# Patient Record
Sex: Male | Born: 1965 | Race: White | Hispanic: No | Marital: Married | Smoking: Former smoker
Health system: Southern US, Community
[De-identification: ages and names within clinical notes are randomized; demographics above are authoritative.]

## PROBLEM LIST (undated history)

## (undated) DIAGNOSIS — I639 Cerebral infarction, unspecified: Secondary | ICD-10-CM

## (undated) DIAGNOSIS — G459 Transient cerebral ischemic attack, unspecified: Secondary | ICD-10-CM

## (undated) DIAGNOSIS — K219 Gastro-esophageal reflux disease without esophagitis: Secondary | ICD-10-CM

## (undated) DIAGNOSIS — E669 Obesity, unspecified: Secondary | ICD-10-CM

## (undated) DIAGNOSIS — T4145XA Adverse effect of unspecified anesthetic, initial encounter: Secondary | ICD-10-CM

## (undated) DIAGNOSIS — F819 Developmental disorder of scholastic skills, unspecified: Secondary | ICD-10-CM

## (undated) DIAGNOSIS — K760 Fatty (change of) liver, not elsewhere classified: Secondary | ICD-10-CM

## (undated) DIAGNOSIS — Z9289 Personal history of other medical treatment: Secondary | ICD-10-CM

## (undated) DIAGNOSIS — F329 Major depressive disorder, single episode, unspecified: Secondary | ICD-10-CM

## (undated) DIAGNOSIS — E119 Type 2 diabetes mellitus without complications: Secondary | ICD-10-CM

## (undated) DIAGNOSIS — I219 Acute myocardial infarction, unspecified: Secondary | ICD-10-CM

## (undated) DIAGNOSIS — D510 Vitamin B12 deficiency anemia due to intrinsic factor deficiency: Secondary | ICD-10-CM

## (undated) DIAGNOSIS — Z9989 Dependence on other enabling machines and devices: Secondary | ICD-10-CM

## (undated) DIAGNOSIS — I739 Peripheral vascular disease, unspecified: Secondary | ICD-10-CM

## (undated) DIAGNOSIS — I10 Essential (primary) hypertension: Secondary | ICD-10-CM

## (undated) DIAGNOSIS — Z8739 Personal history of other diseases of the musculoskeletal system and connective tissue: Secondary | ICD-10-CM

## (undated) DIAGNOSIS — F419 Anxiety disorder, unspecified: Secondary | ICD-10-CM

## (undated) DIAGNOSIS — Z87438 Personal history of other diseases of male genital organs: Secondary | ICD-10-CM

## (undated) DIAGNOSIS — N281 Cyst of kidney, acquired: Secondary | ICD-10-CM

## (undated) DIAGNOSIS — M545 Low back pain, unspecified: Secondary | ICD-10-CM

## (undated) DIAGNOSIS — G8929 Other chronic pain: Secondary | ICD-10-CM

## (undated) DIAGNOSIS — T8859XA Other complications of anesthesia, initial encounter: Secondary | ICD-10-CM

## (undated) DIAGNOSIS — Z87442 Personal history of urinary calculi: Secondary | ICD-10-CM

## (undated) DIAGNOSIS — G43909 Migraine, unspecified, not intractable, without status migrainosus: Secondary | ICD-10-CM

## (undated) DIAGNOSIS — E66812 Obesity, class 2: Secondary | ICD-10-CM

## (undated) DIAGNOSIS — H547 Unspecified visual loss: Secondary | ICD-10-CM

## (undated) DIAGNOSIS — I82409 Acute embolism and thrombosis of unspecified deep veins of unspecified lower extremity: Secondary | ICD-10-CM

## (undated) DIAGNOSIS — E7800 Pure hypercholesterolemia, unspecified: Secondary | ICD-10-CM

## (undated) DIAGNOSIS — G4733 Obstructive sleep apnea (adult) (pediatric): Secondary | ICD-10-CM

## (undated) DIAGNOSIS — F32A Depression, unspecified: Secondary | ICD-10-CM

## (undated) DIAGNOSIS — M1990 Unspecified osteoarthritis, unspecified site: Secondary | ICD-10-CM

## (undated) DIAGNOSIS — I2699 Other pulmonary embolism without acute cor pulmonale: Secondary | ICD-10-CM

## (undated) DIAGNOSIS — I2510 Atherosclerotic heart disease of native coronary artery without angina pectoris: Secondary | ICD-10-CM

## (undated) HISTORY — DX: Essential (primary) hypertension: I10

## (undated) HISTORY — DX: Vitamin B12 deficiency anemia due to intrinsic factor deficiency: D51.0

## (undated) HISTORY — PX: INGUINAL HERNIA REPAIR: SUR1180

## (undated) HISTORY — PX: EYE SURGERY: SHX253

## (undated) HISTORY — DX: Personal history of other diseases of male genital organs: Z87.438

## (undated) HISTORY — DX: Fatty (change of) liver, not elsewhere classified: K76.0

## (undated) HISTORY — DX: Type 2 diabetes mellitus without complications: E11.9

## (undated) HISTORY — PX: UMBILICAL HERNIA REPAIR: SHX196

## (undated) HISTORY — DX: Obesity, unspecified: E66.9

## (undated) HISTORY — DX: Other pulmonary embolism without acute cor pulmonale: I26.99

## (undated) HISTORY — DX: Obesity, class 2: E66.812

## (undated) HISTORY — DX: Unspecified visual loss: H54.7

## (undated) HISTORY — PX: REATTACHMENT HAND: SHX5175

## (undated) HISTORY — DX: Anxiety disorder, unspecified: F41.9

## (undated) HISTORY — PX: CARDIAC CATHETERIZATION: SHX172

## (undated) HISTORY — DX: Cyst of kidney, acquired: N28.1

## (undated) HISTORY — DX: Depression, unspecified: F32.A

## (undated) HISTORY — DX: Developmental disorder of scholastic skills, unspecified: F81.9

## (undated) HISTORY — DX: Pure hypercholesterolemia, unspecified: E78.00

## (undated) HISTORY — DX: Atherosclerotic heart disease of native coronary artery without angina pectoris: I25.10

---

## 1998-05-04 ENCOUNTER — Emergency Department (HOSPITAL_COMMUNITY): Admission: EM | Admit: 1998-05-04 | Discharge: 1998-05-04 | Payer: Self-pay | Admitting: Emergency Medicine

## 1998-08-02 ENCOUNTER — Emergency Department (HOSPITAL_COMMUNITY): Admission: EM | Admit: 1998-08-02 | Discharge: 1998-08-02 | Payer: Self-pay | Admitting: Emergency Medicine

## 1998-08-02 ENCOUNTER — Encounter: Payer: Self-pay | Admitting: Emergency Medicine

## 1998-11-30 ENCOUNTER — Emergency Department (HOSPITAL_COMMUNITY): Admission: EM | Admit: 1998-11-30 | Discharge: 1998-11-30 | Payer: Self-pay | Admitting: Emergency Medicine

## 1998-11-30 ENCOUNTER — Encounter: Payer: Self-pay | Admitting: Emergency Medicine

## 1999-05-16 DIAGNOSIS — Z9289 Personal history of other medical treatment: Secondary | ICD-10-CM

## 1999-05-16 HISTORY — DX: Personal history of other medical treatment: Z92.89

## 2000-01-27 ENCOUNTER — Ambulatory Visit (HOSPITAL_BASED_OUTPATIENT_CLINIC_OR_DEPARTMENT_OTHER): Admission: RE | Admit: 2000-01-27 | Discharge: 2000-01-28 | Payer: Self-pay | Admitting: General Surgery

## 2000-01-27 ENCOUNTER — Encounter (INDEPENDENT_AMBULATORY_CARE_PROVIDER_SITE_OTHER): Payer: Self-pay

## 2001-07-09 ENCOUNTER — Encounter: Payer: Self-pay | Admitting: Orthopedic Surgery

## 2001-07-09 ENCOUNTER — Inpatient Hospital Stay (HOSPITAL_COMMUNITY): Admission: EM | Admit: 2001-07-09 | Discharge: 2001-07-14 | Payer: Self-pay | Admitting: Emergency Medicine

## 2001-07-09 ENCOUNTER — Encounter: Payer: Self-pay | Admitting: Emergency Medicine

## 2001-07-11 ENCOUNTER — Encounter: Payer: Self-pay | Admitting: Orthopedic Surgery

## 2002-05-30 ENCOUNTER — Encounter: Payer: Self-pay | Admitting: Emergency Medicine

## 2002-05-31 ENCOUNTER — Inpatient Hospital Stay (HOSPITAL_COMMUNITY): Admission: EM | Admit: 2002-05-31 | Discharge: 2002-06-03 | Payer: Self-pay | Admitting: Emergency Medicine

## 2004-03-03 ENCOUNTER — Emergency Department (HOSPITAL_COMMUNITY): Admission: EM | Admit: 2004-03-03 | Discharge: 2004-03-03 | Payer: Self-pay | Admitting: Emergency Medicine

## 2004-05-23 ENCOUNTER — Ambulatory Visit: Payer: Self-pay | Admitting: Internal Medicine

## 2004-07-11 ENCOUNTER — Ambulatory Visit: Payer: Self-pay | Admitting: Endocrinology

## 2004-07-12 ENCOUNTER — Ambulatory Visit: Payer: Self-pay | Admitting: Endocrinology

## 2004-07-20 ENCOUNTER — Ambulatory Visit: Payer: Self-pay | Admitting: Endocrinology

## 2005-02-27 ENCOUNTER — Ambulatory Visit: Payer: Self-pay | Admitting: Endocrinology

## 2005-03-06 ENCOUNTER — Ambulatory Visit (HOSPITAL_COMMUNITY): Admission: RE | Admit: 2005-03-06 | Discharge: 2005-03-06 | Payer: Self-pay | Admitting: Endocrinology

## 2005-03-06 ENCOUNTER — Ambulatory Visit: Payer: Self-pay | Admitting: Endocrinology

## 2005-04-07 ENCOUNTER — Ambulatory Visit: Payer: Self-pay | Admitting: Endocrinology

## 2005-05-05 ENCOUNTER — Ambulatory Visit: Payer: Self-pay | Admitting: Endocrinology

## 2005-06-05 ENCOUNTER — Ambulatory Visit: Payer: Self-pay | Admitting: Endocrinology

## 2005-07-06 ENCOUNTER — Ambulatory Visit: Payer: Self-pay | Admitting: Endocrinology

## 2005-08-03 ENCOUNTER — Ambulatory Visit: Payer: Self-pay | Admitting: Endocrinology

## 2005-09-04 ENCOUNTER — Ambulatory Visit: Payer: Self-pay | Admitting: Endocrinology

## 2005-10-04 ENCOUNTER — Ambulatory Visit: Payer: Self-pay | Admitting: Endocrinology

## 2005-10-12 ENCOUNTER — Ambulatory Visit: Payer: Self-pay | Admitting: Cardiology

## 2005-11-06 ENCOUNTER — Ambulatory Visit: Payer: Self-pay | Admitting: Endocrinology

## 2005-12-06 ENCOUNTER — Ambulatory Visit: Payer: Self-pay | Admitting: Endocrinology

## 2006-01-18 ENCOUNTER — Ambulatory Visit: Payer: Self-pay | Admitting: Endocrinology

## 2006-02-19 ENCOUNTER — Ambulatory Visit: Payer: Self-pay | Admitting: Endocrinology

## 2006-03-22 ENCOUNTER — Ambulatory Visit: Payer: Self-pay | Admitting: Endocrinology

## 2006-04-20 ENCOUNTER — Ambulatory Visit: Payer: Self-pay | Admitting: Endocrinology

## 2006-05-03 ENCOUNTER — Emergency Department (HOSPITAL_COMMUNITY): Admission: EM | Admit: 2006-05-03 | Discharge: 2006-05-03 | Payer: Self-pay | Admitting: Emergency Medicine

## 2006-05-04 ENCOUNTER — Ambulatory Visit: Payer: Self-pay | Admitting: Internal Medicine

## 2006-05-21 ENCOUNTER — Ambulatory Visit: Payer: Self-pay | Admitting: Endocrinology

## 2006-05-27 ENCOUNTER — Emergency Department (HOSPITAL_COMMUNITY): Admission: EM | Admit: 2006-05-27 | Discharge: 2006-05-27 | Payer: Self-pay | Admitting: Emergency Medicine

## 2006-06-21 ENCOUNTER — Ambulatory Visit: Payer: Self-pay | Admitting: Endocrinology

## 2006-07-24 ENCOUNTER — Ambulatory Visit: Payer: Self-pay | Admitting: Internal Medicine

## 2006-08-24 ENCOUNTER — Ambulatory Visit: Payer: Self-pay | Admitting: Endocrinology

## 2006-09-21 ENCOUNTER — Ambulatory Visit: Payer: Self-pay | Admitting: Endocrinology

## 2006-10-22 ENCOUNTER — Ambulatory Visit: Payer: Self-pay | Admitting: Endocrinology

## 2006-11-02 ENCOUNTER — Ambulatory Visit: Payer: Self-pay | Admitting: Internal Medicine

## 2006-11-21 ENCOUNTER — Ambulatory Visit: Payer: Self-pay | Admitting: Endocrinology

## 2006-12-05 DIAGNOSIS — I10 Essential (primary) hypertension: Secondary | ICD-10-CM

## 2006-12-05 DIAGNOSIS — I2510 Atherosclerotic heart disease of native coronary artery without angina pectoris: Secondary | ICD-10-CM

## 2006-12-05 DIAGNOSIS — E119 Type 2 diabetes mellitus without complications: Secondary | ICD-10-CM

## 2006-12-05 HISTORY — DX: Essential (primary) hypertension: I10

## 2006-12-05 HISTORY — DX: Type 2 diabetes mellitus without complications: E11.9

## 2006-12-05 HISTORY — DX: Atherosclerotic heart disease of native coronary artery without angina pectoris: I25.10

## 2006-12-21 ENCOUNTER — Ambulatory Visit: Payer: Self-pay | Admitting: Endocrinology

## 2007-01-28 ENCOUNTER — Ambulatory Visit: Payer: Self-pay | Admitting: Endocrinology

## 2007-02-27 ENCOUNTER — Ambulatory Visit: Payer: Self-pay | Admitting: Internal Medicine

## 2007-04-03 ENCOUNTER — Ambulatory Visit: Payer: Self-pay | Admitting: Endocrinology

## 2007-04-03 DIAGNOSIS — D510 Vitamin B12 deficiency anemia due to intrinsic factor deficiency: Secondary | ICD-10-CM

## 2007-04-03 HISTORY — DX: Vitamin B12 deficiency anemia due to intrinsic factor deficiency: D51.0

## 2007-04-12 ENCOUNTER — Emergency Department (HOSPITAL_COMMUNITY): Admission: EM | Admit: 2007-04-12 | Discharge: 2007-04-12 | Payer: Self-pay | Admitting: Emergency Medicine

## 2007-05-02 ENCOUNTER — Ambulatory Visit: Payer: Self-pay | Admitting: Endocrinology

## 2007-06-04 ENCOUNTER — Ambulatory Visit: Payer: Self-pay | Admitting: Endocrinology

## 2007-06-05 ENCOUNTER — Telehealth (INDEPENDENT_AMBULATORY_CARE_PROVIDER_SITE_OTHER): Payer: Self-pay | Admitting: *Deleted

## 2007-07-08 ENCOUNTER — Ambulatory Visit: Payer: Self-pay | Admitting: Endocrinology

## 2007-07-11 ENCOUNTER — Ambulatory Visit: Payer: Self-pay | Admitting: Endocrinology

## 2007-07-14 LAB — CONVERTED CEMR LAB
ALT: 20 units/L (ref 0–53)
AST: 15 units/L (ref 0–37)
Albumin: 3.9 g/dL (ref 3.5–5.2)
Alkaline Phosphatase: 46 units/L (ref 39–117)
BUN: 20 mg/dL (ref 6–23)
Basophils Absolute: 0.1 10*3/uL (ref 0.0–0.1)
Basophils Relative: 1.3 % — ABNORMAL HIGH (ref 0.0–1.0)
Bilirubin Urine: NEGATIVE
Bilirubin, Direct: 0.1 mg/dL (ref 0.0–0.3)
CO2: 30 meq/L (ref 19–32)
Calcium: 9.6 mg/dL (ref 8.4–10.5)
Chloride: 103 meq/L (ref 96–112)
Cholesterol: 105 mg/dL (ref 0–200)
Creatinine, Ser: 0.9 mg/dL (ref 0.4–1.5)
Eosinophils Absolute: 0.1 10*3/uL (ref 0.0–0.6)
Eosinophils Relative: 2.5 % (ref 0.0–5.0)
GFR calc Af Amer: 120 mL/min
GFR calc non Af Amer: 99 mL/min
Glucose, Bld: 114 mg/dL — ABNORMAL HIGH (ref 70–99)
HCT: 40 % (ref 39.0–52.0)
HDL: 42.9 mg/dL (ref >39.0)
Hemoglobin, Urine: NEGATIVE
Hemoglobin: 13.5 g/dL (ref 13.0–17.0)
Hgb A1c MFr Bld: 6.3 % — ABNORMAL HIGH (ref 4.6–6.0)
Ketones, ur: NEGATIVE mg/dL
LDL Cholesterol: 37 mg/dL (ref 0–99)
Leukocytes, UA: NEGATIVE
Lymphocytes Relative: 25.1 % (ref 12.0–46.0)
MCHC: 33.8 g/dL (ref 30.0–36.0)
MCV: 92.8 fL (ref 78.0–100.0)
Monocytes Absolute: 0.5 10*3/uL (ref 0.2–0.7)
Monocytes Relative: 8.6 % (ref 3.0–11.0)
Neutro Abs: 3.3 10*3/uL (ref 1.4–7.7)
Neutrophils Relative %: 62.5 % (ref 43.0–77.0)
Nitrite: NEGATIVE
Platelets: 317 10*3/uL (ref 150–400)
Potassium: 4.3 meq/L (ref 3.5–5.1)
RBC: 4.31 M/uL (ref 4.22–5.81)
RDW: 11.9 % (ref 11.5–14.6)
Sodium: 138 meq/L (ref 135–145)
Specific Gravity, Urine: >=1.03 (ref 1.000–1.03)
TSH: 1.28 microintl units/mL (ref 0.35–5.50)
Total Bilirubin: 1 mg/dL (ref 0.3–1.2)
Total CHOL/HDL Ratio: 2.4
Total Protein, Urine: NEGATIVE mg/dL
Total Protein: 7.1 g/dL (ref 6.0–8.3)
Triglycerides: 124 mg/dL (ref 0–149)
Urine Glucose: NEGATIVE mg/dL
Urobilinogen, UA: 0.2 (ref 0.0–1.0)
VLDL: 25 mg/dL (ref 0–40)
WBC: 5.4 10*3/uL (ref 4.5–10.5)
pH: 5.5 (ref 5.0–8.0)

## 2007-07-17 ENCOUNTER — Ambulatory Visit: Payer: Self-pay | Admitting: Endocrinology

## 2007-07-17 DIAGNOSIS — E7800 Pure hypercholesterolemia, unspecified: Secondary | ICD-10-CM | POA: Insufficient documentation

## 2007-07-17 HISTORY — DX: Pure hypercholesterolemia, unspecified: E78.00

## 2007-08-06 ENCOUNTER — Ambulatory Visit: Payer: Self-pay | Admitting: Endocrinology

## 2007-08-21 ENCOUNTER — Encounter (INDEPENDENT_AMBULATORY_CARE_PROVIDER_SITE_OTHER): Payer: Self-pay | Admitting: *Deleted

## 2007-09-06 ENCOUNTER — Ambulatory Visit: Payer: Self-pay | Admitting: Endocrinology

## 2008-06-12 ENCOUNTER — Ambulatory Visit: Payer: Self-pay | Admitting: Endocrinology

## 2008-06-16 ENCOUNTER — Encounter: Payer: Self-pay | Admitting: Endocrinology

## 2008-07-26 ENCOUNTER — Emergency Department (HOSPITAL_COMMUNITY): Admission: EM | Admit: 2008-07-26 | Discharge: 2008-07-26 | Payer: Self-pay | Admitting: Emergency Medicine

## 2008-09-13 ENCOUNTER — Telehealth: Payer: Self-pay | Admitting: Endocrinology

## 2008-11-26 ENCOUNTER — Telehealth: Payer: Self-pay | Admitting: Endocrinology

## 2008-12-17 ENCOUNTER — Ambulatory Visit: Payer: Self-pay | Admitting: Endocrinology

## 2008-12-17 LAB — CONVERTED CEMR LAB
Creatinine,U: 244.3 mg/dL
Hgb A1c MFr Bld: 6.2 % (ref 4.6–6.5)
Microalb Creat Ratio: 8.2 mg/g (ref 0.0–30.0)
Microalb, Ur: 2 mg/dL — ABNORMAL HIGH (ref 0.0–1.9)

## 2009-01-04 ENCOUNTER — Telehealth (INDEPENDENT_AMBULATORY_CARE_PROVIDER_SITE_OTHER): Payer: Self-pay | Admitting: *Deleted

## 2009-01-21 ENCOUNTER — Ambulatory Visit: Payer: Self-pay | Admitting: Endocrinology

## 2009-02-19 ENCOUNTER — Ambulatory Visit: Payer: Self-pay | Admitting: Endocrinology

## 2009-02-19 ENCOUNTER — Encounter: Payer: Self-pay | Admitting: Endocrinology

## 2009-02-19 DIAGNOSIS — H547 Unspecified visual loss: Secondary | ICD-10-CM | POA: Insufficient documentation

## 2009-02-19 HISTORY — DX: Unspecified visual loss: H54.7

## 2009-03-19 ENCOUNTER — Ambulatory Visit: Payer: Self-pay | Admitting: Endocrinology

## 2009-04-19 ENCOUNTER — Ambulatory Visit: Payer: Self-pay | Admitting: Endocrinology

## 2009-05-21 ENCOUNTER — Ambulatory Visit: Payer: Self-pay | Admitting: Endocrinology

## 2009-06-22 ENCOUNTER — Ambulatory Visit: Payer: Self-pay | Admitting: Endocrinology

## 2009-07-08 ENCOUNTER — Emergency Department (HOSPITAL_BASED_OUTPATIENT_CLINIC_OR_DEPARTMENT_OTHER): Admission: EM | Admit: 2009-07-08 | Discharge: 2009-07-08 | Payer: Self-pay | Admitting: Emergency Medicine

## 2009-07-20 ENCOUNTER — Ambulatory Visit: Payer: Self-pay | Admitting: Endocrinology

## 2009-07-20 ENCOUNTER — Telehealth: Payer: Self-pay | Admitting: Endocrinology

## 2009-08-18 ENCOUNTER — Ambulatory Visit: Payer: Self-pay | Admitting: Endocrinology

## 2009-09-15 ENCOUNTER — Ambulatory Visit: Payer: Self-pay | Admitting: Endocrinology

## 2009-10-15 ENCOUNTER — Ambulatory Visit: Payer: Self-pay | Admitting: Endocrinology

## 2010-04-18 ENCOUNTER — Telehealth (INDEPENDENT_AMBULATORY_CARE_PROVIDER_SITE_OTHER): Payer: Self-pay | Admitting: *Deleted

## 2010-06-08 ENCOUNTER — Encounter: Payer: Self-pay | Admitting: Endocrinology

## 2010-06-08 ENCOUNTER — Emergency Department (HOSPITAL_COMMUNITY)
Admission: EM | Admit: 2010-06-08 | Discharge: 2010-06-08 | Payer: Self-pay | Source: Home / Self Care | Admitting: Emergency Medicine

## 2010-06-08 LAB — GLUCOSE, CAPILLARY: Glucose-Capillary: 101 mg/dL — ABNORMAL HIGH (ref 70–99)

## 2010-06-10 ENCOUNTER — Inpatient Hospital Stay (HOSPITAL_COMMUNITY)
Admission: EM | Admit: 2010-06-10 | Discharge: 2010-06-11 | Payer: Self-pay | Source: Home / Self Care | Attending: Cardiovascular Disease | Admitting: Cardiovascular Disease

## 2010-06-10 ENCOUNTER — Encounter: Payer: Self-pay | Admitting: Endocrinology

## 2010-06-10 ENCOUNTER — Ambulatory Visit
Admission: RE | Admit: 2010-06-10 | Discharge: 2010-06-10 | Payer: Self-pay | Source: Home / Self Care | Attending: Endocrinology | Admitting: Endocrinology

## 2010-06-10 LAB — COMPREHENSIVE METABOLIC PANEL
ALT: 27 U/L (ref 0–53)
AST: 20 U/L (ref 0–37)
Albumin: 3.9 g/dL (ref 3.5–5.2)
Alkaline Phosphatase: 54 U/L (ref 39–117)
BUN: 25 mg/dL — ABNORMAL HIGH (ref 6–23)
CO2: 25 mEq/L (ref 19–32)
Calcium: 9.6 mg/dL (ref 8.4–10.5)
Chloride: 101 mEq/L (ref 96–112)
Creatinine, Ser: 0.99 mg/dL (ref 0.4–1.5)
GFR calc Af Amer: >60 mL/min (ref >60)
GFR calc non Af Amer: >60 mL/min (ref >60)
Glucose, Bld: 106 mg/dL — ABNORMAL HIGH (ref 70–99)
Potassium: 4.5 mEq/L (ref 3.5–5.1)
Sodium: 136 mEq/L (ref 135–145)
Total Bilirubin: 0.7 mg/dL (ref 0.3–1.2)
Total Protein: 7 g/dL (ref 6.0–8.3)

## 2010-06-10 LAB — CBC
HCT: 44.4 % (ref 39.0–52.0)
Hemoglobin: 14.9 g/dL (ref 13.0–17.0)
MCH: 30.9 pg (ref 26.0–34.0)
MCHC: 33.6 g/dL (ref 30.0–36.0)
MCV: 92.1 fL (ref 78.0–100.0)
Platelets: 298 10*3/uL (ref 150–400)
RBC: 4.82 MIL/uL (ref 4.22–5.81)
RDW: 13.2 % (ref 11.5–15.5)
WBC: 6.8 10*3/uL (ref 4.0–10.5)

## 2010-06-10 LAB — CARDIAC PANEL(CRET KIN+CKTOT+MB+TROPI)
CK, MB: 1 ng/mL (ref 0.3–4.0)
Relative Index: INVALID (ref 0.0–2.5)
Total CK: 71 U/L (ref 7–232)
Troponin I: <0.01 ng/mL (ref 0.00–0.06)

## 2010-06-10 LAB — MAGNESIUM: Magnesium: 1.9 mg/dL (ref 1.5–2.5)

## 2010-06-10 LAB — GLUCOSE, CAPILLARY
Glucose-Capillary: 112 mg/dL — ABNORMAL HIGH (ref 70–99)
Glucose-Capillary: 112 mg/dL — ABNORMAL HIGH (ref 70–99)
Glucose-Capillary: 191 mg/dL — ABNORMAL HIGH (ref 70–99)

## 2010-06-10 LAB — TSH: TSH: 2.059 u[IU]/mL (ref 0.350–4.500)

## 2010-06-10 LAB — PROTIME-INR
INR: 0.89 (ref 0.00–1.49)
Prothrombin Time: 12.2 seconds (ref 11.6–15.2)

## 2010-06-10 LAB — TROPONIN I: Troponin I: <0.01 ng/mL (ref 0.00–0.06)

## 2010-06-10 LAB — CK TOTAL AND CKMB (NOT AT ARMC)
CK, MB: 1 ng/mL (ref 0.3–4.0)
Relative Index: INVALID (ref 0.0–2.5)
Total CK: 69 U/L (ref 7–232)

## 2010-06-11 LAB — LIPID PANEL
Cholesterol: 205 mg/dL — ABNORMAL HIGH (ref 0–200)
HDL: 43 mg/dL (ref >39)
LDL Cholesterol: 120 mg/dL — ABNORMAL HIGH (ref 0–99)
Total CHOL/HDL Ratio: 4.8 RATIO
Triglycerides: 211 mg/dL — ABNORMAL HIGH (ref <150)
VLDL: 42 mg/dL — ABNORMAL HIGH (ref 0–40)

## 2010-06-11 LAB — GLUCOSE, CAPILLARY
Glucose-Capillary: 164 mg/dL — ABNORMAL HIGH (ref 70–99)
Glucose-Capillary: 178 mg/dL — ABNORMAL HIGH (ref 70–99)

## 2010-06-11 LAB — CARDIAC PANEL(CRET KIN+CKTOT+MB+TROPI)
CK, MB: 1 ng/mL (ref 0.3–4.0)
Relative Index: INVALID (ref 0.0–2.5)
Total CK: 16 U/L (ref 7–232)
Troponin I: 0.01 ng/mL (ref 0.00–0.06)

## 2010-06-12 LAB — CONVERTED CEMR LAB
ALT: 22 units/L (ref 0–53)
ALT: 26 units/L (ref 0–53)
AST: 17 units/L (ref 0–37)
AST: 20 units/L (ref 0–37)
Albumin: 3.8 g/dL (ref 3.5–5.2)
Albumin: 4 g/dL (ref 3.5–5.2)
Alkaline Phosphatase: 49 units/L (ref 39–117)
Alkaline Phosphatase: 53 units/L (ref 39–117)
BUN: 17 mg/dL (ref 6–23)
BUN: 17 mg/dL (ref 6–23)
Basophils Absolute: 0 10*3/uL (ref 0.0–0.1)
Basophils Absolute: 0.2 10*3/uL — ABNORMAL HIGH (ref 0.0–0.1)
Basophils Relative: 0.5 % (ref 0.0–3.0)
Basophils Relative: 2.2 % (ref 0.0–3.0)
Bilirubin Urine: NEGATIVE
Bilirubin, Direct: 0.1 mg/dL (ref 0.0–0.3)
Bilirubin, Direct: 0.2 mg/dL (ref 0.0–0.3)
CO2: 28 meq/L (ref 19–32)
CO2: 30 meq/L (ref 19–32)
Calcium: 9 mg/dL (ref 8.4–10.5)
Calcium: 9.6 mg/dL (ref 8.4–10.5)
Chloride: 102 meq/L (ref 96–112)
Chloride: 103 meq/L (ref 96–112)
Cholesterol: 124 mg/dL (ref 0–200)
Cholesterol: 129 mg/dL (ref 0–200)
Creatinine, Ser: 0.9 mg/dL (ref 0.4–1.5)
Creatinine, Ser: 0.9 mg/dL (ref 0.4–1.5)
Creatinine,U: 182.1 mg/dL
Creatinine,U: 187.6 mg/dL
Eosinophils Absolute: 0.2 10*3/uL (ref 0.0–0.7)
Eosinophils Absolute: 0.6 10*3/uL (ref 0.0–0.7)
Eosinophils Relative: 2.7 % (ref 0.0–5.0)
Eosinophils Relative: 7.9 % — ABNORMAL HIGH (ref 0.0–5.0)
Folate: 13.6 ng/mL
GFR calc Af Amer: 120 mL/min
GFR calc non Af Amer: 97.82 mL/min (ref >60)
GFR calc non Af Amer: 99 mL/min
Glucose, Bld: 109 mg/dL — ABNORMAL HIGH (ref 70–99)
Glucose, Bld: 127 mg/dL — ABNORMAL HIGH (ref 70–99)
HCT: 37.8 % — ABNORMAL LOW (ref 39.0–52.0)
HCT: 41.6 % (ref 39.0–52.0)
HDL: 42.3 mg/dL (ref >39.00)
HDL: 43.1 mg/dL (ref >39.0)
Hemoglobin, Urine: NEGATIVE
Hemoglobin: 13.1 g/dL (ref 13.0–17.0)
Hemoglobin: 14.5 g/dL (ref 13.0–17.0)
Hgb A1c MFr Bld: 6.1 % (ref 4.6–6.5)
Hgb A1c MFr Bld: 6.1 % — ABNORMAL HIGH (ref 4.6–6.0)
Iron: 82 ug/dL (ref 42–165)
Ketones, ur: NEGATIVE mg/dL
LDL Cholesterol: 44 mg/dL (ref 0–99)
LDL Cholesterol: 51 mg/dL (ref 0–99)
Leukocytes, UA: NEGATIVE
Lymphocytes Relative: 20.6 % (ref 12.0–46.0)
Lymphocytes Relative: 21.2 % (ref 12.0–46.0)
Lymphs Abs: 1.6 10*3/uL (ref 0.7–4.0)
MCHC: 34.7 g/dL (ref 30.0–36.0)
MCHC: 34.9 g/dL (ref 30.0–36.0)
MCV: 91.6 fL (ref 78.0–100.0)
MCV: 93.1 fL (ref 78.0–100.0)
Microalb Creat Ratio: 13.9 mg/g (ref 0.0–30.0)
Microalb Creat Ratio: 6.6 mg/g (ref 0.0–30.0)
Microalb, Ur: 1.2 mg/dL (ref 0.0–1.9)
Microalb, Ur: 2.6 mg/dL — ABNORMAL HIGH (ref 0.0–1.9)
Monocytes Absolute: 0.5 10*3/uL (ref 0.1–1.0)
Monocytes Absolute: 0.5 10*3/uL (ref 0.1–1.0)
Monocytes Relative: 6.8 % (ref 3.0–12.0)
Monocytes Relative: 7.4 % (ref 3.0–12.0)
Neutro Abs: 4.8 10*3/uL (ref 1.4–7.7)
Neutro Abs: 4.8 10*3/uL (ref 1.4–7.7)
Neutrophils Relative %: 63.6 % (ref 43.0–77.0)
Neutrophils Relative %: 67.1 % (ref 43.0–77.0)
Nitrite: NEGATIVE
PSA: 0.38 ng/mL (ref 0.10–4.00)
Platelets: 327 10*3/uL (ref 150–400)
Platelets: 328 10*3/uL (ref 150.0–400.0)
Potassium: 4.3 meq/L (ref 3.5–5.1)
Potassium: 4.3 meq/L (ref 3.5–5.1)
RBC: 4.07 M/uL — ABNORMAL LOW (ref 4.22–5.81)
RBC: 4.54 M/uL (ref 4.22–5.81)
RDW: 12.3 % (ref 11.5–14.6)
RDW: 13.7 % (ref 11.5–14.6)
Saturation Ratios: 22.1 % (ref 20.0–50.0)
Sodium: 137 meq/L (ref 135–145)
Sodium: 138 meq/L (ref 135–145)
Specific Gravity, Urine: >=1.03 (ref 1.000–1.030)
TSH: 0.97 microintl units/mL (ref 0.35–5.50)
TSH: 1.31 microintl units/mL (ref 0.35–5.50)
Total Bilirubin: 0.5 mg/dL (ref 0.3–1.2)
Total Bilirubin: 0.9 mg/dL (ref 0.3–1.2)
Total CHOL/HDL Ratio: 3
Total CHOL/HDL Ratio: 3
Total Protein, Urine: NEGATIVE mg/dL
Total Protein: 6.8 g/dL (ref 6.0–8.3)
Total Protein: 7.2 g/dL (ref 6.0–8.3)
Transferrin: 264.7 mg/dL (ref >=212.0)
Triglycerides: 177 mg/dL — ABNORMAL HIGH (ref 0–149)
Triglycerides: 189 mg/dL — ABNORMAL HIGH (ref 0.0–149.0)
Urine Glucose: NEGATIVE mg/dL
Urobilinogen, UA: 0.2 (ref 0.0–1.0)
VLDL: 35 mg/dL (ref 0–40)
VLDL: 37.8 mg/dL (ref 0.0–40.0)
Vitamin B-12: 639 pg/mL (ref 211–911)
WBC: 7.2 10*3/uL (ref 4.5–10.5)
WBC: 7.6 10*3/uL (ref 4.5–10.5)
pH: 5.5 (ref 5.0–8.0)

## 2010-06-14 NOTE — Assessment & Plan Note (Signed)
Summary: 1 MTH B12--PER PT--SAE---STC  Nurse Visit   Allergies: 1)  ! 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

## 2010-06-14 NOTE — Letter (Signed)
Summary: Primary Care Appointment Letter  [facility]  [address] [place], [place] [address]   Phone: [phone]  Fax: [phone]    08/21/2007 MRN: [id number]  Xavier [name] [address] [place], [place]  [address]  Dear [name],   Your Primary Care Physician  has indicated that:    _______it is time to schedule an appointment.    _______you missed your appointment on______ and need to call and          reschedule.    _______you need to have lab work done.    _______you need to schedule an appointment discuss lab or test results.    ___X____you need to call to reschedule your appointment that is                       scheduled on with Dr. Everardo All on October 15, 2007. Please call the office to reschedule.     Please call our office as soon as possible. Our phone number is [phone]. Please press option 1. Our office is open 8a-12noon and 1p-5p, Monday through Friday.     Thank you,    [facility] Scheduler

## 2010-06-14 NOTE — Assessment & Plan Note (Signed)
Summary: FU/ B-12 INJ /NWS  #   Vital Signs:  Patient profile:   45 year old male Height:      72 inches (182.88 cm) Weight:      279.38 pounds (126.99 kg) O2 Sat:      97 % on Room air Temp:     98.1 degrees F (36.72 degrees C) oral Pulse rate:   75 / minute BP sitting:   124 / 80  (left arm) Cuff size:   large  Vitals Entered By: Josph Macho RMA (August 18, 2009 8:12 AM)  O2 Flow:  Room air CC: Follow-up visit/ B12 today/ pt has questions about continuing Benicar/ pt states he is no longer taking Hyzaar/ CF Is Patient Diabetic? Yes   CC:  Follow-up visit/ B12 today/ pt has questions about continuing Benicar/ pt states he is no longer taking Hyzaar/ CF.  History of Present Illness: pt is here for medicare welllness visit.  he denies memory loss.  he says he is able to perform activities of daily living without assistance.  depression is well-controlled.   Current Medications (verified): 1)  Metformin Hcl 500 Mg Tb24 (Metformin Hcl) .... 2 Tabs Two Times A Day 2)  Actos 45 Mg  Tabs (Pioglitazone Hcl) .... Take 1 By Mouth Qd 3)  Cymbalta 60 Mg  Cpep (Duloxetine Hcl) .... Take 1 By Mouth Qd 4)  Diazepam 5 Mg  Tabs (Diazepam) .... Take 1 Three Times A Day Once Daily 5)  Vitamin B-12 Cr 1000 Mcg  Tbcr (Cyanocobalamin) .... Take 1 Injection Q Month 6)  Januvia 100 Mg  Tabs (Sitagliptin Phosphate) .... Qam 7)  Zocor 80 Mg  Tabs (Simvastatin) .... Qd 8)  Hyzaar 50-12.5 Mg Tabs (Losartan Potassium-Hctz) .... Qd  Allergies (verified): 1)  ! * Niaspan  Past History:  Past Medical History: Last updated: 12/05/2006 Coronary artery disease Diabetes mellitus, type II Hypertension learning disability premcious anemia repair left hand  Family History: Reviewed history from 07/17/2007 and no changes required. father died from lymphoma mother died from copd  Social History: Reviewed history from 07/17/2007 and no changes required. maried disabled due to left wrist injury  at work  Review of Systems  The patient denies fever, weight loss, weight gain, decreased hearing, chest pain, syncope, dyspnea on exertion, prolonged cough, headaches, abdominal pain, melena, hematochezia, severe indigestion/heartburn, hematuria, and suspicious skin lesions.         he has blurry vision (will have cataract surgery soon)  Physical Exam  General:  obese.   Head:  head: no deformity eyes: no periorbital swelling, no proptosis external nose and ears are normal mouth: no lesion seen Ears:  grossly normal hearing.   Neck:  Supple without thyroid enlargement or tenderness.  Lungs:  Clear to auscultation bilaterally. Normal respiratory effort.  Heart:  Regular rate and rhythm without murmurs or gallops noted. Normal S1,S2.   Abdomen:  abdomen is soft, nontender.  no hepatosplenomegaly.   not distended.  no hernia there are inguinal hernia scars Rectal:  normal external and internal exam.  heme neg  Prostate:  Normal size prostate without masses or tenderness.  Msk:  muscle bulk and strength are grossly normal.  no obvious joint swelling.  gait is normal and steady  Pulses:  dorsalis pedis intact bilat.  no carotid bruit Extremities:  no deformity.  no ulcer on the feet.  feet are of normal color and temp.  no edema there is a healed scar at the left  wrist Neurologic:  cn 2-12 grossly intact.   readily moves all 4's.   sensation is intact to touch on the feet  Skin:  normal texture and temp.  no rash.  not diaphoretic  Cervical Nodes:  No significant adenopathy.  Psych:  Alert and cooperative; normal mood and affect; normal attention span and concentration.     Impression & Recommendations:  Problem # 1:  ROUTINE GENERAL MEDICAL EXAM@HEALTH  CARE FACL (ICD-V70.0)  Medications Added to Medication List This Visit: 1)  Losartan Potassium 50 Mg Tabs (Losartan potassium) .Marland Kitchen.. 1 once daily  Other Orders: Vit B12 1000 mcg (J3420) Admin of Therapeutic Inj   intramuscular or subcutaneous ([address]) EKG w/ Interpretation (93000) TLB-Lipid Panel (80061-LIPID) TLB-BMP (Basic Metabolic Panel-BMET) (80048-METABOL) TLB-CBC Platelet - w/Differential (85025-CBCD) TLB-Hepatic/Liver Function Pnl (80076-HEPATIC) TLB-TSH (Thyroid Stimulating Hormone) (84443-TSH) TLB-A1C / Hgb A1C (Glycohemoglobin) (83036-A1C) TLB-Microalbumin/Creat Ratio, Urine (82043-MALB) TLB-PSA (Prostate Specific Antigen) (84153-PSA) TLB-Udip w/ Micro (81001-URINE) Initial Medicare Preventive Exam (U0454)  Patient Instructions: 1)  change hyzaar to losartan 50 mg once daily 2)  here are some samples of benicar 20 mg, to take once daily instead, to save money. 3)  please consider these measures for your health:  minimize alcohol.  do not use tobacco products. keep firearms safely stored.  always use seat belts.  have working smoke alarms in your home.  see the dentist regularly.  never drive under the influence of alcohol or drugs (including prescription drugs).  those with fair skin should take precautions against the sun. 4)  please let me know what your wishes would be, if artificial life support measures should become necessary.  it is critically important to prevent falling down (keep floor areas well-lit, dry, and free of loose objects). 5)  Please schedule a follow-up appointment in 6 months. 6)  tests are being ordered for you today.  a few days after the test(s), please call [phone] to hear your test results. 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)    Route: IM    Site: L deltoid    Exp Date: 11/12    Lot #: [id number]    Mfr: American Regent    Patient tolerated injection without complications    Given by: Josph Macho RMA (August 18, 2009 8:19 AM)  Orders Added: 1)  Vit B12 1000 mcg [J3420] 2)  Admin of Therapeutic Inj   intramuscular or subcutaneous [96372] 3)  EKG w/ Interpretation [93000] 4)  TLB-Lipid Panel [80061-LIPID] 5)  TLB-BMP (Basic Metabolic Panel-BMET) [80048-METABOL] 6)  TLB-CBC Platelet - w/Differential [85025-CBCD] 7)  TLB-Hepatic/Liver Function Pnl [80076-HEPATIC] 8)  TLB-TSH (Thyroid Stimulating Hormone) [84443-TSH] 9)  TLB-A1C / Hgb A1C (Glycohemoglobin) [83036-A1C] 10)  TLB-Microalbumin/Creat Ratio, Urine [82043-MALB] 11)  TLB-PSA (Prostate Specific Antigen) [84153-PSA] 12)  TLB-Udip w/ Micro [81001-URINE] 13)  Initial Medicare Preventive Exam [G0402]

## 2010-06-14 NOTE — Progress Notes (Signed)
Summary: med   Phone Note Outgoing Call   Call placed by: Rock Nephew CMA,  June 05, 2007 1:36 PM Call placed to: [employer] [phone] Summary of Call: Patient dropped off letter from [employer] stating that Diovan HCT HCT 80/12.5 is not covered on formulary. Called [facility] [place] spoke with Melissa who advised that Micardis HCT and Beicar HCT are preferred. Prior auth form also sent by insurance in case MD decide to proceed with prior auth.  Please advise if alt or prior auth shold be used Initial call taken by: Rock Nephew CMA,  June 05, 2007 2:08 PM  Follow-up for Phone Call        it appear already addressed previously per dr Everardo All that pt is not longer on the diovan hct 0 should be on micardis hct Follow-up by: Corwin Levins MD,  June 05, 2007 7:36 PM  Additional Follow-up for Phone Call Additional follow up Details #1::        called pt left msg on machine  to call caleed pt to inform Additional Follow-up by: Shelbie Proctor,  June 06, 2007 11:26 AM    Additional Follow-up for Phone Call Additional follow up Details #2::    pt need a micardis  hct  sent to the pharmacy  [facility]  [phone] .have the diovan medication  been changed per wife if so want the micardis sent to the pharmacy  Follow-up by: Shelbie Proctor,  June 06, 2007 9:47 AM  Additional Follow-up for Phone Call Additional follow up Details #3:: Details for Additional Follow-up Action Taken: I printed the micardis HCT rx.  no prior auth needed   Please be advised I will answer no further questions on this issue - any further questions will be held for dr Everardo All to address on his return Additional Follow-up by: Corwin Levins MD,  June 06, 2007 10:28 AM  New/Updated Medications: MICARDIS HCT 40-12.5 MG  TABS (TELMISARTAN-HCTZ) 1 by mouth qd   Prescriptions: MICARDIS HCT 40-12.5 MG  TABS (TELMISARTAN-HCTZ) 1 by mouth qd  #90 x 1   Entered and Authorized by:   Corwin Levins MD   Signed  by:   Corwin Levins MD on 06/06/2007   Method used:   Print then Give to Patient   RxID:   [id number]

## 2010-06-14 NOTE — Consult Note (Signed)
Summary: [facility]   Imported By: Sherian Rein 03/16/2009 21:30:86  _____________________________________________________________________  External Attachment:    Type:   Image     Comment:   External Document

## 2010-06-14 NOTE — Progress Notes (Signed)
Summary: Rx refill  Phone Note Call from Patient   Caller: Spouse Reason for Call: Refill Medication Summary of Call: Pt's wife called to have pt's Rx refilled: Januvia 100mg  and and Zocor 80 mg. Spoke with pt and informed him that rx were sent to pharmacy Initial call taken by: Beatriz Stallion,  January 04, 2009 2:53 PM

## 2010-06-14 NOTE — Assessment & Plan Note (Signed)
Summary: CPX/B-12 SHOT/AWARE OF FEE/NML   Vital Signs:  Patient Profile:   45 Years Old Male Weight:      274.6 pounds Temp:     97.3 degrees F oral Pulse rate:   68 / minute BP sitting:   123 / 86  (right arm) Cuff size:   large  Vitals Entered By: Orlan Leavens (July 17, 2007 11:07 AM)                 Visit Type:  Follow-up Visit  Chief Complaint:  CPX/ NEED TO DISCUSS VYTORIN MED.  History of Present Illness: feels well in general.  Does not smoke.  Seldom consumes alcohol.  takes 2 dm meds as rx'ed takes micardis hct as rx'ed insurance does not pay for vytorin       Current Allergies: ! * NIASPAN  Past Medical History:    Reviewed history from 12/05/2006 and no changes required:       Coronary artery disease       Diabetes mellitus, type II       Hypertension       learning disability       premcious anemia       repair left hand   Family History:    father died from lymphoma  Social History:    maried    disabled    Review of Systems       The patient complains of weight gain.  The patient denies fever and vision loss.     Physical Exam  General:     obese.   Neck:     no masses, thyromegaly, or abnormal cervical nodes Lungs:     clear to auscultation  Heart:     regular rate and rhythm, S1, S2 without murmurs, rubs, gallops, or clicks Pulses:     dorsalis pedis intact bilat.  no carotid bruit  Extremities:     no deformity.  no ulcer on the feet.  feet are of normal color and temp.  no edema  Neurologic:     sensation is intact to touch on the feet Skin:     normal texture and temp.  no rash.  not diaphoretic  Cervical Nodes:     no significant adenopathy Inguinal Nodes:     no significant adenopathy Psych:     alert and cooperative; normal mood and affect; normal attention span and concentration    Impression & Recommendations:  Problem # 1:  HYPERTENSION (ICD-401.9) well-controlled The following medications were  removed from the medication list:    Diovan Hct 80-12.5 Mg Tabs (Valsartan-hydrochlorothiazide)  His updated medication list for this problem includes:    Micardis Hct 40-12.5 Mg Tabs (Telmisartan-hctz) .Marland Kitchen... 1 by mouth qd   Problem # 2:  DIABETES MELLITUS, TYPE II (ICD-250.00) Assessment: Deteriorated  His updated medication list for this problem includes:    Metformin Hcl 500 Mg Tb24 (Metformin hcl)    Actos 45 Mg Tabs (Pioglitazone hcl) .Marland Kitchen... Take 1 by mouth qd    Januvia 100 Mg Tabs (Sitagliptin phosphate) ..... Qam  Orders: Est. Patient Level IV (60109) EKG w/ Interpretation (93000)   Problem # 3:  HYPERCHOLESTEROLEMIA (ICD-272.0) rx limited by financial factors The following medications were removed from the medication list:    Vytorin 10-80 Mg Tabs (Ezetimibe-simvastatin) .Marland Kitchen... Take 1 by mouth at bedtime need physical before addtional refills  His updated medication list for this problem includes:    Zocor 80 Mg Tabs (Simvastatin) .Marland KitchenMarland KitchenMarland KitchenMarland Kitchen  Qd   Medications Added to Medication List This Visit: 1)  Actos 45 Mg Tabs (Pioglitazone hcl) .... Take 1 by mouth qd 2)  Cymbalta 60 Mg Cpep (Duloxetine hcl) .... Take 1 by mouth qd 3)  Diazepam 5 Mg Tabs (Diazepam) .... Take 1 three times a day once daily 4)  Vitamin B-12 Cr 1000 Mcg Tbcr (Cyanocobalamin) .... Take 1 injection q month 5)  Januvia 100 Mg Tabs (Sitagliptin phosphate) .... Qam 6)  Zocor 80 Mg Tabs (Simvastatin) .... Qd   Patient Instructions: 1)  add januvia 100/d 2)  change vytorin to zocor 80/d 3)  ret 3 mos    Prescriptions: ZOCOR 80 MG  TABS (SIMVASTATIN) qd  #30 x 11   Entered and Authorized by:   Minus Breeding MD   Signed by:   Minus Breeding MD on 07/17/2007   Method used:   Elect[facility] to ...       [facility] [address] [phone]*       [address]       [place], [place]  [address]       Ph: [phone] or [phone]       Fax: [phone]   RxID:    [id number] JANUVIA 100 MG  TABS (SITAGLIPTIN PHOSPHATE) qam  #30 x 11   Entered and Authorized by:   Minus Breeding MD   Signed by:   Minus Breeding MD on 07/17/2007   Method used:   Electronically sent to ...       [facility] [address] [phone]*       [address]       [place], [place]  [address]       Ph: [phone] or [phone]       Fax: [phone]   RxID:   [id number]  ]

## 2010-06-14 NOTE — Medication Information (Signed)
Summary: Prior Authorization for Januvia/[employer]  Prior Authorization for Januvia/[employer]   Imported By: Esmeralda Links D'jimraou 06/18/2008 09:47:10  _____________________________________________________________________  External Attachment:    Type:   Image     Comment:   External Document

## 2010-06-14 NOTE — H&P (Signed)
NAMEDAVIED, NOCITO                 ACCOUNT NO.:  [id number]  MEDICAL RECORD NO.:  [id number]          PATIENT TYPE:  INP  LOCATION:  4740                         FACILITY:  [facility]  PHYSICIAN:  Verne Carrow, MDDATE OF BIRTH:  1966/01/24  DATE OF ADMISSION:  06/10/2010 DATE OF DISCHARGE:                             HISTORY & PHYSICAL   PRIMARY CARE PHYSICIAN:  Sean A. Everardo All, MD  HISTORY OF PRESENT ILLNESS:  Mr. Bromell is a pleasant 44 year old overweight white male with a history of diabetes mellitus, hyperlipidemia and hypertension who has mild coronary artery disease by calf in 2004.  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  He presented to see Dr. Everardo All today and had complaints of worsening shortness of breath with dyspnea on exertion and chest pain.  He had a severe episode of chest pain yesterday that wassubsternal and noted to be a tightness associated with shortness of breath, diaphoresis, and nausea.  The episode lasted for approximately 90 minutes.  He does note several episodes of chest pain per week over the last few months.  PAST MEDICAL HISTORY: 1. Diabetes mellitus x6 years. 2. Hypertension. 3. Hyperlipidemia. 4. Mild coronary artery disease by cath in 2004. 5. Pernicious anemia.  PAST SURGICAL HISTORY: 1. Hernia surgery. 2. Left hand trauma, status post amputation with reattachment.  ALLERGIES:  NIASPAN.  HOME MEDICATIONS: 1. Metformin 1000 mg b.i.d. 2. Actos 45 mg once daily. 3. Cymbalta 60 mg once daily. 4. Januvia 100 mg once daily. 5. Zocor 80 mg once daily. 6. Losartan 50 mg once daily. 7. Benicar 20 mg once daily. 8. Vitamin B6 100 mg once daily. 9. Aspirin 81 mg p.o. once daily.  SOCIAL HISTORY:  He is married, has two sons and is on disability because of this hand injury.  He denies use of tobacco, alcohol or illicit drugs.  FAMILY  HISTORY:  The patient's father had coronary artery disease and a prior bypass surgery.  His mother died from COPD.  REVIEW OF SYSTEMS:  As stated in the history of present illness and is otherwise negative.  PHYSICAL EXAMINATION:  VITAL SIGNS:  Temperature afebrile, pulse 76 and regular, respirations 12 and unlabored, blood pressure 134/76. GENERAL:  He is a pleasant overweight Caucasian male in no acute distress.  He is alert and oriented x3. NECK:  No JVD.  No carotid bruits.  No thyromegaly.  No lymphadenopathy. SKIN:  Warm and dry. MUSCULOSKELETAL:  Moves all extremities equally. PSYCHIATRIC:  Mood and affect are appropriate. HEENT:  Normal. LUNGS:  Clear to auscultation bilaterally without wheezes, rhonchi, or crackles noted. CARDIOVASCULAR:  Regular rate and rhythm without murmurs, gallops, or rubs noted. ABDOMEN:  Soft, nontender.  Bowel sounds are present. EXTREMITIES:  No evidence of edema.  Pulses are 2+ in all extremities.  DIAGNOSTIC STUDIES: 1. No laboratory values are available at this time. 2. A 12-lead EKG shows normal sinus rhythm with a rate of 81 beats per     minute.  In comparison to the old EKG, there are  now a new T-wave     inversion in the lateral and anterolateral leads.  ASSESSMENT AND PLAN:  This is a 45 year old obese white male with a history of diabetes mellitus, hypertension, hyperlipidemia and coronary artery disease by catheterization in 2004, who was admitted with chest pain is concerning for unstable angina.  In regards to his chest pain, we are going to admit him to the telemetry bed.  I have discussed a diagnostic left heart catheterization with the patient.  He is in agreement to proceed with a left heart catheterization.  Risk and benefits have been reviewed.  I would recommend going from the right groin as the patient has trauma to his left hand in the past and I would not risk functionality of his right hand with a catheterization  through the right radial artery.  We will check a metabolic profile, CBC, liver function tests, coagulation studies, and a TSH now.  We will cycle cardiac enzymes.  The plan would be for left heart catheterization later today.     Verne Carrow, MD     CM/MEDQ  D:  06/10/2010  T:  06/11/2010  Job:  [id number]  cc:   Gregary Signs A. Everardo All, MD  Electronically Signed by Verne Carrow MD on 06/14/2010 07:51:28 AM

## 2010-06-14 NOTE — Progress Notes (Signed)
Summary: BENICAR  Phone Note Call from Patient   Summary of Call: Pt was given samples of Benicar hct 20/12.5 at last office visit. He is req rx for same, [facility]. Also, pt does not know when is is to f/u again, please advise.  Initial call taken by: Lamar Sprinkles, CMA,  July 20, 2009 8:42 AM  Follow-up for Phone Call        f/u ov is due.  medicare now pays for "wellness"--why don't you make appointment for this soon?  i hope to give you "benicar" samples then. Follow-up by: Minus Breeding MD,  July 20, 2009 9:40 AM  Additional Follow-up for Phone Call Additional follow up Details #1::        pt informed, transferred to sch appt. Additional Follow-up by: Margaret Pyle, CMA,  July 20, 2009 10:32 AM

## 2010-06-14 NOTE — Miscellaneous (Signed)
  Medications Added MICARDIS HCT 40-12.5 MG  TABS (TELMISARTAN-HCTZ) qd       Clinical Lists Changes  Medications: Added new medication of MICARDIS HCT 40-12.5 MG  TABS (TELMISARTAN-HCTZ) qd changed diovan-hct to micardis-hct for insurance

## 2010-06-14 NOTE — Assessment & Plan Note (Signed)
Summary: B-12   SAE  /NWS  Nurse Visit    Prior Medications: MICARDIS HCT 40-12.5 MG  TABS (TELMISARTAN-HCTZ) qd DIOVAN HCT 80-12.5 MG TABS (VALSARTAN-HYDROCHLOROTHIAZIDE)  METFORMIN HCL 500 MG TB24 (METFORMIN HCL)  Current Allergies: ! 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

## 2010-06-14 NOTE — Assessment & Plan Note (Signed)
Summary: B12/SAE/CD  Nurse Visit   Allergies: 1)  ! 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

## 2010-06-14 NOTE — Assessment & Plan Note (Signed)
Summary: 1 MTH INJ-STC  Nurse Visit    Prior Medications: MICARDIS HCT 40-12.5 MG  TABS (TELMISARTAN-HCTZ) 1 by mouth qd DIOVAN HCT 80-12.5 MG TABS (VALSARTAN-HYDROCHLOROTHIAZIDE)  METFORMIN HCL 500 MG TB24 (METFORMIN HCL)  VYTORIN 10-80 MG  TABS (EZETIMIBE-SIMVASTATIN) TAKE 1 by mouth at bedtime NEED PHYSICAL BEFORE ADDTIONAL REFILLS Current Allergies: ! 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

## 2010-06-14 NOTE — Progress Notes (Signed)
----   Converted from flag ---- ---- 09/07/2008 12:43 PM, Ami Bullins CMA wrote: pt's number is disconnected.  ---- 09/07/2008 8:31 AM, Minus Breeding MD wrote: please advise pt that f/u ov is due ------------------------------

## 2010-06-14 NOTE — Progress Notes (Signed)
  Phone Note Other Incoming   Request: Send information Summary of Call: Request for records received from [facility]. Request forwarded to [employer].     

## 2010-06-14 NOTE — Assessment & Plan Note (Signed)
Summary: B-12 /SAE/NWS   Nurse Visit    Current Allergies: ! 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

## 2010-06-14 NOTE — Assessment & Plan Note (Signed)
Summary: b12/sae/cd  Nurse Visit   Allergies: 1)  ! 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 Patient Level I [09811]

## 2010-06-14 NOTE — Assessment & Plan Note (Signed)
Summary: B12-SAE-STC  Nurse Visit    Prior Medications: MICARDIS HCT 40-12.5 MG  TABS (TELMISARTAN-HCTZ) 1 by mouth qd METFORMIN HCL 500 MG TB24 (METFORMIN HCL)  ACTOS 45 MG  TABS (PIOGLITAZONE HCL) TAKE 1 by mouth QD CYMBALTA 60 MG  CPEP (DULOXETINE HCL) TAKE 1 by mouth QD DIAZEPAM 5 MG  TABS (DIAZEPAM) TAKE 1 three times a day once daily VITAMIN B-12 CR 1000 MCG  TBCR (CYANOCOBALAMIN) TAKE 1 INJECTION Q MONTH JANUVIA 100 MG  TABS (SITAGLIPTIN PHOSPHATE) qam ZOCOR 80 MG  TABS (SIMVASTATIN) qd Current Allergies: ! 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

## 2010-06-14 NOTE — Assessment & Plan Note (Signed)
Summary: b12/sae/cd  Nurse Visit   Allergies: 1)  ! 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

## 2010-06-14 NOTE — Assessment & Plan Note (Signed)
Summary: FU  $50 MEDS    STC   Vital Signs:  Patient Profile:   45 Years Old Male Weight:      267.2 pounds O2 Sat:      97 % O2 treatment:    Room Air Temp:     97.5 degrees F oral Pulse rate:   82 / minute BP sitting:   136 / 88  (right arm) Cuff size:   large  Pt. in pain?   no  Vitals Entered By: Orlan Leavens (June 12, 2008 7:58 AM)                  Chief Complaint:  FOLLOW-UP ON MEDS.  History of Present Illness: prefers generic of micardis-hct. takes 3 dm meds as rx'ed. doesn't take b-12 injections as rx'ed    Prior Medications Reviewed Using: List Brought by Patient  Prior Medication List:  MICARDIS HCT 40-12.5 MG  TABS (TELMISARTAN-HCTZ) 1 by mouth qd METFORMIN HCL 500 MG TB24 (METFORMIN HCL)  ACTOS 45 MG  TABS (PIOGLITAZONE HCL) TAKE 1 by mouth QD CYMBALTA 60 MG  CPEP (DULOXETINE HCL) TAKE 1 by mouth QD DIAZEPAM 5 MG  TABS (DIAZEPAM) TAKE 1 three times a day once daily VITAMIN B-12 CR 1000 MCG  TBCR (CYANOCOBALAMIN) TAKE 1 INJECTION Q MONTH JANUVIA 100 MG  TABS (SITAGLIPTIN PHOSPHATE) qam ZOCOR 80 MG  TABS (SIMVASTATIN) qd   Updated Prior Medication List: MICARDIS HCT 40-12.5 MG  TABS (TELMISARTAN-HCTZ) 1 by mouth qd METFORMIN HCL 500 MG TB24 (METFORMIN HCL)  ACTOS 45 MG  TABS (PIOGLITAZONE HCL) TAKE 1 by mouth QD CYMBALTA 60 MG  CPEP (DULOXETINE HCL) TAKE 1 by mouth QD DIAZEPAM 5 MG  TABS (DIAZEPAM) TAKE 1 three times a day once daily VITAMIN B-12 CR 1000 MCG  TBCR (CYANOCOBALAMIN) TAKE 1 INJECTION Q MONTH JANUVIA 100 MG  TABS (SITAGLIPTIN PHOSPHATE) qam ZOCOR 80 MG  TABS (SIMVASTATIN) qd  Current Allergies (reviewed today): ! * NIASPAN  Past Medical History:    Reviewed history from 12/05/2006 and no changes required:       Coronary artery disease       Diabetes mellitus, type II       Hypertension       learning disability       premcious anemia       repair left hand     Review of Systems  The patient denies weight loss and weight  gain.     Physical Exam  General:     obese.   Pulses:     dorsalis pedis intact bilat.   Extremities:     no deformity.  no ulcer on the feet.  feet are of normal color and temp.  no edema  Neurologic:     sensation is intact to touch on the feet  Additional Exam:     CHOLESTEROL               129 mg/dL                   9-147        TRIGLYCERIDES        [H]  177 mg/dL                   8-295    HDL                       62.1 mg/dL                  >  39.0   VLDL CHOLESTEROL          35 mg/dl                    1-61   LDL CHOLESTEROL           51 mg/dl                    0-96     WHITE CELL COUNT          7.2 K/uL                    4.5-10.5   RED CELL COUNT            4.54 Mil/uL                 4.22-5.81   HEMOGLOBIN                14.5 g/dL                   04.5-40.9   HEMATOCRIT                41.6 %                      39.0-52.0     PLATELET COUNT            327 K/uL                    150-400  SODIUM                    138 mEq/L                   135-145   POTASSIUM                 4.3 mEq/L                   3.5-5.1   CHLORIDE                  102 mEq/L                   96-112   CARBON DIOXIDE            30 mEq/L                    19-32   GLUCOSE              [H]  127 mg/dL                   81-19   BUN                       17 mg/dL                    1-47   CREATININE                0.9 mg/dL                   8.2-9.5   CALCIUM                   9.6 mg/dL                   6.2-13.0    TOTAL BILIRUBIN  0.9 mg/dL                   4.4-0.1   DIRECT BILIRUBIN          0.2 mg/dL                   0.2-7.2   ALKALINE PHOSPHATASE      53 U/L                      39-117   SGOT (AST)                17 U/L                      0-37   SGPT (ALT)                22 U/L                      0-53   TOTAL PROTEIN             7.2 g/dL                    5.3-6.6   ALBUMIN                   4.0 g/dL                    4.4-0.3  A1C%                 [H]  6.1 %                Impression & Recommendations:  Problem # 1:  DIABETES MELLITUS, TYPE II (ICD-250.00) well-controlled   Problem # 2:  HYPERCHOLESTEROLEMIA (ICD-272.0) well-controlled   Problem # 3:  HYPERTENSION (ICD-401.9) pt c/o the cost of meds  Problem # 4:  ANEMIA, 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 Qd  Other Orders: TLB-Hepatic/Liver Function Pnl (80076-HEPATIC)   Patient Instructions: 1)  change micardis-hct to hyzaar 50/12.5 1/d, as hyzaar will be generic soon 2)  i gave samples of benicar-hct 20/12.5 to take for now 3)  same rx for dm and lipids 4)  take b-12 as rx'ed 5)  ret 30 days   Prescriptions: JANUVIA 100 MG  TABS (SITAGLIPTIN PHOSPHATE) qam  #30 x 11   Entered and Authorized by:   Minus Breeding MD   Signed by:   Minus Breeding MD on 06/12/2008   Method used:   [facility]        [facility] [address] [phone]* (retail)       [address]       [place], [place]  [address]       Ph: [phone] or [phone]       Fax: [phone]   RxID:   [id number] ACTOS 45 MG  TABS (PIOGLITAZONE HCL) TAKE 1 by mouth QD  #30 x 11   Entered and Authorized by:   Minus Breeding MD   Signed by:   Minus Breeding MD on 06/12/2008   Method used:   [facility]ronically to        [facility] [address] [phone]* (retail)       [address]  [place], [place]  [address]       Ph: [phone] or [phone]       Fax: [phone]   RxID:   [id number]

## 2010-06-14 NOTE — Assessment & Plan Note (Signed)
Summary: FU  PER WIFE MED OUT  $50--STC   Vital Signs:  Patient profile:   45 year old male Height:      72 inches Weight:      261 pounds BMI:     35.53 O2 Sat:      98 % on Room air Temp:     97.2 degrees F oral Pulse rate:   70 / minute BP sitting:   118 / 78  (left arm) Cuff size:   large  Vitals Entered By: Ami Bullins CMA (December 17, 2008 8:27 AM)  O2 Flow:  Room air  CC: pt here for follow up office visit. Pt needs refill on actos/ ab   CC:  pt here for follow up office visit. Pt needs refill on actos/ ab.  History of Present Illness: no cbg record, but states cbg's are well-controlled. he takes hyzaar as rx'ed.  Current Medications (verified): 1)  Metformin Hcl 500 Mg Tb24 (Metformin Hcl) .... 2 Tabs Two Times A Day 2)  Actos 45 Mg  Tabs (Pioglitazone Hcl) .... Take 1 By Mouth Qd 3)  Cymbalta 60 Mg  Cpep (Duloxetine Hcl) .... Take 1 By Mouth Qd 4)  Diazepam 5 Mg  Tabs (Diazepam) .... Take 1 Three Times A Day Once Daily 5)  Vitamin B-12 Cr 1000 Mcg  Tbcr (Cyanocobalamin) .... Take 1 Injection Q Month 6)  Januvia 100 Mg  Tabs (Sitagliptin Phosphate) .... Qam 7)  Zocor 80 Mg  Tabs (Simvastatin) .... Qd 8)  Hyzaar 50-12.5 Mg Tabs (Losartan Potassium-Hctz) .... Qd  Allergies (verified): 1)  ! * Niaspan  Past History:  Past Medical History: Last updated: 12/05/2006 Coronary artery disease Diabetes mellitus, type II Hypertension learning disability premcious anemia repair left hand  Review of Systems  The patient denies weight loss and weight gain.    Physical Exam  General:  obese.  no distress  Neck:  Supple without thyroid enlargement or tenderness. No cervical lymphadenopathy, neck masses or tracheal deviation.  Lungs:  Clear to auscultation bilaterally. Normal respiratory effort.  Heart:  Regular rate and rhythm without murmurs or gallops noted. Normal S1,S2.   Additional Exam:  Hemoglobin A1C            6.2 %                        4.6-6.5 Microalbumin Ratio        8.2 mg/g    Impression & Recommendations:  Problem # 1:  DIABETES MELLITUS, TYPE II (ICD-250.00) well-controlled  Problem # 2:  HYPERTENSION (ICD-401.9) well-controlled  Other Orders: Tdap => 50yrs IM ([address]) Admin 1st Vaccine (60[address]) Admin of Therapeutic Inj  intramuscular or subcutaneous (09811) Vit B12 1000 mcg (J3420) TLB-A1C / Hgb A1C (Glycohemoglobin) (83036-A1C) TLB-Microalbumin/Creat Ratio, Urine (82043-MALB) Est. Patient Level III (91478)  Patient Instructions: 1)  we discussed the importance of diet and exercise therapy and the risks of diabetes.  you should see an eye doctor every year. 2)  it is very important to keep good control of blood pressure and cholesterol, especially in those with diabetes.  please discuss these with your doctor.  you should take an aspirin every day, unless you have been advised by a doctor not to. 3)  check your blood sugar 1 times a day.  vary the time of day when you check, between before the 3 meals, and at bedtime.  also check if you have symptoms of your blood  sugar being too high or too low.  please keep a record of the readings and bring it to your next appointment here.  please call us sooner if you are having low blood sugar episodes. 4)  same medications. 5)  tests are being ordered for you today.  a few days after the test(s), please call [phone] to hear your test results.  this is very important to do because the results may change the instructions you see here 6)  (update: i left message on phone-tree:  rx as we discussed) Prescriptions: ACTOS 45 MG  TABS (PIOGLITAZONE HCL) TAKE 1 by mouth QD  #30 x 11   Entered and Authorized by:   Minus Breeding MD   Signed by:   Minus Breeding MD on 12/17/2008   Method used:   [facility]ronically to        [facility] [address] [phone]* (retail)       [address]       [place], [place]  [address]       Ph: [phone] or [phone]        Fax: [phone]   RxID:   [id number]    Immunizations Administered:  Tetanus Vaccine:    Vaccine Type: Tdap    Site: right deltoid    Mfr: GlaxoSmithKline    Dose: 0.5 ml    Route: IM    Given by: Ami Bullins CMA    Exp. Date: 03/18/2011    Lot #: [id number]    VIS given: 04/02/07 version given December 17, 2008.    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 Patient Level III [63016]

## 2010-06-14 NOTE — Assessment & Plan Note (Signed)
Summary: B12/SAE/CD  Nurse Visit   Allergies: 1)  ! 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

## 2010-06-14 NOTE — Progress Notes (Signed)
Summary: refills  Phone Note Call from Patient Call back at [phone]   Caller: Spouse-Lisa Summary of Call: requesting refill of actos, januvia and simvastain, pt has appt on aug 6th Initial call taken by: Migdalia Dk,  November 26, 2008 10:40 AM  Follow-up for Phone Call        sent Follow-up by: Minus Breeding MD,  November 26, 2008 12:22 PM  Additional Follow-up for Phone Call Additional follow up Details #1::        Notified wife rx sent to pharm Additional Follow-up by: Orlan Leavens,  November 26, 2008 1:45 PM    Prescriptions: ZOCOR 80 MG  TABS (SIMVASTATIN) qd  #30 Tablet x 0   Entered and Authorized by:   Minus Breeding MD   Signed by:   Minus Breeding MD on 11/26/2008   Method used:   Electronically to        [facility] [address] [phone]* (retail)       [address]       [place], [place]  [address]       Ph: [phone] or [phone]       Fax: [phone]   RxID:   [id number] JANUVIA 100 MG  TABS (SITAGLIPTIN PHOSPHATE) qam  #30 x 0   Entered and Authorized by:   Minus Breeding MD   Signed by:   Minus Breeding MD on 11/26/2008   Method used:   Electronically to        [facility] [address] [phone]* (retail)       [address]       [place], [place]  [address]       Ph: [phone] or [phone]       Fax: [phone]   RxID:   [id number] ACTOS 45 MG  TABS (PIOGLITAZONE HCL) TAKE 1 by mouth QD  #30 x 0   Entered and Authorized by:   Minus Breeding MD   Signed by:   Minus Breeding MD on 11/26/2008   Method used:   Electronically to        [facility] [address] [phone]* (retail)       [address]       [place], [place]  [address]       Ph: [phone] or [phone]       Fax: [phone]   RxID:   [id number]

## 2010-06-14 NOTE — Assessment & Plan Note (Signed)
Summary: B-12   SAE   /NWS  Medications Added DIOVAN HCT 80-12.5 MG TABS (VALSARTAN-HYDROCHLOROTHIAZIDE)  METFORMIN HCL 500 MG TB24 (METFORMIN HCL)        Nurse Visit    Prior Medications: MICARDIS HCT 40-12.5 MG  TABS (TELMISARTAN-HCTZ) qd DIOVAN HCT 80-12.5 MG TABS (VALSARTAN-HYDROCHLOROTHIAZIDE)  METFORMIN HCL 500 MG TB24 (METFORMIN HCL)  Current Allergies: ! 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

## 2010-06-14 NOTE — Assessment & Plan Note (Signed)
Summary: B12/SAE/CD-PER PT RS-STC  Nurse Visit   Vital Signs:  Patient profile:   45 year old male Height:      72 inches  Vitals Entered By: Ami Bullins CMA (January 21, 2009 8:49 AM)  Allergies: 1)  ! 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

## 2010-06-14 NOTE — Assessment & Plan Note (Signed)
Summary: left eye pain/sae/cd   Vital Signs:  Patient profile:   45 year old male Height:      72 inches (182.88 cm) Weight:      268.25 pounds (121.93 kg) O2 Sat:      97 % on Room air Temp:     96.9 degrees F (36.06 degrees C) oral Pulse rate:   68 / minute BP sitting:   118 / 84  (left arm)  Vitals Entered By: Josph Macho CMA (February 19, 2009 9:19 AM)  O2 Flow:  Room air CC: Left eye blurry/ pt wants his B12 injection/ CF Is Patient Diabetic? Yes   CC:  Left eye blurry/ pt wants his B12 injection/ CF.  History of Present Illness: pt states 1 week of decreased vision from the left eye.  the right eye has normal vision.  says he does not remember the last time he saw an eye dr.  he does not have floaters or black spots.  Current Medications (verified): 1)  Metformin Hcl 500 Mg Tb24 (Metformin Hcl) .... 2 Tabs Two Times A Day 2)  Actos 45 Mg  Tabs (Pioglitazone Hcl) .... Take 1 By Mouth Qd 3)  Cymbalta 60 Mg  Cpep (Duloxetine Hcl) .... Take 1 By Mouth Qd 4)  Diazepam 5 Mg  Tabs (Diazepam) .... Take 1 Three Times A Day Once Daily 5)  Vitamin B-12 Cr 1000 Mcg  Tbcr (Cyanocobalamin) .... Take 1 Injection Q Month 6)  Januvia 100 Mg  Tabs (Sitagliptin Phosphate) .... Qam 7)  Zocor 80 Mg  Tabs (Simvastatin) .... Qd 8)  Hyzaar 50-12.5 Mg Tabs (Losartan Potassium-Hctz) .... Qd  Allergies (verified): 1)  ! * Niaspan  Past History:  Past Medical History: Last updated: 12/05/2006 Coronary artery disease Diabetes mellitus, type II Hypertension learning disability premcious anemia repair left hand  Review of Systems  The patient denies fever and headaches.         the right eye is not painful.  no diplopia  Physical Exam  General:  obese.  no distress  Eyes:  right eye:  Conjunctiva and sclera clear without injection, icterus or proptosis.  right fundoscopy is normal   Impression & Recommendations:  Problem # 1:  VISUAL ACUITY, DECREASED, RIGHT EYE  (ICD-369.9) uncertain etiology  Other Orders: Vit B12 1000 mcg (J3420) Admin of Therapeutic Inj  intramuscular or subcutaneous ([address]) Ophthalmology Referral (Ophthalmology) Est. 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 Patient Level III [09811]

## 2010-06-14 NOTE — Assessment & Plan Note (Signed)
Summary: B12 / SAE / NWS  Nurse Visit   Allergies: 1)  ! 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

## 2010-06-14 NOTE — Assessment & Plan Note (Signed)
Summary: 1 MTH B12 -SAE-STC  Nurse Visit    Prior Medications: MICARDIS HCT 40-12.5 MG  TABS (TELMISARTAN-HCTZ) 1 by mouth qd METFORMIN HCL 500 MG TB24 (METFORMIN HCL)  ACTOS 45 MG  TABS (PIOGLITAZONE HCL) TAKE 1 by mouth QD CYMBALTA 60 MG  CPEP (DULOXETINE HCL) TAKE 1 by mouth QD DIAZEPAM 5 MG  TABS (DIAZEPAM) TAKE 1 three times a day once daily VITAMIN B-12 CR 1000 MCG  TBCR (CYANOCOBALAMIN) TAKE 1 INJECTION Q MONTH JANUVIA 100 MG  TABS (SITAGLIPTIN PHOSPHATE) qam ZOCOR 80 MG  TABS (SIMVASTATIN) qd Current Allergies: ! 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

## 2010-06-16 NOTE — Procedures (Signed)
  [name], Xavier [name]                 ACCOUNT NO.:  [id number]  MEDICAL RECORD NO.:  [id number]           PATIENT TYPE:  LOCATION:                                 FACILITY:  PHYSICIAN:  Peter C. Eden Emms, MD, FACCDATE OF BIRTH:  June 21, 1965  DATE OF PROCEDURE: DATE OF DISCHARGE:                           CARDIAC CATHETERIZATION   This is a 45 year old patient with chest pain.  Cine-catheterization was done through right femoral artery.  A JL-4 catheter was used to engage the left main, JR-4 catheter was then used to engage the right.  Previous caths indicated difficulty engaging the left main; however, we were able to manipulate a standard catheter to engage it.  Left main coronary artery was normal.  Left anterior descending artery was normal in its proximal and midportion.  The distal vessel had a long area of tubular 30% stenosis. LAD was a large artery and wrapped the apex.  First diagonal branch was small with 20% multiple discrete lesions.  Second and third diagonal branch were larger and normal.  Circumflex coronary artery was nondominant and normal.  There was one large obtuse marginal branch which was normal.  The right coronary artery was dominant and normal.  RAO Ventriculography:  RAO ventriculography showed hyperdynamic LV function.  The EF was 65%.  There was no gradient across the aortic valve and no MR.  Aortic pressure was 140/88, LV pressure was 140/7 with a post A-wave EDP of 12.  IMPRESSION:  The patient's chest pain appears to be noncardiac in etiology, he has no significant coronary disease.  He tolerated the procedure well.  Due to the late hour, he will be admitted overnight and discharged in the morning so long as his groin heals well.     Xavier [name]. Eden Emms, MD, [facility]     PCN/MEDQ  D:  06/10/2010  T:  06/11/2010  Job:  [id number]  Electronically Signed by [name] MD [facility] on 06/16/2010 10:38:20 PM

## 2010-06-16 NOTE — Assessment & Plan Note (Signed)
Summary: FU ON BP / LABS? /NWS   Vital Signs:  Patient profile:   45 year old male Height:      72 inches (182.88 cm) Weight:      279 pounds (126.82 kg) BMI:     37.98 O2 Sat:      96 % on Room air Temp:     98.0 degrees F (36.67 degrees C) oral Pulse rate:   88 / minute BP sitting:   112 / 78  (left arm) Cuff size:   large  Vitals Entered By: Brenton Grills CMA Duncan Dull) (June 10, 2010 8:37 AM)  O2 Flow:  Room air CC: Elevated BP over past couple of weeks (wed. 220/117)/B12 shot today/aj Is Patient Diabetic? Yes Comments pt is no longer taking Losartan Postassium   CC:  Elevated BP over past couple of weeks (wed. 220/117)/B12 shot today/aj.  History of Present Illness: pt was seen in er for few weeks of severe headache, worst at the bifronal area, and assoc htn.  non-pulsatile quality.  no help with tylenol.  he has never had headache before.   no cbg record, but states cbg's are well-controlled.  he is overdue for f/u appt.   at the end of the ov, pt reports chest pain, sob, and heartburn.  chest pain has been intermittent, x a few months, with assoc sob.  last episode was yeaterday, x 90 minutes.  Current Medications (verified): 1)  Metformin Hcl 500 Mg Tb24 (Metformin Hcl) .... 2 Tabs Two Times A Day 2)  Actos 45 Mg  Tabs (Pioglitazone Hcl) .... Take 1 By Mouth Qd 3)  Cymbalta 60 Mg  Cpep (Duloxetine Hcl) .... Take 1 By Mouth Qd 4)  Diazepam 5 Mg  Tabs (Diazepam) .... Take 1 Three Times A Day Once Daily 5)  Vitamin B-12 Cr 1000 Mcg  Tbcr (Cyanocobalamin) .... Take 1 Injection Q Month 6)  Januvia 100 Mg  Tabs (Sitagliptin Phosphate) .... Qam 7)  Zocor 80 Mg  Tabs (Simvastatin) .... Qd 8)  Losartan Potassium 50 Mg Tabs (Losartan Potassium) .Marland Kitchen.. 1 Once Daily 9)  Benicar 20 Mg Tabs (Olmesartan Medoxomil) .Marland Kitchen.. 1 Tablet By Mouth Once Daily 10)  Vitamin B-6 100 Mg Tabs (Pyridoxine Hcl) .Marland Kitchen.. 1 Tablet By Mouth Once Daily 11)  Aspirin 81 Mg Tbec (Aspirin) .Marland Kitchen.. 1 Tablet By Mouth  Once Daily  Allergies (verified): 1)  ! * Niaspan  Past History:  Past Medical History: Last updated: 12/05/2006 Coronary artery disease Diabetes mellitus, type II Hypertension learning disability premcious anemia repair left hand  Review of Systems  The patient denies syncope and vision loss.    Physical Exam  General:  obese.  no distress  Lungs:  Clear to auscultation bilaterally. Normal respiratory effort.  Heart:  Regular rate and rhythm without murmurs or gallops noted. Normal S1,S2.   Pulses:  dorsalis pedis intact bilat.   Extremities:  no deformity.  no ulcer on the feet.  feet are of normal color and temp.  no edema. Neurologic:  sensation is intact to touch on the feet  Additional Exam:  ecg is noted (new changes since last year)   Impression & Recommendations:  Problem # 1:  CHEST PAIN UNSPECIFIED (ICD-786.50) hish risk for angina  Problem # 2:  HEADACHE (ICD-784.0) uncertain etiology  Problem # 3:  DIABETES MELLITUS, TYPE II (ICD-250.00) uncertain control  Medications Added to Medication List This Visit: 1)  Diazepam 5 Mg Tabs (Diazepam) .... Take 1 three times a day  once daily--dr kaur 2)  Benicar 20 Mg Tabs (Olmesartan medoxomil) .Marland Kitchen.. 1 tablet by mouth once daily 3)  Vitamin B-6 100 Mg Tabs (Pyridoxine hcl) .Marland Kitchen.. 1 tablet by mouth once daily 4)  Aspirin 81 Mg Tbec (Aspirin) .Marland Kitchen.. 1 tablet by mouth once daily  Other Orders: EKG w/ Interpretation (93000) Est. Patient Level IV (16109)  Patient Instructions: 1)  admit to [facility]--dr mcalhany. Prescriptions: LOSARTAN POTASSIUM 50 MG TABS (LOSARTAN POTASSIUM) 1 once daily  #30 x 11   Entered and Authorized by:   Minus Breeding MD   Signed by:   Minus Breeding MD on 06/10/2010   Method used:   [facility]        [facility] [address] [phone]* (retail)       [address]       [place], [place]  [address]       Ph: [phone] or [phone]       Fax:  [phone]   RxID:   [id number]    Orders Added: 1)  EKG w/ Interpretation [93000] 2)  Est. Patient Level IV [03474]  Appended Document: FU ON BP / LABS? /NWS     Allergies: 1)  ! * Niaspan   Other Orders: Admin of Therapeutic Inj  intramuscular or subcutaneous (25956) Vit B12 1000 mcg (J3420)   Medication Administration  Injection # 1:    Medication: Vit B12 1000 mcg    Diagnosis: ANEMIA, PERNICIOUS (ICD-281.0)    Route: IM    Site: R deltoid    Exp Date: 03/2012    Lot #: [id number]    Mfr: American Regent    Patient tolerated injection without complications    Given by: Brenton Grills CMA (AAMA) (June 10, 2010 10:01 AM)  Orders Added: 1)  Admin of Therapeutic Inj  intramuscular or subcutaneous [96372] 2)  Vit B12 1000 mcg [J3420]

## 2010-07-06 NOTE — Letter (Signed)
Summary: [facility]  [facility]   Imported By: Lester Franklinton 06/30/2010 09:35:22  _____________________________________________________________________  External Attachment:    Type:   Image     Comment:   External Document

## 2010-07-11 ENCOUNTER — Other Ambulatory Visit: Payer: Self-pay | Admitting: Endocrinology

## 2010-07-11 ENCOUNTER — Ambulatory Visit (INDEPENDENT_AMBULATORY_CARE_PROVIDER_SITE_OTHER): Payer: Medicare Other | Admitting: Endocrinology

## 2010-07-11 ENCOUNTER — Other Ambulatory Visit: Payer: Medicare Other

## 2010-07-11 ENCOUNTER — Encounter: Payer: Self-pay | Admitting: Endocrinology

## 2010-07-11 DIAGNOSIS — E119 Type 2 diabetes mellitus without complications: Secondary | ICD-10-CM

## 2010-07-11 DIAGNOSIS — Z79899 Other long term (current) drug therapy: Secondary | ICD-10-CM

## 2010-07-11 DIAGNOSIS — D510 Vitamin B12 deficiency anemia due to intrinsic factor deficiency: Secondary | ICD-10-CM

## 2010-07-11 DIAGNOSIS — I10 Essential (primary) hypertension: Secondary | ICD-10-CM

## 2010-07-11 DIAGNOSIS — E785 Hyperlipidemia, unspecified: Secondary | ICD-10-CM

## 2010-07-11 DIAGNOSIS — E7800 Pure hypercholesterolemia, unspecified: Secondary | ICD-10-CM

## 2010-07-11 LAB — BASIC METABOLIC PANEL
BUN: 18 mg/dL (ref 6–23)
CO2: 30 mEq/L (ref 19–32)
Calcium: 9.8 mg/dL (ref 8.4–10.5)
Chloride: 99 mEq/L (ref 96–112)
Creatinine, Ser: 0.9 mg/dL (ref 0.4–1.5)
GFR: 101.3 mL/min (ref >60.00)
Glucose, Bld: 111 mg/dL — ABNORMAL HIGH (ref 70–99)
Potassium: 4.9 mEq/L (ref 3.5–5.1)
Sodium: 136 mEq/L (ref 135–145)

## 2010-07-11 LAB — LIPID PANEL
Cholesterol: 120 mg/dL (ref 0–200)
HDL: 42.4 mg/dL (ref >39.00)
Total CHOL/HDL Ratio: 3
Triglycerides: 203 mg/dL — ABNORMAL HIGH (ref 0.0–149.0)
VLDL: 40.6 mg/dL — ABNORMAL HIGH (ref 0.0–40.0)

## 2010-07-11 LAB — HEPATIC FUNCTION PANEL
ALT: 23 U/L (ref 0–53)
AST: 17 U/L (ref 0–37)
Albumin: 4 g/dL (ref 3.5–5.2)
Alkaline Phosphatase: 50 U/L (ref 39–117)
Bilirubin, Direct: 0.1 mg/dL (ref 0.0–0.3)
Total Bilirubin: 0.8 mg/dL (ref 0.3–1.2)
Total Protein: 7 g/dL (ref 6.0–8.3)

## 2010-07-11 LAB — CBC WITH DIFFERENTIAL/PLATELET
Basophils Absolute: 0 10*3/uL (ref 0.0–0.1)
Basophils Relative: 0.6 % (ref 0.0–3.0)
Eosinophils Absolute: 0.4 10*3/uL (ref 0.0–0.7)
Eosinophils Relative: 5.7 % — ABNORMAL HIGH (ref 0.0–5.0)
HCT: 39.7 % (ref 39.0–52.0)
Hemoglobin: 13.9 g/dL (ref 13.0–17.0)
Lymphocytes Relative: 25.9 % (ref 12.0–46.0)
Lymphs Abs: 1.9 10*3/uL (ref 0.7–4.0)
MCHC: 34.9 g/dL (ref 30.0–36.0)
MCV: 92 fl (ref 78.0–100.0)
Monocytes Absolute: 0.7 10*3/uL (ref 0.1–1.0)
Monocytes Relative: 10.3 % (ref 3.0–12.0)
Neutro Abs: 4.1 10*3/uL (ref 1.4–7.7)
Neutrophils Relative %: 57.5 % (ref 43.0–77.0)
Platelets: 310 10*3/uL (ref 150.0–400.0)
RBC: 4.32 Mil/uL (ref 4.22–5.81)
RDW: 13.2 % (ref 11.5–14.6)
WBC: 7.2 10*3/uL (ref 4.5–10.5)

## 2010-07-11 LAB — URINALYSIS, ROUTINE W REFLEX MICROSCOPIC
Bilirubin Urine: NEGATIVE
Hgb urine dipstick: NEGATIVE
Ketones, ur: NEGATIVE
Leukocytes, UA: NEGATIVE
Nitrite: NEGATIVE
Specific Gravity, Urine: 1.025 (ref 1.000–1.030)
Total Protein, Urine: NEGATIVE
Urine Glucose: NEGATIVE
Urobilinogen, UA: 0.2 (ref 0.0–1.0)
pH: 6 (ref 5.0–8.0)

## 2010-07-11 LAB — LDL CHOLESTEROL, DIRECT: Direct LDL: 51.4 mg/dL

## 2010-07-11 LAB — TSH: TSH: 1.31 u[IU]/mL (ref 0.35–5.50)

## 2010-07-11 LAB — MICROALBUMIN / CREATININE URINE RATIO
Creatinine,U: 225.8 mg/dL
Microalb Creat Ratio: 1.6 mg/g (ref 0.0–30.0)
Microalb, Ur: 3.6 mg/dL — ABNORMAL HIGH (ref 0.0–1.9)

## 2010-07-11 LAB — HEMOGLOBIN A1C: Hgb A1c MFr Bld: 6.5 % (ref 4.6–6.5)

## 2010-07-22 NOTE — Discharge Summary (Signed)
Xavier [name], Xavier [name]                 ACCOUNT NO.:  [id number]  MEDICAL RECORD NO.:  [id number]          PATIENT TYPE:  INP  LOCATION:  4740                         FACILITY:  [facility]  PHYSICIAN:  Pricilla Riffle, MD, FACCDATE OF BIRTH:  1966/04/20  DATE OF ADMISSION:  06/10/2010 DATE OF DISCHARGE:  06/11/2010                              DISCHARGE SUMMARY   PROCEDURES: 1. Cardiac catheterization. 2. Coronary arteriogram. 3. Left ventriculogram.  PRIMARY FINAL DISCHARGE DIAGNOSES: 1. Chest pain, no significant coronary artery disease and at     catheterization. 2. Diabetes. 3. Hyperlipidemia. 4. Hypertension. 5. Status post bilateral inguinal hernia repair, left distal radius     and ulnar fracture requiring a stented surgery x2. 6. Obesity with body mass index of 35. 7. Pernicious anemia. 8. Allergy or intolerance to NIASPAN. 9. Family history of coronary artery disease in his father.  TIME AT DISCHARGE:  33 minutes.  HOSPITAL COURSE:  Xavier [name] is a 45 year old male with a history of nonobstructive coronary artery disease.  He had chest pain and came to the hospital where he was admitted for further evaluation.  His cardiac enzymes were negative for MI.  A lipid profile showed total cholesterol 205, triglycerides 211, HDL 43, and LDL 120.  He was taken to the Cath Lab on June 10, 2010, to define his anatomy.  Dr. Eden Emms cathed him and found normal coronary arteries with normal left ventricular function and an EF of 65%.  He was held overnight to make sure his renal function and blood sugars were controlled.  On June 11, 2010, he was seen by Dr. Tenny Craw.  There was concern for a GI origin for his symptoms, so he was started on an H2 blocker.  He was treated for his hyperlipidemia and hypertension.  He was evaluated by Dr. Tenny Craw and considered stable for discharge in improved condition, to follow up as an outpatient with primary care.  DISCHARGE INSTRUCTIONS: 1. His  activity level is to be increased gradually. 2. He is not to drive for 2 days and no lifting for a week. 3. He is to call our office for problems with the cath site. 4. He can shower. 5. He is to have no sexual activity for a week. 6. He is to follow up with Dr. Everardo All and call for an appointment. 7. He is to follow up with Dr. Daleen Squibb as needed.  DISCHARGE MEDICATIONS: 1. Tylenol Extra Strength 500 mg 2 tablets q.8 h. p.r.n. 2. Benicar HCT 20/12.5 daily. 3. Cymbalta 60 mg daily. 4. Valium 5 mg t.i.d. 5. Metformin 500 mg is on hold, restart on June 13, 2010. 6. Januvia 100 mg daily. 7. Crestor 10 mg daily. 8. Aspirin 81 mg daily. 9. Actos 45 mg daily. 10.Vitamin B12 1000 mcg daily. 11.Vitamin B6 daily.     [name], PA-C   ______________________________ Pricilla Riffle, MD, [facility]    RB/MEDQ  D:  06/11/2010  T:  06/12/2010  Job:  [id number]  cc:   Gregary Signs A. Everardo All, MD  Electronically Signed by [name] PA-C on 06/16/2010 01:26:44 PM Electronically Signed by Dietrich Pates  MD [facility] on 07/21/2010 02:10:46 PM

## 2010-07-26 NOTE — Assessment & Plan Note (Signed)
Summary: FU W/B12 INJ PER WIFE---D/T---STC   Vital Signs:  Patient profile:   45 year old male Height:      72 inches (182.88 cm) Weight:      275 pounds (125.00 kg) BMI:     37.43 O2 Sat:      97 % on Room air Temp:     98.1 degrees F (36.72 degrees C) oral Pulse rate:   83 / minute Pulse rhythm:   regular BP sitting:   102 / 68  (left arm) Cuff size:   large  Vitals Entered By: Brenton Grills CMA Duncan Dull) (July 11, 2010 10:48 AM)  O2 Flow:  Room air CC: Post Hosp F/U/discuss medications/aj Is Patient Diabetic? Yes   CC:  Post Hosp F/U/discuss medications/aj.  History of Present Illness: the status of at least 3 ongoing medical problems is addressed today: dm: pt states he feels well in general.  he was recently found to have noncardioganic chest pain.  he does not have a glucose meter. dyslipidemia:  no chest pain htn:  he takes and tolerates meds well.  no sob.  Current Medications (verified): 1)  Metformin Hcl 500 Mg Tb24 (Metformin Hcl) .... 2 Tabs Two Times A Day 2)  Actos 45 Mg  Tabs (Pioglitazone Hcl) .... Take 1 By Mouth Qd 3)  Cymbalta 60 Mg  Cpep (Duloxetine Hcl) .... Take 1 By Mouth Qd 4)  Diazepam 5 Mg  Tabs (Diazepam) .... Take 1 Three Times A Day Once Daily--Dr Evelene Croon 5)  Vitamin B-12 Cr 1000 Mcg  Tbcr (Cyanocobalamin) .... Take 1 Injection Q Month 6)  Januvia 100 Mg  Tabs (Sitagliptin Phosphate) .... Qam 7)  Zocor 80 Mg  Tabs (Simvastatin) .... Qd 8)  Losartan Potassium 50 Mg Tabs (Losartan Potassium) .Marland Kitchen.. 1 Once Daily 9)  Vitamin B-6 100 Mg Tabs (Pyridoxine Hcl) .Marland Kitchen.. 1 Tablet By Mouth Once Daily 10)  Aspirin 81 Mg Tbec (Aspirin) .Marland Kitchen.. 1 Tablet By Mouth Once Daily  Allergies (verified): 1)  ! * Niaspan  Past History:  Past Medical History: Last updated: 12/05/2006 Coronary artery disease Diabetes mellitus, type II Hypertension learning disability premcious anemia repair left hand  Review of Systems  The patient denies weight loss and weight  gain.         denies n/v  Physical Exam  General:  obese.  no distress  Pulses:  dorsalis pedis intact bilat.   Extremities:  no deformity.  no ulcer on the feet.  feet are of normal color and temp.  no edema. Neurologic:  sensation is intact to touch on the feet  Additional Exam:  Hemoglobin A1C            6.5 %  Cholesterol LDL      51.4 mg/dL    Impression & Recommendations:  Problem # 1:  DIABETES MELLITUS, TYPE II (ICD-250.00) well-controlled  Problem # 2:  HYPERCHOLESTEROLEMIA (ICD-272.0) well-controlled  Problem # 3:  HYPERTENSION (ICD-401.9) well-controlled  Medications Added to Medication List This Visit: 1)  Onetouch Ultra Blue Strp (Glucose blood) .... Once daily, and lancets 250.00  Other Orders: Vit B12 1000 mcg (J3420) Admin of Therapeutic Inj  intramuscular or subcutaneous (63875) TLB-Lipid Panel (80061-LIPID) TLB-BMP (Basic Metabolic Panel-BMET) (80048-METABOL) TLB-CBC Platelet - w/Differential (85025-CBCD) TLB-Hepatic/Liver Function Pnl (80076-HEPATIC) TLB-TSH (Thyroid Stimulating Hormone) (84443-TSH) TLB-A1C / Hgb A1C (Glycohemoglobin) (83036-A1C) TLB-Microalbumin/Creat Ratio, Urine (82043-MALB) TLB-Udip w/ Micro (81001-URINE) Est. Patient Level IV (64332)  Patient Instructions: 1)  Please schedule a "medicare wellness" appointment in  3 months. 2)  blood tests are being ordered for you today.  please call [phone] to hear your test results. 3)  here is a new blood-sugar meter.  check your blood sugar 1 time a day.  vary the time of day when you check, between before the 3 meals, and at bedtime.  also check if you have symptoms of your blood sugar being too high or too low.  please keep a record of the readings and bring it to your next appointment here.  please call us sooner if you are having low blood sugar episodes. 4)  (update: i left message on phone-tree:  rx as we discussed) Prescriptions: ONETOUCH ULTRA BLUE  STRP (GLUCOSE BLOOD) once daily,  and lancets 250.00  #50 x 8   Entered and Authorized by:   Minus Breeding MD   Signed by:   Minus Breeding MD on 07/11/2010   Method used:   [facility]        [facility] [phone][facility] (retail)       [address]       [place], [place]  [address]       Ph: [phone] or [phone]       Fax: [phone]   RxID:   [id number] LOSARTAN POTASSIUM 50 MG TABS (LOSARTAN POTASSIUM) 1 once daily  #30 x 11   Entered and Authorized by:   Minus Breeding MD   Signed by:   Minus Breeding MD on 07/11/2010   Method used:   [facility] to        [facility] [phone]* (retail)       [address]       [place], [place]  [address]       Ph: [phone] or [phone]       Fax: [phone]   RxID:   [id number] ZOCOR 80 MG  TABS (SIMVASTATIN) qd  #30 Tablet x 11   Entered and Authorized by:   Minus Breeding MD   Signed by:   Minus Breeding MD on 07/11/2010   Method used:   [facility] to        [facility] [phone]* (retail)       [address]       [place], [place]  [address]       Ph: [phone] or [phone]       Fax: [phone]   RxID:   [id number]    Medication Administration  Injection # 1:    Medication: Vit B12 1000 mcg    Diagnosis: ANEMIA, PERNICIOUS (ICD-281.0)    Route: IM    Site: R deltoid    Exp Date: 03/2012    Lot #: [id number]    Mfr: American Regent    Patient tolerated injection without complications    Given by: Orlan Leavens RMA (July 11, 2010 11:34 AM)  Orders Added: 1)  Vit B12 1000 mcg [J3420] 2)  Admin of Therapeutic Inj  intramuscular or subcutaneous [96372] 3)  TLB-Lipid Panel [80061-LIPID] 4)  TLB-BMP (Basic Metabolic Panel-BMET) [80048-METABOL] 5)  TLB-CBC Platelet - w/Differential [85025-CBCD] 6)  TLB-Hepatic/Liver Function Pnl [80076-HEPATIC] 7)  TLB-TSH (Thyroid Stimulating Hormone) [84443-TSH] 8)  TLB-A1C / Hgb A1C (Glycohemoglobin) [83036-A1C] 9)  TLB-Microalbumin/Creat Ratio, Urine [82043-MALB] 10)  TLB-Udip w/ Micro  [81001-URINE] 11)  Est. Patient Level IV [09983]

## 2010-08-05 LAB — GLUCOSE, CAPILLARY: Glucose-Capillary: 161 mg/dL — ABNORMAL HIGH (ref 70–99)

## 2010-08-09 ENCOUNTER — Ambulatory Visit (INDEPENDENT_AMBULATORY_CARE_PROVIDER_SITE_OTHER): Payer: Medicare Other | Admitting: Endocrinology

## 2010-08-09 DIAGNOSIS — D510 Vitamin B12 deficiency anemia due to intrinsic factor deficiency: Secondary | ICD-10-CM

## 2010-08-09 MED ORDER — CYANOCOBALAMIN 1000 MCG/ML IJ SOLN
1000.0000 ug | Freq: Once | INTRAMUSCULAR | Status: AC
  Administered 2010-08-09: 1000 ug via INTRAMUSCULAR

## 2010-09-07 ENCOUNTER — Ambulatory Visit (INDEPENDENT_AMBULATORY_CARE_PROVIDER_SITE_OTHER): Payer: Medicare Other | Admitting: *Deleted

## 2010-09-07 DIAGNOSIS — D510 Vitamin B12 deficiency anemia due to intrinsic factor deficiency: Secondary | ICD-10-CM

## 2010-09-07 MED ORDER — CYANOCOBALAMIN 1000 MCG/ML IJ SOLN
1000.0000 ug | Freq: Once | INTRAMUSCULAR | Status: AC
  Administered 2010-09-07: 1000 ug via INTRAMUSCULAR

## 2010-09-08 ENCOUNTER — Ambulatory Visit: Payer: Medicare Other

## 2010-09-21 ENCOUNTER — Ambulatory Visit: Payer: Medicare Other | Admitting: Endocrinology

## 2010-09-30 NOTE — Discharge Summary (Signed)
NAMEJAYLEN, KNOPE                             ACCOUNT NO.:  [id number]   MEDICAL RECORD NO.:  [id number]                   PATIENT TYPE:  OUT   LOCATION:  CATH                                 FACILITY:  [facility]   PHYSICIAN:  Thomas C. Wall, M.D. [facility]            DATE OF BIRTH:  [birth year]   DATE OF ADMISSION:  06/02/2002  DATE OF DISCHARGE:  06/03/2002                                 DISCHARGE SUMMARY   BRIEF HISTORY:  This is a 45 year old male with a positive family history  for early coronary artery disease, history of hypertension, diabetes,  obesity, and unknown lipid status who presented for evaluation of four-day  history of chest pain consistent with unstable angina.  The patient had  negative enzymes.  He had nonspecific ST changes.  He was seen by Dr. Daleen Squibb  and admitted for further evaluation.   PAST MEDICAL HISTORY:  Please see history as noted above.  The patient does  have a history of hypertension and diabetes.  He is overweight.   ALLERGIES:  The patient is allergic to BEE STINGS.  There are no known drug  allergies.   MEDICATIONS PRIOR TO ADMISSION:  1. Vioxx.  2. Hydrochlorothiazide.  3. Elavil.  4. Glucophage.  5. Accupril.   HOSPITAL COURSE:  As noted, this patient was admitted to [facility]  for further evaluation of chest pain.  He ruled out for an MI.  He was  scheduled for a cardiac catheterization.   The cardiac catheterization was performed June 02, 2002 by Dr. Riley Kill.  This showed a normal left ventricle.  He had a 30%-40% mid LAD lesion, as  well as a 20% proximal circumflex lesion.  Please see Dr. Rosalyn Charters complete  dictated report for all details.   During the patient's stay, he had a D-dimer that was negative.  He also had  a chest CT on May 31, 2002 that was negative for a pulmonary embolus.  Arrangements were made to discharge the patient on June 03, 2002.  His  Glucophage was discontinued secondary to his cardiac  catheterization.  He  was placed on Avandia for now.  He has requested followup with the [facility]  Internal Medicine practice and he was scheduled to see Dr. Romero Belling.   LABORATORY DATA:  CBC on the date of discharge revealed hemoglobin 15.1,  hematocrit 43, WBC 11,500, platelets 370,000.  Chemistries revealed a BUN of  21, creatinine 1.2, sodium 131, potassium 3.6, glucose 180.  A D-dimer is  0.30.  Cholesterol is 167, triglycerides 308, HDL 41, LDL 64.  Cardiac  enzymes were negative.  A TSH was 2.715.   A chest x-ray showed no acute abnormality.   DISCHARGE MEDICATIONS:  The patient was discharged on:  1. Avandia 4 mg daily.  2. Enteric-coated aspirin 81 mg daily.  3. Hydrochlorothiazide 25 mg 1/2 tablet daily.  4. Accupril 20 mg  daily.  5. Elavil 25 mg 1/2 to 1 tablet at bedtime.  6. Vioxx p.r.n.  7. K-Dur 20 mEq daily.  8. He was told not to take Glucophage for now.  9. He was told to take Tylenol as needed for pain.   ACTIVITY:  The patient was told to avoid any strenuous activity or driving  for two days.   DIET:  He was to be on a low-salt/low-fat diabetic diet.   DISCHARGE INSTRUCTIONS:  He was told to call the office if he had any  increased pain, swelling, or bleeding from his groin.  He was to have a  basic metabolic panel on his next office visit.  He was to see Dr. Vern Claude  physician assistant February 3 at noon.  He will see Dr. Everardo All Friday,  January 30, at 9 a.m.   PROBLEM LIST AT TIME OF DISCHARGE:  1. Chest pain, myocardial infarction ruled out.  2. Cardiac catheterization.  Please see results, as noted above.  Minimal     coronary artery disease with normal left ventricle.  3. Chest CT performed May 31, 2002 negative for pulmonary embolus.  4. Diabetes mellitus.  5. Obesity.  6. Positive family history of coronary artery disease.  7. Dyslipidemia, as noted.  8. History of left wrist surgery February 2003.  9. Mildly low potassium.     Delton See, P.A. [facility]                  Thomas C. Daleen Squibb, M.D. [facility]    DR/MEDQ  D:  06/03/2002  T:  06/03/2002  Job:  [id number]   cc:   [facility]  [address]  [place]  [place] [address]  Fax: [phone]   Sean A. Everardo All, M.D. [facility]

## 2010-09-30 NOTE — Op Note (Signed)
[place]. [facility]  Patient:    Xavier [name], Xavier [name] Visit Number: [id number] MRN: [id number]          Service Type: MED Location: 1800 1829 01 Attending Physician:  Hanley Seamen Dictated by:   Elisha Ponder, M.D. Proc. Date: 07/09/01 Admit Date:  07/09/2001   CC:         Sherri Rad, M.D.   Operative Report  DATE OF BIRTH:  [birth year]  PREOPERATIVE DIAGNOSIS:  Left comminuted complex interarticular open radius and ulnar fracture at a distal level with retained metal fragments in the soft tissues.  POSTOPERATIVE DIAGNOSIS:  Left comminuted complex interarticular open radius and ulnar fracture at a distal level with retained metal fragments in the soft tissues.  PROCEDURES: 1. Irrigation and debridement of the skin and subcutaneous tissue of bone and    tinnitus tissue, left open distal radius and ulnar fracture. 2. Exploration of ulnar nerve at the open area. 3. Removal of foreign body encased in the area about the sheath of the ulnar    nerve.  This was metallic foreign body localized on x-ray both    preoperatively and intraoperatively. 4. Open reduction and internal fixation with Kirschner wire fixation, left    radius fracture. 5. External fixation, left radius fracture. 6. Open reduction and internal fixation of left ulnar styloid fracture with    K-wire and tension band technique.  SURGEONS:  Elisha Ponder, M.D., Sherri Rad, M.D., and Ottie Glazier. Wynona Neat, P.A.-C. served as assistant for portions of the operation.  ANESTHESIA:  General.  TOURNIQUET TIME:  Just at an hour.  DRAINS:  None.  ESTIMATED BLOOD LOSS:  Minimal.  INDICATIONS FOR THE PROCEDURE:  This patient is a 45 year old male who underwent an on-the-job injury today.  He presented with the above-mentioned diagnosis.  He was seen and examined by Dr. Lestine Box.  This is detailed in his chart.  He understands the risks and benefits of surgery including the  risk of infection, bleeding, anesthesia, damage to normal structures after surgery. Explained were the accomplishments and intended goals of relieving symptoms and restoring function with surgery.  With this in mind, he desires to proceed.  He understands the risk of malunion, nonunion, dystrophic reaction, neurovascular injury, etc.  This is unfortunately a very complex comminuted interarticular fracture and has a propensity toward fragmentation, etc.  We have discussed with the patient all issues.  He understands that we will perform I&D and fixation as necessary.  The patient will be on antibiotics preoperatively and postoperatively secondary to the open nature of the injury.  OPERATIVE FINDINGS:  The patient had a complex comminuted interarticular radius fracture.  He underwent ORIF and external fixator application.  He also underwent foreign body removal, I&D of the open wound and a styloid ORIF. Stress radiography was performed during the operation and distal radioulnar joint was competent following the repairs.  PROCEDURE IN DETAIL:  The patient was seen by Dr. Lestine Box in the ER.  The risks and benefits were discussed.  He was sent to the operative suite and underwent prophylactic antibiotic administration.  Tetanus was noted to be up to date.  Once in the operative suite, he underwent a smooth induction of general anesthesia.  Following this, he was positioned supine and appropriately padded, and prepped and draped in the usual sterile fashion about the left upper extremity.  I should note that the patients neurovascular examination was somewhat difficult to obtain preoperatively but he  did have what appeared to be intact sensation roughly in the fingertips to light touch.  As mentioned, neurovascular examination was somewhat difficult due to pain and the fracture.  Once adequate field was available and sterile draping had ensued, the patient had I&D of the open wound.   Extension was made proximally and distally as well as a transverse extension.  The patient had the fracture site exposed about the ulnar styloid and soft tissues and the patient then underwent a copious amount of irrigant to the area.  Approximately 3 L of saline was placed in the wound and copiously lavaged.  The patient tolerated this well without problems.  There were no complications with this.  Once this was done, the patient had attention turned toward the radius.  The patient underwent application of a Synthes external fixator and an incision was made at the distal middle third of the arm and dissection between the ECRB and ECRL was carried out to the periosteum and the pronator teres identified and two half pins were inserted under radiographic images in usual fashion without difficulty.  Following this, the patient had the wound irrigated and closed with Prolene suture.  Once this was performed, the patient then underwent placement of two half pins distally.  This was done by making an incision about the radial aspect of the hand (dorsal radial).  The dorsal sensory branches were identified and protected.  Following this, the second metacarpal was identified and the patient underwent two half pins to be inserted at the second metacarpal.  One was performed at the base, and the other one just distal to it with the Synthes alignment guide.  Following this, the external fixator was applied, traction was placed and once this was done, fixation about the distal radius fracture was accomplished with Kirschner wire.  This entered at the radial styloid and engaged proximal ulnarly.  This provided adequate fixation.  However, I should note that the patient had a very highly comminuted, complex interarticular fracture and the bone was very comminuted. This was unfortunate for the patient but certainly the noted findings intraoperatively.  Once this was noted, the patient then had the  external fixator adjusted to place the lunate in the lunate fossa in slight  dorsiflexion.  The patient tolerated this well.  I was pleased with AP and lateral x-rays.  Overall, he had a good radial length, radial inclination was adequate, though there was still some comminution noted and a volar tilt was adequate.  Following this, the patient was turned back toward the open wound, transverse extension was made and the patient then underwent exploration of the ulnar nerve.  The ulnar nerve was explored and noted to be intact.  There was a large metallic body in the sheath of the ulnar nerve and this was very carefully dissected under 4.0 loop magnification.  It was removed and checked under radiograph to make sure it was completely gone.  It was completely gone and this was noted to be the case.  This was found to be satisfactory. Following this, the patient underwent ulnar styloid ORIF with tension band wire technique.  A K-wire was placed in the ulnar styloid tip engaged at the opposite cortex just slightly.  Following this, tension band wire was placed through drill holes distal approximately two fracture lengths distal to the fracture site and this tension band was then placed in figure-of-eight fashion to snug down the ulnar styloid fracture.  This was done to my satisfaction without difficulty.  Following this, periosteal tissue was repaired.  Further irrigation was applied, tourniquet was deflated, hemostasis was noted to be excellent and following this, the patient underwent wound closure with Prolene.  The patient tolerated this well.  The K-wires were clipped beneath the skin.  The patient had excellent refill and was then placed in a splint. Upon waking up, he had a fairly violent wake up and some blood loss coming from the proximal pins.  Thus, I took this down and examined it and there was no pulsatile bleeding or excessive hemorrhage and thus, I redressed this.  He will be  followed closely.  On examination in the recovery room, he was stable. We are going to have him seen by occupational therapy for edema control, massage, and range of motion actively and passively to the fingers.  We are going to place him on antibiotics due to the open nature of his injuries, and have appropriate pain medicine according to his needs.  We are going to watch his condition closely.  Postoperative CT scan will be performed to evaluate the articular surface.  This will be performed tomorrow morning and we will then review this.  I have discussed with the patient these issues.  All questions have been encouraged and answered. Dictated by:   Elisha Ponder, M.D. Attending Physician:  Hanley Seamen DD:  07/09/01 TD:  07/09/01 Job: [id number]

## 2010-09-30 NOTE — Op Note (Signed)
[name]. [facility]  Patient:    Xavier [name], Xavier [name] Visit Number: [id number] MRN: [id number]          Service Type: MED Location: 5000 5003 01 Attending Physician:  Sherri Rad Dictated by:   Dominica Severin III, M.D. Proc. Date: 07/11/01 Admit Date:  07/09/2001   CC:         Sherri Rad, M.D.   Operative Report  DATE OF BIRTH:  03/26/1966  PREOPERATIVE DIAGNOSES:  Status post left open distal radius and ulnar fracture treated with I&D, ex-fix, foreign body removal, etc. who now presents for definitive plate fixation volarly due to comminution and displacement.  POSTOPERATIVE DIAGNOSES:  Status post left open distal radius and ulnar fracture treated with I&D, ex-fix, foreign body removal, etc. who now presents for definitive plate fixation volarly due to comminution and displacement.  PROCEDURE:  1. Open reduction and internal fixation with fixed angled distal volar radius     plate, left radius.  2. Left carpal tunnel release.  3. Stress radiography, left wrist.  4. External fixator adjustor, left wrist.  SURGEON:  Aron Baba, M.D.  ASSISTANT:  Ralene Bathe, P.A.  COMPLICATIONS:  None.  ANESTHESIA:  General LMA anesthetic.  DRAINS:  One TLS drain was placed in the volar wound.  TOURNIQUET TIME:  Less than 90 minutes.  INDICATIONS FOR PROCEDURE:  This patient is a very pleasant 45 year old white male who was injured on the job and presents with the above mentioned injuries. The patient has undergone initial I&D, ex-fix, and pinning of the radius fracture. He also underwent ulnar fracture ORIF as well as ulnar nerve exploration and removal of foreign metallic object in the volar soft tissues adherent to the ulnar nerve sheath. The patient had CT scan showing the comminution and now presents for definitive ORIF of the radius fracture. The ulnar fracture has now been stabilized. I discussed with the patient the  risks and benefits of surgery including risk of infection, bleeding, anesthesia, damage to normal structures, failure of the surgery to accomplish its intended goals of relieving symptoms and restoring function. With this in mind, he desires to proceed. All questions have been encouraged and answered preoperatively.  OPERATIVE FINDINGS:  The patient had ORIF performed without difficulty. He tolerated the procedure well. He had excellent reduction and I was very pleased with his stability and findings. He did have a carpal tunnel release performed concomitantly as well as external fixator adjustment. I will remove the external fixator early and begin range of motion as I do feel he is an excellent candidate for this.  DESCRIPTION OF PROCEDURE:  The patient was seen by myself an anesthesia, permit was signed and he was taken to the operative suite. Prophylactic antibiotics were given. He underwent general LMA anesthetic with leg supine, appropriately padded and prepped and draped in the usual sterile fashion about the left upper extremity with Betadine scrub and paint. Once this was done, the patient had the external fixator loosened. I then placed traction on the wrist and tightened it back down. Following this, a volar approach to the radius was made. This was done by incising the skin and then the FCR sheath both palmarly and dorsally. FCR was retracted radially, carpal canal contents retracted ulnarly. The pronator quadratus was incised and retracted in a radial to ulnar fashion identifying the fracture. Once this was done, previous Kirschner wires were removed about the radius where they had been initially placed. Following this,  manipulative reduction was accomplished to my satisfaction and application of a distal volar radius plate from [employer] was applied. The patient had five screws placed proximally and four in the distal portion of the plate which engaged the fracture  fragments. I was able to introduce and take the fracture fragments nicely and provide excellent purchase of the screws. This was all done in a standard AO technique and in accordance with application procedures for the DVR plate. This is a fixed angled plate and allows the distal screws to screw into the plate thus preventing any dorsal collapse of the fractured fragments. The patients volar fracture fragment were corrected to anatomic reduction and I was very pleased with the joint surface as stress radiography was performed at multiple points throughout the case to ensure that the reduction was adequate and things looked well. This was done to my satisfaction. I left the most proximal two screw holes free as the patient had an external fixator and I did not want to place a permanent screw near the external fixator screws. The plate was placed without difficulty and to my satisfaction. I should note two Kirschner wires were removed from their previous placement during this. Following this, I performed stress radiography with the external fixator loosened completely and this showed stability and recreation of the anatomy nicely. This I do feel the patient will be a candidate for early external fixator removal. Following this, I performed a carpal tunnel release by incising the skin distal to the distal wrist crease along the radial border of the ring finger stopping at Hilgenreiners line. Following this, dissection was carried down to the palmar fascia which was split.  The carpal canal was then opened by incising the transverse carpal ligament. This was incised under direct 4.0 loop magnification. Distally and proximally, he was fully released without problems. The canal was quite tight, the median nerve was intact. I took care to release the ligament just off of the most ulnar aspect. Following this, the patient had the external fixator adjusted an additional time to make sure it was not  over distracted, that the lunate sat nicely in an extended position to  allow for maximal flexion pull-through of the tendons postoperatively. This was checked under x-ray. Following this, permanent copy x-rays were made, tourniquet deflated, hemostasis obtained and the wounds were then closed. The volar wound was closed with interrupted Vicryl suture over the pronator quadratus which was reattached nicely. Following this, 2-0 Vicryl was placed and the 3-0 Prolene was then placed in the skin edge. The dorsal radial incision made about the wrist which was approximately 1 cm in length was closed with three Prolenes. This was made during the course of removing the Kirschner wires. The carpal tunnel incision was closed with simple Prolene sutures of the 4-0 variety. All compartments were soft. I did release fascia during the approach. The patient had excellent refill and no significant bleeding after hemostasis was obtained. I was very pleased with the operation and the alignment and permanent copies were taken for documentation. Following this, the patient awoke from anesthesia and was transferred to the recovery room. We did place a sterile dressing and a volar plaster splint on the hand after Xeroform was applied and the drain was hooked up to suction. In the recovery room, he was stable, awake, alert and oriented and will be monitored closely.  We will continue pain management, IV antibiotics, OT for edema control, finger range of motion, etc. I have discussed all  issues with the family and will work diligently to try to restore this mans function. All questions have been encouraged and answered. Dictated by:   Dominica Severin III, M.D. Attending Physician:  Sherri Rad DD:  07/11/01 TD:  07/12/01 Job: [id number]

## 2010-09-30 NOTE — Consult Note (Signed)
[place]. [facility]  Patient:    Xavier [name], Xavier [name] Visit Number: [id number] MRN: [id number]          Service Type: MED Location: 1800 1829 01 Attending Physician:  Hanley Seamen Dictated by:   Sherri Rad, M.D. Admit Date:  07/09/2001                            Consultation Report  NO DICTATION Dictated by:   Sherri Rad, M.D. Attending Physician:  Hanley Seamen DD:  07/09/01 TD:  07/09/01 Job: [id number]

## 2010-09-30 NOTE — H&P (Signed)
NAME:  [name], [name]                        ACCOUNT NO.:  [id number]   MEDICAL RECORD NO.:  [id number]                   PATIENT TYPE:  INP   LOCATION:  0101                                 FACILITY:  [facility]   PHYSICIAN:  Jesse Sans. Wall, M.D. [facility]            DATE OF BIRTH:  1966/03/22   DATE OF ADMISSION:  05/30/2002  DATE OF DISCHARGE:                                HISTORY & PHYSICAL   CHIEF COMPLAINT:  Chest tightness and burning with hurting up in the neck  over the last 4-5 days.   HISTORY OF PRESENT ILLNESS:  [name] is a very pleasant 45 year old  married white male, son of a patient of mine, who comes in with the above  complaints. He had about five hours of pain today before he came to the  emergency room this evening. Cardiac risk factors include family history,  hypertension, diabetes mellitus type II, and obesity. He does not know his  lipid profile. He saw Dr. Donnetta Simpers recently who found his blood pressure to  be high. He was started on HCTZ. Apparently he had some nausea and vomiting  but it is not clear whether this was related to the medicine or his ongoing  problem. EKG in the emergency room showed ST segment flattening in the  lateral leads. Initial CPK and troponin were negative. Chest x-ray shows no  acute cardiopulmonary disease. Other lab data is unremarkable. He does have  an elevated blood sugar.   ALLERGIES:  No known drug allergies. He may be intolerant to HCTZ but this  is not clear.   CURRENT MEDICATIONS:  1. Accupril 20 mg per day.  2. Glucophage 850 mg twice a day.  3. HCTZ 25 mg per day.   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 He has a metal plate in place.  He is currently trying to get [employer] and is undergoing a law  suit for this. His other medical problems include obesity, hypertension, and  diabetes mellitus type II.   SOCIAL HISTORY:   He lives in [place] with his wife. He has two boys ages  57 years and 9 years. He is currently disabled. His wife does not work.   REVIEW OF SYSTEMS:  Unremarkable.   FAMILY HISTORY:  Positive for early family history of coronary artery  disease in his dad.   PHYSICAL EXAMINATION:  VITAL SIGNS: Blood pressure is currently 190/90. His  heart rate is 87 and regular. Respiratory rate 16. Temperature 96.7. O2 sat  is 90%.  GENERAL: No acute distress. He is very pleasant but anxious.  SKIN: Warm and dry.  HEENT: Examination unremarkable.  NECK: Carotid upstrokes equal without bruits. No jugular venous distention.  Thyroid non-palpable. Trachea is midline.  CHEST: Reveals an S4 at the apex. Normal split S2. No murmur.  LUNGS: Clear to auscultation and percussion.  ABDOMEN: Soft with good bowel sounds. No obvious tenderness and no  hepatosplenomegaly.  EXTREMITIES: No edema. Pulses brisk bilaterally. No dorsalis pedis or  posterior tibial. No sign of deep vein thrombosis.  NEURO: Examination intact.   LABORATORY DATA:  Creatinine 1.0, glucose 194, potassium 3.7, hemoglobin  14.8, platelet count 358. CPK 276, MB 1.1, troponin 0.02.   ASSESSMENT:  1. Rule out myocardial infarction.  2. Hypertension.  3. Diabetes mellitus type II.  4. Obesity.  5. Probable hyperlipidemia.  6. Positive family history of coronary artery disease.  7. History of traumatic left hand and arm injury, necessitating current     disability.   PLAN:  1. Telemetry.  2. Serial enzymes.  3. Check TSH and fasting lipids.  4. Aspirin, beta blocker, ace inhibitor, HCTZ for now, and Lovenox. Will try     a lower dose of HCTZ at 12.5 mg.  5. Increase Glucophage to 1,000 mg twice a day. Will hold on the day of the     catheterization.  6. Begin Avandia 4 mg per day.  7. Dietary consultation.  8. Cardiac catheterization on Monday, June 02, 2002.  9. Indications with risks and potential benefits have been  discussed with     the patient's wife. They agree to proceed.                                               Thomas C. Daleen Squibb, M.D. [facility]    TCW/MEDQ  D:  05/31/2002  T:  05/31/2002  Job:  [id number]   cc:   [facility]  [address]  [place]  [place] [address]  Fax: [phone]

## 2010-09-30 NOTE — Op Note (Signed)
[place]. [facility]  Patient:    Xavier [name], Xavier [name]                       MRN: [id number] Proc. Date: 01/27/00 Adm. Date:  [id number] Disc. Date: [id number] Attending:  Brandy Hale CC:         Dr. Louanna Raw   Operative Report  PREOPERATIVE DIAGNOSIS:  Bilateral inguinal hernias and umbilical hernia.  POSTOPERATIVE DIAGNOSIS:  Bilateral inguinal hernias and umbilical hernia.  OPERATION PERFORMED: 1. Repair of bilateral inguinal hernias with mesh. 2. Repair of umbilical hernia.  SURGEON:  Angelia Mould. Derrell Lolling, M.D.  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  He has noticed a bulge in his umbilicus for about four months.  He has noticed a bulge and some discomfort in his left groin and his right groin for a longer period of time.  The bulges in his groin have been enlarging.  On examination, the patient is somewhat overweight.  He has reducible umbilical hernia with a defect about 2 cm in diameter.  He has a very large left inguinal hernia and a medium-sized right inguinal hernia.  I cannot reduce the left inguinal hernia.  He was brought to the operating room for repair of all three hernias.  DESCRIPTION OF PROCEDURE:  Following the induction of general endotracheal anesthesia, the patients genitalia, groins and abdomen were prepped and draped in sterile fashion.  0.5% Marcaine with epinephrine was used as a local infiltation anesthetic.  An oblique incision was made in the left groin.  Dissection was carried down through the subcutaneous tissues.  External inguinal ring was identified.  The external oblique was incised in the direction of its fibers and retracted. The cord structures were quite large and bulky and took some time to dissect. Alternately, I was able to mobilize all of the cord structures and what turned out to be a huge indirect inguinal hernia.  Cremasteric muscle fibers  were divided which facilitated dissection.  We identified the hernia sac and dissected the vas deferens and testicular vessels away from the large hernia sac.  We stretched the internal inguinal ring somewhat and we were ultimately able to reduce very large hernia contents.  We opened the hernia sac and inspected it and found that there was no incarcerated component and no sliding component.  The sac was debrided back to the internal ring and then the sac was twisted and suture ligated with 2-0 silk suture ligature.  The excised sac was sent for pathology.  The floor of the inguinal canal was repaired and reinforced with an onlay graft of polypropylene mesh.  The mesh was cut in an appropriate shape about 3 inches by 6 inches and sutured in place with running sutures of 2-0 Prolene.  The mesh was sutured so as to generously overlap the fascia and pubic tubercle, along the inguinal ligament inferiorly, along the internal oblique fascia superiorly.  The mesh was incised laterally so as to wrap around the cord structures at the internal ring.  The tails of the mesh were overlapped laterally and suture lines completed.  A couple of extra sutures of Prolene were placed to be sure that the repair of the internal ring was tight enough.  The ilioinguinal nerve was preserved.  The wound was irrigated with saline.  The external oblique was closed with a running suture of 2-0 Vicryl placed in the cord  structures and ilioinguinal nerve deep to the external oblique.  Scarpas fascia was closed with 3-0 Vicryl and the skin closed with skin staples and Steri-Strips.  A similar mirror image oblique incision was made in the right groin. Dissection was carried down with identical exposure.  We found that he had a medium sized direct inguinal hernia on the right side.  We dissected out the cord structures very carefully.  We found that he had a small lipoma of the cord and that was excised.  He did not an  indirect hernia.  The floor of the canal was repaired and reinforced in a similar fashion with an onlay patch of polypropylene mesh.  Again, a 3 inch x 6 inch piece was used to cover all the way from the pubic tubercle out laterally to well lateral to the internal ring.  The mesh was sutured in place with running sutures of 2-0 Prolene with an identical repair as described on the left side.  Wound closure was identical to the left side.  We then made a transverse curved incision below the umbilicus.  Dissection was carried down into subcutaneous tissues.  We dissected out a very large umbilical hernia sac which had some omentum in it which was reduced back into the abdominal cavity.  We debrided the hernia sac all the way back to the fascia.  The defect in the fascia was about 2.0 cm in diameter.  We undermined the subcutaneous tissue circumferentially to facilitate suture placement and closure.  The defect at the umbilicus was closed with three interrupted vest over pants sutures of 0 Novofil.  These sutures were individually placed and then tied.  This overlapped the fascia quite well.  The fascia was quite thick and this appeared to be a secure closure.  The wound was irrigated with saline.  The umbilicus was tacked back down to the fascia with a 3-0 Vicryl suture.  The subcutaneous tissues were closed with 3-0 Vicryl sutures and the skin closed with a running subcuticular suture of 4-0 Vicryl and Steri-Strips. Clean bandages were placed on all of the incisions and the patient taken to the recovery room in stable condition.  Estimated blood loss was about 30 to 40 cc.  Complications were none.  Sponge, needle and instrument counts were correct. DD:  01/27/00 TD:  01/29/00 Job: [id number]

## 2010-09-30 NOTE — Consult Note (Signed)
[place]. [facility]  Patient:    Xavier [name], Xavier [name] Visit Number: [id number] MRN: [id number]          Service Type: MED Location: [phone] Attending Physician:  Sherri Rad Dictated by:   Sherri Rad, M.D. Proc. Date: 07/09/01 Admit Date:  07/09/2001 Discharge Date: 07/14/2001   CC:         Elisha Ponder, M.D.   Consultation Report  CHIEF COMPLAINT:  Left wrist pain.  HISTORY OF PRESENT ILLNESS:  This is a 45 year old right hand dominant gentlemen who while at work today at approximately 7:45 a.m. had freight fall onto his left wrist. He had immediate pain and swelling. He had a wound on the ulnar aspect of his left wrist as well. He was then taken to [facility] ER where x-rays were obtained and I was consulted for further evaluation and treatment. He states that he had transient numbness in his left hand and this resolved once he was brought to [facility] ER. He had no other areas of pain and no other complaints other than his left wrist.  PHYSICAL EXAMINATION:  LEFT WRIST:  He appears to have an open fracture involving the ulnar aspect of his left wrist which communicates to bone. This was not probed in the ER. A sterile dressing was applied as well as a Bohler splint. He had a palpable radial pulse. This sensation was intact by touch over all fingers as well. Active range of motion of his fingers did not cause any forearm or hand pain, just wrist pain. His compartments are soft in both his hand and his forearm. He had no other areas of tenderness.  X-RAYS:  X-rays revealed a comminuted left distal radius fracture, intra-articular with an ulnar styloid fracture, which communicated and was open. He also had a foreign body that was seen in the volar ulnar aspect of his wrist.  IMPRESSION:  Open left comminuted left intra-articular complex distal radius fracture and ulnar styloid fracture with foreign body.  PLAN:  I explained to Mr.  and Mrs. Roderic Scarce the path of physiology behind the above, namely the fact that this is a very complex fracture that will require not only my help but also Dr. Brunetta Genera help who is our hand specialist in fixing this. We will take him back immediately for irrigation debridement with an external fixator application and possible open reduction internal fixation. We went over the risks of the procedure which include infection, nerve vessel injury, malunion, nonunion, stiffness, arthritis, and a possible repeat surgery which were all explained and questions were answered. Dr. Amanda Pea was consulted and I assisted him in the fixation of the above fracture with an external fixator as well as removal of the foreign body that was imbedded in the ulnar nerve as well as fixation of both the radial and ulnar styloid as well. He will most likely require a volar plate application at a later date depending on the results of the CT scan postoperatively. Dictated by:   Sherri Rad, M.D. Attending Physician:  Sherri Rad DD:  07/09/01 TD:  07/09/01 Job: [id number]

## 2010-09-30 NOTE — Consult Note (Signed)
Xavier [name], Xavier [name]                 ACCOUNT NO.:  [id number]   MEDICAL RECORD NO.:  [id number]          PATIENT TYPE:  EMS   LOCATION:  ED                           FACILITY:  [facility]   PHYSICIAN:  Michael L. Reynolds, M.D.DATE OF BIRTH:  Jan 20, 1966   DATE OF CONSULTATION:  DATE OF DISCHARGE:                                   CONSULTATION   CHIEF COMPLAINT:  Weakness and right arm numbness.   HISTORY OF PRESENT ILLNESS:  This is the initial emergency room consultation  evaluation of this 45 year old man with a past medical history which  includes hypertension, diabetes, high cholesterol, and mild coronary artery  disease.  The patient was at [employer] this afternoon when he experienced the  sudden onset of generalized flushing and a light-headed sensation along with  diaphoresis.  He had a dizzy sensation which he thought was somewhat  vertiginous.  Then, he noticed the acute onset of decreased hearing in his  right ear, with a numb sensation involving his right and felt that his legs  wouldn't hold me up.  There was no clear focal weakness of the legs, but  they felt weak, and he had to sit down.  After sitting down for a few  minutes, he was able to ambulate with some assistance to the outside of the  store, where he sat down and ate a candy bar and subsequently felt better,  although he was still rather diaphoretic.  In the [facility] Emergency  Room, his symptoms resolved entirely.  The patient reports that he took his  diabetes medicines this morning and then did not eat.  He did not check his  blood sugar during the episode at all.  He denies any history of any  previous symptoms similar to this.  He has never had transient focal  neurologic symptoms before.   PAST MEDICAL HISTORY:  1.  Noninsulin-dependent diabetes.  2.  Hypertension.  3.  Hypercholesterolemia.  4.  Coronary artery disease, with cardiac catheterization in January 2005      showing non-severe disease which was  treated medically.  He reports that      he had a normal carotid Doppler, a 2-D echocardiogram study at [facility] only a couple of months ago.   SOCIAL:  He does not smoke.   FAMILY HISTORY:  Per the ER records.   MEDICATIONS:  1.  Glucophage 500 mg q.i.d.  2.  Actos 45 mg daily.  3.  Lisinopril/HCTZ 20/12.5 mg daily.  4.  Vytorin 10 mg/40 mg daily.  He reports that Dr. Everardo All is going to      increase his Vytorin dose to 10/80 soon.  5.  Aspirin daily.  6.  Lexapro 20 mg daily.  7.  Valium 5 mg b.i.d.   REVIEW OF SYSTEMS:  Except as noted in the HPI, full 10-system review of  systems is unremarkable.  Also see ER and nursing records.   PHYSICAL EXAMINATION:  TEMPERATURE:  97.1.  BLOOD PRESSURE:  111/71.  PULSE:  64.  RESPIRATIONS:  20.  GENERAL:  He is alert.  This is an obese but alert and healthy-appearing  man, supine on the hospital bed, in no evident distress.  HEAD:  Cranium is normocephalic, atraumatic.  Oropharynx is benign.  NECK:  Supple, without carotid bruits.  HEART:  Regular rate and rhythm, without murmurs.  NEUROLOGIC:  Mental status:  He is awake and alert.  He is able to name  objects and repeat phrase without difficulty.  He is fully oriented.  Recent  and remote memory are intact.  Attention span, concentration, and fund of  knowledge area all appropriate.  Cranial nerves:  Funduscopic exam is  benign.  Pupils equal and briskly reactive.  Extraocular movements full,  without nystagmus.  Visual fields full to confrontation.  Hearing is intact  and symmetric to finger rub.  Facial sensation is intact to pinprick.  Face,  tongue, and palate move normally and symmetrically.  Shoulder shrug strength  is normal.  Motor and sensory:  Normal bulk and tone.  Normal strength in  all tested extremity muscles.  Sensation intact and symmetric to pinprick in  all extremities.  Reflexes are 3+ and symmetric.  Toes are downgoing.  Cerebellar:  Normal  finger-to-nose testing.  Gait:  He rises from the gurney  easily.  Stance is normal.  He can walk normally and can tandem walk without  difficulty.   LABORATORY REVIEW:  CBC:  White count 8.6, hemoglobin 13.5, platelets  321,000.  BMET is unremarkable, except for a mildly elevated glucose of 114.  Coags are normal.  Cardiac enzymes are normal.  CT of the head is normal.  MRI of the brain performed acutely today is normal.  MRA demonstrates some  apparent diffuse disease of the nondominant right vertebral artery,  otherwise unremarkable.   IMPRESSION:  Transient right arm numbness and dizziness.  This happened  after he had taken his diabetes medications and then not eaten.  Most  likely, this represents a hypoglycemic episode.  It is possible but unlikely  that he had a TIA, but he has had the equivalent of a stroke workup  recently, and this was basically unremarkable.   PLAN:  Will continue aspirin today.  Would continue to monitor blood sugar,  blood pressure, and lipids closely.  He may need a repeat fasting lipid  panel.  Would also suggest checking a fasting homocysteine level.  He should  report to his primary physician immediately or come back to the emergency  room if he again develops focal neurologic symptoms.   Thank you for the consultation.      MLR/MEDQ  D:  03/04/2004  T:  03/04/2004  Job:  [id number]   cc:   Gregary Signs A. Everardo All, M.D. [facility]

## 2010-09-30 NOTE — Discharge Summary (Signed)
[place]. [facility]  Patient:    Xavier [name], Xavier [name] Visit Number: [id number] MRN: [id number]          Service Type: MED Location: [id number] Attending Physician:  Leonides Grills A Dictated by:   Alexzandrew L. Perkins, P.A.-C. Admit Date:  07/09/2001 Discharge Date: 07/14/2001   CC:         Sherri Rad, M.D.   Discharge Summary  ADMISSION DIAGNOSES: 1. Left comminuted radial fracture with open ulnar styloid fracture. 2. Left knee pain.  DISCHARGE DIAGNOSES: 1. Left comminuted intra-articular open radius and ulnar fracture status post    incision and drainage, left open distal radius ulnar fracture with    exploration of ulnar nerve, removal of foreign body. 2. Status post open reduction and internal fixation of left radius fracture. 3. Status post open reduction of left ulnar styloid fracture. 4. Status post open reduction and internal fixation distal volar radius    fracture with plate. 5. Status post left carpal tunnel release. 6. Status post external fixation of left radius fracture. 7. New onset diabetes mellitus.  PROCEDURES:  Procedure #1 - The patient was taken to the operating room on July 09, 2001, and underwent irrigation and debridement of skin and subcutaneous tissue of bone and surrounding tissues of left open distal radius ulnar fracture, exploration of the ulnar nerve of the open area, removal of foreign body, open reduction and internal fixation of left radius fracture with application of external fixator of left radius fracture and also open reduction and internal fixation left ulnar styloid fracture.  Surgeon Onalee Hua, M.D., assisted by Leonides Grills, M.D. also Karie Chimera, P.A.-C. under general anesthesia, tourniquet time approximately one hour.  Procedure #2 - The patient was taken back to the operating room on July 11, 2001, and underwent open reduction and internal fixation with a fixed angle distal ulnar  radius plate for the radius fracture, left carpal tunnel release, stress radiography of the left wrist, external fixator adjustment of the left wrist. Surgeon was Onalee Hua, M.D. with assistance of [facility] under general anesthesia, tourniquet time less than 90 minutes.  CONSULTATIONS:  Elisha Ponder, M.D.  BRIEF HISTORY:  The patient is a 45 year old man who sustained an on-the-job injury on the day of admission.  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  He was seen and evaluated and found to have open fractures to the left arm.  His left arm got caught up in the back rest of the fork lift during a fall.  The patient was found to have open fractures.  He was seen and evaluated by Dr. Leonides Grills and admitted for surgical intervention due to the extensive nature of the upper extremity injury and possible nerve involvement.  Dr. Onalee Hua, with hand surgeons, was consulted on admission.  DIAGNOSTIC DATA: CBC on admission showed hemoglobin 14.9, hematocrit 42.8, white cell count 6.9, red cell count 4.92, differential within normal limits. PT and PTT on admission 12.7 and 20, respectively, with an INR of 0.9.  BMET on admission showed glucose 288, remaining BMET within normal limits. Followup glucose two days later showed glucose of 273.  Two views of the left knee on July 09, 2001, were negative for fractures.  Left wrist films on July 09, 2001, showed a comminuted distal left radius fracture with displaced ulnar styloid fracture, small radiopaque foreign body in the volar soft tissues of the left  hand, left wrist fractures noted again, no abnormalities in the hand.  Two views of the chest showed extremely low lung volumes with bibasilar atelectasis.  Slight anterior wedging upper lumbar spine.  Vertebral body seen on lateral view.  Recommend for correlation for pain in this area; if there is no pain,  lumbar spine series may be helpful.  Intraoperative C spine films shows two K wires in distal radius for fixation of the comminuted fracture, one K wire placed at the ulnar styloid with surgical wire fracture fragments.  Well positioned with near anatomic position and alignment.  CT scan of the left wrist shows K wire fixation comminuted distal radius fracture as well as left ulnar styloid fracture.  CT multiplane reconstruction to be performed.  Reconstruction views shows two K wires to the comminuted distal left radius fracture.  It appears to traverse one of the fracture planes with the fracture extending more proximal to the proximal extent of the K wire tips.  There are several small bone fragments which may be within the radial carpal joint space.  There is a slight separation of the dominant dorsal and ventral distal left radial fracture fragments with the fracture extending into the radial carpal joint space with separation of approximately 4 mm and slight dorsal tilt of the dorsal fragment.  The fixation K wire in the distal left ulnar styloid fracture appears to be in good position and alignment.  HOSPITAL COURSE:  The patient was admitted to [facility] for the above-stated injuries, taken to the operating room and underwent procedure #1 without difficulty.  The patient was placed on PCA analgesics and IV antibiotics for pain control and postoperative coverage.  The patient was placed into an arm mission sling for elevation.  He was started on high-dose morphine PCA for pain control.  During the hospital course, services were changed from admitted service of Dr. Lestine Box over to Dr. Onalee Hua. Neurovascular checks were performed on the left upper extremity.  Followup knee exam showed that the knee alignments appeared to be stable and felt like the patient strained the left knee.  However, no fracture fragments were identified on plane film.  Due to the open nature of  the left wrist fracture  and also the comminution, the patient was taken back to the OR two days later on February 27 and underwent the above procedure #2.  The patient continued to receive PCA analgesics.  It was noted the patient had an elevated glucose.  It was felt he had new-onset diabetes.  Diabetic team and services were consulted.  There was to be arranged by discharge planning that he may undergo outpatient counselling  Throughout the hospital course, the patient progressed well.  He kept the left arm elevated, and he was later weaned off PCA analgesics over to p.o. analgesics.  The patient was seen on July 13, 2001, which was postop day #2 and #4 in relation to the two procedures.  He was thought to be progressing well.  It was decided the patient would be discharged home the following day once he had received his antibiotics.  On July 15, 2001, the patient was doing quite well, keeping the arm elevated. He was ready to be discharged home.  DISCHARGE PLAN:  The patient was discharged home on June 17, 2001.  DISCHARGE DIAGNOSES:  Please see above.  DISCHARGE MEDICATIONS:  The patient was provided Percocet for pain, Robaxin for spasm, and also Ativan.  DIET:  1800 calorie ADA.  FOLLOWUP:  The patient is to follow up on Thursday or Friday following discharge with Dr. Amanda Pea.  He is to call the office for an appointment at [phone].  ACTIVITY:  Strict elevation of the left hand and wrist.  Encourage range of motion.  CONDITION UPON DISCHARGE:  Improved. Dictated by:   Alexzandrew L. Perkins, P.A.-C. Attending Physician:  Sherri Rad DD:  07/30/01 TD:  07/31/01 Job: [id number]

## 2010-09-30 NOTE — Cardiovascular Report (Signed)
Xavier [name], Xavier [name]                             ACCOUNT NO.:  [id number]   MEDICAL RECORD NO.:  [id number]                   PATIENT TYPE:  OUT   LOCATION:  CATH                                 FACILITY:  [facility]   PHYSICIAN:  Arturo Morton. Riley Kill, M.D. [facility]         DATE OF BIRTH:  07-Feb-1966   DATE OF PROCEDURE:  06/02/2002  DATE OF DISCHARGE:                              CARDIAC CATHETERIZATION   PROCEDURE PERFORMED:  1. Left heart catheterization.  2. Selective coronary arteriography.  3. Selective left ventriculography.  4. Proximal root aortography.   CARDIOLOGIST:  Arturo Morton. Riley Kill, M.D.   INDICATIONS FOR PROCEDURE:  The patient is a 45 year old gentleman with a  strong family history of coronary artery disease, a history of hypertension  and diabetes.  He also has obesity and a low HDL.  He has had some recurrent  chest pain.  He has been under quite a bit of stress.  EKG revealed some ST  flattening in the lateral leads.  Lipid profile revealed elevated  triglycerides with an HDL that is borderline low.  The current study was  done to assess coronary anatomy.   DESCRIPTION OF PROCEDURE:  The procedure was performed from the right  femoral artery using 6 French catheters.  He tolerated the procedure well  and there were no complications.  Importantly, it was difficult to seat the  catheter in the left main artery; we were ultimately able to using a JR3.5  Nordstrom guiding catheter.  Overall, the procedure was tolerated  without complications.  He was taken to the holding area in satisfactory  clinical condition.   HEMODYNAMIC DATA:  1. Central aortic pressure was 126/83, mean was 103.  2. Left ventricular pressure 121/1/4.  3. No aortic left ventricular gradient on pullback across the aorta valve.   ANGIOGRAPHIC DATA:  1. Ventriculography was performed in the RAO projection.  Overall systolic     function was well preserved and no segmental abnormalities or  contractions were identified.  2. The aortic root demonstrated no aortic regurgitation.  There was no     evidence of aortic dissection.  3. The left main coronary artery was short and there was some very mild     tapered narrowing at the ostium; however, this did not appear to be flow-     limiting.  4. The left anterior descending artery coursed through the apex.  After the     two diagonal takeoffs there is a slightly irregular area of about 30-40%     narrowing at the proximal end of the distal vessel.  This did not appear     to be flow-limiting.  5. The circumflex provides a small ramus, a small marginal and then     terminates as a large marginal branch.  The circumflex appears to be     smooth and without significant narrowing.  There is, perhaps, some 20%  narrowing near the ostium.  6. The right coronary artery is a large dominant vessel that appears to be     free of significant disease.  It provides the posterior descending and     posterolateral branches,    CONCLUSION:  1. Preserved left ventricular function.  2. No evidence aortic regurgitation or aortic dissection.  3. Minor coronary abnormalities as noted above with mild left anterior     descending irregularity.   DISPOSITION:  The patient will be transferred back to [facility].  The  patient needs continued follow up and risk factor reduction.                                                 Arturo Morton. Riley Kill, M.D. [facility]    TDS/MEDQ  D:  06/02/2002  T:  06/03/2002  Job:  [id number]   cc:   [facility]  [address]  [place]  [place] [address]  Fax: [phone]   Jesse Sans. Wall, M.D. [facility]  [address]  [place]  [place] [address]  Fax: 1   [facility]

## 2010-09-30 NOTE — H&P (Signed)
[place]  Patient:    Xavier [name], Xavier [name] Visit Number: [id number] MRN: [id number]          Service Type: MED Location: [id number] Attending Physician:  Sherri Rad Dictated by:   Ottie Glazier. Wynona Neat, P.A.-C. Admit Date:  07/09/2001                           History and Physical  CHIEF COMPLAINT:  Left elbow and left knee pain.  HISTORY OF THE PRESENT ILLNESS:  The patient is a 45 year old white male, right-hand dominant, who was brought into the [facility] ER after being involved in an accident at his place of employment.  Apparently, the patient states that he was pulling the backrest of a forklift when he tripped on the cement spacers in the floor.  The backrest of the forklift at this time fell over on him.  He landed directly on his left arm, which got caught between him and the floor, as well as sustained a direct blow to the left knee onto the concrete floor.  Patient states that he initially had tingling of all of his left digits, however, now it has resolved.  The patient noticed an increased amount of pain in the left arm and wrist as well as disability; he is also complaining of left knee pain.  His last tetanus shot was approximately two years ago.  His last meal was approximately 7:30 a.m. this morning in which he ate two cinnamon rolls and a drink.  ALLERGIES:  No known drug allergies.  MEDICATIONS:  No current medications.  PAST MEDICAL HISTORY:  Noncontributory.  SURGICAL HISTORY:  Patient has had an inguinal hernia repair x2 as well as an umbilical hernia repair x1.  SOCIAL HISTORY:  He is married with two children.  No tobacco use.  Occasional alcohol use.  He has no family physician.  REVIEW OF SYSTEMS:  In general, patient states that he is recovering from a recent cold, otherwise, no current fever, chills, weight loss, night sweats. HEENT:  No history of chronic headaches, ringing of the ears, chronic rhinitis or  sore throat.  CHEST:  No history of chronic cough, hemoptysis or productive cough.  CARDIOVASCULAR:  No history of chest pains, angina or shortness of breath or syncopal episode.  GI/GU:  No history of chronic diarrhea, constipation, melena, bright red stools per rectum, dysuria, frequency or urgency.  EXTREMITIES:  Please see HPI.  NEUROLOGIC:  Denies any history of seizures or strokes.  PHYSICAL EXAMINATION:  VITALS:  His blood pressure is 168/98, pulse 88, respirations 20, temperature 97.  GENERAL:  Physical examination reveals a very pleasant 45 year old white male, however, he is uncomfortable.  HEENT:  His head is atraumatic, normocephalic.  NECK:  Supple without masses.  Range of motion with rotation, flexion and extension does not elicit any tenderness.  There is no palpable tenderness along the cervical spine.  CHEST:  Clear to auscultation bilaterally.  HEART:  Regular rate and rhythm.  S1 and S2.  GI/GU:  Soft and nontender.  Bowel sounds are positive.  No guarding.  No rebound.  EXTREMITIES:  The right upper extremity shows good range of motion.  No noted deformity or swelling noted.  Neurovascular is intact.  Right lower extremity shows no obvious deformities, no tenderness with hip range of motion, knee range of motion or ankle range of motion.  Neurovascularly he is intact and appears well.  Left upper extremity:  The left shoulder shows no deformity or swelling, range of motion intact.  He has positive brachial pulses.  There is noted swelling and deformity of the left wrist with small laceration -- approximately 1 cm -- of the lateral inner aspect of the wrist.  No visible bone.  He has good flexion and extension of the fingers.  His sensation is intact.  He has good radial pulse, 2+; ulnar is not palpated secondary to edema.  Left lower extremity:  Patient does have some abrasions noted about the left knee with no gross swelling or deformity noted.  He has  mild palpable tenderness.  No ligamentous instability noted.  ASSESSMENT:  He has a left comminuted radial fracture with an open ulnar styloid fracture with a foreign body noted in the soft tissue.  PLAN:  Patient will be admitted.  He will proceed to the OR where he will undergo an I&D of the left wrist as well as external fixation with possible ORIF of the left wrist per Dr. Sherri Rad with Dr. Onalee Hua III consulting.  Postoperatively, he will be placed on IV antibiotics and followed closely through for any subsequent surgeries that may need to be performed. The risks and benefits of this surgery have been discussed with patient, to which he states good understanding prior to entering the operating suite. Dictated by:   Ottie Glazier. Wynona Neat, P.A.-C. Attending Physician:  Sherri Rad DD:  07/12/01 TD:  07/12/01 Job: [id number]

## 2010-10-07 ENCOUNTER — Encounter: Payer: Self-pay | Admitting: Endocrinology

## 2010-10-07 ENCOUNTER — Ambulatory Visit: Payer: Medicare Other

## 2010-10-07 ENCOUNTER — Ambulatory Visit (INDEPENDENT_AMBULATORY_CARE_PROVIDER_SITE_OTHER): Payer: Medicare Other | Admitting: Endocrinology

## 2010-10-07 VITALS — BP 124/80 | HR 71 | Temp 97.7°F | Ht 72.0 in | Wt 274.8 lb

## 2010-10-07 DIAGNOSIS — Z23 Encounter for immunization: Secondary | ICD-10-CM

## 2010-10-07 DIAGNOSIS — D510 Vitamin B12 deficiency anemia due to intrinsic factor deficiency: Secondary | ICD-10-CM

## 2010-10-07 DIAGNOSIS — Z0000 Encounter for general adult medical examination without abnormal findings: Secondary | ICD-10-CM

## 2010-10-07 MED ORDER — SIMVASTATIN 40 MG PO TABS: 40.0000 mg | ORAL_TABLET | Freq: Every day | ORAL | Status: DC

## 2010-10-07 MED ORDER — CYANOCOBALAMIN 1000 MCG/ML IJ SOLN
1000.0000 ug | Freq: Once | INTRAMUSCULAR | Status: AC
  Administered 2010-10-07: 1000 ug via INTRAMUSCULAR

## 2010-10-07 NOTE — Patient Instructions (Addendum)
Reduce simvastatin to 40 mg at bedtime. please consider these measures for your health:  minimize alcohol.  do not use tobacco products.  have a colonoscopy at least every 10 years from age 45.  keep firearms safely stored.  always use seat belts.  have working smoke alarms in your home.  see an eye doctor and dentist regularly.  never drive under the influence of alcohol or drugs (including prescription drugs).  those with fair skin should take precautions against the sun. good diet and exercise habits significanly improve the control of your diabetes.  please let me know if you wish to be referred to a dietician.  high blood sugar is very risky to your health.  you should see an eye doctor every year. please let me know what your wishes would be, if artificial life support measures should become necessary.  it is critically important to prevent falling down (keep floor areas well-lit, dry, and free of loose objects) controlling your blood pressure and cholesterol drastically reduces the damage diabetes does to your body.  this also applies to quitting smoking.  please discuss these with your doctor.  you should take an aspirin every day, unless you have been advised by a doctor not to. check your blood sugar 1 time a day.  vary the time of day when you check, between before the 3 meals, and at bedtime.  also check if you have symptoms of your blood sugar being too high or too low.  please keep a record of the readings and bring it to your next appointment here.  please call us sooner if you are having low blood sugar episodes. Please make a follow-up appointment in 4 months. (update: we discussed code status.  pt requests full code, but would not want to be started or maintained on artificial life-support measures if there was not a reasonable chance of recovery).

## 2010-10-07 NOTE — Progress Notes (Signed)
Subjective:    Patient ID: Xavier [name], male    DOB: Oct 12, 1965, 45 y.o.   MRN: [id number]  HPI Subjective:   Patient here for Medicare annual wellness visit and management of other chronic and acute problems.     Risk factors: diabetes  Roster of Physicians Providing Medical Care to Patient: Opthal: does not know. Cardiol: wall Psych: kaur  Activities of Daily Living: In your present state of health, do you have any difficulty performing the following activities?:  Preparing food and eating?: No  Bathing yourself: No  Getting dressed: No  Using the toilet:No  Moving around from place to place: No  In the past year have you fallen or had a near fall?: No    Home Safety: Has smoke detector and wears seat belts. Keeps firearms safely stored. No excess sun exposure.  Diet and Exercise  Current exercise habits: pt says not good  Dietary issues discussed: pt says his diet is "fair."   Depression Screen  Q1: Over the past two weeks, have you felt down, depressed or hopeless? Pt says control is improved recently. Q2: Over the past two weeks, have you felt little interest or pleasure in doing things? no   The following portions of the patient's history were reviewed and updated as appropriate: allergies, current medications, past family history, past medical history, past social history, past surgical history and problem list.  Past Medical History  Diagnosis Date  . CORONARY ARTERY DISEASE 12/05/2006  . DIABETES MELLITUS, TYPE II 12/05/2006  . HYPERCHOLESTEROLEMIA 07/17/2007  . ANEMIA, PERNICIOUS 04/03/2007  . HYPERTENSION 12/05/2006  . VISUAL ACUITY, DECREASED, RIGHT EYE 02/19/2009  . Learning disability     Past Surgical History  Procedure Date  . Repair left hand     History   Social History  . Marital Status: Married    Spouse Name: N/A    Number of Children: N/A  . Years of Education: N/A   Occupational History  . Disabled    Social History Main Topics  .  Smoking status: Former Games developer  . Smokeless tobacco: Not on file  . Alcohol Use: Not on file  . Drug Use: Not on file  . Sexually Active: Not on file   Other Topics Concern  . Not on file   Social History Narrative   Disabled due to left wrist injury at work    Current Outpatient Prescriptions on File Prior to Visit  Medication Sig Dispense Refill  . aspirin 81 MG tablet Take 81 mg by mouth daily.        . cyanocobalamin (,VITAMIN B-12,) 1000 MCG/ML injection 1 injection every month       . diazepam (VALIUM) 5 MG tablet Take 1 three times a day-Dr Evelene Croon       . DULoxetine (CYMBALTA) 60 MG capsule Take 60 mg by mouth daily.        Marland Kitchen glucose blood (ONE TOUCH ULTRA TEST) test strip Once daily dx 250.00       . losartan (COZAAR) 50 MG tablet Take 50 mg by mouth daily.        . metFORMIN (GLUCOPHAGE-XR) 500 MG 24 hr tablet 2 tablets two times a day       . pioglitazone (ACTOS) 45 MG tablet Take 45 mg by mouth daily.        Marland Kitchen pyridoxine (B-6) 100 MG tablet Take 100 mg by mouth daily.        . sitaGLIPtan (JANUVIA) 100 MG tablet  Take 100 mg by mouth every morning.        Marland Kitchen DISCONTD: simvastatin (ZOCOR) 80 MG tablet Take 80 mg by mouth daily.         No current facility-administered medications on file prior to visit.    Allergies  Allergen Reactions  . Niacin     Family History  Problem Relation Age of Onset  . COPD Mother   . Lymphoma Father     BP 124/80  Pulse 71  Temp(Src) 97.7 F (36.5 C) (Oral)  Ht 6' (1.829 m)  Wt 274 lb 12.8 oz (124.648 kg)  BMI 37.27 kg/m2  SpO2 97%   Review of Systems  Denies hearing loss, and visual loss Objective:   Vision:  Needs to see opthalmologist Hearing: grossly normal Body mass index:  Se vs page Msk: pt easily and quickly performs "get-up-and-go" from a sitting position Cognitive Impairment Assessment: cognition, memory and judgment appear normal.  remembers 3/3 at 5 minutes.  excellent recall.  can easily read and write a  sentence.  alert and oriented x 3   Assessment:   Medicare wellness utd on preventive parameters    Plan:   During the course of the visit the patient was educated and counseled about appropriate screening and preventive services including:       Fall prevention   Diabetes screening  Nutrition counseling     Patient Instructions (the written plan) was given to the patient.         Review of Systems     Objective:   Physical Exam        Assessment & Plan:  Pulses: dorsalis pedis intact bilat.   Feet: no deformity.  no ulcer on the feet.  feet are of normal color and temp.  no edema.   Neuro: sensation is intact to touch on the feet.

## 2010-11-07 ENCOUNTER — Ambulatory Visit (INDEPENDENT_AMBULATORY_CARE_PROVIDER_SITE_OTHER): Payer: Medicare Other

## 2010-11-07 DIAGNOSIS — D510 Vitamin B12 deficiency anemia due to intrinsic factor deficiency: Secondary | ICD-10-CM

## 2010-11-07 MED ORDER — CYANOCOBALAMIN 1000 MCG/ML IJ SOLN
1000.0000 ug | Freq: Once | INTRAMUSCULAR | Status: AC
  Administered 2010-11-07: 1000 ug via INTRAMUSCULAR

## 2010-11-12 ENCOUNTER — Emergency Department (HOSPITAL_COMMUNITY)
Admission: EM | Admit: 2010-11-12 | Discharge: 2010-11-12 | Disposition: A | Discharge status: Home / Self Care | Payer: Medicare Other | Attending: Emergency Medicine | Admitting: Emergency Medicine

## 2010-11-12 DIAGNOSIS — T63461A Toxic effect of venom of wasps, accidental (unintentional), initial encounter: Secondary | ICD-10-CM | POA: Insufficient documentation

## 2010-11-12 DIAGNOSIS — R0989 Other specified symptoms and signs involving the circulatory and respiratory systems: Secondary | ICD-10-CM | POA: Insufficient documentation

## 2010-11-12 DIAGNOSIS — Z8673 Personal history of transient ischemic attack (TIA), and cerebral infarction without residual deficits: Secondary | ICD-10-CM | POA: Insufficient documentation

## 2010-11-12 DIAGNOSIS — Z7982 Long term (current) use of aspirin: Secondary | ICD-10-CM | POA: Insufficient documentation

## 2010-11-12 DIAGNOSIS — E669 Obesity, unspecified: Secondary | ICD-10-CM | POA: Insufficient documentation

## 2010-11-12 DIAGNOSIS — T6391XA Toxic effect of contact with unspecified venomous animal, accidental (unintentional), initial encounter: Secondary | ICD-10-CM | POA: Insufficient documentation

## 2010-11-12 DIAGNOSIS — R0609 Other forms of dyspnea: Secondary | ICD-10-CM | POA: Insufficient documentation

## 2010-11-12 DIAGNOSIS — Z79899 Other long term (current) drug therapy: Secondary | ICD-10-CM | POA: Insufficient documentation

## 2010-11-12 DIAGNOSIS — E039 Hypothyroidism, unspecified: Secondary | ICD-10-CM | POA: Insufficient documentation

## 2010-11-12 DIAGNOSIS — I10 Essential (primary) hypertension: Secondary | ICD-10-CM | POA: Insufficient documentation

## 2010-11-12 DIAGNOSIS — E119 Type 2 diabetes mellitus without complications: Secondary | ICD-10-CM | POA: Insufficient documentation

## 2010-11-12 DIAGNOSIS — H5789 Other specified disorders of eye and adnexa: Secondary | ICD-10-CM | POA: Insufficient documentation

## 2010-11-12 DIAGNOSIS — IMO0002 Reserved for concepts with insufficient information to code with codable children: Secondary | ICD-10-CM | POA: Insufficient documentation

## 2010-11-12 DIAGNOSIS — W11XXXA Fall on and from ladder, initial encounter: Secondary | ICD-10-CM | POA: Insufficient documentation

## 2010-11-12 DIAGNOSIS — R220 Localized swelling, mass and lump, head: Secondary | ICD-10-CM | POA: Insufficient documentation

## 2010-11-12 DIAGNOSIS — I252 Old myocardial infarction: Secondary | ICD-10-CM | POA: Insufficient documentation

## 2010-11-12 DIAGNOSIS — E7800 Pure hypercholesterolemia, unspecified: Secondary | ICD-10-CM | POA: Insufficient documentation

## 2010-12-07 ENCOUNTER — Ambulatory Visit (INDEPENDENT_AMBULATORY_CARE_PROVIDER_SITE_OTHER): Payer: Medicare Other | Admitting: *Deleted

## 2010-12-07 DIAGNOSIS — D510 Vitamin B12 deficiency anemia due to intrinsic factor deficiency: Secondary | ICD-10-CM

## 2011-01-05 ENCOUNTER — Other Ambulatory Visit: Payer: Self-pay | Admitting: Endocrinology

## 2011-01-09 ENCOUNTER — Ambulatory Visit: Payer: Medicare Other

## 2011-01-21 ENCOUNTER — Other Ambulatory Visit: Payer: Self-pay | Admitting: Endocrinology

## 2011-01-24 DIAGNOSIS — D510 Vitamin B12 deficiency anemia due to intrinsic factor deficiency: Secondary | ICD-10-CM

## 2011-01-24 MED ORDER — CYANOCOBALAMIN 1000 MCG/ML IJ SOLN
1000.0000 ug | Freq: Once | INTRAMUSCULAR | Status: AC
  Administered 2011-01-24: 1000 ug via INTRAMUSCULAR

## 2011-01-25 ENCOUNTER — Ambulatory Visit (INDEPENDENT_AMBULATORY_CARE_PROVIDER_SITE_OTHER): Payer: Medicare Other | Admitting: *Deleted

## 2011-01-25 DIAGNOSIS — D510 Vitamin B12 deficiency anemia due to intrinsic factor deficiency: Secondary | ICD-10-CM

## 2011-01-25 MED ORDER — CYANOCOBALAMIN 1000 MCG/ML IJ SOLN
1000.0000 ug | Freq: Once | INTRAMUSCULAR | Status: AC
  Administered 2011-01-25: 1000 ug via INTRAMUSCULAR

## 2011-02-10 ENCOUNTER — Ambulatory Visit: Payer: Medicare Other | Admitting: Endocrinology

## 2011-02-13 ENCOUNTER — Ambulatory Visit (INDEPENDENT_AMBULATORY_CARE_PROVIDER_SITE_OTHER)
Admission: RE | Admit: 2011-02-13 | Discharge: 2011-02-13 | Disposition: A | Discharge status: Home / Self Care | Payer: Medicare Other | Source: Ambulatory Visit | Attending: Endocrinology | Admitting: Endocrinology

## 2011-02-13 ENCOUNTER — Encounter: Payer: Self-pay | Admitting: Endocrinology

## 2011-02-13 ENCOUNTER — Ambulatory Visit (INDEPENDENT_AMBULATORY_CARE_PROVIDER_SITE_OTHER): Payer: Medicare Other | Admitting: Endocrinology

## 2011-02-13 ENCOUNTER — Other Ambulatory Visit (INDEPENDENT_AMBULATORY_CARE_PROVIDER_SITE_OTHER): Payer: Medicare Other

## 2011-02-13 VITALS — BP 122/84 | HR 87 | Temp 98.6°F | Ht 72.0 in | Wt 274.0 lb

## 2011-02-13 DIAGNOSIS — R0989 Other specified symptoms and signs involving the circulatory and respiratory systems: Secondary | ICD-10-CM

## 2011-02-13 DIAGNOSIS — E7800 Pure hypercholesterolemia, unspecified: Secondary | ICD-10-CM

## 2011-02-13 DIAGNOSIS — R0689 Other abnormalities of breathing: Secondary | ICD-10-CM

## 2011-02-13 DIAGNOSIS — J45909 Unspecified asthma, uncomplicated: Secondary | ICD-10-CM

## 2011-02-13 DIAGNOSIS — E119 Type 2 diabetes mellitus without complications: Secondary | ICD-10-CM

## 2011-02-13 DIAGNOSIS — J069 Acute upper respiratory infection, unspecified: Secondary | ICD-10-CM

## 2011-02-13 LAB — HEMOGLOBIN A1C: Hgb A1c MFr Bld: 5.8 % (ref 4.6–6.5)

## 2011-02-13 MED ORDER — PROMETHAZINE-CODEINE 6.25-10 MG/5ML PO SYRP: 5.0000 mL | ORAL_SOLUTION | ORAL | Status: AC | PRN

## 2011-02-13 MED ORDER — SIMVASTATIN 40 MG PO TABS: 40.0000 mg | ORAL_TABLET | Freq: Every day | ORAL | Status: DC

## 2011-02-13 MED ORDER — DOXYCYCLINE HYCLATE 100 MG PO TABS: 100.0000 mg | ORAL_TABLET | Freq: Two times a day (BID) | ORAL | Status: AC

## 2011-02-13 NOTE — Progress Notes (Signed)
  Subjective:    Patient ID: Xavier [name], male    DOB: 11-19-1965, 45 y.o.   MRN: [id number]  HPI Pt states 1 week of prod-quality cough in the chest, but no assoc sob.  No nasal congestion.   Past Medical History  Diagnosis Date  . CORONARY ARTERY DISEASE 12/05/2006  . DIABETES MELLITUS, TYPE II 12/05/2006  . HYPERCHOLESTEROLEMIA 07/17/2007  . ANEMIA, PERNICIOUS 04/03/2007  . HYPERTENSION 12/05/2006  . VISUAL ACUITY, DECREASED, RIGHT EYE 02/19/2009  . Learning disability     Past Surgical History  Procedure Date  . Repair left hand     History   Social History  . Marital Status: Married    Spouse Name: N/A    Number of Children: N/A  . Years of Education: N/A   Occupational History  . Disabled    Social History Main Topics  . Smoking status: Former Games developer  . Smokeless tobacco: Not on file  . Alcohol Use: Not on file  . Drug Use: Not on file  . Sexually Active: Not on file   Other Topics Concern  . Not on file   Social History Narrative   Disabled due to left wrist injury at work    Current Outpatient Prescriptions on File Prior to Visit  Medication Sig Dispense Refill  . ACTOS 45 MG tablet TAKE 1 TABLET BY MOUTH EVERY DAY  30 tablet  5  . aspirin 81 MG tablet Take 81 mg by mouth daily.        . cyanocobalamin (,VITAMIN B-12,) 1000 MCG/ML injection 1 injection every month       . diazepam (VALIUM) 5 MG tablet Take 1 three times a day-Dr Evelene Croon       . glucose blood (ONE TOUCH ULTRA TEST) test strip Once daily dx 250.00       . JANUVIA 100 MG tablet TAKE 1 TABLET BY MOUTH EVERY MORNING  30 tablet  5  . losartan (COZAAR) 50 MG tablet Take 50 mg by mouth daily.        . metFORMIN (GLUCOPHAGE-XR) 500 MG 24 hr tablet TAKE 2 TABLETS TWICE A DAY  120 tablet  5  . pyridoxine (B-6) 100 MG tablet Take 100 mg by mouth daily.          Allergies  Allergen Reactions  . Niacin     Family History  Problem Relation Age of Onset  . COPD Mother   . Lymphoma Father    BP  122/84  Pulse 87  Temp(Src) 98.6 F (37 C) (Oral)  Ht 6' (1.829 m)  Wt 274 lb (124.286 kg)  BMI 37.16 kg/m2  SpO2 97%  Review of Systems Denies fever.  He has wheezing.      Objective:   Physical Exam VITAL SIGNS:  See vs page GENERAL: no distress head: no deformity eyes: no periorbital swelling, no proptosis external nose and ears are normal mouth: no lesion seen Both eac's and tm's are normal LUNGS:  Clear to auscultation, except for decreased breath sounds at the right base.    Lab Results  Component Value Date   HGBA1C 5.8 02/13/2011       Assessment & Plan:  Xavier [name], new Mild asthma, due to the uri Dm, well-controlled

## 2011-02-13 NOTE — Patient Instructions (Addendum)
blood tests, and a chest-x-ray, are being requested for you today.  please call [phone] to hear each of your test results.  You will be prompted to enter the 9-digit "MRN" number that appears at the top left of this page, followed by #.  Then you will hear the message. Please come back for a follow-up appointment in 6 months.  i have sent a prescription to your pharmacy, for an antibiotic. here is a sample of "dulera-200."  take 1 puff 2x a day, until the wheezing is gone.  rinse mouth after using. I hope you feel better soon.  If you don't feel better by next week, please call your doctor. Here is a prescription for cough syrup.   (update: i left message on phone-tree:  rx as we discussed)

## 2011-02-14 ENCOUNTER — Encounter: Payer: Self-pay | Admitting: Endocrinology

## 2011-02-21 LAB — URINALYSIS, ROUTINE W REFLEX MICROSCOPIC
Bilirubin Urine: NEGATIVE
Glucose, UA: NEGATIVE
Hgb urine dipstick: NEGATIVE
Ketones, ur: NEGATIVE
Nitrite: NEGATIVE
Protein, ur: NEGATIVE
Specific Gravity, Urine: 1.028
Urobilinogen, UA: 1
pH: 6.5

## 2011-02-21 LAB — POCT URINE HEMOGLOBIN
Hgb urine dipstick: NEGATIVE
Operator id: 28069

## 2011-03-03 ENCOUNTER — Ambulatory Visit (INDEPENDENT_AMBULATORY_CARE_PROVIDER_SITE_OTHER): Payer: Medicare Other

## 2011-03-03 DIAGNOSIS — D510 Vitamin B12 deficiency anemia due to intrinsic factor deficiency: Secondary | ICD-10-CM

## 2011-03-03 MED ORDER — CYANOCOBALAMIN 1000 MCG/ML IJ SOLN
1000.0000 ug | Freq: Once | INTRAMUSCULAR | Status: AC
  Administered 2011-03-03: 1000 ug via INTRAMUSCULAR

## 2011-03-31 ENCOUNTER — Ambulatory Visit: Payer: Medicare Other

## 2011-03-31 DIAGNOSIS — D510 Vitamin B12 deficiency anemia due to intrinsic factor deficiency: Secondary | ICD-10-CM

## 2011-03-31 MED ORDER — CYANOCOBALAMIN 1000 MCG/ML IJ SOLN
1000.0000 ug | Freq: Once | INTRAMUSCULAR | Status: AC
  Administered 2011-03-31: 1000 ug via INTRAMUSCULAR

## 2011-05-01 ENCOUNTER — Ambulatory Visit (INDEPENDENT_AMBULATORY_CARE_PROVIDER_SITE_OTHER): Payer: Medicare Other | Admitting: *Deleted

## 2011-05-01 DIAGNOSIS — D510 Vitamin B12 deficiency anemia due to intrinsic factor deficiency: Secondary | ICD-10-CM

## 2011-05-01 MED ORDER — CYANOCOBALAMIN 1000 MCG/ML IJ SOLN
1000.0000 ug | Freq: Once | INTRAMUSCULAR | Status: AC
  Administered 2011-05-01: 1000 ug via INTRAMUSCULAR

## 2011-06-02 ENCOUNTER — Ambulatory Visit: Payer: Medicare Other

## 2011-06-05 ENCOUNTER — Ambulatory Visit (INDEPENDENT_AMBULATORY_CARE_PROVIDER_SITE_OTHER): Payer: Medicare Other | Admitting: *Deleted

## 2011-06-05 DIAGNOSIS — D510 Vitamin B12 deficiency anemia due to intrinsic factor deficiency: Secondary | ICD-10-CM | POA: Diagnosis not present

## 2011-06-05 MED ORDER — CYANOCOBALAMIN 1000 MCG/ML IJ SOLN
1000.0000 ug | Freq: Once | INTRAMUSCULAR | Status: AC
  Administered 2011-06-05: 1000 ug via INTRAMUSCULAR

## 2011-07-07 ENCOUNTER — Ambulatory Visit: Payer: Medicare Other

## 2011-07-19 ENCOUNTER — Other Ambulatory Visit: Payer: Self-pay | Admitting: Endocrinology

## 2011-07-22 ENCOUNTER — Other Ambulatory Visit: Payer: Self-pay | Admitting: Endocrinology

## 2011-08-19 ENCOUNTER — Other Ambulatory Visit: Payer: Self-pay | Admitting: Endocrinology

## 2012-02-14 ENCOUNTER — Other Ambulatory Visit: Payer: Self-pay | Admitting: Endocrinology

## 2012-03-25 ENCOUNTER — Other Ambulatory Visit: Payer: Self-pay | Admitting: Endocrinology

## 2012-08-23 ENCOUNTER — Other Ambulatory Visit: Payer: Self-pay | Admitting: *Deleted

## 2012-08-23 MED ORDER — SITAGLIPTIN PHOSPHATE 100 MG PO TABS: 100.0000 mg | ORAL_TABLET | Freq: Every day | ORAL | Status: DC

## 2012-08-23 MED ORDER — LOSARTAN POTASSIUM 50 MG PO TABS: 50.0000 mg | ORAL_TABLET | Freq: Every day | ORAL | Status: DC

## 2012-09-24 ENCOUNTER — Encounter: Payer: Self-pay | Admitting: Endocrinology

## 2012-09-24 ENCOUNTER — Telehealth: Payer: Self-pay | Admitting: Endocrinology

## 2012-09-24 ENCOUNTER — Ambulatory Visit: Payer: Medicare Other

## 2012-09-24 ENCOUNTER — Other Ambulatory Visit: Payer: Self-pay

## 2012-09-24 ENCOUNTER — Ambulatory Visit (INDEPENDENT_AMBULATORY_CARE_PROVIDER_SITE_OTHER): Payer: Medicare Other | Admitting: Endocrinology

## 2012-09-24 ENCOUNTER — Other Ambulatory Visit: Payer: Self-pay | Admitting: *Deleted

## 2012-09-24 VITALS — BP 134/80 | HR 80 | Ht 72.0 in | Wt 289.0 lb

## 2012-09-24 DIAGNOSIS — Z125 Encounter for screening for malignant neoplasm of prostate: Secondary | ICD-10-CM

## 2012-09-24 DIAGNOSIS — D510 Vitamin B12 deficiency anemia due to intrinsic factor deficiency: Secondary | ICD-10-CM | POA: Diagnosis not present

## 2012-09-24 DIAGNOSIS — Z79899 Other long term (current) drug therapy: Secondary | ICD-10-CM | POA: Diagnosis not present

## 2012-09-24 DIAGNOSIS — I10 Essential (primary) hypertension: Secondary | ICD-10-CM

## 2012-09-24 DIAGNOSIS — E119 Type 2 diabetes mellitus without complications: Secondary | ICD-10-CM

## 2012-09-24 DIAGNOSIS — E7800 Pure hypercholesterolemia, unspecified: Secondary | ICD-10-CM

## 2012-09-24 LAB — URINALYSIS, ROUTINE W REFLEX MICROSCOPIC
Bilirubin Urine: NEGATIVE
Hgb urine dipstick: NEGATIVE
Ketones, ur: NEGATIVE
Leukocytes, UA: NEGATIVE
Nitrite: NEGATIVE
Specific Gravity, Urine: >=1.03 (ref 1.000–1.030)
Total Protein, Urine: NEGATIVE
Urine Glucose: NEGATIVE
Urobilinogen, UA: 0.2 (ref 0.0–1.0)
pH: 6 (ref 5.0–8.0)

## 2012-09-24 LAB — BASIC METABOLIC PANEL
BUN: 15 mg/dL (ref 6–23)
CO2: 28 mEq/L (ref 19–32)
Calcium: 10 mg/dL (ref 8.4–10.5)
Chloride: 103 mEq/L (ref 96–112)
Creatinine, Ser: 0.9 mg/dL (ref 0.4–1.5)
GFR: 92.87 mL/min (ref >60.00)
Glucose, Bld: 118 mg/dL — ABNORMAL HIGH (ref 70–99)
Potassium: 4.7 mEq/L (ref 3.5–5.1)
Sodium: 137 mEq/L (ref 135–145)

## 2012-09-24 LAB — CBC WITH DIFFERENTIAL/PLATELET
Basophils Absolute: 0.1 10*3/uL (ref 0.0–0.1)
Basophils Relative: 1 % (ref 0.0–3.0)
Eosinophils Absolute: 0.2 10*3/uL (ref 0.0–0.7)
Eosinophils Relative: 2.4 % (ref 0.0–5.0)
HCT: 41.7 % (ref 39.0–52.0)
Hemoglobin: 14.3 g/dL (ref 13.0–17.0)
Lymphocytes Relative: 22.2 % (ref 12.0–46.0)
Lymphs Abs: 1.4 10*3/uL (ref 0.7–4.0)
MCHC: 34.3 g/dL (ref 30.0–36.0)
MCV: 90.7 fl (ref 78.0–100.0)
Monocytes Absolute: 0.6 10*3/uL (ref 0.1–1.0)
Monocytes Relative: 8.8 % (ref 3.0–12.0)
Neutro Abs: 4.3 10*3/uL (ref 1.4–7.7)
Neutrophils Relative %: 65.6 % (ref 43.0–77.0)
Platelets: 361 10*3/uL (ref 150.0–400.0)
RBC: 4.6 Mil/uL (ref 4.22–5.81)
RDW: 13.3 % (ref 11.5–14.6)
WBC: 6.5 10*3/uL (ref 4.5–10.5)

## 2012-09-24 LAB — MICROALBUMIN / CREATININE URINE RATIO
Creatinine,U: 253.9 mg/dL
Microalb Creat Ratio: 1.9 mg/g (ref 0.0–30.0)
Microalb, Ur: 4.8 mg/dL — ABNORMAL HIGH (ref 0.0–1.9)

## 2012-09-24 LAB — HEPATIC FUNCTION PANEL
ALT: 21 U/L (ref 0–53)
AST: 15 U/L (ref 0–37)
Albumin: 4 g/dL (ref 3.5–5.2)
Alkaline Phosphatase: 67 U/L (ref 39–117)
Bilirubin, Direct: 0.1 mg/dL (ref 0.0–0.3)
Total Bilirubin: 0.4 mg/dL (ref 0.3–1.2)
Total Protein: 7.5 g/dL (ref 6.0–8.3)

## 2012-09-24 LAB — LDL CHOLESTEROL, DIRECT: Direct LDL: 141 mg/dL

## 2012-09-24 LAB — PSA, MEDICARE: PSA: 0.21 ng/ml (ref 0.10–4.00)

## 2012-09-24 LAB — LIPID PANEL
Cholesterol: 232 mg/dL — ABNORMAL HIGH (ref 0–200)
HDL: 45.3 mg/dL (ref >39.00)
Total CHOL/HDL Ratio: 5
Triglycerides: 276 mg/dL — ABNORMAL HIGH (ref 0.0–149.0)
VLDL: 55.2 mg/dL — ABNORMAL HIGH (ref 0.0–40.0)

## 2012-09-24 LAB — HEMOGLOBIN A1C: Hgb A1c MFr Bld: 6.4 % (ref 4.6–6.5)

## 2012-09-24 LAB — TSH: TSH: 1.83 u[IU]/mL (ref 0.35–5.50)

## 2012-09-24 MED ORDER — METFORMIN HCL ER 500 MG PO TB24: 1000.0000 mg | ORAL_TABLET | Freq: Two times a day (BID) | ORAL | Status: DC

## 2012-09-24 MED ORDER — CYANOCOBALAMIN 1000 MCG/ML IJ SOLN: 1000.0000 ug | Freq: Once | INTRAMUSCULAR | Status: DC

## 2012-09-24 MED ORDER — GLUCOSE BLOOD VI STRP: 1.0000 | ORAL_STRIP | Freq: Every day | Status: DC

## 2012-09-24 MED ORDER — LOSARTAN POTASSIUM 50 MG PO TABS: 50.0000 mg | ORAL_TABLET | Freq: Every day | ORAL | Status: DC

## 2012-09-24 MED ORDER — DIAZEPAM 5 MG PO TABS: 5.0000 mg | ORAL_TABLET | Freq: Three times a day (TID) | ORAL | Status: DC | PRN

## 2012-09-24 NOTE — Patient Instructions (Addendum)
blood tests are being requested for you today.  We'll contact you with results. check your blood sugar once a day.  vary the time of day when you check, between before the 3 meals, and at bedtime.  also check if you have symptoms of your blood sugar being too high or too low.  please keep a record of the readings and bring it to your next appointment here.  please call us sooner if your blood sugar goes below 70, or if you have a lot of readings over 200. Please come in soon for a "medicare wellness" visit.    Addendum: cc dr Evelene Croon

## 2012-09-24 NOTE — Progress Notes (Signed)
Subjective:    Patient ID: Xavier [name], male    DOB: [birth year], 47 y.o.   MRN: [id number]  HPI Pt returns for f/u of type 2 dm (dx'ed 2005; complicated by CAD).  no cbg record, but states cbg's are well-controlled.  denies hypoglycemia.   Dyslipidemia: he denies weight change.   D\HTN: he denies SOB.   Past Medical History  Diagnosis Date  . CORONARY ARTERY DISEASE 12/05/2006  . DIABETES MELLITUS, TYPE II 12/05/2006  . HYPERCHOLESTEROLEMIA 07/17/2007  . ANEMIA, PERNICIOUS 04/03/2007  . HYPERTENSION 12/05/2006  . VISUAL ACUITY, DECREASED, RIGHT EYE 02/19/2009  . Learning disability     Past Surgical History  Procedure Laterality Date  . Repair left hand      History   Social History  . Marital Status: Married    Spouse Name: N/A    Number of Children: N/A  . Years of Education: N/A   Occupational History  . Disabled    Social History Main Topics  . Smoking status: Former Games developer  . Smokeless tobacco: Not on file  . Alcohol Use: Not on file  . Drug Use: Not on file  . Sexually Active: Not on file   Other Topics Concern  . Not on file   Social History Narrative   Disabled due to left wrist injury at work    Current Outpatient Prescriptions on File Prior to Visit  Medication Sig Dispense Refill  . aspirin 81 MG tablet Take 81 mg by mouth daily.        . cyanocobalamin (,VITAMIN B-12,) 1000 MCG/ML injection 1 injection every month       . cyanocobalamin 500 MCG tablet Take 500 mcg by mouth daily.        . DULoxetine (CYMBALTA) 30 MG capsule Take 30 mg by mouth daily.        Marland Kitchen FLUoxetine (PROZAC) 40 MG capsule Take 40 mg by mouth daily.        Marland Kitchen glucose blood (ONE TOUCH ULTRA TEST) test strip Once daily dx 250.00       . pioglitazone (ACTOS) 45 MG tablet TAKE 1 TABLET BY MOUTH EVERY DAY  30 tablet  5  . pyridoxine (B-6) 100 MG tablet Take 100 mg by mouth daily.        . sitaGLIPtin (JANUVIA) 100 MG tablet Take 1 tablet (100 mg total) by mouth daily.  30 tablet  1    No current facility-administered medications on file prior to visit.   Allergies  Allergen Reactions  . Niacin    Family History  Problem Relation Age of Onset  . COPD Mother   . Lymphoma Father    BP 134/80  Pulse 80  Ht 6' (1.829 m)  Wt 289 lb (131.09 kg)  BMI 39.19 kg/m2  SpO2 98%  Review of Systems Denies chest pain.  He reports slight edema    Objective:   Physical Exam VITAL SIGNS:  See vs page GENERAL: no distress  Lab Results  Component Value Date   WBC 6.5 09/24/2012   HGB 14.3 09/24/2012   HCT 41.7 09/24/2012   PLT 361.0 09/24/2012   GLUCOSE 118* 09/24/2012   CHOL 232* 09/24/2012   TRIG 276.0* 09/24/2012   HDL 45.30 09/24/2012   LDLDIRECT 141.0 09/24/2012   LDLCALC  Value: 120        Total Cholesterol/HDL:CHD Risk Coronary Heart Disease Risk Table  Men   Women  1/2 Average Risk   3.4   3.3  Average Risk       5.0   4.4  2 X Average Risk   9.6   7.1  3 X Average Risk  23.4   11.0        Use the calculated Patient Ratio above and the CHD Risk Table to determine the patient's CHD Risk.        ATP III CLASSIFICATION (LDL):  <100     mg/dL   Optimal  161-096  mg/dL   Near or Above                    Optimal  130-159  mg/dL   Borderline  045-409  mg/dL   High  >811     mg/dL   Very High* 01/27/7828   ALT 21 09/24/2012   AST 15 09/24/2012   NA 137 09/24/2012   K 4.7 09/24/2012   CL 103 09/24/2012   CREATININE 0.9 09/24/2012   BUN 15 09/24/2012   CO2 28 09/24/2012   TSH 1.83 09/24/2012   PSA 0.21 09/24/2012   INR 0.89 06/10/2010   HGBA1C 6.4 09/24/2012   MICROALBUR 4.8* 09/24/2012      Assessment & Plan:  DM: well-controlled Dyslipidemia, needs increased rx HTN: well-controlled

## 2012-09-24 NOTE — Telephone Encounter (Signed)
Patient's wife called requesting a refill of his valium 5mg  1p tid sent to [facility] pharmacy as he could not get an appointment with the prescribing physician. Please advise.

## 2012-09-24 NOTE — Telephone Encounter (Signed)
LVOM will fax rx to [facility]

## 2012-09-24 NOTE — Telephone Encounter (Signed)
i printed refill x 1 Further refills must come from dr Evelene Croon

## 2012-09-25 MED ORDER — SIMVASTATIN 40 MG PO TABS: 40.0000 mg | ORAL_TABLET | Freq: Every day | ORAL | Status: DC

## 2012-09-26 ENCOUNTER — Other Ambulatory Visit: Payer: Self-pay | Admitting: *Deleted

## 2012-09-26 MED ORDER — PIOGLITAZONE HCL 45 MG PO TABS: 45.0000 mg | ORAL_TABLET | Freq: Every day | ORAL | Status: DC

## 2012-10-23 ENCOUNTER — Other Ambulatory Visit: Payer: Self-pay

## 2012-10-23 MED ORDER — SITAGLIPTIN PHOSPHATE 100 MG PO TABS: 100.0000 mg | ORAL_TABLET | Freq: Every day | ORAL | Status: DC

## 2012-10-25 ENCOUNTER — Ambulatory Visit: Payer: Medicare Other

## 2012-11-18 ENCOUNTER — Other Ambulatory Visit: Payer: Self-pay | Admitting: *Deleted

## 2012-11-18 MED ORDER — SITAGLIPTIN PHOSPHATE 100 MG PO TABS: 100.0000 mg | ORAL_TABLET | Freq: Every day | ORAL | Status: DC

## 2012-12-19 ENCOUNTER — Other Ambulatory Visit: Payer: Self-pay

## 2012-12-20 ENCOUNTER — Other Ambulatory Visit: Payer: Self-pay | Admitting: *Deleted

## 2012-12-20 MED ORDER — SITAGLIPTIN PHOSPHATE 100 MG PO TABS: 100.0000 mg | ORAL_TABLET | Freq: Every day | ORAL | Status: DC

## 2012-12-20 NOTE — Telephone Encounter (Signed)
Rx did not go through. Resending  

## 2013-02-10 ENCOUNTER — Other Ambulatory Visit: Payer: Self-pay | Admitting: *Deleted

## 2013-02-10 MED ORDER — SITAGLIPTIN PHOSPHATE 100 MG PO TABS: 100.0000 mg | ORAL_TABLET | Freq: Every day | ORAL | Status: DC

## 2013-02-10 NOTE — Telephone Encounter (Signed)
Rx refill. Last refill.

## 2013-03-24 ENCOUNTER — Other Ambulatory Visit: Payer: Self-pay | Admitting: *Deleted

## 2013-03-24 MED ORDER — SITAGLIPTIN PHOSPHATE 100 MG PO TABS: 100.0000 mg | ORAL_TABLET | Freq: Every day | ORAL | Status: DC

## 2013-05-05 ENCOUNTER — Other Ambulatory Visit: Payer: Self-pay | Admitting: *Deleted

## 2013-05-05 MED ORDER — PIOGLITAZONE HCL 45 MG PO TABS: 45.0000 mg | ORAL_TABLET | Freq: Every day | ORAL | Status: DC

## 2013-05-05 MED ORDER — SITAGLIPTIN PHOSPHATE 100 MG PO TABS: 100.0000 mg | ORAL_TABLET | Freq: Every day | ORAL | Status: DC

## 2013-06-01 ENCOUNTER — Other Ambulatory Visit: Payer: Self-pay | Admitting: Endocrinology

## 2013-07-05 ENCOUNTER — Other Ambulatory Visit: Payer: Self-pay | Admitting: Endocrinology

## 2013-07-16 ENCOUNTER — Other Ambulatory Visit: Payer: Self-pay | Admitting: Endocrinology

## 2013-07-17 ENCOUNTER — Other Ambulatory Visit: Payer: Self-pay | Admitting: Endocrinology

## 2013-08-26 ENCOUNTER — Other Ambulatory Visit: Payer: Self-pay | Admitting: Endocrinology

## 2013-09-22 ENCOUNTER — Other Ambulatory Visit: Payer: Self-pay | Admitting: Endocrinology

## 2013-10-02 ENCOUNTER — Other Ambulatory Visit: Payer: Self-pay | Admitting: Endocrinology

## 2013-10-10 ENCOUNTER — Other Ambulatory Visit: Payer: Self-pay | Admitting: Endocrinology

## 2013-11-03 ENCOUNTER — Other Ambulatory Visit: Payer: Self-pay | Admitting: Endocrinology

## 2013-11-13 ENCOUNTER — Other Ambulatory Visit: Payer: Self-pay | Admitting: Endocrinology

## 2013-11-25 ENCOUNTER — Inpatient Hospital Stay (HOSPITAL_COMMUNITY)
Admission: EM | Admit: 2013-11-25 | Discharge: 2013-11-27 | DRG: 176 | Disposition: A | Discharge status: Home / Self Care | Payer: Medicare Other | Attending: Internal Medicine | Admitting: Internal Medicine

## 2013-11-25 ENCOUNTER — Telehealth: Payer: Self-pay | Admitting: Endocrinology

## 2013-11-25 ENCOUNTER — Emergency Department (HOSPITAL_COMMUNITY): Payer: Medicare Other

## 2013-11-25 ENCOUNTER — Encounter (HOSPITAL_COMMUNITY): Payer: Self-pay | Admitting: Emergency Medicine

## 2013-11-25 DIAGNOSIS — Z888 Allergy status to other drugs, medicaments and biological substances status: Secondary | ICD-10-CM

## 2013-11-25 DIAGNOSIS — J984 Other disorders of lung: Secondary | ICD-10-CM | POA: Diagnosis not present

## 2013-11-25 DIAGNOSIS — I10 Essential (primary) hypertension: Secondary | ICD-10-CM | POA: Diagnosis present

## 2013-11-25 DIAGNOSIS — E039 Hypothyroidism, unspecified: Secondary | ICD-10-CM | POA: Diagnosis present

## 2013-11-25 DIAGNOSIS — E7800 Pure hypercholesterolemia, unspecified: Secondary | ICD-10-CM | POA: Diagnosis present

## 2013-11-25 DIAGNOSIS — Z807 Family history of other malignant neoplasms of lymphoid, hematopoietic and related tissues: Secondary | ICD-10-CM | POA: Diagnosis not present

## 2013-11-25 DIAGNOSIS — Z91038 Other insect allergy status: Secondary | ICD-10-CM | POA: Diagnosis not present

## 2013-11-25 DIAGNOSIS — E119 Type 2 diabetes mellitus without complications: Secondary | ICD-10-CM

## 2013-11-25 DIAGNOSIS — Z79899 Other long term (current) drug therapy: Secondary | ICD-10-CM

## 2013-11-25 DIAGNOSIS — Z87891 Personal history of nicotine dependence: Secondary | ICD-10-CM | POA: Diagnosis not present

## 2013-11-25 DIAGNOSIS — Z836 Family history of other diseases of the respiratory system: Secondary | ICD-10-CM

## 2013-11-25 DIAGNOSIS — I2510 Atherosclerotic heart disease of native coronary artery without angina pectoris: Secondary | ICD-10-CM | POA: Diagnosis present

## 2013-11-25 DIAGNOSIS — R0989 Other specified symptoms and signs involving the circulatory and respiratory systems: Secondary | ICD-10-CM | POA: Diagnosis not present

## 2013-11-25 DIAGNOSIS — I2699 Other pulmonary embolism without acute cor pulmonale: Secondary | ICD-10-CM | POA: Diagnosis present

## 2013-11-25 DIAGNOSIS — R0602 Shortness of breath: Secondary | ICD-10-CM | POA: Diagnosis not present

## 2013-11-25 DIAGNOSIS — J9819 Other pulmonary collapse: Secondary | ICD-10-CM | POA: Diagnosis not present

## 2013-11-25 LAB — BASIC METABOLIC PANEL
Anion gap: 14 (ref 5–15)
BUN: 14 mg/dL (ref 6–23)
CO2: 26 mEq/L (ref 19–32)
Calcium: 9.6 mg/dL (ref 8.4–10.5)
Chloride: 96 mEq/L (ref 96–112)
Creatinine, Ser: 1.11 mg/dL (ref 0.50–1.35)
GFR calc Af Amer: 90 mL/min — ABNORMAL LOW (ref >90)
GFR calc non Af Amer: 77 mL/min — ABNORMAL LOW (ref >90)
Glucose, Bld: 106 mg/dL — ABNORMAL HIGH (ref 70–99)
Potassium: 4.2 mEq/L (ref 3.7–5.3)
Sodium: 136 mEq/L — ABNORMAL LOW (ref 137–147)

## 2013-11-25 LAB — I-STAT TROPONIN, ED: Troponin i, poc: 0 ng/mL (ref 0.00–0.08)

## 2013-11-25 LAB — CBC
HCT: 37.9 % — ABNORMAL LOW (ref 39.0–52.0)
Hemoglobin: 12.9 g/dL — ABNORMAL LOW (ref 13.0–17.0)
MCH: 31.5 pg (ref 26.0–34.0)
MCHC: 34 g/dL (ref 30.0–36.0)
MCV: 92.7 fL (ref 78.0–100.0)
Platelets: 325 10*3/uL (ref 150–400)
RBC: 4.09 MIL/uL — ABNORMAL LOW (ref 4.22–5.81)
RDW: 13.2 % (ref 11.5–15.5)
WBC: 7.6 10*3/uL (ref 4.0–10.5)

## 2013-11-25 LAB — PROTIME-INR
INR: 0.94 (ref 0.00–1.49)
Prothrombin Time: 12.6 seconds (ref 11.6–15.2)

## 2013-11-25 LAB — APTT: aPTT: 32 seconds (ref 24–37)

## 2013-11-25 MED ORDER — IOHEXOL 350 MG/ML SOLN
80.0000 mL | Freq: Once | INTRAVENOUS | Status: AC | PRN
  Administered 2013-11-25: 80 mL via INTRAVENOUS

## 2013-11-25 MED ORDER — VANCOMYCIN HCL IN DEXTROSE 1-5 GM/200ML-% IV SOLN
1000.0000 mg | Freq: Once | INTRAVENOUS | Status: AC
  Administered 2013-11-26: 1000 mg via INTRAVENOUS
  Filled 2013-11-25: qty 200

## 2013-11-25 NOTE — Telephone Encounter (Signed)
Ov today 2:30 PM

## 2013-11-25 NOTE — ED Notes (Signed)
Physician at bedside.

## 2013-11-25 NOTE — ED Notes (Signed)
Present with right lower leg redness began this AM suddenly, associated with nausea, SOB and palpitations at 11:30 this am. SOB is with minimal exertion. Pt denies Chest pain and palpitations at this time. Right leg with redness covering front of leg from ankle to knee and redness to back of knee.  Pt denies injury to leg. CMS intact.

## 2013-11-25 NOTE — Telephone Encounter (Signed)
Unable to reach pt. Will try again at a later time.  

## 2013-11-25 NOTE — ED Provider Notes (Signed)
CSN: [id number]     Arrival date & time 11/25/13  2012 History   First MD Initiated Contact with Patient 11/25/13 2059     Chief Complaint  Patient presents with  . Shortness of Breath  . Leg Pain     (Consider location/radiation/quality/duration/timing/severity/associated sxs/prior Treatment) The history is provided by the patient and medical records. No language interpreter was used.    Xavier [name] is a 48 y.o. male  with a hx of CAD, NIDDM, anemia, HTN, hypercholeserolemia presents to the Emergency Department complaining of gradual, persistent, progressively worsening redness to the right lower leg onset this morning. Associated symptoms include leg swelling, SOB with exertion (slowly worsening for the last month).  Pt also reports developing nausea, diaphoresis at 12:00 which resolved.  Pt denies chest pain, fever, chills, headache, neck pain, abd pain, vomiting diarrhea, weakness, dizziness, syncope.  No known aggravating or alleviating factors.  Pt denies falls, trauma or known injury.   He denies tick bites, spider bites, or increased risk of either.  Pt denies IVDU; hx of DVT or PE, recent travel, surgery or estrogen use.     Past Medical History  Diagnosis Date  . CORONARY ARTERY DISEASE 12/05/2006  . DIABETES MELLITUS, TYPE II 12/05/2006  . HYPERCHOLESTEROLEMIA 07/17/2007  . ANEMIA, PERNICIOUS 04/03/2007  . HYPERTENSION 12/05/2006  . VISUAL ACUITY, DECREASED, RIGHT EYE 02/19/2009  . Learning disability    Past Surgical History  Procedure Laterality Date  . Repair left hand     Family History  Problem Relation Age of Onset  . COPD Mother   . Lymphoma Father    History  Substance Use Topics  . Smoking status: Former Research scientist (life sciences)  . Smokeless tobacco: Not on file  . Alcohol Use: Not on file    Review of Systems  Constitutional: Positive for diaphoresis (resolved). Negative for fever, appetite change, fatigue and unexpected weight change.  HENT: Negative for mouth sores.     Eyes: Negative for visual disturbance.  Respiratory: Positive for chest tightness and shortness of breath. Negative for cough and wheezing.   Cardiovascular: Positive for leg swelling. Negative for chest pain.  Gastrointestinal: Positive for nausea (resolved). Negative for vomiting, abdominal pain, diarrhea and constipation.  Endocrine: Negative for polydipsia, polyphagia and polyuria.  Genitourinary: Negative for dysuria, urgency, frequency and hematuria.  Musculoskeletal: Negative for back pain and neck stiffness.  Skin: Positive for color change. Negative for rash.  Allergic/Immunologic: Negative for immunocompromised state.  Neurological: Negative for syncope, light-headedness and headaches.  Hematological: Does not bruise/bleed easily.  Psychiatric/Behavioral: Negative for sleep disturbance. The patient is not nervous/anxious.       Allergies  Bee venom and Niacin  Home Medications   Prior to Admission medications   Medication Sig Start Date End Date Taking? Authorizing Provider  aspirin 81 MG tablet Take 81 mg by mouth daily.    Yes Historical Provider, MD  cyanocobalamin 500 MCG tablet Take 1,000 mcg by mouth daily.    Yes Historical Provider, MD  diazepam (VALIUM) 5 MG tablet Take 5 mg by mouth 3 (three) times daily.    Yes Historical Provider, MD  DULoxetine (CYMBALTA) 30 MG capsule Take 30 mg by mouth daily.     Yes Historical Provider, MD  EPINEPHrine 0.3 mg/0.3 mL IJ SOAJ injection Inject 0.3 mg into the muscle once.   Yes Historical Provider, MD  FLUoxetine (PROZAC) 40 MG capsule Take 40 mg by mouth daily.     Yes Historical Provider, MD  losartan (  COZAAR) 50 MG tablet Take 50 mg by mouth daily.   Yes Historical Provider, MD  metFORMIN (GLUCOPHAGE-XR) 500 MG 24 hr tablet Take 1,000 mg by mouth 2 (two) times daily.   Yes Historical Provider, MD  pioglitazone (ACTOS) 45 MG tablet Take 45 mg by mouth daily.   Yes Historical Provider, MD  pyridoxine (B-6) 100 MG tablet Take  100 mg by mouth daily.     Yes Historical Provider, MD  simvastatin (ZOCOR) 40 MG tablet Take 40 mg by mouth daily.    Yes Historical Provider, MD  sitaGLIPtin (JANUVIA) 100 MG tablet Take 100 mg by mouth daily.   Yes Historical Provider, MD   BP 138/83  Pulse 69  Temp(Src) 98 F (36.7 C) (Oral)  Resp 18  Ht 6' (1.829 m)  Wt 277 lb (125.646 kg)  BMI 37.56 kg/m2  SpO2 99% Physical Exam  Nursing note and vitals reviewed. Constitutional: He appears well-developed and well-nourished. No distress.  Awake, alert, nontoxic appearance  HENT:  Head: Normocephalic and atraumatic.  Mouth/Throat: Oropharynx is clear and moist. No oropharyngeal exudate.  Eyes: Conjunctivae are normal. No scleral icterus.  Neck: Normal range of motion. Neck supple.  Cardiovascular: Normal rate, regular rhythm and intact distal pulses.   Pulmonary/Chest: Effort normal and breath sounds normal. No respiratory distress. He has no wheezes.  Abdominal: Soft. Bowel sounds are normal. He exhibits no mass. There is no tenderness. There is no rebound and no guarding.  Musculoskeletal: Normal range of motion. He exhibits no edema.       Legs: Pain to palpation of the right calf, no pain to palpation of the popliteal space No edema of the right lower leg  Neurological: He is alert.  Speech is clear and goal oriented Moves extremities without ataxia  Skin: Skin is warm and dry. He is not diaphoretic.  Psychiatric: He has a normal mood and affect.    ED Course  Procedures (including critical care time) Labs Review Labs Reviewed  CBC - Abnormal; Notable for the following:    RBC 4.09 (*)    Hemoglobin 12.9 (*)    HCT 37.9 (*)    All other components within normal limits  BASIC METABOLIC PANEL - Abnormal; Notable for the following:    Sodium 136 (*)    Glucose, Bld 106 (*)    GFR calc non Af Amer 77 (*)    GFR calc Af Amer 90 (*)    All other components within normal limits  CULTURE, BLOOD (ROUTINE X 2)    CULTURE, BLOOD (ROUTINE X 2)  PROTIME-INR  APTT  PRO B NATRIURETIC PEPTIDE  SEDIMENTATION RATE  C-REACTIVE PROTEIN  HEPARIN LEVEL (UNFRACTIONATED)  Randolm Idol, ED    Imaging Review Dg Chest 2 View  11/25/2013   CLINICAL DATA:  Shortness of Breath  EXAM: CHEST  2 VIEW  COMPARISON:  February 13, 2011  FINDINGS: There is slight scarring in the right middle lobe. Elsewhere lungs are clear. Heart size and pulmonary vascularity are normal. No adenopathy. No bone lesions.  IMPRESSION: No edema or consolidation.  Mild scarring right middle lobe.   Electronically Signed   By: Lowella Grip M.D.   On: 11/25/2013 23:23   Ct Angio Chest Pe W/cm &/or Wo Cm  11/25/2013   CLINICAL DATA:  Difficulty breathing and chest pain  EXAM: CT ANGIOGRAPHY CHEST WITH CONTRAST  TECHNIQUE: Multidetector CT imaging of the chest was performed using the standard protocol during bolus administration of intravenous contrast.  Multiplanar CT image reconstructions and MIPs were obtained to evaluate the vascular anatomy.  CONTRAST:  59m OMNIPAQUE IOHEXOL 350 MG/ML SOLN  COMPARISON:  Chest radiograph November 25, 2013 and chest CT Oct 12, 2005  FINDINGS: There are several small subsegmental right lower lobe pulmonary emboli. No larger pulmonary emboli are appreciable. There is no evidence suggesting right heart strain. There is no thoracic aortic aneurysm or dissection.  There is patchy atelectasis in the right middle lobe and in both lung bases posteriorly. Lungs elsewhere are clear. There is moderate mediastinal fat. There is no thoracic adenopathy. The pericardium is not appreciably thickened.  In the visualized upper abdomen, there is fatty change in the liver. There are no appreciable blastic or lytic bone lesions. Thyroid appears normal.  Review of the MIP images confirms the above findings.  IMPRESSION: Several subsegmental right lower lobe pulmonary emboli. No larger pulmonary emboli. No demonstrable right heart strain.  Areas of patchy atelectasis, mainly in the right middle lobe. Lungs elsewhere clear. Fatty liver present.  Critical Value/emergent results were called by telephone at the time of interpretation on 11/25/2013 at 11:43 pm to [facility] , who verbally acknowledged these results.   Electronically Signed   By: WLowella GripM.D.   On: 11/25/2013 23:44     EKG Interpretation   Date/Time:  Tuesday November 25 2013 20:20:08 EDT Ventricular Rate:  76 PR Interval:  176 QRS Duration: 72 QT Interval:  376 QTC Calculation: 423 R Axis:   120 Text Interpretation:  Normal sinus rhythm Right axis deviation Cannot rule  out Anterior infarct , age undetermined Abnormal ECG No significant change  was found Confirmed by CAMPOS  MD, KLennette Bihari([id number] on 11/25/2013 11:17:07 PM      MDM   Final diagnoses:  Pulmonary embolus   BESTIVEN KOHANpresents with 9 ecchymosis to the right anterior leg with associated pain to palpation of the right calf. No pitting edema or circumferential swelling of the right leg. Patient appears tachypneic on exam. He is without fever, tachycardia or hypotension. He denies chest pain or palpitations.  12:19 AM Labs reassuring and without leukocytosis. Mild anemia noted. PT/INR, APTT without clotting disorders and troponin negative. CT scan shows several subsegmental right lower lobe pulmonary emboli without evidence of right heart strain.  ECG shows a right axis deviation, no acute ischemia.  Heparin begun for PE as well as vancomycin to treat possible cellulitis. No evidence of gross abscess.  Capillary refill in the bilateral lower extremities remains at less than 3 seconds.  Blood cultures, ESR and CRP pending.   Possible vasculitis versus phlegmasia cerulea dolens versus cellulitis.  Also considered septic emboli from endocarditis, however pt is very well appearing.    BP 138/83  Pulse 69  Temp(Src) 98 F (36.7 C) (Oral)  Resp 18  Ht 6' (1.829 m)  Wt 277 lb (125.646  kg)  BMI 37.56 kg/m2  SpO2 99%  Pt admitted for further evaluation and treatment.    The patient was discussed with and seen by Dr. CVenora Mapleswho agrees with the treatment plan.   HJarrett SohoMuthersbaugh, PA-C 11/26/13 0022

## 2013-11-25 NOTE — Telephone Encounter (Signed)
Requested call back.  

## 2013-11-25 NOTE — Telephone Encounter (Signed)
See below and please advise, Thanks!  

## 2013-11-25 NOTE — Progress Notes (Signed)
ANTICOAGULATION CONSULT NOTE - Initial Consult  Pharmacy Consult for Heparin Indication: pulmonary embolus  Allergies  Allergen Reactions  . Bee Venom Anaphylaxis  . Niacin     "makes his crazy"    Patient Measurements: Height: 6' (182.9 cm) Weight: 277 lb (125.646 kg) IBW/kg (Calculated) : 77.6 Heparin Dosing Weight: 105 kg  Vital Signs: Temp: 98 F (36.7 C) (07/14 2028) Temp src: Oral (07/14 2028) BP: 134/81 mmHg (07/14 2317) Pulse Rate: 67 (07/14 2317)  Labs:  Recent Labs  11/25/13 2042 11/25/13 2149  HGB 12.9*  --   HCT 37.9*  --   PLT 325  --   APTT  --  32  LABPROT  --  12.6  INR  --  0.94  CREATININE 1.11  --     Estimated Creatinine Clearance: 112.6 ml/min (by C-G formula based on Cr of 1.11).   Medical History: Past Medical History  Diagnosis Date  . CORONARY ARTERY DISEASE 12/05/2006  . DIABETES MELLITUS, TYPE II 12/05/2006  . HYPERCHOLESTEROLEMIA 07/17/2007  . ANEMIA, PERNICIOUS 04/03/2007  . HYPERTENSION 12/05/2006  . VISUAL ACUITY, DECREASED, RIGHT EYE 02/19/2009  . Learning disability     Medications:  ASA  Vit B12  Valium  Cymbalta  Prozac  Cozaar  Metformin  Actos  Zocor  Januvia    Assessment: 48 yo male with DVT/PE for heparin Goal of Therapy:  Heparin level 0.3-0.7 units/ml Monitor platelets by anticoagulation protocol: Yes   Plan:  Heparin 5000 units IV bolus, then 1700 units/hr Check heparin level in 6 hours.   Eddie Candlebbott, Gregory Vernon 11/25/2013,11:57 PM

## 2013-11-25 NOTE — Telephone Encounter (Signed)
Patient wife called stating he has redness under the skin Reaction? Blood vessels? Resembles burn   Please advise patient   Thank you

## 2013-11-26 ENCOUNTER — Encounter (HOSPITAL_COMMUNITY): Payer: Self-pay | Admitting: *Deleted

## 2013-11-26 DIAGNOSIS — E119 Type 2 diabetes mellitus without complications: Secondary | ICD-10-CM | POA: Diagnosis not present

## 2013-11-26 DIAGNOSIS — Z87891 Personal history of nicotine dependence: Secondary | ICD-10-CM | POA: Diagnosis not present

## 2013-11-26 DIAGNOSIS — R0989 Other specified symptoms and signs involving the circulatory and respiratory systems: Secondary | ICD-10-CM | POA: Diagnosis not present

## 2013-11-26 DIAGNOSIS — Z79899 Other long term (current) drug therapy: Secondary | ICD-10-CM | POA: Diagnosis not present

## 2013-11-26 DIAGNOSIS — E039 Hypothyroidism, unspecified: Secondary | ICD-10-CM | POA: Diagnosis present

## 2013-11-26 DIAGNOSIS — R0602 Shortness of breath: Secondary | ICD-10-CM | POA: Diagnosis present

## 2013-11-26 DIAGNOSIS — I2510 Atherosclerotic heart disease of native coronary artery without angina pectoris: Secondary | ICD-10-CM | POA: Diagnosis present

## 2013-11-26 DIAGNOSIS — R0609 Other forms of dyspnea: Secondary | ICD-10-CM | POA: Diagnosis not present

## 2013-11-26 DIAGNOSIS — Z888 Allergy status to other drugs, medicaments and biological substances status: Secondary | ICD-10-CM | POA: Diagnosis not present

## 2013-11-26 DIAGNOSIS — I2699 Other pulmonary embolism without acute cor pulmonale: Secondary | ICD-10-CM | POA: Diagnosis present

## 2013-11-26 DIAGNOSIS — I10 Essential (primary) hypertension: Secondary | ICD-10-CM | POA: Diagnosis not present

## 2013-11-26 DIAGNOSIS — E7800 Pure hypercholesterolemia, unspecified: Secondary | ICD-10-CM | POA: Diagnosis present

## 2013-11-26 DIAGNOSIS — Z836 Family history of other diseases of the respiratory system: Secondary | ICD-10-CM | POA: Diagnosis not present

## 2013-11-26 DIAGNOSIS — Z807 Family history of other malignant neoplasms of lymphoid, hematopoietic and related tissues: Secondary | ICD-10-CM | POA: Diagnosis not present

## 2013-11-26 DIAGNOSIS — Z91038 Other insect allergy status: Secondary | ICD-10-CM | POA: Diagnosis not present

## 2013-11-26 LAB — GLUCOSE, CAPILLARY
Glucose-Capillary: 144 mg/dL — ABNORMAL HIGH (ref 70–99)
Glucose-Capillary: 109 mg/dL — ABNORMAL HIGH (ref 70–99)
Glucose-Capillary: 84 mg/dL (ref 70–99)
Glucose-Capillary: 114 mg/dL — ABNORMAL HIGH (ref 70–99)

## 2013-11-26 LAB — HEMOGLOBIN A1C
Hgb A1c MFr Bld: 6 % — ABNORMAL HIGH (ref <5.7)
Mean Plasma Glucose: 126 mg/dL — ABNORMAL HIGH (ref <117)

## 2013-11-26 LAB — COMPREHENSIVE METABOLIC PANEL
ALT: 22 U/L (ref 0–53)
AST: 27 U/L (ref 0–37)
Albumin: 3.7 g/dL (ref 3.5–5.2)
Alkaline Phosphatase: 57 U/L (ref 39–117)
Anion gap: 15 (ref 5–15)
BUN: 12 mg/dL (ref 6–23)
CO2: 23 mEq/L (ref 19–32)
Calcium: 9.1 mg/dL (ref 8.4–10.5)
Chloride: 100 mEq/L (ref 96–112)
Creatinine, Ser: 0.95 mg/dL (ref 0.50–1.35)
GFR calc Af Amer: >90 mL/min (ref >90)
GFR calc non Af Amer: >90 mL/min (ref >90)
Glucose, Bld: 116 mg/dL — ABNORMAL HIGH (ref 70–99)
Potassium: 3.7 mEq/L (ref 3.7–5.3)
Sodium: 138 mEq/L (ref 137–147)
Total Bilirubin: 0.7 mg/dL (ref 0.3–1.2)
Total Protein: 6.6 g/dL (ref 6.0–8.3)

## 2013-11-26 LAB — CBC WITH DIFFERENTIAL/PLATELET
Basophils Absolute: 0.1 10*3/uL (ref 0.0–0.1)
Basophils Relative: 1 % (ref 0–1)
Eosinophils Absolute: 0.2 10*3/uL (ref 0.0–0.7)
Eosinophils Relative: 3 % (ref 0–5)
HCT: 39.9 % (ref 39.0–52.0)
Hemoglobin: 13.4 g/dL (ref 13.0–17.0)
Lymphocytes Relative: 38 % (ref 12–46)
Lymphs Abs: 2.5 10*3/uL (ref 0.7–4.0)
MCH: 31.1 pg (ref 26.0–34.0)
MCHC: 33.6 g/dL (ref 30.0–36.0)
MCV: 92.6 fL (ref 78.0–100.0)
Monocytes Absolute: 0.6 10*3/uL (ref 0.1–1.0)
Monocytes Relative: 9 % (ref 3–12)
Neutro Abs: 3.4 10*3/uL (ref 1.7–7.7)
Neutrophils Relative %: 49 % (ref 43–77)
Platelets: 283 10*3/uL (ref 150–400)
RBC: 4.31 MIL/uL (ref 4.22–5.81)
RDW: 13.1 % (ref 11.5–15.5)
WBC: 6.7 10*3/uL (ref 4.0–10.5)

## 2013-11-26 LAB — TSH: TSH: 3.57 u[IU]/mL (ref 0.350–4.500)

## 2013-11-26 LAB — RAPID STREP SCREEN (MED CTR MEBANE ONLY): Streptococcus, Group A Screen (Direct): NEGATIVE

## 2013-11-26 LAB — C-REACTIVE PROTEIN: CRP: 0.6 mg/dL — ABNORMAL HIGH (ref <0.60)

## 2013-11-26 LAB — PRO B NATRIURETIC PEPTIDE: Pro B Natriuretic peptide (BNP): 49.4 pg/mL (ref 0–125)

## 2013-11-26 LAB — SEDIMENTATION RATE: Sed Rate: 6 mm/hr (ref 0–16)

## 2013-11-26 MED ORDER — INSULIN ASPART 100 UNIT/ML ~~LOC~~ SOLN
0.0000 [IU] | Freq: Three times a day (TID) | SUBCUTANEOUS | Status: DC
  Administered 2013-11-27: 1 [IU] via SUBCUTANEOUS

## 2013-11-26 MED ORDER — DULOXETINE HCL 30 MG PO CPEP
30.0000 mg | ORAL_CAPSULE | Freq: Every day | ORAL | Status: DC
  Administered 2013-11-26 – 2013-11-27 (×2): 30 mg via ORAL
  Filled 2013-11-26 (×2): qty 1

## 2013-11-26 MED ORDER — SIMVASTATIN 40 MG PO TABS
40.0000 mg | ORAL_TABLET | Freq: Every day | ORAL | Status: DC
  Administered 2013-11-26 – 2013-11-27 (×2): 40 mg via ORAL
  Filled 2013-11-26 (×2): qty 1

## 2013-11-26 MED ORDER — RIVAROXABAN 15 MG PO TABS
15.0000 mg | ORAL_TABLET | Freq: Two times a day (BID) | ORAL | Status: DC
  Administered 2013-11-26 – 2013-11-27 (×3): 15 mg via ORAL
  Filled 2013-11-26 (×5): qty 1

## 2013-11-26 MED ORDER — HEPARIN (PORCINE) IN NACL 100-0.45 UNIT/ML-% IJ SOLN
1700.0000 [IU]/h | INTRAMUSCULAR | Status: DC
  Administered 2013-11-26: 1700 [IU]/h via INTRAVENOUS
  Filled 2013-11-26 (×2): qty 250

## 2013-11-26 MED ORDER — DIAZEPAM 5 MG PO TABS
5.0000 mg | ORAL_TABLET | Freq: Three times a day (TID) | ORAL | Status: DC
  Administered 2013-11-26 – 2013-11-27 (×3): 5 mg via ORAL
  Filled 2013-11-26 (×3): qty 1

## 2013-11-26 MED ORDER — SODIUM CHLORIDE 0.9 % IJ SOLN
3.0000 mL | Freq: Two times a day (BID) | INTRAMUSCULAR | Status: DC
  Administered 2013-11-26 (×2): 3 mL via INTRAVENOUS

## 2013-11-26 MED ORDER — LOSARTAN POTASSIUM 50 MG PO TABS
50.0000 mg | ORAL_TABLET | Freq: Every day | ORAL | Status: DC
  Administered 2013-11-26 – 2013-11-27 (×2): 50 mg via ORAL
  Filled 2013-11-26 (×2): qty 1

## 2013-11-26 MED ORDER — INSULIN ASPART 100 UNIT/ML ~~LOC~~ SOLN
0.0000 [IU] | SUBCUTANEOUS | Status: DC
  Administered 2013-11-26: 1 [IU] via SUBCUTANEOUS

## 2013-11-26 MED ORDER — ASPIRIN 81 MG PO CHEW
81.0000 mg | CHEWABLE_TABLET | Freq: Every day | ORAL | Status: DC
  Filled 2013-11-26: qty 1

## 2013-11-26 MED ORDER — HEPARIN BOLUS VIA INFUSION
5000.0000 [IU] | Freq: Once | INTRAVENOUS | Status: AC
  Administered 2013-11-26: 5000 [IU] via INTRAVENOUS
  Filled 2013-11-26: qty 5000

## 2013-11-26 MED ORDER — FLUOXETINE HCL 20 MG PO CAPS
40.0000 mg | ORAL_CAPSULE | Freq: Every day | ORAL | Status: DC
  Administered 2013-11-26 – 2013-11-27 (×2): 40 mg via ORAL
  Filled 2013-11-26 (×2): qty 2

## 2013-11-26 MED ORDER — RIVAROXABAN 20 MG PO TABS: 20.0000 mg | ORAL_TABLET | Freq: Every day | ORAL | Status: DC

## 2013-11-26 NOTE — ED Provider Notes (Signed)
Medical screening examination/treatment/procedure(s) were conducted as a shared visit with non-physician practitioner(s) and myself.  I personally evaluated the patient during the encounter.   EKG Interpretation   Date/Time:  Tuesday November 25 2013 20:20:08 EDT Ventricular Rate:  76 PR Interval:  176 QRS Duration: 72 QT Interval:  376 QTC Calculation: 423 R Axis:   120 Text Interpretation:  Normal sinus rhythm Right axis deviation Cannot rule  out Anterior infarct , age undetermined Abnormal ECG No significant change  was found Confirmed by CAMPOS  MD, Caryn BeeKEVIN ([id number]) on 11/25/2013 11:17:07 PM      CRITICAL CARE Performed by: Lyanne CoAMPOS,KEVIN M Total critical care time: 35 Critical care time was exclusive of separately billable procedures and treating other patients. Critical care was necessary to treat or prevent imminent or life-threatening deterioration. Critical care was time spent personally by me on the following activities: development of treatment plan with patient and/or surrogate as well as nursing, discussions with consultants, evaluation of patient's response to treatment, examination of patient, obtaining history from patient or surrogate, ordering and performing treatments and interventions, ordering and review of laboratory studies, ordering and review of radiographic studies, pulse oximetry and re-evaluation of patient's condition.  Patient with worsening right leg pain and swelling with some discoloration.  CT scanning demonstrates new pulmonary emboli.  Patient will be started on a heparin drip.  Patient has normal pulses in his bilateral PT and DP arteries. Basic bedside ultrasound of his right lower extremity shows no obvious femoral vein occlusion with a compressible femoral vein noted proximally.  Difficult exam secondary to body habitus though.  Other considerations would include septic emboli   Dg Chest 2 View  11/25/2013   CLINICAL DATA:  Shortness of Breath  EXAM: CHEST   2 VIEW  COMPARISON:  February 13, 2011  FINDINGS: There is slight scarring in the right middle lobe. Elsewhere lungs are clear. Heart size and pulmonary vascularity are normal. No adenopathy. No bone lesions.  IMPRESSION: No edema or consolidation.  Mild scarring right middle lobe.   Electronically Signed   By: Bretta BangWilliam  Woodruff M.D.   On: 11/25/2013 23:23   Ct Angio Chest Pe W/cm &/or Wo Cm  11/25/2013   CLINICAL DATA:  Difficulty breathing and chest pain  EXAM: CT ANGIOGRAPHY CHEST WITH CONTRAST  TECHNIQUE: Multidetector CT imaging of the chest was performed using the standard protocol during bolus administration of intravenous contrast. Multiplanar CT image reconstructions and MIPs were obtained to evaluate the vascular anatomy.  CONTRAST:  80mL OMNIPAQUE IOHEXOL 350 MG/ML SOLN  COMPARISON:  Chest radiograph November 25, 2013 and chest CT Oct 12, 2005  FINDINGS: There are several small subsegmental right lower lobe pulmonary emboli. No larger pulmonary emboli are appreciable. There is no evidence suggesting right heart strain. There is no thoracic aortic aneurysm or dissection.  There is patchy atelectasis in the right middle lobe and in both lung bases posteriorly. Lungs elsewhere are clear. There is moderate mediastinal fat. There is no thoracic adenopathy. The pericardium is not appreciably thickened.  In the visualized upper abdomen, there is fatty change in the liver. There are no appreciable blastic or lytic bone lesions. Thyroid appears normal.  Review of the MIP images confirms the above findings.  IMPRESSION: Several subsegmental right lower lobe pulmonary emboli. No larger pulmonary emboli. No demonstrable right heart strain. Areas of patchy atelectasis, mainly in the right middle lobe. Lungs elsewhere clear. Fatty liver present.  Critical Value/emergent results were called by telephone at the time  of interpretation on 11/25/2013 at 11:43 pm to [facility], PA , who verbally acknowledged these  results.   Electronically Signed   By: Bretta Bang M.D.   On: 11/25/2013 23:44  I personally reviewed the imaging tests through PACS system I reviewed available ER/hospitalization records through the EMR   Lyanne Co, MD 11/26/13 [phone]

## 2013-11-26 NOTE — Progress Notes (Signed)
*  PRELIMINARY RESULTS* Vascular Ultrasound Bilateral lower extremity venous duplex has been completed.  Preliminary findings: Bilateral:  No evidence of DVT, superficial thrombosis, or Baker's Cyst.    [name], Xavier FRANCES 11/26/2013, 11:33 AM

## 2013-11-26 NOTE — Care Management Note (Signed)
    Page 1 of 2   11/27/2013     2:42:51 PM CARE MANAGEMENT NOTE 11/27/2013  Patient:  Xavier [name],Xavier [name]   Account Number:  [id number]  Date Initiated:  11/26/2013  Documentation initiated by:  AMERSON,JULIE  Subjective/Objective Assessment:   Pt adm on 11/25/13 with acute PE, DVT.  PTA, pt independent, lives with spouse.     Action/Plan:   CM consult for Xarelto; will check coverage for med, give free 30 day trial card.   Anticipated DC Date:  11/27/2013   Anticipated DC Plan:  HOME/SELF CARE      DC Planning Services  CM consult      Choice offered to / List presented to:             Status of service:  Completed, signed off Medicare Important Message given?  NA - LOS <3 / Initial given by admissions (If response is "NO", the following Medicare IM given date fields will be blank) Date Medicare IM given:   Medicare IM given by:   Date Additional Medicare IM given:   Additional Medicare IM given by:    Discharge Disposition:  HOME/SELF CARE  Per UR Regulation:  Reviewed for med. necessity/level of care/duration of stay  If discussed at Long Length of Stay Meetings, dates discussed:    Comments:  11/26/13 Sidney AceJulie Amerson, RN, BSN [phone]  S/W KEBRA @ [employer] # [id number]   XARELTO 15 MG BID X 21 DAYS CO-PAY-$ 6.60 PRIOR  APPROVAL -NO TIER- 3  DRUG PHARMACY : STANDARD  XARELTO  20 MG  DAILY 30 DAY SUPPLY CO-PAY-$ 6.60 PRIOR APPROVAL - NO TIER-3 DRUG PHARMACY - STANDARD   Pt given 30 day free trial card for Xarelto.

## 2013-11-26 NOTE — H&P (Addendum)
Hospitalist Admission History and Physical  Patient name: Xavier [name] Medical record number: [id number] Date of birth: 20-Sep-1965 Age: 48 y.o. Gender: male  Primary Care Provider: [name], MD  Chief Complaint: PE, LE lesions.  History of Present Illness:This is a 48 y.o. year old male with significant past medical history of HTN, DM, hypothyroidism  presenting with PE, LE lesions. Pt states that he noticed progressive erythema and pain in LEs over the course of the past 24 hours. Has also had some generalized malaise and chills. Denies any CP or SOB. No recent change in medications. Diabetes has been stable. Reports that he took and extended trip to [place] 3-4 weeks ago.  Presented to ER hemodynamically stable. Afebrile. Satting >98% on RA. WBC 7.6, hgb 12.9, Cr 1.11, Glu 106. CT Angio shows several subsegmental RLL pulm emboli without heart strain with areas of patchy atelectasis mainly in RML. Bedside U/S performed on LEs that was preliminarily negative for DVT. Pt started prophylactically on vanc for cellulitis coverage prior to CT Angio.   Assessment and Plan: Xavier [name] is a 48 y.o. year old male presenting with PE, LE lesions   PE: Continue heparin gtt. Prolonged car trip w/in last 3-4 weeks likely inciting event. No active CP. No hypoxia. Telemetry bed.   LE lesions: Broad ddx for sxs. Infectious vs. Autoimmune vs. Vasculitic etiology among others? 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 LE venous duplex to assess for clot burden. F/u on ESR and CRP. Add on ASO titer/rapid strep ( r/o erythema nodosum). 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 thyroid disease per pt report, albeit lower. Check TSH. Overall distribution not clinically consistent with cellulitis. No leukocytosis, fever. Areas marked. Vancomycin x1 in ER. Follow overnight. Plts WNL.   CAD/HTN: no active CP. Hemodynamically stable. Continue home regimen   DM: SSI. A1C  FEN/GI: heart healthy, carb modified diet   Prophylaxis: heparin gtt  Disposition: pending further evaluation  Code Status:Full Code    Patient Active Problem List   Diagnosis Date Noted  . Pulmonary embolism 11/26/2013  . PE (pulmonary embolism) 11/26/2013  . Screening for prostate cancer 09/24/2012  . Encounter for long-term (current) use of other medications 09/24/2012  . Abnormal breath sounds 02/13/2011  . HEADACHE 06/10/2010  . CHEST PAIN UNSPECIFIED 06/10/2010  . VISUAL ACUITY, DECREASED, RIGHT EYE 02/19/2009  . HYPERCHOLESTEROLEMIA 07/17/2007  . ANEMIA, PERNICIOUS 04/03/2007  . DIABETES MELLITUS, TYPE II 12/05/2006  . HYPERTENSION 12/05/2006  . CORONARY ARTERY DISEASE 12/05/2006   Past Medical History: Past Medical History  Diagnosis Date  . CORONARY ARTERY DISEASE 12/05/2006  . DIABETES MELLITUS, TYPE II 12/05/2006  . HYPERCHOLESTEROLEMIA 07/17/2007  . ANEMIA, PERNICIOUS 04/03/2007  . HYPERTENSION 12/05/2006  . VISUAL ACUITY, DECREASED, RIGHT EYE 02/19/2009  . Learning disability     Past Surgical History: Past Surgical History  Procedure Laterality Date  . Repair left hand      Social History: History   Social History  . Marital Status: Married    Spouse Name: N/A    Number of Children: N/A  . Years of Education: N/A   Occupational History  . Disabled    Social History Main Topics  . Smoking status: Former Research scientist (life sciences)  . Smokeless tobacco: None  . Alcohol Use: None  . Drug Use: None  . Sexual Activity: None   Other Topics Concern  . None   Social History Narrative   Disabled due to left wrist injury at work  Family History: Family History  Problem Relation Age of Onset  . COPD Mother   . Lymphoma Father     Allergies: Allergies  Allergen Reactions  . Bee Venom Anaphylaxis  . Niacin     "makes his crazy"    Current Facility-Administered Medications  Medication Dose Route Frequency Provider Last Rate Last Dose  . aspirin tablet 81 mg  81 mg Oral Daily Shanda Howells, MD      .  cyanocobalamin ((VITAMIN B-12)) injection 1,000 mcg  1,000 mcg Intramuscular Once Xavier Shin, MD      . diazepam (VALIUM) tablet 5 mg  5 mg Oral TID Shanda Howells, MD      . DULoxetine (CYMBALTA) DR capsule 30 mg  30 mg Oral Daily Shanda Howells, MD      . FLUoxetine (PROZAC) capsule 40 mg  40 mg Oral Daily Shanda Howells, MD      . heparin ADULT infusion 100 units/mL (25000 units/250 mL)  1,700 Units/hr Intravenous Continuous Hoy Morn, MD      . heparin bolus via infusion 5,000 Units  5,000 Units Intravenous Once Hoy Morn, MD      . insulin aspart (novoLOG) injection 0-9 Units  0-9 Units Subcutaneous 6 times per day Shanda Howells, MD      . losartan (COZAAR) tablet 50 mg  50 mg Oral Daily Shanda Howells, MD      . simvastatin (ZOCOR) tablet 40 mg  40 mg Oral Daily Shanda Howells, MD      . sodium chloride 0.9 % injection 3 mL  3 mL Intravenous Q12H Shanda Howells, MD      . vancomycin (VANCOCIN) IVPB 1000 mg/200 mL premix  1,000 mg Intravenous Once Hannah Muthersbaugh, PA-C 200 mL/hr at 11/26/13 0021 1,000 mg at 11/26/13 0021   Current Outpatient Prescriptions  Medication Sig Dispense Refill  . aspirin 81 MG tablet Take 81 mg by mouth daily.       . cyanocobalamin 500 MCG tablet Take 1,000 mcg by mouth daily.       . diazepam (VALIUM) 5 MG tablet Take 5 mg by mouth 3 (three) times daily.       . DULoxetine (CYMBALTA) 30 MG capsule Take 30 mg by mouth daily.        Marland Kitchen EPINEPHrine 0.3 mg/0.3 mL IJ SOAJ injection Inject 0.3 mg into the muscle once.      Marland Kitchen FLUoxetine (PROZAC) 40 MG capsule Take 40 mg by mouth daily.        Marland Kitchen losartan (COZAAR) 50 MG tablet Take 50 mg by mouth daily.      . metFORMIN (GLUCOPHAGE-XR) 500 MG 24 hr tablet Take 1,000 mg by mouth 2 (two) times daily.      . pioglitazone (ACTOS) 45 MG tablet Take 45 mg by mouth daily.      Marland Kitchen pyridoxine (B-6) 100 MG tablet Take 100 mg by mouth daily.        . simvastatin (ZOCOR) 40 MG tablet Take 40 mg by mouth daily.       .  sitaGLIPtin (JANUVIA) 100 MG tablet Take 100 mg by mouth daily.       Review Of Systems: 12 point ROS negative except as noted above in HPI.  Physical Exam: Filed Vitals:   11/26/13 0021  BP: 138/83  Pulse:   Temp:   Resp: 18    General: alert and cooperative HEENT: PERRLA and extra ocular movement intact Heart: S1, S2 normal, no murmur, rub  or gallop, regular rate and rhythm Lungs: clear to auscultation, no wheezes or rales and unlabored breathing Abdomen: abdomen is soft without significant tenderness, masses, organomegaly or guarding Extremities: + bilateral pretibial erythematous, ecchymotic lesions, mild TTP, + post knee TTP bilaterally , neurovascularly intact distally  Skin:as above  Neurology: normal without focal findings  Labs and Imaging: Lab Results  Component Value Date/Time   NA 136* 11/25/2013  8:42 PM   K 4.2 11/25/2013  8:42 PM   CL 96 11/25/2013  8:42 PM   CO2 26 11/25/2013  8:42 PM   BUN 14 11/25/2013  8:42 PM   CREATININE 1.11 11/25/2013  8:42 PM   GLUCOSE 106* 11/25/2013  8:42 PM   Lab Results  Component Value Date   WBC 7.6 11/25/2013   HGB 12.9* 11/25/2013   HCT 37.9* 11/25/2013   MCV 92.7 11/25/2013   PLT 325 11/25/2013    Dg Chest 2 View  11/25/2013   CLINICAL DATA:  Shortness of Breath  EXAM: CHEST  2 VIEW  COMPARISON:  February 13, 2011  FINDINGS: There is slight scarring in the right middle lobe. Elsewhere lungs are clear. Heart size and pulmonary vascularity are normal. No adenopathy. No bone lesions.  IMPRESSION: No edema or consolidation.  Mild scarring right middle lobe.   Electronically Signed   By: Lowella Grip M.D.   On: 11/25/2013 23:23   Ct Angio Chest Pe W/cm &/or Wo Cm  11/25/2013   CLINICAL DATA:  Difficulty breathing and chest pain  EXAM: CT ANGIOGRAPHY CHEST WITH CONTRAST  TECHNIQUE: Multidetector CT imaging of the chest was performed using the standard protocol during bolus administration of intravenous contrast. Multiplanar CT image  reconstructions and MIPs were obtained to evaluate the vascular anatomy.  CONTRAST:  20m OMNIPAQUE IOHEXOL 350 MG/ML SOLN  COMPARISON:  Chest radiograph November 25, 2013 and chest CT Oct 12, 2005  FINDINGS: There are several small subsegmental right lower lobe pulmonary emboli. No larger pulmonary emboli are appreciable. There is no evidence suggesting right heart strain. There is no thoracic aortic aneurysm or dissection.  There is patchy atelectasis in the right middle lobe and in both lung bases posteriorly. Lungs elsewhere are clear. There is moderate mediastinal fat. There is no thoracic adenopathy. The pericardium is not appreciably thickened.  In the visualized upper abdomen, there is fatty change in the liver. There are no appreciable blastic or lytic bone lesions. Thyroid appears normal.  Review of the MIP images confirms the above findings.  IMPRESSION: Several subsegmental right lower lobe pulmonary emboli. No larger pulmonary emboli. No demonstrable right heart strain. Areas of patchy atelectasis, mainly in the right middle lobe. Lungs elsewhere clear. Fatty liver present.  Critical Value/emergent results were called by telephone at the time of interpretation on 11/25/2013 at 11:43 pm to [facility] , who verbally acknowledged these results.   Electronically Signed   By: WLowella GripM.D.   On: 11/25/2013 23:44           SShanda HowellsMD  Pager: [phone]

## 2013-11-26 NOTE — Progress Notes (Signed)
  Echocardiogram 2D Echocardiogram has been performed.  [name], Xavier FRANCES 11/26/2013, 11:32 AM

## 2013-11-26 NOTE — Progress Notes (Signed)
Pt seen and examined, admitted earlier this am with Acute PE and blotchy areas in lower extremities. For Acute PE, transition to Xarelto today, discussed with pt and spouse -R/o DVT, changes in lower extremities likely from acute DVT or recent, await Duplex US. -clinically no cellutlitis  Zannie CovePreetha Joseph, MD [phone]

## 2013-11-26 NOTE — Telephone Encounter (Signed)
Got in touch with Pt's wife. Pt was admitted to the Hospital yesterday for Pulmonary Emboli. Pt has blood clots in his legs and lungs. Pt wanted to you be advised and would like for you to look into what the Hospital is doing regarding his care. Pt still has no reason as to what has caused this.

## 2013-11-26 NOTE — Telephone Encounter (Signed)
please call patient: It loooks like they are doing a good job for you in the hospital.  Please come in for an appointment after you get out.  i hope you feel better soon.

## 2013-11-26 NOTE — Progress Notes (Signed)
UR Completed.  Xavier [name], Xavier [name] [phone] 11/26/2013  

## 2013-11-26 NOTE — Telephone Encounter (Signed)
Requested call back.  

## 2013-11-27 DIAGNOSIS — I10 Essential (primary) hypertension: Secondary | ICD-10-CM

## 2013-11-27 DIAGNOSIS — E119 Type 2 diabetes mellitus without complications: Secondary | ICD-10-CM

## 2013-11-27 LAB — CBC WITH DIFFERENTIAL/PLATELET
Basophils Absolute: 0.1 10*3/uL (ref 0.0–0.1)
Basophils Relative: 1 % (ref 0–1)
Eosinophils Absolute: 0.2 10*3/uL (ref 0.0–0.7)
Eosinophils Relative: 3 % (ref 0–5)
HCT: 40.6 % (ref 39.0–52.0)
Hemoglobin: 13.6 g/dL (ref 13.0–17.0)
Lymphocytes Relative: 28 % (ref 12–46)
Lymphs Abs: 2.1 10*3/uL (ref 0.7–4.0)
MCH: 31.8 pg (ref 26.0–34.0)
MCHC: 33.5 g/dL (ref 30.0–36.0)
MCV: 94.9 fL (ref 78.0–100.0)
Monocytes Absolute: 0.7 10*3/uL (ref 0.1–1.0)
Monocytes Relative: 9 % (ref 3–12)
Neutro Abs: 4.4 10*3/uL (ref 1.7–7.7)
Neutrophils Relative %: 59 % (ref 43–77)
Platelets: 288 10*3/uL (ref 150–400)
RBC: 4.28 MIL/uL (ref 4.22–5.81)
RDW: 13.1 % (ref 11.5–15.5)
WBC: 7.4 10*3/uL (ref 4.0–10.5)

## 2013-11-27 LAB — COMPREHENSIVE METABOLIC PANEL
ALT: 22 U/L (ref 0–53)
AST: 16 U/L (ref 0–37)
Albumin: 3.6 g/dL (ref 3.5–5.2)
Alkaline Phosphatase: 57 U/L (ref 39–117)
Anion gap: 12 (ref 5–15)
BUN: 12 mg/dL (ref 6–23)
CO2: 28 mEq/L (ref 19–32)
Calcium: 9 mg/dL (ref 8.4–10.5)
Chloride: 100 mEq/L (ref 96–112)
Creatinine, Ser: 1.08 mg/dL (ref 0.50–1.35)
GFR calc Af Amer: >90 mL/min (ref >90)
GFR calc non Af Amer: 80 mL/min — ABNORMAL LOW (ref >90)
Glucose, Bld: 121 mg/dL — ABNORMAL HIGH (ref 70–99)
Potassium: 4.2 mEq/L (ref 3.7–5.3)
Sodium: 140 mEq/L (ref 137–147)
Total Bilirubin: 0.7 mg/dL (ref 0.3–1.2)
Total Protein: 6.8 g/dL (ref 6.0–8.3)

## 2013-11-27 LAB — GLUCOSE, CAPILLARY: Glucose-Capillary: 141 mg/dL — ABNORMAL HIGH (ref 70–99)

## 2013-11-27 MED ORDER — METFORMIN HCL ER 500 MG PO TB24: 1000.0000 mg | ORAL_TABLET | Freq: Two times a day (BID) | ORAL | Status: DC

## 2013-11-27 MED ORDER — RIVAROXABAN 15 MG PO TABS: 15.0000 mg | ORAL_TABLET | Freq: Two times a day (BID) | ORAL | Status: DC

## 2013-11-27 MED ORDER — RIVAROXABAN 20 MG PO TABS: 20.0000 mg | ORAL_TABLET | Freq: Every day | ORAL | Status: DC

## 2013-11-27 NOTE — Progress Notes (Signed)
IV and tele monitor d/c at this time; pt and wife given d/c instructions; pt given prescriptions and follow up appointment information; both wife and pt verbalized understanding; will cont. To monitor.

## 2013-11-27 NOTE — Telephone Encounter (Signed)
Pt's wife advised. Pt to be discharged today. And will schedule after that.

## 2013-11-28 LAB — CULTURE, GROUP A STREP

## 2013-11-29 LAB — ANTISTREPTOLYSIN O TITER: ASO: 45 IU/mL (ref <409)

## 2013-11-30 ENCOUNTER — Other Ambulatory Visit: Payer: Self-pay | Admitting: Endocrinology

## 2013-12-02 LAB — CULTURE, BLOOD (ROUTINE X 2)
Culture: NO GROWTH
Culture: NO GROWTH

## 2013-12-03 ENCOUNTER — Encounter: Payer: Self-pay | Admitting: Endocrinology

## 2013-12-03 ENCOUNTER — Ambulatory Visit (INDEPENDENT_AMBULATORY_CARE_PROVIDER_SITE_OTHER): Payer: Medicare Other | Admitting: Endocrinology

## 2013-12-03 VITALS — BP 128/94 | HR 86 | Temp 98.7°F | Ht 72.0 in | Wt 273.0 lb

## 2013-12-03 DIAGNOSIS — F32A Depression, unspecified: Secondary | ICD-10-CM | POA: Insufficient documentation

## 2013-12-03 DIAGNOSIS — S48919A Complete traumatic amputation of unspecified shoulder and upper arm, level unspecified, initial encounter: Secondary | ICD-10-CM | POA: Insufficient documentation

## 2013-12-03 DIAGNOSIS — S48912D Complete traumatic amputation of left shoulder and upper arm, level unspecified, subsequent encounter: Secondary | ICD-10-CM

## 2013-12-03 DIAGNOSIS — Z4789 Encounter for other orthopedic aftercare: Secondary | ICD-10-CM

## 2013-12-03 DIAGNOSIS — E119 Type 2 diabetes mellitus without complications: Secondary | ICD-10-CM

## 2013-12-03 DIAGNOSIS — F3289 Other specified depressive episodes: Secondary | ICD-10-CM

## 2013-12-03 DIAGNOSIS — F329 Major depressive disorder, single episode, unspecified: Secondary | ICD-10-CM

## 2013-12-03 DIAGNOSIS — I2699 Other pulmonary embolism without acute cor pulmonale: Secondary | ICD-10-CM

## 2013-12-03 DIAGNOSIS — D510 Vitamin B12 deficiency anemia due to intrinsic factor deficiency: Secondary | ICD-10-CM | POA: Diagnosis not present

## 2013-12-03 LAB — MICROALBUMIN / CREATININE URINE RATIO
Creatinine,U: 252.3 mg/dL
Microalb Creat Ratio: 1.5 mg/g (ref 0.0–30.0)
Microalb, Ur: 3.9 mg/dL — ABNORMAL HIGH (ref 0.0–1.9)

## 2013-12-03 NOTE — Progress Notes (Signed)
Subjective:    Patient ID: Xavier [name], male    DOB: 1966-01-29, 48 y.o.   MRN: [id number]  HPI Pt returns for f/u of type 2 dm (dx'ed 2005; he has mild if any neuropathy of the lower extremities, but he has associated CAD).  no cbg record, but states cbg's are well-controlled He was recently in the hospital for pulm emboli.  He feels better now.   Past Medical History  Diagnosis Date  . CORONARY ARTERY DISEASE 12/05/2006  . DIABETES MELLITUS, TYPE II 12/05/2006  . HYPERCHOLESTEROLEMIA 07/17/2007  . ANEMIA, PERNICIOUS 04/03/2007  . HYPERTENSION 12/05/2006  . VISUAL ACUITY, DECREASED, RIGHT EYE 02/19/2009  . Learning disability     Past Surgical History  Procedure Laterality Date  . Repair left hand      History   Social History  . Marital Status: Married    Spouse Name: N/A    Number of Children: N/A  . Years of Education: N/A   Occupational History  . Disabled    Social History Main Topics  . Smoking status: Former Games developermoker  . Smokeless tobacco: Not on file  . Alcohol Use: Not on file  . Drug Use: Not on file  . Sexual Activity: Not on file   Other Topics Concern  . Not on file   Social History Narrative   Disabled due to left wrist injury at work    Current Outpatient Prescriptions on File Prior to Visit  Medication Sig Dispense Refill  . cyanocobalamin 500 MCG tablet Take 1,000 mcg by mouth daily.       . diazepam (VALIUM) 5 MG tablet Take 5 mg by mouth 3 (three) times daily.       . DULoxetine (CYMBALTA) 30 MG capsule Take 30 mg by mouth daily.        Marland Kitchen. EPINEPHrine 0.3 mg/0.3 mL IJ SOAJ injection Inject 0.3 mg into the muscle once.      Marland Kitchen. FLUoxetine (PROZAC) 40 MG capsule Take 40 mg by mouth daily.        Marland Kitchen. JANUVIA 100 MG tablet TAKE 1 TABLET BY MOUTH EVERY DAY  30 tablet  0  . losartan (COZAAR) 50 MG tablet Take 50 mg by mouth daily.      . metFORMIN (GLUCOPHAGE-XR) 500 MG 24 hr tablet Take 2 tablets (1,000 mg total) by mouth 2 (two) times daily.      .  pioglitazone (ACTOS) 45 MG tablet Take 45 mg by mouth daily.      Marland Kitchen. pyridoxine (B-6) 100 MG tablet Take 100 mg by mouth daily.        . Rivaroxaban (XARELTO) 15 MG TABS tablet Take 1 tablet (15 mg total) by mouth 2 (two) times daily with a meal.  40 tablet  0  . [START ON 12/18/2013] rivaroxaban (XARELTO) 20 MG TABS tablet Take 1 tablet (20 mg total) by mouth daily before supper.  30 tablet  0  . simvastatin (ZOCOR) 40 MG tablet Take 40 mg by mouth daily.       . sitaGLIPtin (JANUVIA) 100 MG tablet Take 100 mg by mouth daily.       Current Facility-Administered Medications on File Prior to Visit  Medication Dose Route Frequency Provider Last Rate Last Dose  . cyanocobalamin ((VITAMIN B-12)) injection 1,000 mcg  1,000 mcg Intramuscular Once Romero BellingSean Ellison, MD        Allergies  Allergen Reactions  . Bee Venom Anaphylaxis  . Niacin     "makes  his crazy"    Family History  Problem Relation Age of Onset  . COPD Mother   . Lymphoma Father     BP 128/94  Pulse 86  Temp(Src) 98.7 F (37.1 C) (Oral)  Ht 6' (1.829 m)  Wt 273 lb (123.832 kg)  BMI 37.02 kg/m2  SpO2 96%   Review of Systems Denies cough and chest pain    Objective:   Physical Exam Pulses: dorsalis pedis intact bilat.   Feet: no deformity. normal color and temp.  no edema.  Skin:  no ulcer on the feet.   Neuro: sensation is intact to touch on the feet.    Lab Results  Component Value Date   HGBA1C 6.0* 11/25/2013   i have reviewed the following outside records: hosp notes    Assessment & Plan:  DM: good diet and exercise habits significanly improve the control of your diabetes.  please let me know if you wish to be referred to a dietician.  high blood sugar is very risky to your health.  you should see an eye doctor and dentist every year.  You are at higher than average risk for pneumonia and hepatitis-B.  You should be vaccinated against both.   PE's: new to me: pt is ref to pulm    Subjective:   Patient  here for Medicare annual wellness visit and management of other chronic and acute problems.     Risk factors: health problems  Roster of Physicians Providing Medical Care to Patient:  See "snapshot"   Activities of Daily Living: In your present state of health, do you have any difficulty performing the following activities?:  Preparing food and eating?: No  Bathing yourself: No  Getting dressed: No  Using the toilet:No  Moving around from place to place: No  In the past year have you fallen or had a near fall?: No    Home Safety: Has smoke detector and wears seat belts. Firearms are safely stored. No excess sun exposure.  Diet and Exercise  Current exercise habits: pt says good Dietary issues discussed: pt reports a healthy diet   Depression Screen  Q1: Over the past two weeks, have you felt down, depressed or hopeless? no  Q2: Over the past two weeks, have you felt little interest or pleasure in doing things? no   The following portions of the patient's history were reviewed and updated as appropriate: allergies, current medications, past family history, past medical history, past social history, past surgical history and problem list.  Past Medical History  Diagnosis Date  . CORONARY ARTERY DISEASE 12/05/2006  . DIABETES MELLITUS, TYPE II 12/05/2006  . HYPERCHOLESTEROLEMIA 07/17/2007  . ANEMIA, PERNICIOUS 04/03/2007  . HYPERTENSION 12/05/2006  . VISUAL ACUITY, DECREASED, RIGHT EYE 02/19/2009  . Learning disability     Past Surgical History  Procedure Laterality Date  . Repair left hand      History   Social History  . Marital Status: Married    Spouse Name: N/A    Number of Children: N/A  . Years of Education: N/A   Occupational History  . Disabled    Social History Main Topics  . Smoking status: Former Games developer  . Smokeless tobacco: Not on file  . Alcohol Use: Not on file  . Drug Use: Not on file  . Sexual Activity: Not on file   Other Topics Concern  . Not on  file   Social History Narrative   Disabled due to left wrist injury at  work    Current Outpatient Prescriptions on File Prior to Visit  Medication Sig Dispense Refill  . cyanocobalamin 500 MCG tablet Take 1,000 mcg by mouth daily.       . diazepam (VALIUM) 5 MG tablet Take 5 mg by mouth 3 (three) times daily.       . DULoxetine (CYMBALTA) 30 MG capsule Take 30 mg by mouth daily.        Marland Kitchen EPINEPHrine 0.3 mg/0.3 mL IJ SOAJ injection Inject 0.3 mg into the muscle once.      Marland Kitchen FLUoxetine (PROZAC) 40 MG capsule Take 40 mg by mouth daily.        Marland Kitchen JANUVIA 100 MG tablet TAKE 1 TABLET BY MOUTH EVERY DAY  30 tablet  0  . losartan (COZAAR) 50 MG tablet Take 50 mg by mouth daily.      . metFORMIN (GLUCOPHAGE-XR) 500 MG 24 hr tablet Take 2 tablets (1,000 mg total) by mouth 2 (two) times daily.      . pioglitazone (ACTOS) 45 MG tablet Take 45 mg by mouth daily.      Marland Kitchen pyridoxine (B-6) 100 MG tablet Take 100 mg by mouth daily.        . Rivaroxaban (XARELTO) 15 MG TABS tablet Take 1 tablet (15 mg total) by mouth 2 (two) times daily with a meal.  40 tablet  0  . [START ON 12/18/2013] rivaroxaban (XARELTO) 20 MG TABS tablet Take 1 tablet (20 mg total) by mouth daily before supper.  30 tablet  0  . simvastatin (ZOCOR) 40 MG tablet Take 40 mg by mouth daily.       . sitaGLIPtin (JANUVIA) 100 MG tablet Take 100 mg by mouth daily.       Current Facility-Administered Medications on File Prior to Visit  Medication Dose Route Frequency Provider Last Rate Last Dose  . cyanocobalamin ((VITAMIN B-12)) injection 1,000 mcg  1,000 mcg Intramuscular Once Romero Belling, MD        Allergies  Allergen Reactions  . Bee Venom Anaphylaxis  . Niacin     "makes his crazy"    Family History  Problem Relation Age of Onset  . COPD Mother   . Lymphoma Father     BP 128/94  Pulse 86  Temp(Src) 98.7 F (37.1 C) (Oral)  Ht 6' (1.829 m)  Wt 273 lb (123.832 kg)  BMI 37.02 kg/m2  SpO2 96%   Review of Systems  Denies  hearing loss, and visual loss Objective:   Vision:  Sees opthalmologist Hearing: grossly normal Body mass index:  See vs page Msk: pt easily and quickly performs "get-up-and-go" from a sitting position Cognitive Impairment Assessment: cognition, memory and judgment appear normal.  remembers 2/3 at 5 minutes (? Effort).  excellent recall.  can easily read and write a sentence.  alert and oriented x 3   Assessment:   Medicare wellness utd on preventive parameters    Plan:   During the course of the visit the patient was educated and counseled about appropriate screening and preventive services including:        Fall prevention  Diabetes screening  Nutrition counseling   Vaccines / LABS Pneumococcal Vaccine  today   Patient Instructions (the written plan) was given to the patient.

## 2013-12-03 NOTE — Patient Instructions (Addendum)
Please continue the same medications. Please see a lung specialist.  you will receive a phone call, about a day and time for an appointment.   Please come back for a follow-up appointment in 6 months. A urine test is requested for you today.  We'll contact you with results.  please consider these measures for your health:  minimize alcohol.  do not use tobacco products.  have a colonoscopy at least every 10 years from age 47.  keep firearms safely stored.  always use seat belts.  have working smoke alarms in your home.  see an eye doctor and dentist regularly.  never drive under the influence of alcohol or drugs (including prescription drugs).  those with fair skin should take precautions against the sun.

## 2013-12-05 MED ORDER — CYANOCOBALAMIN 1000 MCG/ML IJ SOLN
1000.0000 ug | Freq: Once | INTRAMUSCULAR | Status: AC
  Administered 2013-12-03: 1000 ug via INTRAMUSCULAR

## 2013-12-05 NOTE — Addendum Note (Signed)
Addended by: Bethann PunchesUCK, MEGAN E on: 12/05/2013 09:40 AM   Modules accepted: Orders

## 2013-12-10 ENCOUNTER — Ambulatory Visit: Payer: Medicare Other | Admitting: Nutrition

## 2013-12-10 ENCOUNTER — Other Ambulatory Visit: Payer: Self-pay | Admitting: Endocrinology

## 2013-12-10 MED ORDER — PIOGLITAZONE HCL 45 MG PO TABS: 45.0000 mg | ORAL_TABLET | Freq: Every day | ORAL | Status: DC

## 2013-12-11 NOTE — Discharge Summary (Signed)
Physician Discharge Summary  [name] GNF:[id number] DOB: 1966-02-26 DOA: 11/25/2013  PCP: Romero Belling, MD  Admit date: 11/25/2013 Discharge date: 11/27/2013  Time spent: 35 minutes  Recommendations for Outpatient Follow-up:  1. PCP in 1 week 2. FU complete Hypercoagulable panel 3. Monitor Leg lesions, if worsens   Discharge Diagnoses:  Active Problems:   PE (pulmonary embolism)   Blotchy areas in lower legs, likely due to recent DVTs, although dopplers negative   DM   Hypothyroidism   H/o CAD   HTN    Discharge Condition: stable  Diet recommendation: heart healthy  Filed Weights   11/25/13 2028 11/26/13 0132 11/27/13 0500  Weight: 125.646 kg (277 lb) 126.2 kg (278 lb 3.5 oz) 122.6 kg (270 lb 4.5 oz)    History of present illness:  History of Present Illness:This is a 48 y.o. year old male with significant past medical history of HTN, DM, hypothyroidism presenting with PE, LE lesions. Pt states that he noticed progressive erythema and pain in LEs over the course of the past 24 hours. Has also had some generalized malaise and chills. Denies any CP or SOB. No recent change in medications. Diabetes has been stable. Reports that he took and extended trip to [place] 3-4 weeks ago.  Presented to ER hemodynamically stable. Afebrile. Satting >98% on RA. WBC 7.6, hgb 12.9, Cr 1.11, Glu 106. CT Angio shows several subsegmental RLL pulm emboli without heart strain with areas of patchy atelectasis mainly in RML. Bedside U/S performed on LEs that was preliminarily negative for DVT. Pt started prophylactically on vanc for cellulitis coverage prior to CT Angio.    Hospital Course:  Acute PE,  -was started on heparin on admission, then transitioned to Xarelto yesterday after discussion of options -Hypercoagulable workup ordered and pending, will need to be FU as outpatient -only possible risk factor is recent extended trip to [place] 3 weeks back. -Will need anticoagulation atleast for  3-63months  Blotchy discolored areas on both legs -improving -etiology unclear, suspect recent DVT that migrated to lungs -Dopplers negative for DVT at this time.  -clinically no cellutlitis -if worsens will need heme or vascular evaluation -no symptoms at this time   Procedures:  Dopplers: negative for DVT  Discharge Exam: Filed Vitals:   11/27/13 0343  BP: 119/77  Pulse: 69  Temp: 97.6 F (36.4 C)  Resp: 18    General: AAOx3 Cardiovascular: S1S2/RRR Respiratory: CTAB  Discharge Instructions You were cared for by a hospitalist during your hospital stay. If you have any questions about your discharge medications or the care you received while you were in the hospital after you are discharged, you can call the unit and asked to speak with the hospitalist on call if the hospitalist that took care of you is not available. Once you are discharged, your primary care physician will handle any further medical issues. Please note that NO REFILLS for any discharge medications will be authorized once you are discharged, as it is imperative that you return to your primary care physician (or establish a relationship with a primary care physician if you do not have one) for your aftercare needs so that they can reassess your need for medications and monitor your lab values.  Discharge Instructions   Diet Carb Modified    Complete by:  As directed      Increase activity slowly    Complete by:  As directed             Medication List  STOP taking these medications       aspirin 81 MG tablet      TAKE these medications       cyanocobalamin 500 MCG tablet  Take 1,000 mcg by mouth daily.     diazepam 5 MG tablet  Commonly known as:  VALIUM  Take 5 mg by mouth 3 (three) times daily.     DULoxetine 30 MG capsule  Commonly known as:  CYMBALTA  Take 30 mg by mouth daily.     EPINEPHrine 0.3 mg/0.3 mL Soaj injection  Commonly known as:  EPI-PEN  Inject 0.3 mg into the muscle  once.     FLUoxetine 40 MG capsule  Commonly known as:  PROZAC  Take 40 mg by mouth daily.     losartan 50 MG tablet  Commonly known as:  COZAAR  Take 50 mg by mouth daily.     metFORMIN 500 MG 24 hr tablet  Commonly known as:  GLUCOPHAGE-XR  Take 2 tablets (1,000 mg total) by mouth 2 (two) times daily.     pioglitazone 45 MG tablet  Commonly known as:  ACTOS  Take 45 mg by mouth daily.     pyridoxine 100 MG tablet  Commonly known as:  B-6  Take 100 mg by mouth daily.     Rivaroxaban 15 MG Tabs tablet  Commonly known as:  XARELTO  Take 1 tablet (15 mg total) by mouth 2 (two) times daily with a meal.     rivaroxaban 20 MG Tabs tablet  Commonly known as:  XARELTO  Take 1 tablet (20 mg total) by mouth daily before supper.  Start taking on:  12/18/2013     simvastatin 40 MG tablet  Commonly known as:  ZOCOR  Take 40 mg by mouth daily.     sitaGLIPtin 100 MG tablet  Commonly known as:  JANUVIA  Take 100 mg by mouth daily.       Allergies  Allergen Reactions  . Bee Venom Anaphylaxis  . Niacin     "makes his crazy"       Follow-up Information   Follow up with Romero Belling, MD. Schedule an appointment as soon as possible for a visit in 1 week.   Specialty:  Endocrinology   Contact information:   [address] [place] [address] [phone]        The results of significant diagnostics from this hospitalization (including imaging, microbiology, ancillary and laboratory) are listed below for reference.    Significant Diagnostic Studies: Dg Chest 2 View  11/25/2013   CLINICAL DATA:  Shortness of Breath  EXAM: CHEST  2 VIEW  COMPARISON:  February 13, 2011  FINDINGS: There is slight scarring in the right middle lobe. Elsewhere lungs are clear. Heart size and pulmonary vascularity are normal. No adenopathy. No bone lesions.  IMPRESSION: No edema or consolidation.  Mild scarring right middle lobe.   Electronically Signed   By: Bretta Bang M.D.    On: 11/25/2013 23:23   Ct Angio Chest Pe W/cm &/or Wo Cm  11/25/2013   CLINICAL DATA:  Difficulty breathing and chest pain  EXAM: CT ANGIOGRAPHY CHEST WITH CONTRAST  TECHNIQUE: Multidetector CT imaging of the chest was performed using the standard protocol during bolus administration of intravenous contrast. Multiplanar CT image reconstructions and MIPs were obtained to evaluate the vascular anatomy.  CONTRAST:  80mL OMNIPAQUE IOHEXOL 350 MG/ML SOLN  COMPARISON:  Chest radiograph November 25, 2013 and chest CT Oct 12, 2005  FINDINGS: There are several small subsegmental right lower lobe pulmonary emboli. No larger pulmonary emboli are appreciable. There is no evidence suggesting right heart strain. There is no thoracic aortic aneurysm or dissection.  There is patchy atelectasis in the right middle lobe and in both lung bases posteriorly. Lungs elsewhere are clear. There is moderate mediastinal fat. There is no thoracic adenopathy. The pericardium is not appreciably thickened.  In the visualized upper abdomen, there is fatty change in the liver. There are no appreciable blastic or lytic bone lesions. Thyroid appears normal.  Review of the MIP images confirms the above findings.  IMPRESSION: Several subsegmental right lower lobe pulmonary emboli. No larger pulmonary emboli. No demonstrable right heart strain. Areas of patchy atelectasis, mainly in the right middle lobe. Lungs elsewhere clear. Fatty liver present.  Critical Value/emergent results were called by telephone at the time of interpretation on 11/25/2013 at 11:43 pm to [facility] LLCANNAH MUTHERSBAUGH, PA , who verbally acknowledged these results.   Electronically Signed   By: Bretta BangWilliam  Woodruff M.D.   On: 11/25/2013 23:44    Microbiology: No results found for this or any previous visit (from the past 240 hour(s)).   Labs: Basic Metabolic Panel: No results found for this basename: NA, K, CL, CO2, GLUCOSE, BUN, CREATININE, CALCIUM, MG, PHOS,  in the last 168  hours Liver Function Tests: No results found for this basename: AST, ALT, ALKPHOS, BILITOT, PROT, ALBUMIN,  in the last 168 hours No results found for this basename: LIPASE, AMYLASE,  in the last 168 hours No results found for this basename: AMMONIA,  in the last 168 hours CBC: No results found for this basename: WBC, NEUTROABS, HGB, HCT, MCV, PLT,  in the last 168 hours Cardiac Enzymes: No results found for this basename: CKTOTAL, CKMB, CKMBINDEX, TROPONINI,  in the last 168 hours BNP: BNP (last 3 results)  Recent Labs  11/26/13 0525  PROBNP 49.4   CBG: No results found for this basename: GLUCAP,  in the last 168 hours     Signed:  JOSEPH,PREETHA  [facility] 12/11/2013, 5:35 PM

## 2013-12-14 ENCOUNTER — Other Ambulatory Visit: Payer: Self-pay | Admitting: Endocrinology

## 2013-12-16 ENCOUNTER — Encounter: Payer: Self-pay | Admitting: Internal Medicine

## 2013-12-16 ENCOUNTER — Ambulatory Visit (INDEPENDENT_AMBULATORY_CARE_PROVIDER_SITE_OTHER): Payer: Medicare Other | Admitting: Internal Medicine

## 2013-12-16 VITALS — BP 142/90 | HR 68 | Temp 97.8°F | Ht 72.0 in | Wt 272.0 lb

## 2013-12-16 DIAGNOSIS — L0390 Cellulitis, unspecified: Secondary | ICD-10-CM | POA: Diagnosis not present

## 2013-12-16 DIAGNOSIS — I2699 Other pulmonary embolism without acute cor pulmonale: Secondary | ICD-10-CM

## 2013-12-16 DIAGNOSIS — L0291 Cutaneous abscess, unspecified: Secondary | ICD-10-CM

## 2013-12-16 NOTE — Patient Instructions (Signed)
Keep toes as dry and clean as you can and if you still have cracks in the toe webs Dr Everardo AllEllison may need to send you to a dermatologist (tenactin powder)  Work on weight loss and elevate legs as much as you can   Please schedule a follow up visit in 6 months but call sooner if needed

## 2013-12-16 NOTE — Progress Notes (Signed)
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 PCP in 1 week 2. FU complete Hypercoagulable panel 3. Monitor Leg lesions, if worsen Discharge Diagnoses:   PE (pulmonary embolism)  Blotchy areas in lower legs, likely due to recent DVTs, although dopplers negative  DM  Hypothyroidism  H/o CAD  HTN  Discharge Condition: stable  Diet recommendation: heart healthy  Filed Weights    11/25/13 2028  11/26/13 0132  11/27/13 0500   Weight:  125.646 kg (277 lb)  126.2 kg (278 lb 3.5 oz)  122.6 kg (270 lb 4.5 oz)   History of present illness:  History of Present Illness:This is a 48 y.o. year old male with significant past medical history of HTN, DM, hypothyroidism presenting with PE, LE lesions. Pt states that he noticed progressive erythema and pain in LEs over the course of the past 24 hours. Has also had some generalized malaise and chills. Denies any CP or SOB. No recent change in medications. Diabetes has been stable. Reports that he took and extended trip to [name] 3[place]4 weeks ago.  Presented to ER hemodynamically stable. Afebrile. Satting >98% on RA. WBC 7.6, hgb 12.9, Cr 1.11, Glu 106. CT Angio shows several subsegmental RLL pulm emboli without heart strain with areas of patchy atelectasis mainly in RML. Bedside U/S performed on LEs that was preliminarily negative for DVT. Pt started prophylactically on vanc for cellulitis coverage prior to CT Angio.  Hospital Course:  Acute PE,  -was started on heparin on admission, then transitioned to Xarelto after discussion of options  -Hypercoagulable workup ordered and pending, will need to be FU as outpatient  -only possible risk factor is recent  extended trip to [name] 3 weeks back.   Blotchy discolored areas on both legs  -improving  -etiology unclear, suspect recent DVT that migrated to lungs  -Dopplers negative for DVT at this time.  -clinically no cellutlitis  -if worsens will need heme or vascular evaluation  -no symptoms at this time  Procedures:  Dopplers: negative for DVT   12/17/2013 1st [facility] office visit/ [name] c Chief Complaint  Patient presents with  . Pulmonary Consult    Referred by Dr. Ever[name] for PE. Pt recently d/c from [name] for [place].   doe improved over baseline. Legs nearly back to nl  No obvious day to day or daytime variabilty or assoc chronic cough or cp or chest tightness, subjective wheeze overt sinus or hb symptoms. No unusual exp hx or h/o childhood pna/ asthma or knowledge of premature birth.  Sleeping ok without nocturnal  or early am exacerbation  of respiratory  c/o's or need for noct saba. Also denies any obvious fluctuation of symptoms with weather or environmental changes or other aggravating or alleviating factors except as outlined above   Current Medications, Allergies, Complete Past Medical History, Past Surgical History, Family History, and Social History were reviewed in [employer] record.         Review of Systems  Constitutional: Negative for fever and unexpected weight change.  HENT: Negative for congestion, dental problem, ear pain, nosebleeds, postnasal drip, rhinorrhea, sinus pressure, sneezing, sore throat and trouble  swallowing.   Eyes: Negative for redness and itching.  Respiratory: Positive for shortness of breath. Negative for cough, chest tightness and wheezing.   Cardiovascular: Positive for palpitations and leg swelling.  Gastrointestinal: Negative for nausea and vomiting.  Genitourinary: Negative for dysuria.  Musculoskeletal: Positive for joint swelling.  Skin: Negative for rash.  Neurological: Negative for headaches.  Hematological:  Does not bruise/bleed easily.  Psychiatric/Behavioral: Negative for dysphoric mood. The patient is not nervous/anxious.        Objective:   Physical Exam  Wt Readings from Last 3 Encounters:  12/16/13 272 lb (123.378 kg)  12/03/13 273 lb (123.832 kg)  11/27/13 270 lb 4.5 oz (122.6 kg)     Obese amb wm nad   HEENT: nl dentition, turbinates, and orophanx. Nl external ear canals without cough reflex   NECK :  without JVD/Nodes/TM/ nl carotid upstrokes bilaterally   LUNGS: no acc muscle use, clear to A and P bilaterally without cough on insp or exp maneuvers   CV:  RRR  no s3 or murmur or increase in P2, no edema   ABD:  soft and nontender with nl excursion in the supine position. No bruits or organomegaly, bowel sounds nl  MS:  warm without deformities, calf tenderness, cyanosis or clubbing  SKIN: warm and dry with hyperpigmentation both LE no active cellulitis changes    NEURO:  alert, approp, no deficits          Assessment & Plan:

## 2013-12-17 DIAGNOSIS — L0390 Cellulitis, unspecified: Secondary | ICD-10-CM | POA: Insufficient documentation

## 2013-12-17 NOTE — Assessment & Plan Note (Signed)
Likely related to poor venous / lymph return from [place] and low grade tinea pedis is the barrier defect > advised re RX   See instructions for specific recommendations which were reviewed directly with the patient who was given a copy with highlighter outlining the key components.

## 2013-12-17 NOTE — Assessment & Plan Note (Addendum)
See CTa  11/25/13 only subsegmental level - Echo 11/26/13 nl - Venous dopplers 11/26/13 nl  Most likely the new doe was related to PE but the reason he has admitted was cellulitis, both likely related to mo / sedentary lifestyle Discussed in detail all the  indications, usual  risks and alternatives  relative to the benefits with patient who agrees to proceed with 6 m rx then regroup for hypercoagulable w/u but work on wt in meantime

## 2013-12-28 ENCOUNTER — Other Ambulatory Visit: Payer: Self-pay | Admitting: Endocrinology

## 2014-01-03 ENCOUNTER — Other Ambulatory Visit: Payer: Self-pay | Admitting: Endocrinology

## 2014-01-10 ENCOUNTER — Other Ambulatory Visit: Payer: Self-pay | Admitting: Endocrinology

## 2014-01-14 ENCOUNTER — Ambulatory Visit (INDEPENDENT_AMBULATORY_CARE_PROVIDER_SITE_OTHER): Payer: Medicare Other | Admitting: Endocrinology

## 2014-01-14 ENCOUNTER — Encounter: Payer: Self-pay | Admitting: Endocrinology

## 2014-01-14 ENCOUNTER — Other Ambulatory Visit: Payer: Self-pay | Admitting: Endocrinology

## 2014-01-14 VITALS — BP 118/78 | HR 60 | Temp 97.8°F | Ht 72.0 in | Wt 268.0 lb

## 2014-01-14 DIAGNOSIS — D510 Vitamin B12 deficiency anemia due to intrinsic factor deficiency: Secondary | ICD-10-CM

## 2014-01-14 DIAGNOSIS — E119 Type 2 diabetes mellitus without complications: Secondary | ICD-10-CM

## 2014-01-14 MED ORDER — RIVAROXABAN 20 MG PO TABS: 20.0000 mg | ORAL_TABLET | Freq: Every day | ORAL | Status: DC

## 2014-01-14 MED ORDER — EPINEPHRINE 0.3 MG/0.3ML IJ SOAJ: 0.3000 mg | Freq: Once | INTRAMUSCULAR | Status: DC

## 2014-01-14 MED ORDER — SITAGLIPTIN PHOSPHATE 100 MG PO TABS: ORAL_TABLET | ORAL | Status: DC

## 2014-01-14 MED ORDER — CYANOCOBALAMIN 1000 MCG/ML IJ SOLN
1000.0000 ug | Freq: Once | INTRAMUSCULAR | Status: AC
  Administered 2014-01-14: 1000 ug via INTRAMUSCULAR

## 2014-01-14 NOTE — Patient Instructions (Addendum)
Please stop taking the pioglitizone (actos), as this can contribute to your swelling.  Please come back for a follow-up appointment in 2 months.  Please see a weight-loss surgery specialist.  you will receive a phone call, about a day and time for an appointment.

## 2014-01-14 NOTE — Progress Notes (Signed)
Subjective:    Patient ID: [name], male    DOB: 1965/09/10, 48 y.o.   MRN: [id number]  HPI Pt returns for f/u of type 2 dm (dx'ed 2005; he has mild if any neuropathy of the lower extremities, but he has associated CAD; he has never taken insulin, except in the hospital).  He has few years of slight swelling in the legs, but no assoc itching. Past Medical History  Diagnosis Date  . CORONARY ARTERY DISEASE 12/05/2006  . DIABETES MELLITUS, TYPE II 12/05/2006  . HYPERCHOLESTEROLEMIA 07/17/2007  . ANEMIA, PERNICIOUS 04/03/2007  . HYPERTENSION 12/05/2006  . VISUAL ACUITY, DECREASED, RIGHT EYE 02/19/2009  . Learning disability   . PE (pulmonary embolism)     Past Surgical History  Procedure Laterality Date  . Repair left hand    . Triple hernia repair      History   Social History  . Marital Status: Married    Spouse Name: N/A    Number of Children: N/A  . Years of Education: N/A   Occupational History  . Disabled    Social History Main Topics  . Smoking status: Former Smoker -- 0.10 packs/day for 3 years    Types: Cigarettes    Quit date: 05/15/1984  . Smokeless tobacco: Never Used  . Alcohol Use: Yes     Comment: Occassional  . Drug Use: No  . Sexual Activity: Not on file   Other Topics Concern  . Not on file   Social History Narrative   Disabled due to left wrist injury at work    Current Outpatient Prescriptions on File Prior to Visit  Medication Sig Dispense Refill  . cyanocobalamin (,VITAMIN B-12,) 1000 MCG/ML injection Inject 1,000 mcg into the muscle every 30 (thirty) days.      . diazepam (VALIUM) 5 MG tablet Take 5 mg by mouth 2 (two) times daily.       . DULoxetine (CYMBALTA) 30 MG capsule Take 30 mg by mouth daily.        Marland Kitchen FLUoxetine (PROZAC) 40 MG capsule Take 40 mg by mouth daily.        Marland Kitchen losartan (COZAAR) 50 MG tablet Take 50 mg by mouth daily.      . metFORMIN (GLUCOPHAGE-XR) 500 MG 24 hr tablet Take 2 tablets (1,000 mg total) by mouth 2 (two)  times daily.      Marland Kitchen pyridoxine (B-6) 100 MG tablet Take 100 mg by mouth daily.        . simvastatin (ZOCOR) 40 MG tablet TAKE 1 TABLET AT BEDTIME  30 tablet  1   No current facility-administered medications on file prior to visit.    Allergies  Allergen Reactions  . Bee Venom Anaphylaxis  . Niacin     "makes his crazy"    Family History  Problem Relation Age of Onset  . COPD Mother   . Lymphoma Father     BP 118/78  Pulse 60  Temp(Src) 97.8 F (36.6 C) (Oral)  Ht 6' (1.829 m)  Wt 268 lb (121.564 kg)  BMI 36.34 kg/m2  SpO2 98%    Review of Systems Sob is resolved.  He has lost a few lbs    Objective:   Physical Exam VITAL SIGNS:  See vs page GENERAL: no distress Pulses: dorsalis pedis intact bilat.   Feet: no deformity. normal color and temp.  Trace bilat leg edema Skin:  no ulcer on the feet.   Neuro: sensation is intact to  touch on the feet  Lab Results  Component Value Date   HGBA1C 6.0* 11/25/2013       Assessment & Plan:  Edema, new, possibly caused or contributed to by the actos.   DM: well-controlled Obesity: persistent.  Patient is advised the following: Patient Instructions  Please stop taking the pioglitizone (actos), as this can contribute to your swelling.  Please come back for a follow-up appointment in 2 months.  Please see a weight-loss surgery specialist.  you will receive a phone call, about a day and time for an appointment.

## 2014-01-27 ENCOUNTER — Other Ambulatory Visit: Payer: Self-pay | Admitting: Endocrinology

## 2014-01-27 NOTE — Telephone Encounter (Signed)
Please advise if ok to refill med is listed under historical provider. Thanks!  

## 2014-02-18 ENCOUNTER — Other Ambulatory Visit: Payer: Self-pay | Admitting: Endocrinology

## 2014-02-18 NOTE — Telephone Encounter (Signed)
Lease advise if ok to refill. Med is not listed on current list. Thanks!

## 2014-03-16 ENCOUNTER — Ambulatory Visit (INDEPENDENT_AMBULATORY_CARE_PROVIDER_SITE_OTHER): Payer: Medicare Other | Admitting: Endocrinology

## 2014-03-16 ENCOUNTER — Encounter: Payer: Self-pay | Admitting: Endocrinology

## 2014-03-16 VITALS — BP 136/90 | HR 69 | Temp 98.3°F | Ht 72.0 in | Wt 265.0 lb

## 2014-03-16 DIAGNOSIS — D510 Vitamin B12 deficiency anemia due to intrinsic factor deficiency: Secondary | ICD-10-CM

## 2014-03-16 DIAGNOSIS — E119 Type 2 diabetes mellitus without complications: Secondary | ICD-10-CM

## 2014-03-16 MED ORDER — CYANOCOBALAMIN 1000 MCG/ML IJ SOLN
1000.0000 ug | Freq: Once | INTRAMUSCULAR | Status: AC
  Administered 2014-03-16: 1000 ug via INTRAMUSCULAR

## 2014-03-16 NOTE — Patient Instructions (Addendum)
Please keep the foot ulcer covered with antibiotic ointment and a bandaid.  Call if it gets worse. Please come back for a follow-up appointment in January.    Please continue to pursue the weight loss surgery. blood tests are being requested for you today.  We'll contact you with results.   If it is high, we can add "bromocriptine."

## 2014-03-16 NOTE — Progress Notes (Signed)
Subjective:    Patient ID: [name], male    DOB: Dec 09, 1965, 48 y.o.   MRN: [id number]  HPI  Pt returns for f/u of diabetes mellitus: DM type: 2 Dx'ed: 2005 Complications: CAD Therapy: 2 oral meds DKA: never Severe hypoglycemia: never Pancreatitis: never Other: he did not tolerate pioglitizone (edema); he has never taken insulin, except in the hospital.   Interval history: Since off the actos, edema is better.  He has a few days of slight ulcer at the left foot, but no assoc itching Past Medical History  Diagnosis Date  . CORONARY ARTERY DISEASE 12/05/2006  . DIABETES MELLITUS, TYPE II 12/05/2006  . HYPERCHOLESTEROLEMIA 07/17/2007  . ANEMIA, PERNICIOUS 04/03/2007  . HYPERTENSION 12/05/2006  . VISUAL ACUITY, DECREASED, RIGHT EYE 02/19/2009  . Learning disability   . PE (pulmonary embolism)     Past Surgical History  Procedure Laterality Date  . Repair left hand    . Triple hernia repair      History   Social History  . Marital Status: Married    Spouse Name: N/A    Number of Children: N/A  . Years of Education: N/A   Occupational History  . Disabled    Social History Main Topics  . Smoking status: Former Smoker -- 0.10 packs/day for 3 years    Types: Cigarettes    Quit date: 05/15/1984  . Smokeless tobacco: Never Used  . Alcohol Use: Yes     Comment: Occassional  . Drug Use: No  . Sexual Activity: Not on file   Other Topics Concern  . Not on file   Social History Narrative   Disabled due to left wrist injury at work    Current Outpatient Prescriptions on File Prior to Visit  Medication Sig Dispense Refill  . cyanocobalamin (,VITAMIN B-12,) 1000 MCG/ML injection Inject 1,000 mcg into the muscle every 30 (thirty) days.    . diazepam (VALIUM) 5 MG tablet Take 5 mg by mouth 2 (two) times daily.     . DULoxetine (CYMBALTA) 30 MG capsule Take 30 mg by mouth daily.      Marland Kitchen. EPINEPHrine (EPIPEN 2-PAK) 0.3 mg/0.3 mL IJ SOAJ injection Inject 0.3 mLs (0.3 mg  total) into the muscle once. 2 Device 5  . FLUoxetine (PROZAC) 40 MG capsule Take 40 mg by mouth daily.      Marland Kitchen. losartan (COZAAR) 50 MG tablet Take 50 mg by mouth daily.    Marland Kitchen. losartan (COZAAR) 50 MG tablet TAKE 1 TABLET EVERY DAY 30 tablet 5  . metFORMIN (GLUCOPHAGE-XR) 500 MG 24 hr tablet Take 2 tablets (1,000 mg total) by mouth 2 (two) times daily.    Marland Kitchen. pyridoxine (B-6) 100 MG tablet Take 100 mg by mouth daily.      . rivaroxaban (XARELTO) 20 MG TABS tablet Take 1 tablet (20 mg total) by mouth daily before supper. 30 tablet 5  . simvastatin (ZOCOR) 40 MG tablet TAKE 1 TABLET AT BEDTIME 30 tablet 1  . sitaGLIPtin (JANUVIA) 100 MG tablet TAKE 1 TABLET BY MOUTH EVERY DAY 30 tablet 11   No current facility-administered medications on file prior to visit.    Allergies  Allergen Reactions  . Bee Venom Anaphylaxis  . Niacin     "makes his crazy"    Family History  Problem Relation Age of Onset  . COPD Mother   . Lymphoma Father     BP 136/90 mmHg  Pulse 69  Temp(Src) 98.3 F (36.8 C) (Oral)  Ht 6' (1.829 m)  Wt 265 lb (120.203 kg)  BMI 35.93 kg/m2  SpO2 93%   Review of Systems Denies fever.  He has lost a few lbs.      Objective:   Physical Exam VITAL SIGNS:  See vs page GENERAL: no distress Pulses: dorsalis pedis intact bilat.   Feet: no deformity.  no edema Skin:  no ulcer on the feet.  normal color and temp.  There is a few mm shallow ulcer at the dorsal aspect of the left foot. No erythema.   Neuro: sensation is intact to touch on the feet.        Assessment & Plan:  Small ulcer, new DM: he needs to decrease amaryl.  Patient is advised the following: Patient Instructions  Please keep the foot ulcer covered with antibiotic ointment and a bandaid.  Call if it gets worse. Please come back for a follow-up appointment in January.    Please continue to pursue the weight loss surgery. blood tests are being requested for you today.  We'll contact you with results.     If it is high, we can add "bromocriptine."

## 2014-03-17 LAB — HEMOGLOBIN A1C
Hgb A1c MFr Bld: 6.2 % — ABNORMAL HIGH (ref <5.7)
Mean Plasma Glucose: 131 mg/dL — ABNORMAL HIGH (ref <117)

## 2014-04-01 ENCOUNTER — Other Ambulatory Visit: Payer: Self-pay | Admitting: Endocrinology

## 2014-04-02 ENCOUNTER — Other Ambulatory Visit: Payer: Self-pay | Admitting: Endocrinology

## 2014-04-02 NOTE — Telephone Encounter (Signed)
Please advise if ok to refill. Medication is not on current med list. Thanks!  

## 2014-05-15 DIAGNOSIS — I2699 Other pulmonary embolism without acute cor pulmonale: Secondary | ICD-10-CM

## 2014-05-15 DIAGNOSIS — I82409 Acute embolism and thrombosis of unspecified deep veins of unspecified lower extremity: Secondary | ICD-10-CM

## 2014-05-15 HISTORY — DX: Other pulmonary embolism without acute cor pulmonale: I26.99

## 2014-05-15 HISTORY — DX: Acute embolism and thrombosis of unspecified deep veins of unspecified lower extremity: I82.409

## 2014-05-19 ENCOUNTER — Telehealth: Payer: Self-pay | Admitting: Endocrinology

## 2014-05-19 ENCOUNTER — Ambulatory Visit: Payer: Medicare Other | Admitting: Endocrinology

## 2014-05-19 NOTE — Telephone Encounter (Signed)
Follow up advised. Contact patient and schedule visit in 2 weeks. 

## 2014-05-19 NOTE — Telephone Encounter (Signed)
Patient no showed today's appt. Please advise on how to follow up. °A. No follow up necessary. °B. Follow up urgent. Contact patient immediately. °C. Follow up necessary. Contact patient and schedule visit in ___ days. °D. Follow up advised. Contact patient and schedule visit in ____weeks. ° °

## 2014-05-20 NOTE — Telephone Encounter (Signed)
No show letter set to pt.

## 2014-05-30 ENCOUNTER — Other Ambulatory Visit: Payer: Self-pay | Admitting: Endocrinology

## 2014-06-15 ENCOUNTER — Encounter: Payer: Self-pay | Admitting: Internal Medicine

## 2014-06-15 ENCOUNTER — Ambulatory Visit (INDEPENDENT_AMBULATORY_CARE_PROVIDER_SITE_OTHER): Payer: Medicare Other | Admitting: Internal Medicine

## 2014-06-15 VITALS — BP 122/88 | HR 83 | Temp 97.6°F | Ht 72.0 in | Wt 270.8 lb

## 2014-06-15 DIAGNOSIS — I2699 Other pulmonary embolism without acute cor pulmonale: Secondary | ICD-10-CM

## 2014-06-15 MED ORDER — RIVAROXABAN 20 MG PO TABS: 20.0000 mg | ORAL_TABLET | Freq: Every day | ORAL | Status: DC

## 2014-06-15 NOTE — Patient Instructions (Addendum)
Weight control is simply a matter of calorie balance which needs to be tilted in your favor by eating less and exercising more.  To get the most out of exercise, you need to be continuously aware that you are short of breath, but never out of breath, for 30 minutes daily. As you improve, it will actually be easier for you to do the same amount of exercise  in  30 minutes so always push to the level where you are short of breath.  If this does not result in gradual weight reduction then I strongly recommend you see a nutritionist with a food diary x 2 weeks so that we can work out a negative calorie balance which is universally effective in steady weight loss programs.  Think of your calorie balance like you do your bank account where in this case you want the balance to go down so you must take in less calories than you burn up.  It's just that simple:  Hard to do, but easy to understand.  Good luck!   Continue xarelto unless there is a reasons to stop it for the next 6 months then return here to regroup

## 2014-06-15 NOTE — Progress Notes (Signed)
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 year old male with significant past medical history of HTN, DM, hypothyroidism presenting with PE, LE lesions. Pt states that he noticed progressive erythema and pain in LEs over the course of the past 24 hours. Has also had some generalized malaise and chills. Denies any CP or SOB. No recent change in medications. Diabetes has been stable. Reports that he took and extended trip to [place] 3-4 weeks ago.  Presented to ER hemodynamically stable. Afebrile. Satting >98% on RA. WBC 7.6, hgb 12.9, Cr 1.11, Glu 106. CT Angio shows several subsegmental RLL pulm emboli without heart strain with areas of patchy atelectasis mainly in RML. Bedside U/S performed on LEs that was preliminarily negative for DVT. Pt started prophylactically on vanc for cellulitis coverage prior to CT Angio.  Hospital Course:  Acute PE,  -was started on heparin on admission, then transitioned to Xarelto after discussion of options  -Hypercoagulable workup ordered and pending, will need to be FU as outpatient> not done   -only possible risk factor is recent extended trip to [place] 3 weeks back.  Blotchy discolored areas on both legs  -improving  -etiology unclear, suspect recent DVT that migrated to lungs  -Dopplers negative for DVT at this time.  -clinically no cellutlitis  -if worsens will need heme or vascular evaluation  -no symptoms at this time    Procedures:  Dopplers: negative for DVT   12/17/2013 1st [facility] office visit/ Wert   Chief Complaint  Patient presents with  . Pulmonary Consult    Referred by Dr. Everardo All for PE. Pt recently d/c from [facility] for PE.   doe improved over baseline. Legs nearly back to nl rec Keep toes as dry and clean as you can and if you still have cracks in the toe webs Dr Everardo All may need to send you to a dermatologist (tenactin powder) Work on weight loss and elevate legs as much as you can    06/15/2014 f/u ov/Wert re:   PE f/u @ 6 m on xarelto Chief Complaint  Patient presents with  . Follow-up    Pt denies any breathing issues at this time.    Not limited by breathing from desired activities  / legs better   No   chronic cough or cp or chest tightness, subjective wheeze overt sinus or hb symptoms. No unusual exp hx or h/o childhood pna/ asthma or knowledge of premature birth.  Sleeping ok without nocturnal  or early am exacerbation  of respiratory  c/o's or need for noct saba. Also denies any obvious fluctuation of symptoms with weather or environmental changes or other aggravating or alleviating factors except as outlined above   Current Medications, Allergies, Complete Past Medical History, Past Surgical History, Family History, and Social History were reviewed in [employer] record.  ROS  The following are  not active complaints unless bolded sore throat, dysphagia, dental problems, itching, sneezing,  nasal congestion or excess/ purulent secretions, ear ache,   fever, chills, sweats, unintended wt loss, pleuritic or exertional cp, hemoptysis,  orthopnea pnd or leg swelling, presyncope, palpitations, heartburn, abdominal pain, anorexia, nausea, vomiting, diarrhea  or change in bowel or urinary habits, change in stools or urine, dysuria,hematuria,  rash, arthralgias, visual complaints, headache, numbness weakness or ataxia or problems with walking or coordination,   change in mood/affect or memory.            Objective:   Physical Exam  06/15/2014          271 Wt Readings from Last 3 Encounters:  12/16/13 272 lb (123.378 kg)  12/03/13 273 lb (123.832 kg)  11/27/13 270 lb 4.5 oz (122.6 kg)     Obese amb wm nad   HEENT: nl dentition, turbinates, and orophanx. Nl external ear canals without cough reflex   NECK :  without JVD/Nodes/TM/ nl carotid upstrokes bilaterally   LUNGS: no acc muscle use, clear to A and P bilaterally without cough on insp or exp maneuvers   CV:  RRR  no s3 or murmur or increase in P2, no edema   ABD:  soft and nontender with nl excursion in the supine position. No bruits or organomegaly, bowel sounds nl  MS:  warm without deformities, calf tenderness, cyanosis or clubbing  SKIN: warm and dry with hyperpigmentation both LE no active cellulitis changes    NEURO:  alert, approp, no deficits          Assessment & Plan:

## 2014-06-17 ENCOUNTER — Encounter: Payer: Self-pay | Admitting: Internal Medicine

## 2014-06-17 NOTE — Assessment & Plan Note (Addendum)
See CTa  11/25/13 only subsegmental level - Echo 11/26/13 nl - Venous dopplers 11/26/13 nl   I had an extended discussion with the patient and wife reviewing all relevant studies completed to date and  lasting 15 to 20 minutes of a 25 minute visit on the following ongoing concerns:    1) no evidence at all of CTEPAH or other complications of PE nor ongoing PE @ 6 m on xarelto, which he is tolerating well 2) further xarelto rx is therefore optional with the main risk of clotting = obesity as the hypercoagulable profile was not done but only needs to be done p off xarelto which he would like to continue p discuss benefits of reducing longterm recurrence  Therefore rx x 6 m then regroup, in meantime work on wt loss exp since no doe  See instructions for specific recommendations which were reviewed directly with the patient who was given a copy with highlighter outlining the key components.

## 2014-06-19 ENCOUNTER — Ambulatory Visit (INDEPENDENT_AMBULATORY_CARE_PROVIDER_SITE_OTHER): Payer: Medicare Other | Admitting: Endocrinology

## 2014-06-19 ENCOUNTER — Encounter: Payer: Self-pay | Admitting: Endocrinology

## 2014-06-19 VITALS — BP 132/86 | HR 77 | Temp 98.0°F | Ht 72.0 in | Wt 272.0 lb

## 2014-06-19 DIAGNOSIS — E119 Type 2 diabetes mellitus without complications: Secondary | ICD-10-CM

## 2014-06-19 DIAGNOSIS — H547 Unspecified visual loss: Secondary | ICD-10-CM | POA: Diagnosis not present

## 2014-06-19 DIAGNOSIS — D510 Vitamin B12 deficiency anemia due to intrinsic factor deficiency: Secondary | ICD-10-CM

## 2014-06-19 LAB — HEMOGLOBIN A1C: Hgb A1c MFr Bld: 6.5 % (ref 4.6–6.5)

## 2014-06-19 MED ORDER — CYANOCOBALAMIN 1000 MCG/ML IJ SOLN
1000.0000 ug | Freq: Once | INTRAMUSCULAR | Status: AC
  Administered 2014-06-19: 1000 ug via INTRAMUSCULAR

## 2014-06-19 NOTE — Progress Notes (Signed)
Subjective:    Patient ID: Xavier [name], male    DOB: 1965/10/31, 49 y.o.   MRN: [id number]  HPI Pt returns for f/u of diabetes mellitus: DM type: 2 Dx'ed: 2005 Complications: CAD Therapy: 2 oral meds DKA: never Severe hypoglycemia: never Pancreatitis: never Other: he did not tolerate pioglitizone (edema); he has never taken insulin, except in the hospital.   Interval history: no cbg record, but states cbg's are well-controlled.   Pt states few years of decreased vision from the right eye, but no assoc pain. Past Medical History  Diagnosis Date  . CORONARY ARTERY DISEASE 12/05/2006  . DIABETES MELLITUS, TYPE II 12/05/2006  . HYPERCHOLESTEROLEMIA 07/17/2007  . ANEMIA, PERNICIOUS 04/03/2007  . HYPERTENSION 12/05/2006  . VISUAL ACUITY, DECREASED, RIGHT EYE 02/19/2009  . Learning disability   . PE (pulmonary embolism)     Past Surgical History  Procedure Laterality Date  . Repair left hand    . Triple hernia repair      History   Social History  . Marital Status: Married    Spouse Name: N/A    Number of Children: N/A  . Years of Education: N/A   Occupational History  . Disabled    Social History Main Topics  . Smoking status: Former Smoker -- 0.10 packs/day for 3 years    Types: Cigarettes    Quit date: 05/15/1984  . Smokeless tobacco: Never Used  . Alcohol Use: Yes     Comment: Occassional  . Drug Use: No  . Sexual Activity: Not on file   Other Topics Concern  . Not on file   Social History Narrative   Disabled due to left wrist injury at work    Current Outpatient Prescriptions on File Prior to Visit  Medication Sig Dispense Refill  . cyanocobalamin (,VITAMIN B-12,) 1000 MCG/ML injection Inject 1,000 mcg into the muscle every 30 (thirty) days.    . diazepam (VALIUM) 5 MG tablet Take 5 mg by mouth 2 (two) times daily.     . DULoxetine (CYMBALTA) 30 MG capsule Take 30 mg by mouth daily.      Marland Kitchen EPINEPHrine (EPIPEN 2-PAK) 0.3 mg/0.3 mL IJ SOAJ injection  Inject 0.3 mLs (0.3 mg total) into the muscle once. 2 Device 5  . FLUoxetine (PROZAC) 40 MG capsule Take 40 mg by mouth daily.      Marland Kitchen losartan (COZAAR) 50 MG tablet Take 50 mg by mouth daily.    . metFORMIN (GLUCOPHAGE-XR) 500 MG 24 hr tablet Take 2 tablets (1,000 mg total) by mouth 2 (two) times daily.    Marland Kitchen pyridoxine (B-6) 100 MG tablet Take 100 mg by mouth daily.      . rivaroxaban (XARELTO) 20 MG TABS tablet Take 1 tablet (20 mg total) by mouth daily before supper. 30 tablet 5  . simvastatin (ZOCOR) 40 MG tablet TAKE 1 TABLET AT BEDTIME 30 tablet 1  . sitaGLIPtin (JANUVIA) 100 MG tablet TAKE 1 TABLET BY MOUTH EVERY DAY 30 tablet 11   No current facility-administered medications on file prior to visit.    Allergies  Allergen Reactions  . Bee Venom Anaphylaxis  . Niacin     "makes his crazy"    Family History  Problem Relation Age of Onset  . COPD Mother   . Lymphoma Father     BP 132/86 mmHg  Pulse 77  Temp(Src) 98 F (36.7 C) (Oral)  Ht 6' (1.829 m)  Wt 272 lb (123.378 kg)  BMI 36.88 kg/m2  SpO2 96%   Review of Systems Denies weight change and hypoglycemia    Objective:   Physical Exam VITAL SIGNS:  See vs page GENERAL: no distress Pulses: dorsalis pedis intact bilat.   MSK: no deformity of the feet CV: no leg edema Skin:  no ulcer on the feet.  normal color and temp on the feet. Neuro: sensation is intact to touch on the feet Ext: There is bilateral onychomycosis of the toenails.    Lab Results  Component Value Date   HGBA1C 6.5 06/19/2014       Assessment & Plan:  DM: well-controlled Vision loss, unchanged Obesity: persistent  Patient is advised the following: Patient Instructions  blood tests are being requested for you today.  We'll contact you with results.   If it is high, we can add "bromocriptine." Please come back for a "medicare wellness" appointment in 6 months (must be after 12/04/14). Please see 2 specialists: weigh loss surgery and  eye doctor.  you will receive a phone call, about days and times for appointments.

## 2014-06-19 NOTE — Patient Instructions (Addendum)
blood tests are being requested for you today.  We'll contact you with results.   If it is high, we can add "bromocriptine." Please come back for a "medicare wellness" appointment in 6 months (must be after 12/04/14). Please see 2 specialists: weigh loss surgery and eye doctor.  you will receive a phone call, about days and times for appointments.

## 2014-07-03 ENCOUNTER — Other Ambulatory Visit: Payer: Self-pay | Admitting: Endocrinology

## 2014-08-13 ENCOUNTER — Other Ambulatory Visit: Payer: Self-pay | Admitting: Endocrinology

## 2014-09-06 ENCOUNTER — Other Ambulatory Visit: Payer: Self-pay | Admitting: Endocrinology

## 2014-09-21 DIAGNOSIS — H2513 Age-related nuclear cataract, bilateral: Secondary | ICD-10-CM | POA: Diagnosis not present

## 2014-09-21 LAB — HM DIABETES EYE EXAM

## 2014-10-09 ENCOUNTER — Other Ambulatory Visit: Payer: Self-pay | Admitting: Endocrinology

## 2014-10-15 DIAGNOSIS — H2511 Age-related nuclear cataract, right eye: Secondary | ICD-10-CM | POA: Diagnosis not present

## 2014-10-15 DIAGNOSIS — H25811 Combined forms of age-related cataract, right eye: Secondary | ICD-10-CM | POA: Diagnosis not present

## 2014-12-25 ENCOUNTER — Encounter: Payer: Self-pay | Admitting: Internal Medicine

## 2014-12-25 ENCOUNTER — Ambulatory Visit (INDEPENDENT_AMBULATORY_CARE_PROVIDER_SITE_OTHER): Payer: Medicare Other | Admitting: Internal Medicine

## 2014-12-25 VITALS — BP 136/90 | HR 74 | Ht 72.0 in | Wt 273.0 lb

## 2014-12-25 DIAGNOSIS — I2699 Other pulmonary embolism without acute cor pulmonale: Secondary | ICD-10-CM | POA: Diagnosis not present

## 2014-12-25 DIAGNOSIS — E669 Obesity, unspecified: Secondary | ICD-10-CM | POA: Diagnosis not present

## 2014-12-25 DIAGNOSIS — I10 Essential (primary) hypertension: Secondary | ICD-10-CM

## 2014-12-25 NOTE — Progress Notes (Signed)
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 year old male with significant past medical history of HTN, DM, hypothyroidism presenting with PE, LE lesions. Pt states that he noticed progressive erythema and pain in LEs over the course of the past 24 hours. Has also had some generalized malaise and chills. Denies any CP or SOB. No recent change in medications. Diabetes has been stable. Reports that he took and extended trip to [place] 3-4 weeks ago.  Presented to ER hemodynamically stable. Afebrile. Satting >98% on RA. WBC 7.6, hgb 12.9, Cr 1.11, Glu 106. CT Angio shows several subsegmental RLL pulm emboli without heart strain with areas of patchy atelectasis mainly in RML. Bedside U/S performed on LEs that was preliminarily negative for DVT. Pt started prophylactically on vanc for cellulitis coverage prior to CT Angio.  Hospital Course:  Acute PE,  -was started on heparin on admission, then transitioned to Xarelto after discussion of options  -Hypercoagulable workup ordered and pending, will need to be FU as outpatient> not done   -only possible risk factor is recent extended trip to [place] 3 weeks back.  Blotchy discolored areas on both legs  -improving  -etiology unclear, suspect recent DVT that migrated to lungs  -Dopplers negative for DVT at this time.  -clinically no cellutlitis  -if worsens will need heme or vascular evaluation  -no symptoms at this time    Procedures:  Dopplers: negative for DVT   12/17/2013 1st [facility] office visit/ Wert   Chief Complaint  Patient presents with  . Pulmonary Consult    Referred by Dr. Everardo All for PE. Pt recently d/c from [facility] for PE.   doe improved over baseline. Legs nearly back to nl rec Keep toes as dry and clean as you can and if you still have cracks in the toe webs Dr Everardo All may need to send you to a dermatologist (tenactin powder) Work on weight loss and elevate legs as much as you can    06/15/2014 f/u ov/Wert re:   PE f/u @ 6 m on xarelto Chief Complaint  Patient presents with  . Follow-up    Pt denies any breathing issues at this time.   Not limited by breathing from desired activities  / legs better  rec Weight control is simply a matter of calorie balance  Continue xarelto unless there is a reasons to stop it for the next 6 months then return here to regroup    12/25/2014 f/u ov/Wert re:  Chief Complaint  Patient presents with  . Follow-up    Pt states that he is overall doing well. He has occ SOB that he relates to the humidity. No new co's today.     Body mass index is 37.02 kg/(m^2).   No more leg symptoms at all, good activity tol but still gaining wt - certainly Not limited by breathing from desired  activities    No obvious day to day or daytime variability or assoc chronic cough or cp or chest tightness, subjective wheeze or overt sinus or hb symptoms. No unusual exp hx or h/o childhood pna/ asthma or knowledge of premature birth.  Sleeping ok without nocturnal  or early am exacerbation  of respiratory  c/o's or need for noct saba. Also denies any obvious fluctuation of symptoms with weather or environmental changes or other aggravating or alleviating factors except as outlined above   Current Medications, Allergies, Complete Past Medical History, Past Surgical History, Family History, and Social History were reviewed in [employer] record.  ROS   The following are not active complaints unless bolded sore throat, dysphagia, dental problems, itching, sneezing,  nasal congestion or excess/ purulent secretions, ear ache,   fever, chills, sweats, unintended wt loss, classically pleuritic or exertional cp, hemoptysis,  orthopnea pnd or leg swelling, presyncope, palpitations, abdominal pain, anorexia, nausea, vomiting, diarrhea  or change in bowel or bladder habits, change in stools or urine, dysuria,hematuria,  rash, arthralgias, visual complaints, headache, numbness, weakness or ataxia or problems with walking or coordination,  change in mood/affect or memory.             Objective:   Physical Exam  06/15/2014          271>  12/25/2014 273   Wt Readings from Last 3 Encounters:  12/16/13 272 lb (123.378 kg)  12/03/13 273 lb (123.832 kg)  11/27/13 270 lb 4.5 oz (122.6 kg)     Obese amb wm nad   HEENT: nl dentition, turbinates, and orophanx. Nl external ear canals without cough reflex   NECK :  without JVD/Nodes/TM/ nl carotid upstrokes bilaterally   LUNGS: no acc muscle use, clear to A and P bilaterally without cough on insp or exp maneuvers   CV:  RRR  no s3 or murmur or increase in P2, no edema   ABD:  soft and nontender with nl excursion in the supine position. No bruits or organomegaly, bowel sounds nl  MS:  warm without deformities, calf tenderness, cyanosis or clubbing  SKIN: warm and dry / no redness/ calf tenderness/ swelling or any venous insuff changes at all   NEURO:  alert, approp, no deficits          Assessment & Plan:

## 2014-12-25 NOTE — Patient Instructions (Signed)
Weight control is simply a matter of calorie balance which needs to be tilted in your favor by eating less and exercising more.  To get the most out of exercise, you need to be continuously aware that you are short of breath, but never out of breath, for 30 minutes daily. As you improve, it will actually be easier for you to do the same amount of exercise  in  30 minutes so always push to the level where you are short of breath.  If this does not result in gradual weight reduction then I strongly recommend you see a nutritionist with a food diary x 2 weeks so that we can work out a negative calorie balance which is universally effective in steady weight loss programs.  Think of your calorie balance like you do your bank account where in this case you want the balance to go down so you must take in less calories than you burn up.  It's just that simple:  Hard to do, but easy to understand.  Good luck!   Continue xarelto unless there is a reasons to stop it for the next 6 months then return here to regroup     

## 2014-12-25 NOTE — Assessment & Plan Note (Signed)
Marginally Adequate control on present rx, reviewed > no change in rx needed  For now but work on wt

## 2014-12-25 NOTE — Assessment & Plan Note (Signed)
Body mass index is 37.02 kg/(m^2).  Lab Results  Component Value Date   TSH 3.570 11/26/2013     Contributing to dvt  tendency/ doe/reviewed need  achieve and maintain neg calorie balance > defer f/u primary care including intermittently monitoring thyroid status

## 2014-12-25 NOTE — Assessment & Plan Note (Signed)
See CTa  11/25/13 only subsegmental level - Echo 11/26/13 nl - Venous dopplers 11/26/13 nl  - Hypercoagulable profile not sent 11/26/13   Complete recovery from all symptoms of venous insuff/PE but still risk factor = obesity     I had an extended discussion with the patient reviewing all relevant studies completed to date and  lasting 15 to 20 minutes of a 25 minute visit     1) Discussed in detail all the  indications, usual  risks and alternatives  relative to the benefits with patient who agrees to proceed with  6 more months of xarelto  2) Each maintenance medication was reviewed in detail including most importantly the difference between maintenance and prns and under what circumstances the prns are to be triggered using an action plan format that is not reflected in the computer generated alphabetically organized AVS.    Please see instructions for details which were reviewed in writing and the patient given a copy highlighting the part that I personally wrote and discussed at today's ov.

## 2015-01-14 ENCOUNTER — Other Ambulatory Visit: Payer: Self-pay | Admitting: *Deleted

## 2015-01-14 ENCOUNTER — Other Ambulatory Visit: Payer: Self-pay | Admitting: Endocrinology

## 2015-01-15 NOTE — Telephone Encounter (Signed)
Please advise if ok to refill. Last office visit was 06/19/2013. Thanks!

## 2015-02-18 ENCOUNTER — Other Ambulatory Visit: Payer: Self-pay | Admitting: Endocrinology

## 2015-03-19 ENCOUNTER — Other Ambulatory Visit: Payer: Self-pay | Admitting: Internal Medicine

## 2015-04-22 ENCOUNTER — Other Ambulatory Visit: Payer: Self-pay | Admitting: Endocrinology

## 2015-04-22 NOTE — Telephone Encounter (Signed)
Please advise if ok to refill last office visit was 2.5.16. Thanks!

## 2015-04-22 NOTE — Telephone Encounter (Signed)
Please refill x 1 Ov due 

## 2015-04-22 NOTE — Telephone Encounter (Signed)
Rx submitted per pt's request. Appointment letter mailed

## 2015-05-16 HISTORY — PX: CATARACT EXTRACTION W/ INTRAOCULAR LENS IMPLANT: SHX1309

## 2015-05-21 DIAGNOSIS — Z961 Presence of intraocular lens: Secondary | ICD-10-CM | POA: Diagnosis not present

## 2015-06-03 ENCOUNTER — Ambulatory Visit (INDEPENDENT_AMBULATORY_CARE_PROVIDER_SITE_OTHER): Payer: Medicare Other | Admitting: Endocrinology

## 2015-06-03 ENCOUNTER — Other Ambulatory Visit: Payer: Self-pay | Admitting: *Deleted

## 2015-06-03 ENCOUNTER — Telehealth: Payer: Self-pay | Admitting: Endocrinology

## 2015-06-03 ENCOUNTER — Encounter: Payer: Self-pay | Admitting: Endocrinology

## 2015-06-03 VITALS — BP 122/78 | HR 78 | Temp 98.1°F | Resp 12 | Wt 273.6 lb

## 2015-06-03 DIAGNOSIS — E7800 Pure hypercholesterolemia, unspecified: Secondary | ICD-10-CM | POA: Diagnosis not present

## 2015-06-03 DIAGNOSIS — I2699 Other pulmonary embolism without acute cor pulmonale: Secondary | ICD-10-CM

## 2015-06-03 DIAGNOSIS — I10 Essential (primary) hypertension: Secondary | ICD-10-CM | POA: Diagnosis not present

## 2015-06-03 DIAGNOSIS — I25110 Atherosclerotic heart disease of native coronary artery with unstable angina pectoris: Secondary | ICD-10-CM

## 2015-06-03 DIAGNOSIS — Z125 Encounter for screening for malignant neoplasm of prostate: Secondary | ICD-10-CM

## 2015-06-03 DIAGNOSIS — E119 Type 2 diabetes mellitus without complications: Secondary | ICD-10-CM

## 2015-06-03 DIAGNOSIS — I2510 Atherosclerotic heart disease of native coronary artery without angina pectoris: Secondary | ICD-10-CM

## 2015-06-03 LAB — CBC WITH DIFFERENTIAL/PLATELET
Basophils Absolute: 0.1 10*3/uL (ref 0.0–0.1)
Basophils Relative: 0.9 % (ref 0.0–3.0)
Eosinophils Absolute: 0.2 10*3/uL (ref 0.0–0.7)
Eosinophils Relative: 2.5 % (ref 0.0–5.0)
HCT: 45.7 % (ref 39.0–52.0)
Hemoglobin: 15.3 g/dL (ref 13.0–17.0)
Lymphocytes Relative: 27.9 % (ref 12.0–46.0)
Lymphs Abs: 2 10*3/uL (ref 0.7–4.0)
MCHC: 33.6 g/dL (ref 30.0–36.0)
MCV: 90.8 fl (ref 78.0–100.0)
Monocytes Absolute: 0.5 10*3/uL (ref 0.1–1.0)
Monocytes Relative: 7.3 % (ref 3.0–12.0)
Neutro Abs: 4.4 10*3/uL (ref 1.4–7.7)
Neutrophils Relative %: 61.4 % (ref 43.0–77.0)
Platelets: 417 10*3/uL — ABNORMAL HIGH (ref 150.0–400.0)
RBC: 5.03 Mil/uL (ref 4.22–5.81)
RDW: 12.8 % (ref 11.5–15.5)
WBC: 7.1 10*3/uL (ref 4.0–10.5)

## 2015-06-03 LAB — URINALYSIS, ROUTINE W REFLEX MICROSCOPIC
Bilirubin Urine: NEGATIVE
Hgb urine dipstick: NEGATIVE
Ketones, ur: NEGATIVE
Leukocytes, UA: NEGATIVE
Nitrite: NEGATIVE
RBC / HPF: NONE SEEN (ref 0–?)
Specific Gravity, Urine: >=1.03 — AB (ref 1.000–1.030)
Total Protein, Urine: NEGATIVE
Urine Glucose: NEGATIVE
Urobilinogen, UA: 0.2 (ref 0.0–1.0)
WBC, UA: NONE SEEN (ref 0–?)
pH: 5.5 (ref 5.0–8.0)

## 2015-06-03 LAB — LDL CHOLESTEROL, DIRECT: Direct LDL: 143 mg/dL

## 2015-06-03 LAB — HEPATIC FUNCTION PANEL
ALT: 34 U/L (ref 0–53)
AST: 21 U/L (ref 0–37)
Albumin: 4.3 g/dL (ref 3.5–5.2)
Alkaline Phosphatase: 65 U/L (ref 39–117)
Bilirubin, Direct: 0.1 mg/dL (ref 0.0–0.3)
Total Bilirubin: 0.5 mg/dL (ref 0.2–1.2)
Total Protein: 7.6 g/dL (ref 6.0–8.3)

## 2015-06-03 LAB — BASIC METABOLIC PANEL
BUN: 16 mg/dL (ref 6–23)
CO2: 30 mEq/L (ref 19–32)
Calcium: 10.1 mg/dL (ref 8.4–10.5)
Chloride: 99 mEq/L (ref 96–112)
Creatinine, Ser: 1.01 mg/dL (ref 0.40–1.50)
GFR: 83.47 mL/min (ref >60.00)
Glucose, Bld: 132 mg/dL — ABNORMAL HIGH (ref 70–99)
Potassium: 4.6 mEq/L (ref 3.5–5.1)
Sodium: 137 mEq/L (ref 135–145)

## 2015-06-03 LAB — LIPID PANEL
Cholesterol: 222 mg/dL — ABNORMAL HIGH (ref 0–200)
HDL: 50 mg/dL (ref >39.00)
NonHDL: 171.63
Total CHOL/HDL Ratio: 4
Triglycerides: 208 mg/dL — ABNORMAL HIGH (ref 0.0–149.0)
VLDL: 41.6 mg/dL — ABNORMAL HIGH (ref 0.0–40.0)

## 2015-06-03 LAB — MICROALBUMIN / CREATININE URINE RATIO
Creatinine,U: 181.4 mg/dL
Microalb Creat Ratio: 3.3 mg/g (ref 0.0–30.0)
Microalb, Ur: 6 mg/dL — ABNORMAL HIGH (ref 0.0–1.9)

## 2015-06-03 LAB — HEMOGLOBIN A1C: Hgb A1c MFr Bld: 6.7 % — ABNORMAL HIGH (ref 4.6–6.5)

## 2015-06-03 LAB — TSH: TSH: 1.62 u[IU]/mL (ref 0.35–4.50)

## 2015-06-03 MED ORDER — SIMVASTATIN 40 MG PO TABS
40.0000 mg | ORAL_TABLET | Freq: Every day | ORAL | Status: DC
Start: 2015-06-03 — End: 2016-06-18

## 2015-06-03 MED ORDER — EPINEPHRINE 0.3 MG/0.3ML IJ SOAJ: 0.3000 mg | Freq: Once | INTRAMUSCULAR | Status: DC

## 2015-06-03 MED ORDER — GLUCOSE BLOOD VI STRP: 1.0000 | ORAL_STRIP | Freq: Every day | Status: DC

## 2015-06-03 NOTE — Telephone Encounter (Signed)
Patient stated [facility] would like to know what strips patient need,

## 2015-06-03 NOTE — Telephone Encounter (Signed)
Patient called stating that his pharmacy needs a Dx code for his test strips   Pharmacy:  [facility] [address]    Thank you

## 2015-06-03 NOTE — Progress Notes (Signed)
we discussed code status.  pt requests full code, but would not want to be started or maintained on artificial life-support measures if there was not a reasonable chance of recovery 

## 2015-06-03 NOTE — Progress Notes (Signed)
Subjective:    Patient ID: Xavier [name], male    DOB: 12/23/1965, 50 y.o.   MRN: [id number]  HPI Pt returns for f/u of diabetes mellitus: DM type: 2 Dx'ed: 2005 Complications: CAD and polyneuropathy Therapy: 2 oral meds DKA: never Severe hypoglycemia: never Pancreatitis: never Other: he did not tolerate pioglitizone (edema); he has never taken insulin, except in the hospital.   Interval history: no cbg record, but states cbg's are well-controlled.  Denies weight change CAD: denies chest pain.  Denies SOB. Past Medical History  Diagnosis Date  . CORONARY ARTERY DISEASE 12/05/2006  . DIABETES MELLITUS, TYPE II 12/05/2006  . HYPERCHOLESTEROLEMIA 07/17/2007  . ANEMIA, PERNICIOUS 04/03/2007  . HYPERTENSION 12/05/2006  . VISUAL ACUITY, DECREASED, RIGHT EYE 02/19/2009  . Learning disability   . PE (pulmonary embolism)     Past Surgical History  Procedure Laterality Date  . Repair left hand    . Triple hernia repair      Social History   Social History  . Marital Status: Married    Spouse Name: N/A  . Number of Children: N/A  . Years of Education: N/A   Occupational History  . Disabled    Social History Main Topics  . Smoking status: Former Smoker -- 0.10 packs/day for 3 years    Types: Cigarettes    Quit date: 05/15/1984  . Smokeless tobacco: Never Used  . Alcohol Use: Yes     Comment: Occassional  . Drug Use: No  . Sexual Activity: Not on file   Other Topics Concern  . Not on file   Social History Narrative   Disabled due to left wrist injury at work    Current Outpatient Prescriptions on File Prior to Visit  Medication Sig Dispense Refill  . cyanocobalamin (,VITAMIN B-12,) 1000 MCG/ML injection Inject 1,000 mcg into the muscle every 30 (thirty) days.    . diazepam (VALIUM) 5 MG tablet Take 5 mg by mouth 2 (two) times daily.     . DULoxetine (CYMBALTA) 30 MG capsule Take 30 mg by mouth daily.      Marland Kitchen FLUoxetine (PROZAC) 40 MG capsule Take 40 mg by mouth daily.       Marland Kitchen JANUVIA 100 MG tablet TAKE 1 TABLET BY MOUTH EVERY DAY 30 tablet 9  . losartan (COZAAR) 50 MG tablet Take 50 mg by mouth daily.    . metFORMIN (GLUCOPHAGE-XR) 500 MG 24 hr tablet TAKE 2 TABLETS BY MOUTH TWICE A DAY 120 tablet 2  . pyridoxine (B-6) 100 MG tablet Take 100 mg by mouth daily.      Carlena Hurl 20 MG TABS tablet TAKE 1 TABLET BY MOUTH EVERY DAY BEFORE SUPPER 30 tablet 5   No current facility-administered medications on file prior to visit.    Allergies  Allergen Reactions  . Bee Venom Anaphylaxis  . Niacin     "makes his crazy"    Family History  Problem Relation Age of Onset  . COPD Mother   . Lymphoma Father     BP 122/78 mmHg  Pulse 78  Temp(Src) 98.1 F (36.7 C) (Oral)  Resp 12  Wt 273 lb 9.6 oz (124.104 kg)  SpO2 96%    Review of Systems He denies hypoglycemia.  He has mild numbness of the feet.     Objective:   Physical Exam VITAL SIGNS:  See vs page GENERAL: no distress NECK: There is no palpable thyroid enlargement.  No thyroid nodule is palpable.  No palpable  lymphadenopathy at the anterior neck. LUNGS:  Clear to auscultation HEART:  Regular rate and rhythm without murmurs noted. Normal S1,S2.   Pulses: dorsalis pedis intact bilat.   MSK: no deformity of the feet CV: no leg edema Skin:  no ulcer on the feet.  normal color and temp on the feet. Neuro: sensation is intact to touch on the feet   i personally reviewed electrocardiogram tracing (today): Indication: CAD Impression: nonspecific changes  Lab Results  Component Value Date   WBC 7.1 06/03/2015   HGB 15.3 06/03/2015   HCT 45.7 06/03/2015   PLT 417.0* 06/03/2015   GLUCOSE 132* 06/03/2015   CHOL 222* 06/03/2015   TRIG 208.0* 06/03/2015   HDL 50.00 06/03/2015   LDLDIRECT 143.0 06/03/2015   LDLCALC * 06/11/2010    120        Total Cholesterol/HDL:CHD Risk Coronary Heart Disease Risk Table                     Men   Women  1/2 Average Risk   3.4   3.3  Average Risk        5.0   4.4  2 X Average Risk   9.6   7.1  3 X Average Risk  23.4   11.0        Use the calculated Patient Ratio above and the CHD Risk Table to determine the patient's CHD Risk.        ATP III CLASSIFICATION (LDL):  <100     mg/dL   Optimal  161-096  mg/dL   Near or Above                    Optimal  130-159  mg/dL   Borderline  045-409  mg/dL   High  >811     mg/dL   Very High   ALT 34 91/47/8295   AST 21 06/03/2015   NA 137 06/03/2015   K 4.6 06/03/2015   CL 99 06/03/2015   CREATININE 1.01 06/03/2015   BUN 16 06/03/2015   CO2 30 06/03/2015   TSH 1.62 06/03/2015   PSA 0.21 09/24/2012   INR 0.94 11/25/2013   HGBA1C 6.7* 06/03/2015   MICROALBUR 6.0* 06/03/2015      Assessment & Plan:  CAD: due for recheck.  Check treadmill.  Dyslipidemia: not well-controlled, usually due to noncompliance.  I refilled zocor. DM: well-controlled: same medication.    Subjective:   Patient here for Medicare annual wellness visit and management of other chronic and acute problems.     Risk factors: multiple med probs  Copy Providing Medical Care to Patient:  See "snapshot"   Activities of Daily Living: In your present state of health, do you have any difficulty performing the following activities?:  Preparing food and eating?: No  Bathing yourself: No  Getting dressed: No  Using the toilet:No  Moving around from place to place: No  In the past year have you fallen or had a near fall?: No    Home Safety: Has smoke detector and wears seat belts. Firearms are locked up. No excess sun exposure.  Diet and Exercise  Current exercise habits: pt says good Dietary issues discussed: pt reports a healthy diet   Depression Screen  Q1: Over the past two weeks, have you felt down, depressed or hopeless? Pt says well-controlled.   Q2: Over the past two weeks, have you felt little interest or pleasure in doing things? no  The following portions of the patient's history were  reviewed and updated as appropriate: allergies, current medications, past family history, past medical history, past social history, past surgical history and problem list.   Review of Systems  Denies hearing loss, and visual loss Objective:   Vision:  Curator, but does not recall name Hearing: grossly normal Body mass index:  See vs page. Msk: pt easily and quickly performs "get-up-and-go" from a sitting position Cognitive Impairment Assessment: cognition, memory and judgment appear normal.  remembers 3/3 at 5 minutes.  excellent recall.  can easily read and write a sentence.  alert and oriented x 3.   Assessment:   Medicare wellness utd on preventive parameters    Plan:   During the course of the visit the patient was educated and counseled about appropriate screening and preventive services including:        Fall prevention   Diabetes screening  Nutrition counseling   Vaccines / LABS Zostavax / Pneumococcal Vaccine  today  Patient Instructions (the written plan) was given to the patient:  good diet and exercise significantly improve the control of your diabetes.  please let me know if you wish to be referred to a dietician.  high blood sugar is very risky to your health.  you should see an eye doctor and dentist every year.  It is very important to get all recommended vaccinations.  please consider these measures for your health:  minimize alcohol.  do not use tobacco products.  have a colonoscopy at least every 10 years from age 61.  Women should have an annual mammogram from age 53.  keep firearms safely stored.  always use seat belts.  have working smoke alarms in your home.  see an eye doctor and dentist regularly.  never drive under the influence of alcohol or drugs (including prescription drugs).  those with fair skin should take precautions against the sun. it is critically important to prevent falling down (keep floor areas well-lit, dry, and free of loose  objects.  If you have a cane, walker, or wheelchair, you should use it, even for short trips around the house.  Also, try not to rush).

## 2015-06-03 NOTE — Telephone Encounter (Signed)
Please read message below and advise.  

## 2015-06-03 NOTE — Patient Instructions (Addendum)
good diet and exercise significantly improve the control of your diabetes.  please let me know if you wish to be referred to a dietician.  high blood sugar is very risky to your health.  you should see an eye doctor and dentist every year.  It is very important to get all recommended vaccinations.  please consider these measures for your health:  minimize alcohol.  do not use tobacco products.  have a colonoscopy at least every 10 years from age 49.  Women should have an annual mammogram from age 5.  keep firearms safely stored.  always use seat belts.  have working smoke alarms in your home.  see an eye doctor and dentist regularly.  never drive under the influence of alcohol or drugs (including prescription drugs).  those with fair skin should take precautions against the sun. it is critically important to prevent falling down (keep floor areas well-lit, dry, and free of loose objects.  If you have a cane, walker, or wheelchair, you should use it, even for short trips around the house.  Also, try not to rush).   blood tests are requested for you today.  We'll let you know about the results. Let's do a treadmill test.  you will receive a phone call, about a day and time for an appointment Please come back for a follow-up appointment in 6 months.

## 2015-06-04 MED ORDER — GLUCOSE BLOOD VI STRP: 1.0000 | ORAL_STRIP | Freq: Every day | Status: DC

## 2015-06-04 NOTE — Telephone Encounter (Signed)
Rx for one touch verio sent electronically.

## 2015-06-04 NOTE — Telephone Encounter (Signed)
Rx submitted

## 2015-06-14 IMAGING — CT CT ANGIO CHEST
2 of 9 series · 18 of 46 positions shown · IV contrast (Omni 300)
Comparison: Chest radiograph November 25, 2013 and chest CT October 12, 2005

CLINICAL DATA: Difficulty breathing and chest pain

EXAM:
CT ANGIOGRAPHY CHEST WITH CONTRAST
TECHNIQUE: Multidetector CT imaging of the chest was performed using the
standard protocol during bolus administration of intravenous
contrast. Multiplanar CT image reconstructions and MIPs were
obtained to evaluate the vascular anatomy.
CONTRAST:  80mL OMNIPAQUE IOHEXOL 350 MG/ML SOLN

[Series 5: thins · axial · 0.73mm/px · z∈[+1261,+1489]mm · 15 of 258 slices shown]
[im 15/258  lung]
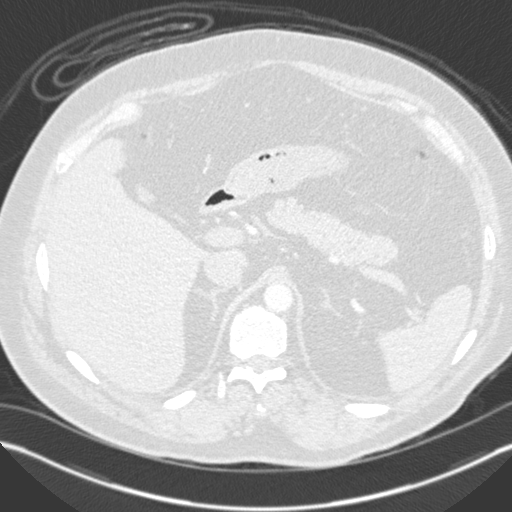
[im 29/258  soft-tissue]
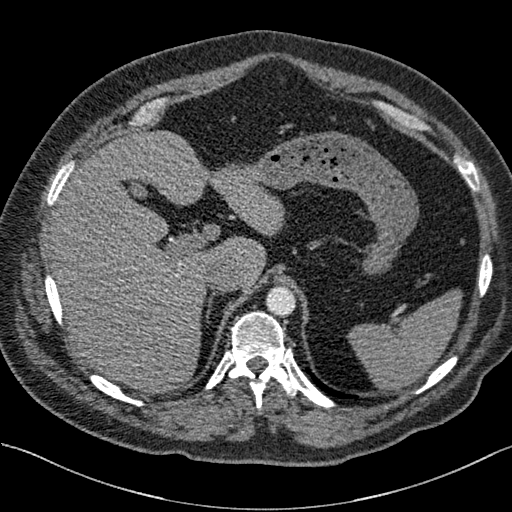
[im 43/258  lung]
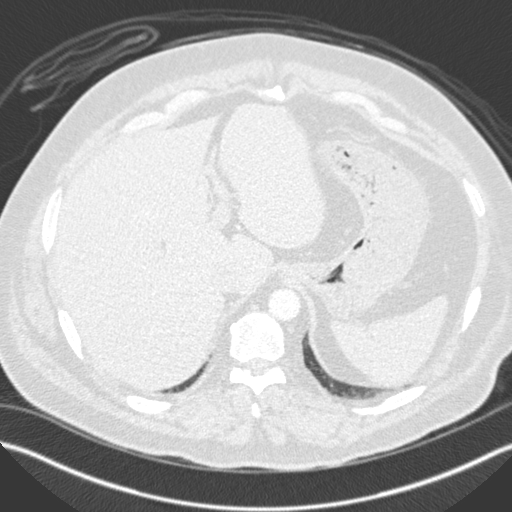
[im 58/258  soft-tissue]
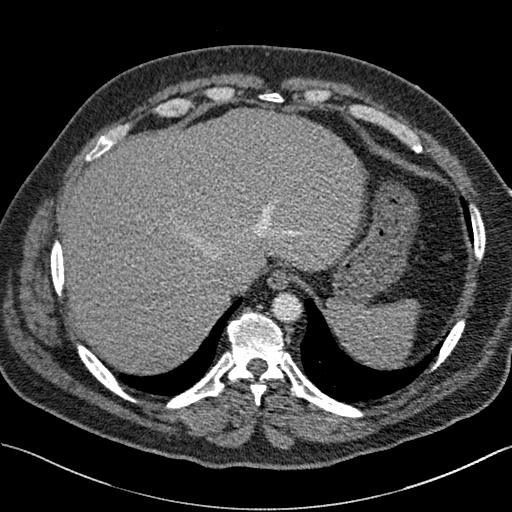
[im 86/258  lung]
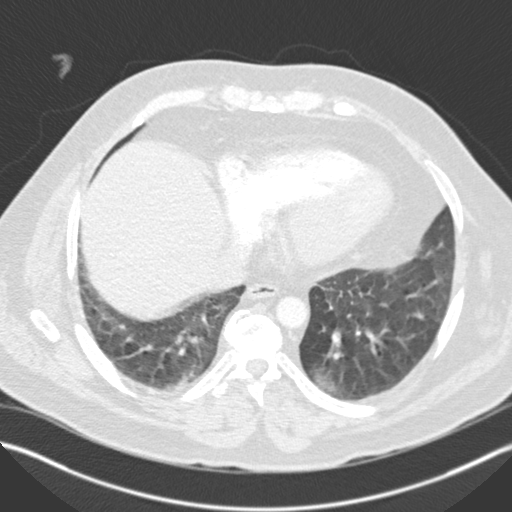
[im 100/258  soft-tissue]
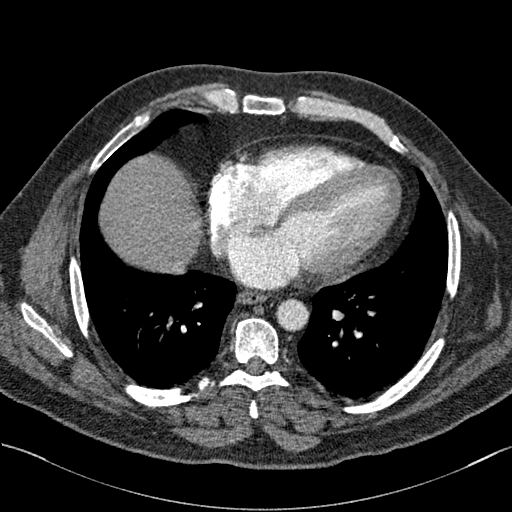
[im 115/258  lung]
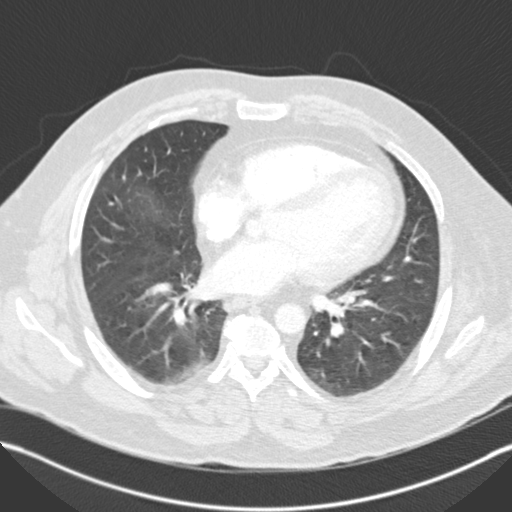
[im 129/258  soft-tissue]
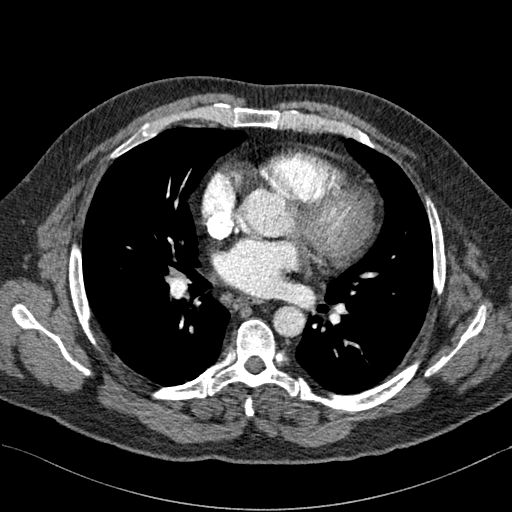
[im 143/258  lung]
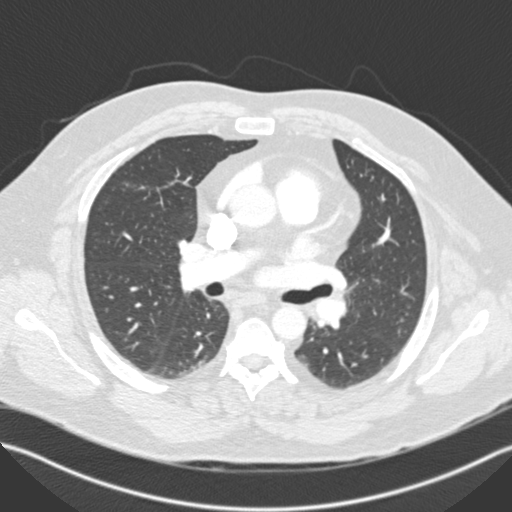
[im 158/258  soft-tissue]
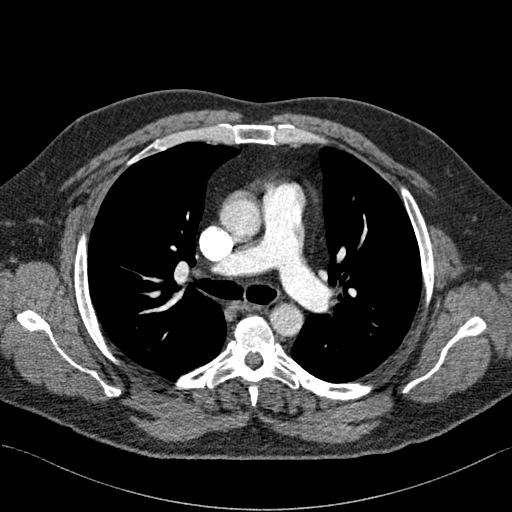
[im 172/258  lung]
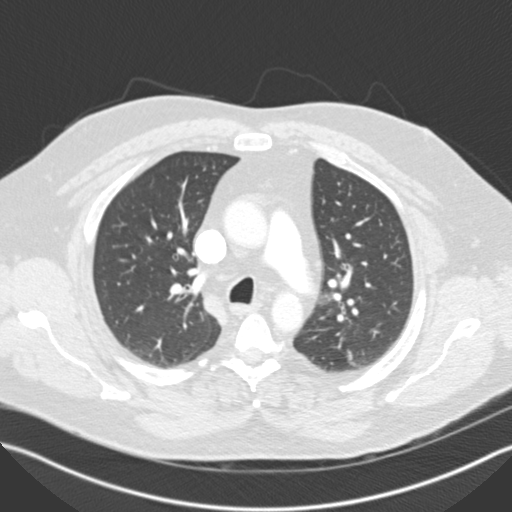
[im 200/258  soft-tissue]
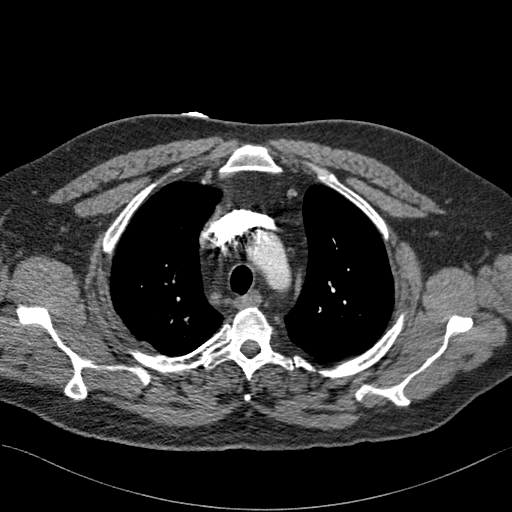
[im 215/258  lung]
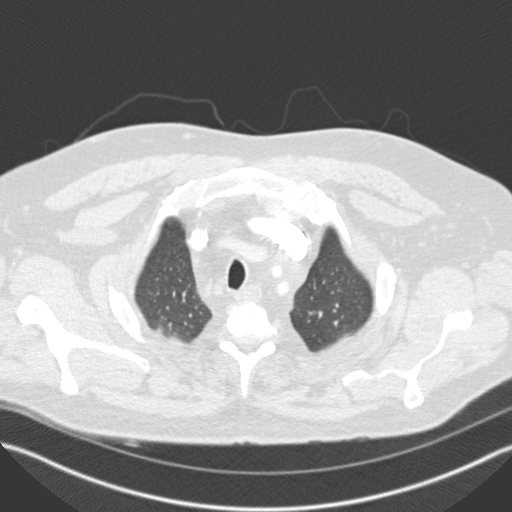
[im 229/258  soft-tissue]
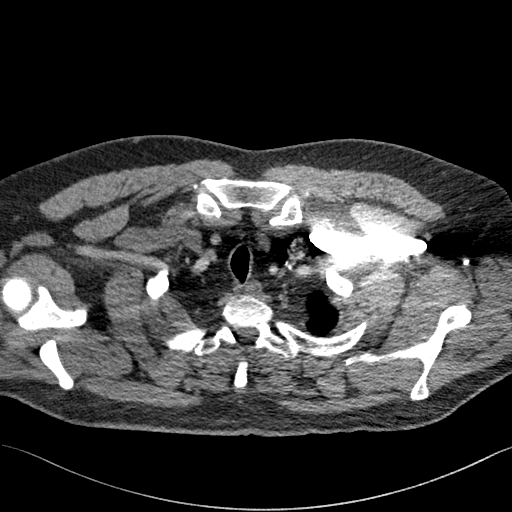
[im 243/258  lung]
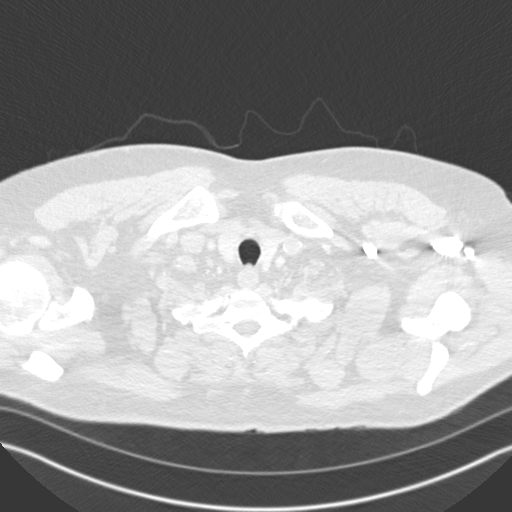

[Series 7: coronal mpr · coronal · 0.52mm/px · 3 of 143 slices shown]
[im 36/143  soft-tissue]
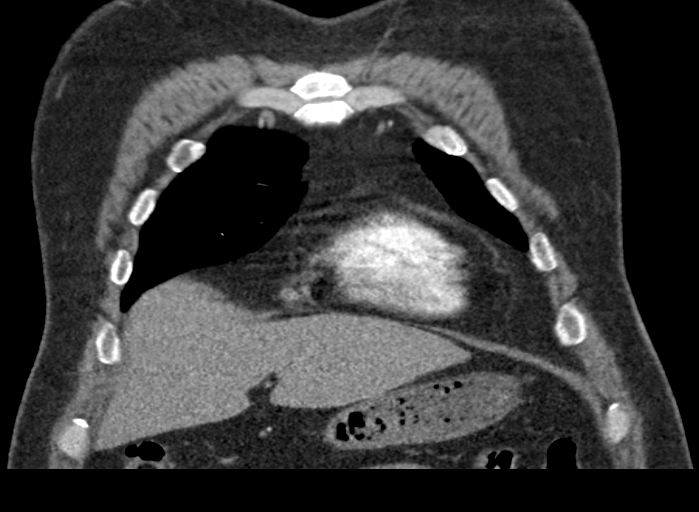
[im 72/143  soft-tissue]
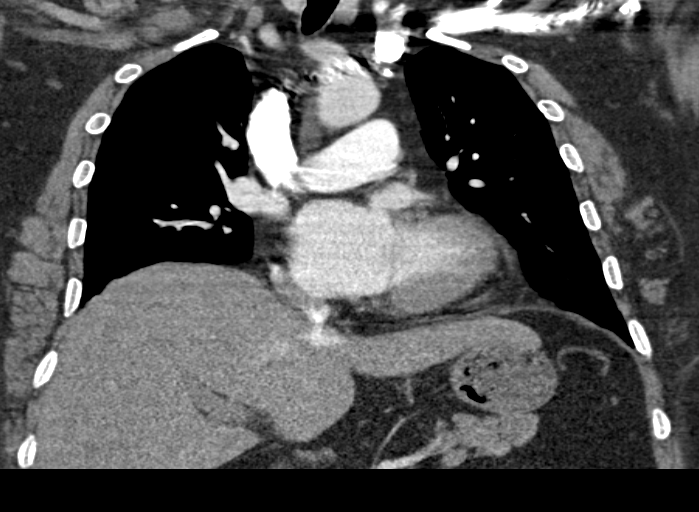
[im 107/143  soft-tissue]
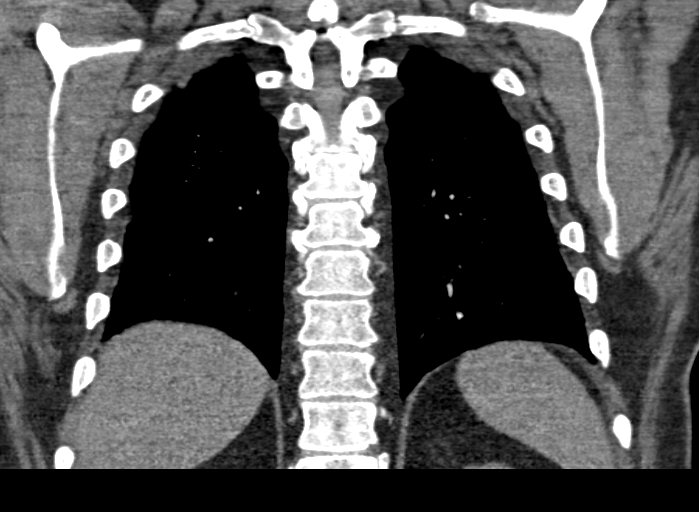

[18 of 46 positions shown; findings below may reference images not displayed]

FINDINGS: There are several small subsegmental right lower lobe pulmonary
emboli. No larger pulmonary emboli are appreciable. There is no
evidence suggesting right heart strain. There is no thoracic aortic
aneurysm or dissection.

There is patchy atelectasis in the right middle lobe and in both
lung bases posteriorly. Lungs elsewhere are clear. There is moderate
mediastinal fat. There is no thoracic adenopathy. The pericardium is
not appreciably thickened.

In the visualized upper abdomen, there is fatty change in the liver.
There are no appreciable blastic or lytic bone lesions. Thyroid
appears normal.

Review of the MIP images confirms the above findings.
IMPRESSION: Several subsegmental right lower lobe pulmonary emboli. No larger
pulmonary emboli. No demonstrable right heart strain. Areas of
patchy atelectasis, mainly in the right middle lobe. Lungs elsewhere
clear. Fatty liver present.

Critical Value/emergent results were called by telephone at the time
of interpretation on 11/25/2013 at [DATE] to NGENDAMBIZI BINTI HASANI,
PA , who verbally acknowledged these results.

## 2015-06-23 ENCOUNTER — Encounter: Payer: Self-pay | Admitting: Internal Medicine

## 2015-06-23 ENCOUNTER — Ambulatory Visit (INDEPENDENT_AMBULATORY_CARE_PROVIDER_SITE_OTHER): Payer: Medicare Other

## 2015-06-23 ENCOUNTER — Encounter: Payer: Medicare Other | Admitting: Physician Assistant

## 2015-06-23 ENCOUNTER — Telehealth: Payer: Self-pay | Admitting: Endocrinology

## 2015-06-23 ENCOUNTER — Ambulatory Visit: Payer: Medicare Other | Admitting: Internal Medicine

## 2015-06-23 ENCOUNTER — Ambulatory Visit (INDEPENDENT_AMBULATORY_CARE_PROVIDER_SITE_OTHER): Payer: Medicare Other | Admitting: Internal Medicine

## 2015-06-23 ENCOUNTER — Encounter: Payer: Self-pay | Admitting: Endocrinology

## 2015-06-23 ENCOUNTER — Other Ambulatory Visit (INDEPENDENT_AMBULATORY_CARE_PROVIDER_SITE_OTHER): Payer: Medicare Other

## 2015-06-23 ENCOUNTER — Ambulatory Visit (INDEPENDENT_AMBULATORY_CARE_PROVIDER_SITE_OTHER): Payer: Medicare Other | Admitting: Endocrinology

## 2015-06-23 VITALS — BP 152/96 | HR 87 | Temp 98.1°F | Ht 72.0 in | Wt 272.0 lb

## 2015-06-23 VITALS — BP 142/94 | HR 91 | Ht 72.0 in | Wt 273.0 lb

## 2015-06-23 DIAGNOSIS — E7800 Pure hypercholesterolemia, unspecified: Secondary | ICD-10-CM | POA: Diagnosis not present

## 2015-06-23 DIAGNOSIS — Z8489 Family history of other specified conditions: Secondary | ICD-10-CM

## 2015-06-23 DIAGNOSIS — G4710 Hypersomnia, unspecified: Secondary | ICD-10-CM | POA: Insufficient documentation

## 2015-06-23 DIAGNOSIS — E119 Type 2 diabetes mellitus without complications: Secondary | ICD-10-CM

## 2015-06-23 DIAGNOSIS — Z23 Encounter for immunization: Secondary | ICD-10-CM | POA: Diagnosis not present

## 2015-06-23 DIAGNOSIS — I2699 Other pulmonary embolism without acute cor pulmonale: Secondary | ICD-10-CM

## 2015-06-23 DIAGNOSIS — Z125 Encounter for screening for malignant neoplasm of prostate: Secondary | ICD-10-CM | POA: Diagnosis not present

## 2015-06-23 DIAGNOSIS — D510 Vitamin B12 deficiency anemia due to intrinsic factor deficiency: Secondary | ICD-10-CM

## 2015-06-23 DIAGNOSIS — R252 Cramp and spasm: Secondary | ICD-10-CM | POA: Insufficient documentation

## 2015-06-23 DIAGNOSIS — Z86718 Personal history of other venous thrombosis and embolism: Secondary | ICD-10-CM

## 2015-06-23 DIAGNOSIS — G2581 Restless legs syndrome: Secondary | ICD-10-CM

## 2015-06-23 DIAGNOSIS — Z87898 Personal history of other specified conditions: Secondary | ICD-10-CM

## 2015-06-23 DIAGNOSIS — I10 Essential (primary) hypertension: Secondary | ICD-10-CM

## 2015-06-23 DIAGNOSIS — I2510 Atherosclerotic heart disease of native coronary artery without angina pectoris: Secondary | ICD-10-CM

## 2015-06-23 DIAGNOSIS — I25110 Atherosclerotic heart disease of native coronary artery with unstable angina pectoris: Secondary | ICD-10-CM

## 2015-06-23 DIAGNOSIS — L723 Sebaceous cyst: Secondary | ICD-10-CM | POA: Diagnosis not present

## 2015-06-23 LAB — EXERCISE TOLERANCE TEST
Estimated workload: 7.7 METS
Exercise duration (min): 6 min
Exercise duration (sec): 29 s
MPHR: 172 {beats}/min
Peak HR: 151 {beats}/min
Percent HR: 88 %
RPE: 15
Rest HR: 78 {beats}/min

## 2015-06-23 LAB — PHOSPHORUS: Phosphorus: 4 mg/dL (ref 2.3–4.6)

## 2015-06-23 LAB — CBC
HCT: 43.6 % (ref 39.0–52.0)
Hemoglobin: 14.8 g/dL (ref 13.0–17.0)
MCHC: 34 g/dL (ref 30.0–36.0)
MCV: 90.8 fl (ref 78.0–100.0)
Platelets: 414 10*3/uL — ABNORMAL HIGH (ref 150.0–400.0)
RBC: 4.8 Mil/uL (ref 4.22–5.81)
RDW: 13 % (ref 11.5–15.5)
WBC: 8.8 10*3/uL (ref 4.0–10.5)

## 2015-06-23 LAB — BASIC METABOLIC PANEL
BUN: 17 mg/dL (ref 6–23)
CO2: 29 mEq/L (ref 19–32)
Calcium: 9.7 mg/dL (ref 8.4–10.5)
Chloride: 99 mEq/L (ref 96–112)
Creatinine, Ser: 1.04 mg/dL (ref 0.40–1.50)
GFR: 80.68 mL/min (ref >60.00)
Glucose, Bld: 114 mg/dL — ABNORMAL HIGH (ref 70–99)
Potassium: 4.4 mEq/L (ref 3.5–5.1)
Sodium: 136 mEq/L (ref 135–145)

## 2015-06-23 MED ORDER — LOSARTAN POTASSIUM-HCTZ 50-12.5 MG PO TABS: 1.0000 | ORAL_TABLET | Freq: Every day | ORAL | Status: DC

## 2015-06-23 MED ORDER — GLUCOSE BLOOD VI STRP: 1.0000 | ORAL_STRIP | Freq: Every day | Status: DC

## 2015-06-23 MED ORDER — CYANOCOBALAMIN 1000 MCG/ML IJ SOLN
1000.0000 ug | Freq: Once | INTRAMUSCULAR | Status: AC
  Administered 2015-06-23: 1000 ug via INTRAMUSCULAR

## 2015-06-23 NOTE — Telephone Encounter (Signed)
See note below and please advise, Thanks! 

## 2015-06-23 NOTE — Patient Instructions (Addendum)
i have sent a prescription to your pharmacy, to increase the blood pressure pill. Please come back in 2 weeks for a blood pressure check.  For the next week or so, please be careful washing and combing the wound area. It may drip a few more drops of blood. Please come back if it is more than that, or if you have any other question.

## 2015-06-23 NOTE — Patient Instructions (Signed)
ICD-9-CM ICD-10-CM   1. Cramp of both lower extremities 729.82 R25.2   2. Personal history of DVT (deep vein thrombosis) V12.51 Z86.718   3. History of snoring V15.89 Z84.89   4. Excessive somnolence disorder 780.54 G47.10   5. Other acute pulmonary embolism (HCC)  I26.99     Do d-dimer , ferritin, bmet, mag, phos and cbc blood work Do Ultrasound Lower extremity - eval for DVT Refer Sleep doc Dr DeDios or others in our office  Followup  - will call with results  = see sleep doc  - back to Dr Sherene Sires in 2 months or so

## 2015-06-23 NOTE — Telephone Encounter (Signed)
Patient wife called and stated that her husband had a stress test today, his b/p was so high that they had to stop the test his b/p was 251/156. She wants hi to come in today for an appt, please advise

## 2015-06-23 NOTE — Progress Notes (Signed)
Subjective:    Patient ID: Xavier [name], male    DOB: 07-Aug-1965, 50 y.o.   MRN: [id number]  HPI Pt was noted this am to have severe HTN response to exercise.  He has slight doe sensation in the chest, but no assoc pain.   Past Medical History  Diagnosis Date  . CORONARY ARTERY DISEASE 12/05/2006  . DIABETES MELLITUS, TYPE II 12/05/2006  . HYPERCHOLESTEROLEMIA 07/17/2007  . ANEMIA, PERNICIOUS 04/03/2007  . HYPERTENSION 12/05/2006  . VISUAL ACUITY, DECREASED, RIGHT EYE 02/19/2009  . Learning disability   . PE (pulmonary embolism)     Past Surgical History  Procedure Laterality Date  . Repair left hand    . Triple hernia repair      Social History   Social History  . Marital Status: Married    Spouse Name: N/A  . Number of Children: N/A  . Years of Education: N/A   Occupational History  . Disabled    Social History Main Topics  . Smoking status: Former Smoker -- 0.10 packs/day for 3 years    Types: Cigarettes    Quit date: 05/15/1984  . Smokeless tobacco: Never Used  . Alcohol Use: Yes     Comment: Occassional  . Drug Use: No  . Sexual Activity: Not on file   Other Topics Concern  . Not on file   Social History Narrative   Disabled due to left wrist injury at work    Current Outpatient Prescriptions on File Prior to Visit  Medication Sig Dispense Refill  . cyanocobalamin (,VITAMIN B-12,) 1000 MCG/ML injection Inject 1,000 mcg into the muscle every 30 (thirty) days. Reported on 06/23/2015    . diazepam (VALIUM) 5 MG tablet Take 5 mg by mouth 2 (two) times daily.     . DULoxetine (CYMBALTA) 30 MG capsule Take 30 mg by mouth daily.      Marland Kitchen EPINEPHrine (EPIPEN 2-PAK) 0.3 mg/0.3 mL IJ SOAJ injection Inject 0.3 mLs (0.3 mg total) into the muscle once. 2 Device 5  . FLUoxetine (PROZAC) 40 MG capsule Take 40 mg by mouth daily.      Marland Kitchen JANUVIA 100 MG tablet TAKE 1 TABLET BY MOUTH EVERY DAY 30 tablet 9  . metFORMIN (GLUCOPHAGE-XR) 500 MG 24 hr tablet TAKE 2 TABLETS BY MOUTH  TWICE A DAY 120 tablet 2  . pyridoxine (B-6) 100 MG tablet Take 100 mg by mouth daily.      . simvastatin (ZOCOR) 40 MG tablet Take 1 tablet (40 mg total) by mouth at bedtime. 30 tablet 11  . XARELTO 20 MG TABS tablet TAKE 1 TABLET BY MOUTH EVERY DAY BEFORE SUPPER 30 tablet 5   No current facility-administered medications on file prior to visit.    Allergies  Allergen Reactions  . Bee Venom Anaphylaxis  . Niacin     "makes his crazy"    Family History  Problem Relation Age of Onset  . COPD Mother   . Lymphoma Father     BP 152/96 mmHg  Pulse 87  Temp(Src) 98.1 F (36.7 C) (Oral)  Ht 6' (1.829 m)  Wt 272 lb (123.378 kg)  BMI 36.88 kg/m2  SpO2 97%  Review of Systems He has edema, but no weight change.  He has a lump in his head    Objective:   Physical Exam VITAL SIGNS:  See vs page GENERAL: no distress Head: 2 cm sebaceous cyst on the top of the head LUNGS:  Clear to auscultation HEART:  Regular rate and rhythm without murmurs noted. Normal S1,S2.   Ext: 1+ bilat leg edema  Procedure: I and D of sebaceous cyst consent obtained, signed form on chart The area is first sprayed with cooling agent local: xylocaine 2%, with epinephrine prep: alcohol pad I and D done with #11 blade. 1 cc sebaceous material is expressed. EBL=4 drops no complications     Assessment & Plan:  Sebaceous cyst, new, s/p I and D HTN: worse for uncertain reason.   Patient is advised the following: Patient Instructions  i have sent a prescription to your pharmacy, to increase the blood pressure pill. Please come back in 2 weeks for a blood pressure check.  For the next week or so, please be careful washing and combing the wound area. It may drip a few more drops of blood. Please come back if it is more than that, or if you have any other question.

## 2015-06-23 NOTE — Progress Notes (Signed)
Subjective:     Patient ID: Xavier [name], male   DOB: 02/03/1966, 50 y.o.   MRN: [id number]  HPI   OV 06/23/2015  Chief Complaint  Patient presents with  . Follow-up    MW pt. Pt c/o occassional BIL lower extremity edema, right leg pain, and occassional redness. Pt c/o increase in SOB. Pt denies cough and CP/tightness.    50 year old male with history of first episode pulmonary embolism associated with deep vein thrombosis according to him and his wife in the summer of 2015. Maintain on Xarelto anticoagulation since then. No further recurrence. They have new problems at this visit. Namely since the diagnosis of pulmonary and lives in wife and he have noticed that his chronic nocturnal restless legs all worse for the last year or so. This is associated with chronic edema that is worse at the end of the day. This past one month he's also having cramps in bilateral calves that is worse with walking or when he sits for prolonged time. He also has to move his legs in order to shake off the pain and the cramps. This shooting pain going up from the feet to the knees. He feels he has to shake his legs to feel better. There is associated chronic snoring at night that is heavy according to the wife. In addition both of them admitted to easy excess daytime somnolence. He's never had sleep apnea workup. They're worried about recurrence of DVT as well as one episode of  warmth     has a past medical history of CORONARY ARTERY DISEASE (12/05/2006); DIABETES MELLITUS, TYPE II (12/05/2006); HYPERCHOLESTEROLEMIA (07/17/2007); ANEMIA, PERNICIOUS (04/03/2007); HYPERTENSION (12/05/2006); VISUAL ACUITY, DECREASED, RIGHT EYE (02/19/2009); Learning disability; and PE (pulmonary embolism).   reports that he quit smoking about 31 years ago. His smoking use included Cigarettes. He has a .3 pack-year smoking history. He has never used smokeless tobacco.  Past Surgical History  Procedure Laterality Date  . Repair left hand    .  Triple hernia repair      Allergies  Allergen Reactions  . Bee Venom Anaphylaxis  . Niacin     "makes his crazy"    Immunization History  Administered Date(s) Administered  . Pneumococcal Polysaccharide-23 10/07/2010  . Td 12/17/2008    Family History  Problem Relation Age of Onset  . COPD Mother   . Lymphoma Father      Current outpatient prescriptions:  .  diazepam (VALIUM) 5 MG tablet, Take 5 mg by mouth 2 (two) times daily. , Disp: , Rfl:  .  DULoxetine (CYMBALTA) 30 MG capsule, Take 30 mg by mouth daily.  , Disp: , Rfl:  .  EPINEPHrine (EPIPEN 2-PAK) 0.3 mg/0.3 mL IJ SOAJ injection, Inject 0.3 mLs (0.3 mg total) into the muscle once., Disp: 2 Device, Rfl: 5 .  FLUoxetine (PROZAC) 40 MG capsule, Take 40 mg by mouth daily.  , Disp: , Rfl:  .  glucose blood (ONETOUCH VERIO) test strip, 1 each by Other route daily. DX CODE E11.9, Disp: 100 each, Rfl: 12 .  JANUVIA 100 MG tablet, TAKE 1 TABLET BY MOUTH EVERY DAY, Disp: 30 tablet, Rfl: 9 .  losartan (COZAAR) 50 MG tablet, Take 50 mg by mouth daily., Disp: , Rfl:  .  metFORMIN (GLUCOPHAGE-XR) 500 MG 24 hr tablet, TAKE 2 TABLETS BY MOUTH TWICE A DAY, Disp: 120 tablet, Rfl: 2 .  pyridoxine (B-6) 100 MG tablet, Take 100 mg by mouth daily.  , Disp: , Rfl:  .  simvastatin (ZOCOR) 40 MG tablet, Take 1 tablet (40 mg total) by mouth at bedtime., Disp: 30 tablet, Rfl: 11 .  XARELTO 20 MG TABS tablet, TAKE 1 TABLET BY MOUTH EVERY DAY BEFORE SUPPER, Disp: 30 tablet, Rfl: 5 .  cyanocobalamin (,VITAMIN B-12,) 1000 MCG/ML injection, Inject 1,000 mcg into the muscle every 30 (thirty) days. Reported on 06/23/2015, Disp: , Rfl:     Review of Systems PER HPI    Objective:   Physical Exam  Constitutional: He is oriented to person, place, and time. He appears well-developed and well-nourished. No distress.  Body mass index is 37.02 kg/(m^2).   HENT:  Head: Normocephalic and atraumatic.  Right Ear: External ear normal.  Left Ear: External  ear normal.  Mouth/Throat: Oropharynx is clear and moist. No oropharyngeal exudate.  Retrognathia present Mallampati class III  Eyes: Conjunctivae and EOM are normal. Pupils are equal, round, and reactive to light. Right eye exhibits no discharge. Left eye exhibits no discharge. No scleral icterus.  Neck: Normal range of motion. Neck supple. No JVD present. No tracheal deviation present. No thyromegaly present.  Cardiovascular: Normal rate, regular rhythm and intact distal pulses.  Exam reveals no gallop and no friction rub.   No murmur heard. Pulmonary/Chest: Effort normal and breath sounds normal. No respiratory distress. He has no wheezes. He has no rales. He exhibits no tenderness.  Visceral obesity  Abdominal: Soft. Bowel sounds are normal. He exhibits no distension and no mass. There is no tenderness. There is no rebound and no guarding.  Musculoskeletal: Normal range of motion. He exhibits edema. He exhibits no tenderness.  Chronic venous stasis edema but no warmth or redness  Lymphadenopathy:    He has no cervical adenopathy.  Neurological: He is alert and oriented to person, place, and time. He has normal reflexes. No cranial nerve deficit. Coordination normal.  Skin: Skin is warm and dry. No rash noted. He is not diaphoretic. No erythema. No pallor.  Psychiatric: He has a normal mood and affect. His behavior is normal. Judgment and thought content normal.  Nursing note and vitals reviewed.   Filed Vitals:   06/23/15 1337  Height: 6' (1.829 m)  Weight: 273 lb (123.832 kg)    Filed Vitals:   06/23/15 1337  BP: 142/94  Pulse: 91  Height: 6' (1.829 m)  Weight: 273 lb (123.832 kg)  SpO2: 98%        Assessment:       ICD-9-CM ICD-10-CM   1. Cramp of both lower extremities 729.82 R25.2   2. Personal history of DVT (deep vein thrombosis) V12.51 Z86.718   3. History of snoring V15.89 Z84.89   4. Excessive somnolence disorder 780.54 G47.10   5. Other acute pulmonary  embolism (HCC)  I26.99     History of deep vein thrombosis and pulmonary embolism in 2015: Summer  2017 he would've completed 2 years of anticoagulation with Xarelto with which he is compliant. We will check a d-dimer to assess risk for recurrence. This will help his primary pulmonologist Dr. Melvyn Novas decide about duration of anti-coag relation treatment  He has restless legs associated with cramping in the setting of what sounds like undiagnosed sleep apnea: We will get a ferritin, CBC, be met, magnesium, phosphorus blood work and also refer him to a sleep specialist in our office    Plan:      Do d-dimer , ferritin, bmet, mag, phos and cbc blood work Do Ultrasound Lower extremity - eval for DVT Refer Sleep  doc Dr DeDios or others in our office  Followup  - will call with results  = see sleep doc  - back to Dr Melvyn Novas in 2 months or so       Dr. Brand Males, M.D., [facility].C.P Pulmonary and Critical Care Medicine Staff Physician [facility] Pager: [phone], If no answer or between  15:00h - 7:00h: call [phone]  06/23/2015 2:02 PM

## 2015-06-24 ENCOUNTER — Ambulatory Visit (HOSPITAL_COMMUNITY)
Admission: RE | Admit: 2015-06-24 | Discharge: 2015-06-24 | Disposition: A | Discharge status: Home / Self Care | Payer: Medicare Other | Source: Ambulatory Visit | Attending: Cardiovascular Disease | Admitting: Cardiovascular Disease

## 2015-06-24 ENCOUNTER — Other Ambulatory Visit: Payer: Self-pay | Admitting: Internal Medicine

## 2015-06-24 ENCOUNTER — Encounter: Payer: Medicare Other | Admitting: Physician Assistant

## 2015-06-24 DIAGNOSIS — I2699 Other pulmonary embolism without acute cor pulmonale: Secondary | ICD-10-CM

## 2015-06-24 DIAGNOSIS — I10 Essential (primary) hypertension: Secondary | ICD-10-CM | POA: Diagnosis not present

## 2015-06-24 DIAGNOSIS — Z86718 Personal history of other venous thrombosis and embolism: Secondary | ICD-10-CM

## 2015-06-24 DIAGNOSIS — R0602 Shortness of breath: Secondary | ICD-10-CM | POA: Insufficient documentation

## 2015-06-24 DIAGNOSIS — E119 Type 2 diabetes mellitus without complications: Secondary | ICD-10-CM | POA: Diagnosis not present

## 2015-06-24 DIAGNOSIS — E7800 Pure hypercholesterolemia, unspecified: Secondary | ICD-10-CM | POA: Diagnosis not present

## 2015-06-24 LAB — D-DIMER, QUANTITATIVE: D-Dimer, Quant: 0.31 ug/mL-FEU (ref 0.00–0.48)

## 2015-06-30 NOTE — Progress Notes (Signed)
Quick Note:  Called and spoke to pt's wife. Informed her of the results per MR. Pt's wife verbalized understanding and denied any further questions or concerns at this time.   ______

## 2015-07-08 ENCOUNTER — Encounter: Payer: Self-pay | Admitting: Pulmonary Disease

## 2015-07-08 ENCOUNTER — Ambulatory Visit (INDEPENDENT_AMBULATORY_CARE_PROVIDER_SITE_OTHER): Payer: Medicare Other | Admitting: Pulmonary Disease

## 2015-07-08 VITALS — BP 128/78 | HR 83 | Ht 72.0 in | Wt 274.2 lb

## 2015-07-08 DIAGNOSIS — I25110 Atherosclerotic heart disease of native coronary artery with unstable angina pectoris: Secondary | ICD-10-CM | POA: Diagnosis not present

## 2015-07-08 DIAGNOSIS — Z8489 Family history of other specified conditions: Secondary | ICD-10-CM

## 2015-07-08 DIAGNOSIS — G4710 Hypersomnia, unspecified: Secondary | ICD-10-CM | POA: Diagnosis not present

## 2015-07-08 DIAGNOSIS — Z87898 Personal history of other specified conditions: Secondary | ICD-10-CM

## 2015-07-08 DIAGNOSIS — G4730 Sleep apnea, unspecified: Secondary | ICD-10-CM

## 2015-07-08 DIAGNOSIS — G2581 Restless legs syndrome: Secondary | ICD-10-CM | POA: Diagnosis not present

## 2015-07-08 NOTE — Assessment & Plan Note (Addendum)
Patient has cramps in both lower extremities, constant, usually worse when sedentary. Plan to diagnose sleep apnea and treated. Observe if restless legs gets better with CPAP. If not, will need medicines for RLS.  Creat was N. Not anemic. Will need ferritin.

## 2015-07-08 NOTE — Assessment & Plan Note (Signed)
Patient was snoring. Hopefully better with CPAP therapy.

## 2015-07-08 NOTE — Patient Instructions (Signed)
1. We will schedule you for a home sleep study. 2.  We will call you with results of the home sleep study.   Return to clinic in 2 months.   Pollie Meyer, MD [facility] Pager [phone] After 3pm or if no response, call [phone] Office: [phone], Fax: [phone]

## 2015-07-08 NOTE — Assessment & Plan Note (Signed)
Patient has snoring, sleep talking. Denies witnessed apneas. Occasional gasping and choking. He sleeps 7 hours. Frequent awakenings. Unrefreshed sleep. Naps daily.  Plan for a home sleep study. Treat sleep apnea. Observe if restless leg syndrome gets better with CPAP.

## 2015-07-08 NOTE — Progress Notes (Signed)
Subjective:    Patient ID: Xavier [name], male    DOB: Mar 24, 1966, 50 y.o.   MRN: [id number]  HPI  This is the case of Xavier [name], 50 y.o. Male, who was referred by Dr. Kalman Shan  in consultation regarding possible OSA.   As you very well know, patient  Is a nonmoker. Patient denies an hstry of astma or COPD. He had clots in his left and right lower extremities in summer of 2015. Has been on xarelto since that time.  No issues with xarelto.   Since having the blood clots, patient's legs bothered him. He has leg pains almost all day, worse at night, worse when sedentary and not moving. Symptoms have worsened.  Patient has snoring, sleep talking. Denies witnessed apneas. Occasional gasping and choking. He sleeps 7 hours. Frequent awakenings. Unrefreshed sleep. Naps daily.    Review of Systems  Constitutional: Negative.  Negative for fever and unexpected weight change.  HENT: Negative.  Negative for congestion, dental problem, ear pain, nosebleeds, postnasal drip, rhinorrhea, sinus pressure, sneezing, sore throat and trouble swallowing.   Eyes: Negative.  Negative for redness and itching.  Respiratory: Positive for cough, shortness of breath and wheezing. Negative for chest tightness.   Cardiovascular: Positive for leg swelling. Negative for palpitations.  Gastrointestinal: Negative.  Negative for nausea and vomiting.  Endocrine: Negative.   Genitourinary: Negative.  Negative for dysuria.  Musculoskeletal: Positive for joint swelling and arthralgias.  Skin: Negative.  Negative for rash.  Allergic/Immunologic: Positive for environmental allergies.  Neurological: Negative.  Negative for headaches.  Hematological: Bruises/bleeds easily.  Psychiatric/Behavioral: Negative.  Negative for dysphoric mood. The patient is not nervous/anxious.   All other systems reviewed and are negative.   Past Medical History  Diagnosis Date  . CORONARY ARTERY DISEASE 12/05/2006  . DIABETES  MELLITUS, TYPE II 12/05/2006  . HYPERCHOLESTEROLEMIA 07/17/2007  . ANEMIA, PERNICIOUS 04/03/2007  . HYPERTENSION 12/05/2006  . VISUAL ACUITY, DECREASED, RIGHT EYE 02/19/2009  . Learning disability   . PE (pulmonary embolism)      Family History  Problem Relation Age of Onset  . COPD Mother   . Lymphoma Father      Past Surgical History  Procedure Laterality Date  . Repair left hand    . Triple hernia repair      Social History   Social History  . Marital Status: Married    Spouse Name: N/A  . Number of Children: N/A  . Years of Education: N/A   Occupational History  . Disabled    Social History Main Topics  . Smoking status: Former Smoker -- 0.10 packs/day for 3 years    Types: Cigarettes    Quit date: 05/15/1984  . Smokeless tobacco: Never Used  . Alcohol Use: Yes     Comment: Occassional  . Drug Use: No  . Sexual Activity: Not on file   Other Topics Concern  . Not on file   Social History Narrative   Disabled due to left wrist injury at work   Married with 2 children. 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   Allergies  Allergen Reactions  . Bee Venom Anaphylaxis  . Niacin     "makes his crazy"     Outpatient Prescriptions Prior to Visit  Medication Sig Dispense Refill  . cyanocobalamin (,VITAMIN B-12,) 1000 MCG/ML injection Inject 1,000 mcg into the muscle every 30 (thirty) days. Reported on 06/23/2015    . diazepam (VALIUM) 5 MG tablet Take 5 mg  by mouth 2 (two) times daily.     . DULoxetine (CYMBALTA) 30 MG capsule Take 30 mg by mouth daily.      Marland Kitchen EPINEPHrine (EPIPEN 2-PAK) 0.3 mg/0.3 mL IJ SOAJ injection Inject 0.3 mLs (0.3 mg total) into the muscle once. 2 Device 5  . FLUoxetine (PROZAC) 40 MG capsule Take 40 mg by mouth daily.      Marland Kitchen glucose blood (ONETOUCH VERIO) test strip 1 each by Other route daily. And lancets 1/day.  11.9 100 each 12  . JANUVIA 100 MG tablet TAKE 1 TABLET BY MOUTH EVERY DAY 30 tablet 9  . losartan-hydrochlorothiazide (HYZAAR) 50-12.5  MG tablet Take 1 tablet by mouth daily. 30 tablet 11  . metFORMIN (GLUCOPHAGE-XR) 500 MG 24 hr tablet TAKE 2 TABLETS BY MOUTH TWICE A DAY 120 tablet 2  . pyridoxine (B-6) 100 MG tablet Take 100 mg by mouth daily.      . simvastatin (ZOCOR) 40 MG tablet Take 1 tablet (40 mg total) by mouth at bedtime. 30 tablet 11  . XARELTO 20 MG TABS tablet TAKE 1 TABLET BY MOUTH EVERY DAY BEFORE SUPPER 30 tablet 5   No facility-administered medications prior to visit.   No orders of the defined types were placed in this encounter.           Objective:   Physical Exam  Vitals:  Filed Vitals:   07/08/15 1536  BP: 128/78  Pulse: 83  Height: 6' (1.829 m)  Weight: 274 lb 3.2 oz (124.376 kg)  SpO2: 97%    Constitutional/General:  Pleasant, well-nourished, well-developed, not in any distress,  Comfortably seating.  Well kempt  Body mass index is 37.18 kg/(m^2). Wt Readings from Last 3 Encounters:  07/08/15 274 lb 3.2 oz (124.376 kg)  06/23/15 272 lb (123.378 kg)  06/23/15 273 lb (123.832 kg)    Neck circumference: 19.5 in  HEENT: Pupils equal and reactive to light and accommodation. Anicteric sclerae. Normal nasal mucosa.   No oral  lesions,  mouth clear,  oropharynx clear, no postnasal drip. (-) Oral thrush. No dental caries.  Airway - Mallampati class IV  Neck: No masses. Midline trachea. No JVD, (-) LAD. (-) bruits appreciated.  Respiratory/Chest: Grossly normal chest. (-) deformity. (-) Accessory muscle use.  Symmetric expansion. (-) Tenderness on palpation.  Resonant on percussion.  Diminished BS on both lower lung zones. (-) wheezing, crackles, rhonchi (-) egophony  Cardiovascular: Regular rate and  rhythm, heart sounds normal, no murmur or gallops, no peripheral edema  Gastrointestinal:  Normal bowel sounds. Soft, non-tender. No hepatosplenomegaly.  (-) masses.   Musculoskeletal:  Normal muscle tone. Normal gait.   Extremities: Grossly normal. (-) clubbing, cyanosis.    (-) edema  Skin: (-) rash,lesions seen.   Neurological/Psychiatric : alert, oriented to time, place, person. Normal mood and affect      Assessment & Plan:  Hypersomnia Patient has snoring, sleep talking. Denies witnessed apneas. Occasional gasping and choking. He sleeps 7 hours. Frequent awakenings. Unrefreshed sleep. Naps daily.  Plan for a home sleep study. Treat sleep apnea. Observe if restless leg syndrome gets better with CPAP.  History of snoring Patient was snoring. Hopefully better with CPAP therapy.  RLS (restless legs syndrome) Patient has cramps in both lower extremities, constant, usually worse when sedentary. Plan to diagnose sleep apnea and treated. Observe if restless legs gets better with CPAP. If not, will need medicines for RLS.     Thank you very much for letting me participate in  this patient's care. Please do not hesitate to give me a call if you have any questions or concerns regarding the treatment plan.   Patient will follow up with me in in 2 mos.     Pollie Meyer, MD Pulmonary and Critical Care Medicine [facility] Pager: [phone] Office: [phone], Fax: [phone]

## 2015-07-19 ENCOUNTER — Other Ambulatory Visit: Payer: Self-pay | Admitting: Endocrinology

## 2015-07-19 DIAGNOSIS — G4733 Obstructive sleep apnea (adult) (pediatric): Secondary | ICD-10-CM | POA: Diagnosis not present

## 2015-07-26 ENCOUNTER — Telehealth: Payer: Self-pay | Admitting: Pulmonary Disease

## 2015-07-26 ENCOUNTER — Other Ambulatory Visit: Payer: Self-pay | Admitting: *Deleted

## 2015-07-26 DIAGNOSIS — G4733 Obstructive sleep apnea (adult) (pediatric): Secondary | ICD-10-CM

## 2015-07-26 DIAGNOSIS — G4710 Hypersomnia, unspecified: Secondary | ICD-10-CM

## 2015-07-26 MED ORDER — LOSARTAN POTASSIUM-HCTZ 50-12.5 MG PO TABS: 1.0000 | ORAL_TABLET | Freq: Every day | ORAL | Status: DC

## 2015-07-26 NOTE — Telephone Encounter (Signed)
Sheena, I read the home sleep study on this pt. He has severe OSA. Pls order autocpap 5-15 cm H2O with heated humidifier. He will need a mask fitting session. F/u as scheduled. Tnx.

## 2015-07-27 NOTE — Addendum Note (Signed)
Addended by: Marvene StaffOX, SHEENA H on: 07/27/2015 10:29 AM   Modules accepted: Orders

## 2015-07-27 NOTE — Telephone Encounter (Signed)
Pt aware of results and has no questions or concerns. Pt agreeable to start CPAP therapy. Reminded pt of appt date/time.

## 2015-08-23 ENCOUNTER — Encounter: Payer: Self-pay | Admitting: Internal Medicine

## 2015-08-23 ENCOUNTER — Ambulatory Visit (INDEPENDENT_AMBULATORY_CARE_PROVIDER_SITE_OTHER): Payer: Medicare Other | Admitting: Internal Medicine

## 2015-08-23 VITALS — BP 106/74 | HR 90 | Ht 72.0 in | Wt 269.0 lb

## 2015-08-23 DIAGNOSIS — I2699 Other pulmonary embolism without acute cor pulmonale: Secondary | ICD-10-CM | POA: Diagnosis not present

## 2015-08-23 DIAGNOSIS — I25110 Atherosclerotic heart disease of native coronary artery with unstable angina pectoris: Secondary | ICD-10-CM | POA: Diagnosis not present

## 2015-08-23 NOTE — Assessment & Plan Note (Signed)
See CTa  11/25/13 only subsegmental level - Echo 11/26/13 nl - Venous dopplers 11/26/13 nl  - Hypercoagulable profile not sent 11/26/13  - Pos fm hx dvt mother/sister   I had an extended discussion with the patient reviewing all relevant studies completed to date and  lasting 15 to 20 minutes of a 25 minute visit on the following ongoing concerns:   Discussed in detail all the  indications, usual  risks and alternatives  relative to the benefits with patient who agrees to proceed with indefinite xarelto (vs stop at some point if able to lose a lot more wt, and even then at that point would need careful f/u with hypercoagulable w/u off all rx x for asa 325 mg daily)   Each maintenance medication was reviewed in detail including most importantly the difference between maintenance and as needed and under what circumstances the prns are to be used.  Please see instructions for details which were reviewed in writing and the patient given a copy.     Plan to f/u with DeDios so no need to f/u here as well

## 2015-08-23 NOTE — Assessment & Plan Note (Signed)
Complicated by HBP/ Hyperlipidemia/ DM /PE/OSA  Body mass index is 36.48   Lab Results  Component Value Date   TSH 1.62 06/03/2015     Contributing to gerd and recurrent pe risk/  reviewed the need and the process to achieve and maintain neg calorie balance > defer f/u primary care including intermittently monitoring thyroid status

## 2015-08-23 NOTE — Progress Notes (Signed)
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 year old male with significant past medical history of HTN, DM, hypothyroidism presenting with PE, LE lesions. Pt states that he noticed progressive erythema and pain in LEs over the course of the past 24 hours. Has also had some generalized malaise and chills. Denies any CP or SOB. No recent change in medications. Diabetes has been stable. Reports that he took and extended trip to [place] 3-4 weeks ago.  Presented to ER hemodynamically stable. Afebrile. Satting >98% on RA. WBC 7.6, hgb 12.9, Cr 1.11, Glu 106. CT Angio shows several subsegmental RLL pulm emboli without heart strain with areas of patchy atelectasis mainly in RML. Bedside U/S performed on LEs that was preliminarily negative for DVT. Pt started prophylactically on vanc for cellulitis coverage prior to CT Angio.  Hospital Course:  Acute PE,  -was started on heparin on admission, then transitioned to Xarelto after discussion of options  -Hypercoagulable workup ordered and pending, will need to be FU as outpatient> not done   -only possible risk factor is recent extended trip to [place] 3 weeks back.  Blotchy discolored areas on both legs  -improving  -etiology unclear, suspect recent DVT that migrated to lungs  -Dopplers negative for DVT at this time.  -clinically no cellutlitis  -if worsens will need heme or vascular evaluation  -no  symptoms at this time  Procedures:  Dopplers: negative for DVT   12/17/2013 [facility]st [facility] office visit/ Wert   Chief Complaint  Patient presents with  . Pulmonary Consult    Referred by Dr. Everardo All for PE. Pt recently d/c from [facility] for PE.   doe improved over baseline. Legs nearly back to nl rec Keep toes as dry and clean as you can and if you still have cracks in the toe webs Dr Everardo All may need to send you to a dermatologist (tenactin powder) Work on weight loss and elevate legs as much as you can    06/15/2014 f/u ov/Wert re:   PE f/u @ 6 m on xarelto Chief Complaint  Patient presents with  . Follow-up    Pt denies any breathing issues at this time.   Not limited by breathing from desired activities  / legs better  rec Weight control is simply a matter of calorie balance  Continue xarelto unless there is a reasons to stop it for the next 6 months then return here to regroup    12/25/2014 f/u ov/Wert re:  spont pe/ pos fm hx mother and sister  Chief Complaint  Patient presents with  . Follow-up    Pt states that he is overall doing well. He has occ SOB that he relates to the humidity. No new co's today.    rec Wt control   08/23/2015  f/u ov/Wert re: spont pe/pos fm hx mother sister  Chief Complaint  Patient presents with  . Follow-up    Breathing is doing well. No new co's today.   [facility] with [facility]/u planned  Not limited by breathing from desired activities     No obvious day to day or daytime variability or assoc chronic cough or cp or chest tightness, subjective wheeze or overt sinus or hb symptoms. No unusual exp hx or h/o childhood pna/ asthma or knowledge of premature birth.  Sleeping ok without nocturnal  or early am exacerbation  of respiratory  c/o's or need for noct saba. Also denies any obvious fluctuation of symptoms with weather or environmental changes or other aggravating or alleviating factors except as outlined above    Current Medications, Allergies, Complete Past Medical History, Past Surgical History, Family History, and Social History were reviewed in [facility] electronic medical record.  ROS  The following are not active complaints unless bolded sore throat, dysphagia, dental problems, itching, sneezing,  nasal congestion or excess/ purulent secretions, ear ache,   fever, chills, sweats, unintended wt loss, classically pleuritic or exertional cp, hemoptysis,  orthopnea pnd or leg swelling, presyncope, palpitations, abdominal pain, anorexia, nausea, vomiting, diarrhea  or change in bowel or bladder habits, change in stools or urine, dysuria,hematuria,  rash, arthralgias, visual complaints, headache, numbness, weakness or ataxia or problems with walking or coordination,  change in mood/affect or memory.          Objective:   Physical Exam  06/15/2014          271>  12/25/2014 273  > 08/23/2015 269  Wt Readings from Last 3 Encounters:  12/16/13 272 lb (123.378 kg)  12/03/13 273 lb (123.832 kg)  11/27/13 270 lb 4.5 oz (122.6 kg)     Obese amb wm nad   HEENT: nl dentition, turbinates, and orophanx. Nl external ear canals without cough reflex   NECK :  without JVD/Nodes/TM/ nl carotid upstrokes bilaterally   LUNGS: no acc muscle use, clear to A and P bilaterally without cough on insp or exp maneuvers   CV:  RRR  no s3 or murmur or increase in P2,  Trace bilateral pitting lower ext  edema   ABD:  soft and nontender with nl excursion in the supine position. No bruits or organomegaly, bowel sounds nl  MS:  warm without deformities, calf tenderness, cyanosis or clubbing  SKIN: warm and dry / no redness/ calf tenderness/ swelling or any venous insuff changes at all   NEURO:  alert, approp, no deficits          Assessment & Plan:

## 2015-08-23 NOTE — Patient Instructions (Signed)
Continue xarelto until you see Dr DeDios and we will continue to refill it (pos family hx in mother/sister)   Keep working on the weight loss

## 2015-09-06 ENCOUNTER — Ambulatory Visit (INDEPENDENT_AMBULATORY_CARE_PROVIDER_SITE_OTHER): Payer: Medicare Other | Admitting: Pulmonary Disease

## 2015-09-06 ENCOUNTER — Encounter: Payer: Self-pay | Admitting: Pulmonary Disease

## 2015-09-06 VITALS — BP 128/82 | HR 81 | Ht 72.0 in | Wt 271.0 lb

## 2015-09-06 DIAGNOSIS — I2699 Other pulmonary embolism without acute cor pulmonale: Secondary | ICD-10-CM | POA: Diagnosis not present

## 2015-09-06 DIAGNOSIS — G4733 Obstructive sleep apnea (adult) (pediatric): Secondary | ICD-10-CM | POA: Diagnosis not present

## 2015-09-06 DIAGNOSIS — I25110 Atherosclerotic heart disease of native coronary artery with unstable angina pectoris: Secondary | ICD-10-CM

## 2015-09-06 DIAGNOSIS — G2581 Restless legs syndrome: Secondary | ICD-10-CM

## 2015-09-06 NOTE — Patient Instructions (Signed)
1. Continue with CPAP therapy as ordered. Call if with cpap issues.   Return to clinic in 6 mos.

## 2015-09-06 NOTE — Assessment & Plan Note (Signed)
Pt with unprovoked PE. Continue with xarelto.  

## 2015-09-06 NOTE — Progress Notes (Signed)
Subjective:    Patient ID: Xavier [name], male    DOB: 1965/06/08, 50 y.o.   MRN: [id number]  HPI  This is the case of Xavier AoBobby E Mena, 50 y.o. Male, who was referred by Dr. Kalman ShanMurali Ramaswamy  in consultation regarding possible OSA.   As you very well know, patient  Is a nonmoker. Patient denies an hstry of astma or COPD. He had clots in his left and right lower extremities in summer of 2015. Has been on xarelto since that time.  No issues with xarelto.   Since having the blood clots, patient's legs bothered him. He has leg pains almost all day, worse at night, worse when sedentary and not moving. Symptoms have worsened.  Patient has snoring, sleep talking. Denies witnessed apneas. Occasional gasping and choking. He sleeps 7 hours. Frequent awakenings. Unrefreshed sleep. Naps daily.  ROV (09/06/15) Pt returns to clinic after receiving his cpap. He feels better using CPAP. Sleeping better. No issues with it. He loves his CPAP. Download the first 3 days, 97%, AHI 1.2.  Less hypersomnia, snoring,fatigue. Feels better with cpap.   Review of Systems  Constitutional: Negative.  Negative for fever and unexpected weight change.  HENT: Negative.  Negative for congestion, dental problem, ear pain, nosebleeds, postnasal drip, rhinorrhea, sinus pressure, sneezing, sore throat and trouble swallowing.   Eyes: Negative.  Negative for redness and itching.  Respiratory: Positive for cough, shortness of breath and wheezing. Negative for chest tightness.   Cardiovascular: Positive for leg swelling. Negative for palpitations.  Gastrointestinal: Negative.  Negative for nausea and vomiting.  Endocrine: Negative.   Genitourinary: Negative.  Negative for dysuria.  Musculoskeletal: Positive for joint swelling and arthralgias.  Skin: Negative.  Negative for rash.  Allergic/Immunologic: Positive for environmental allergies.  Neurological: Negative.  Negative for headaches.  Hematological: Bruises/bleeds easily.   Psychiatric/Behavioral: Negative.  Negative for dysphoric mood. The patient is not nervous/anxious.   All other systems reviewed and are negative.     Objective:   Physical Exam  Vitals:  Filed Vitals:   09/06/15 0929  BP: 128/82  Pulse: 81  Height: 6' (1.829 m)  Weight: 271 lb (122.925 kg)  SpO2: 98%    Constitutional/General:  Pleasant, well-nourished, well-developed, not in any distress,  Comfortably seating.  Well kempt  Body mass index is 36.75 kg/(m^2). Wt Readings from Last 3 Encounters:  09/06/15 271 lb (122.925 kg)  08/23/15 269 lb (122.018 kg)  07/08/15 274 lb 3.2 oz (124.376 kg)    Neck circumference: 19.5 in  HEENT: Pupils equal and reactive to light and accommodation. Anicteric sclerae. Normal nasal mucosa.   No oral  lesions,  mouth clear,  oropharynx clear, no postnasal drip. (-) Oral thrush. No dental caries.  Airway - Mallampati class IV  Neck: No masses. Midline trachea. No JVD, (-) LAD. (-) bruits appreciated.  Respiratory/Chest: Grossly normal chest. (-) deformity. (-) Accessory muscle use.  Symmetric expansion. (-) Tenderness on palpation.  Resonant on percussion.  Diminished BS on both lower lung zones. (-) wheezing, crackles, rhonchi (-) egophony  Cardiovascular: Regular rate and  rhythm, heart sounds normal, no murmur or gallops, no peripheral edema  Gastrointestinal:  Normal bowel sounds. Soft, non-tender. No hepatosplenomegaly.  (-) masses.   Musculoskeletal:  Normal muscle tone. Normal gait.   Extremities: Grossly normal. (-) clubbing, cyanosis.  (-) edema  Skin: (-) rash,lesions seen.   Neurological/Psychiatric : alert, oriented to time, place, person. Normal mood and affect      Assessment &  Plan:  OSA (obstructive sleep apnea) HST (07/19/2015) AHI  36. Autocpap 5-15 (08/06/15) DL x 1 month 16%, AHI 1.2  Feels better using cpap. More energy. Less sleepiness. Feels wonderful with cpap.  No issues with it.    PE  (pulmonary embolism) Pt with unprovoked PE. Continue with xarelto.   RLS (restless legs syndrome) Patient has cramps in both lower extremities, constant, usually worse when sedentary. Better with cpap.     Obesity, morbid (HCC) Weight reduction.    RTC in 6 mos.      Pollie Meyer, MD Pulmonary and Critical Care Medicine [facility] Pager: [phone] Office: [phone], Fax: [phone]

## 2015-09-06 NOTE — Assessment & Plan Note (Signed)
Weight reduction 

## 2015-09-06 NOTE — Assessment & Plan Note (Signed)
HST (07/19/2015) AHI  36. Autocpap 5-15 (08/06/15) DL x 1 month 11%97%, AHI 1.2  Feels better using cpap. More energy. Less sleepiness. Feels wonderful with cpap.  No issues with it.

## 2015-09-06 NOTE — Assessment & Plan Note (Signed)
Patient has cramps in both lower extremities, constant, usually worse when sedentary. Better with cpap.    

## 2015-09-13 ENCOUNTER — Other Ambulatory Visit: Payer: Self-pay | Admitting: *Deleted

## 2015-09-13 DIAGNOSIS — I2510 Atherosclerotic heart disease of native coronary artery without angina pectoris: Secondary | ICD-10-CM

## 2015-09-13 DIAGNOSIS — I2699 Other pulmonary embolism without acute cor pulmonale: Secondary | ICD-10-CM

## 2015-09-13 DIAGNOSIS — E119 Type 2 diabetes mellitus without complications: Secondary | ICD-10-CM

## 2015-09-13 DIAGNOSIS — I10 Essential (primary) hypertension: Secondary | ICD-10-CM

## 2015-09-13 DIAGNOSIS — Z125 Encounter for screening for malignant neoplasm of prostate: Secondary | ICD-10-CM

## 2015-09-13 DIAGNOSIS — E7800 Pure hypercholesterolemia, unspecified: Secondary | ICD-10-CM

## 2015-09-13 MED ORDER — GLUCOSE BLOOD VI STRP: ORAL_STRIP | Status: DC

## 2015-09-16 ENCOUNTER — Other Ambulatory Visit: Payer: Self-pay | Admitting: Internal Medicine

## 2015-09-16 ENCOUNTER — Other Ambulatory Visit: Payer: Self-pay | Admitting: Endocrinology

## 2015-09-20 ENCOUNTER — Encounter: Payer: Self-pay | Admitting: Pulmonary Disease

## 2015-09-29 ENCOUNTER — Encounter (HOSPITAL_BASED_OUTPATIENT_CLINIC_OR_DEPARTMENT_OTHER): Payer: Self-pay | Admitting: Emergency Medicine

## 2015-09-29 ENCOUNTER — Emergency Department (HOSPITAL_BASED_OUTPATIENT_CLINIC_OR_DEPARTMENT_OTHER)
Admission: EM | Admit: 2015-09-29 | Discharge: 2015-09-29 | Disposition: A | Discharge status: Home / Self Care | Payer: Medicare Other | Attending: Emergency Medicine | Admitting: Emergency Medicine

## 2015-09-29 ENCOUNTER — Emergency Department (HOSPITAL_BASED_OUTPATIENT_CLINIC_OR_DEPARTMENT_OTHER): Payer: Medicare Other

## 2015-09-29 DIAGNOSIS — E119 Type 2 diabetes mellitus without complications: Secondary | ICD-10-CM | POA: Insufficient documentation

## 2015-09-29 DIAGNOSIS — Z87891 Personal history of nicotine dependence: Secondary | ICD-10-CM | POA: Diagnosis not present

## 2015-09-29 DIAGNOSIS — W228XXA Striking against or struck by other objects, initial encounter: Secondary | ICD-10-CM | POA: Diagnosis not present

## 2015-09-29 DIAGNOSIS — I10 Essential (primary) hypertension: Secondary | ICD-10-CM | POA: Insufficient documentation

## 2015-09-29 DIAGNOSIS — Y939 Activity, unspecified: Secondary | ICD-10-CM | POA: Diagnosis not present

## 2015-09-29 DIAGNOSIS — S60222A Contusion of left hand, initial encounter: Secondary | ICD-10-CM | POA: Diagnosis not present

## 2015-09-29 DIAGNOSIS — Z79899 Other long term (current) drug therapy: Secondary | ICD-10-CM | POA: Diagnosis not present

## 2015-09-29 DIAGNOSIS — Z7984 Long term (current) use of oral hypoglycemic drugs: Secondary | ICD-10-CM | POA: Insufficient documentation

## 2015-09-29 DIAGNOSIS — S6992XA Unspecified injury of left wrist, hand and finger(s), initial encounter: Secondary | ICD-10-CM | POA: Diagnosis present

## 2015-09-29 DIAGNOSIS — M7989 Other specified soft tissue disorders: Secondary | ICD-10-CM | POA: Diagnosis not present

## 2015-09-29 DIAGNOSIS — I2510 Atherosclerotic heart disease of native coronary artery without angina pectoris: Secondary | ICD-10-CM | POA: Insufficient documentation

## 2015-09-29 DIAGNOSIS — Y999 Unspecified external cause status: Secondary | ICD-10-CM | POA: Diagnosis not present

## 2015-09-29 DIAGNOSIS — Y929 Unspecified place or not applicable: Secondary | ICD-10-CM | POA: Insufficient documentation

## 2015-09-29 NOTE — Discharge Instructions (Signed)
Ice for 20 minutes every 2 hours while awake for the next 2 days.  Keep your hand elevated.  Ibuprofen 600 mg every 6 hours as needed for pain.   Hand Contusion A hand contusion is a deep bruise on your hand area. Contusions are the result of an injury that caused bleeding under the skin. The contusion may turn blue, purple, or yellow. Minor injuries will give you a painless contusion, but more severe contusions may stay painful and swollen for a few weeks. CAUSES  A contusion is usually caused by a blow, trauma, or direct force to an area of the body. SYMPTOMS   Swelling and redness of the injured area.  Discoloration of the injured area.  Tenderness and soreness of the injured area.  Pain. DIAGNOSIS  The diagnosis can be made by taking a history and performing a physical exam. An X-ray, CT scan, or MRI may be needed to determine if there were any associated injuries, such as broken bones (fractures). TREATMENT  Often, the best treatment for a hand contusion is resting, elevating, icing, and applying cold compresses to the injured area. Over-the-counter medicines may also be recommended for pain control. HOME CARE INSTRUCTIONS   Put ice on the injured area.  Put ice in a plastic bag.  Place a towel between your skin and the bag.  Leave the ice on for 15-20 minutes, 03-04 times a day.  Only take over-the-counter or prescription medicines as directed by your caregiver. Your caregiver may recommend avoiding anti-inflammatory medicines (aspirin, ibuprofen, and naproxen) for 48 hours because these medicines may increase bruising.  If told, use an elastic wrap as directed. This can help reduce swelling. You may remove the wrap for sleeping, showering, and bathing. If your fingers become numb, cold, or blue, take the wrap off and reapply it more loosely.  Elevate your hand with pillows to reduce swelling.  Avoid overusing your hand if it is painful. SEEK IMMEDIATE MEDICAL CARE IF:     You have increased redness, swelling, or pain in your hand.  Your swelling or pain is not relieved with medicines.  You have loss of feeling in your hand or are unable to move your fingers.  Your hand turns cold or blue.  You have pain when you move your fingers.  Your hand becomes warm to the touch.  Your contusion does not improve in 2 days. MAKE SURE YOU:   Understand these instructions.  Will watch your condition.  Will get help right away if you are not doing well or get worse.   This information is not intended to replace advice given to you by your health care provider. Make sure you discuss any questions you have with your health care provider.   Document Released: 10/21/2001 Document Revised: 01/24/2012 Document Reviewed: 10/23/2011 Elsevier Interactive Patient Education [employer]2016 [employer]

## 2015-09-29 NOTE — ED Provider Notes (Signed)
CSN: [id number]     Arrival date & time 09/29/15  2033 History  By signing my name below, I, [facility], attest that this documentation has been prepared under the direction and in the presence of Geoffery Lyons, MD. Electronically Signed: Randell Patient, ED Scribe. 09/29/2015. 10:03 PM.   Chief Complaint  Patient presents with  . Hand Injury   HPI Comments: Xavier [name] is a 50 y.o. male who presents to the Emergency Department complaining of constant, moderate left hand pain after an injury that occurred 8.5 hours ago. Pt states that he accidentally struck the dorsal surface of his left hand with a hammer, followed immediately by pain and gradual swelling. He notes an hx of extensive left hand and arm repair with permanent hardware from a previous injury. Denies right wrist swelling and any other symptoms currently.   Patient is a 50 y.o. male presenting with hand injury. The history is provided by the patient. No language interpreter was used.  Hand Injury Location:  Hand Time since incident:  9 hours Injury: yes   Hand location:  L hand Pain details:    Radiates to:  Does not radiate   Severity:  Moderate   Onset quality:  Sudden   Duration:  9 hours   Timing:  Constant   Progression:  Unchanged Chronicity:  New Dislocation: no   Foreign body present:  No foreign bodies Prior injury to area:  Yes Worsened by:  Movement Ineffective treatments:  None tried   Past Medical History  Diagnosis Date  . CORONARY ARTERY DISEASE 12/05/2006  . DIABETES MELLITUS, TYPE II 12/05/2006  . HYPERCHOLESTEROLEMIA 07/17/2007  . ANEMIA, PERNICIOUS 04/03/2007  . HYPERTENSION 12/05/2006  . VISUAL ACUITY, DECREASED, RIGHT EYE 02/19/2009  . Learning disability   . PE (pulmonary embolism)   . Cataract    Past Surgical History  Procedure Laterality Date  . Repair left hand    . Triple hernia repair    . Eye surgery     Family History  Problem Relation Age of Onset  . COPD Mother    . Lymphoma Father    Social History  Substance Use Topics  . Smoking status: Former Smoker -- 0.10 packs/day for 3 years    Types: Cigarettes    Quit date: 05/15/1984  . Smokeless tobacco: Never Used  . Alcohol Use: Yes     Comment: Occassional    Review of Systems  Musculoskeletal: Positive for myalgias. Negative for joint swelling.  All other systems reviewed and are negative.     Allergies  Bee venom and Niacin  Home Medications   Prior to Admission medications   Medication Sig Start Date End Date Taking? Authorizing Provider  cyanocobalamin (,VITAMIN B-12,) 1000 MCG/ML injection Inject 1,000 mcg into the muscle every 30 (thirty) days. Reported on 06/23/2015    Historical Provider, MD  diazepam (VALIUM) 5 MG tablet Take 5 mg by mouth 2 (two) times daily.     Historical Provider, MD  DULoxetine (CYMBALTA) 30 MG capsule Take 30 mg by mouth daily.      Historical Provider, MD  EPINEPHrine (EPIPEN 2-PAK) 0.3 mg/0.3 mL IJ SOAJ injection Inject 0.3 mLs (0.3 mg total) into the muscle once. 06/03/15   Romero Belling, MD  FLUoxetine (PROZAC) 40 MG capsule Take 40 mg by mouth daily.      Historical Provider, MD  glucose blood (ONETOUCH VERIO) test strip Use to test blood sugar 2 times daily. Dx: E11.9 09/13/15   Gregary Signs  Everardo AllEllison, MD  JANUVIA 100 MG tablet TAKE 1 TABLET BY MOUTH EVERY DAY 02/18/15   Romero BellingSean Ellison, MD  losartan (COZAAR) 50 MG tablet TAKE 1 TABLET BY MOUTH EVERY DAY 09/16/15   Romero BellingSean Ellison, MD  losartan-hydrochlorothiazide (HYZAAR) 50-12.5 MG tablet Take 1 tablet by mouth daily. 07/26/15   Romero BellingSean Ellison, MD  metFORMIN (GLUCOPHAGE-XR) 500 MG 24 hr tablet TAKE 2 TABLETS BY MOUTH TWICE A DAY 07/19/15   Romero BellingSean Ellison, MD  pyridoxine (B-6) 100 MG tablet Take 100 mg by mouth daily.      Historical Provider, MD  simvastatin (ZOCOR) 40 MG tablet Take 1 tablet (40 mg total) by mouth at bedtime. 06/03/15   Romero BellingSean Ellison, MD  XARELTO 20 MG TABS tablet TAKE 1 TABLET BY MOUTH EVERY DAY BEFORE SUPPER  09/16/15   Nyoka CowdenMichael B Wert, MD   BP 145/88 mmHg  Pulse 70  Temp(Src) 98.2 F (36.8 C) (Oral)  Resp 16  Ht 6' (1.829 m)  Wt 172 lb (78.019 kg)  BMI 23.32 kg/m2  SpO2 99% Physical Exam  Constitutional: He is oriented to person, place, and time. He appears well-developed and well-nourished.  HENT:  Head: Normocephalic.  Eyes: EOM are normal.  Neck: Normal range of motion.  Pulmonary/Chest: Effort normal.  Abdominal: He exhibits no distension.  Musculoskeletal: Normal range of motion. He exhibits edema.  There is swelling to dorsum of the left hand. Able flex and extend all fingers without difficulty.  Neurological: He is alert and oriented to person, place, and time.  Psychiatric: He has a normal mood and affect.  Nursing note and vitals reviewed.   ED Course  Procedures   DIAGNOSTIC STUDIES: Oxygen Saturation is 99% on RA, normal by my interpretation.    COORDINATION OF CARE: 9:52 PM Ordered left hand x-rays. Reviewed x-rays and discussed preliminary results with pt.  Discussed treatment plan with pt at bedside and pt agreed to plan.  10:00 PM Reviewed results of left hand imaging. Will discharge home.  Imaging Review Dg Hand Complete Left  09/29/2015  CLINICAL DATA:  Pain and swelling to the left wrist. Struck back of left hand with a hammer about an hour ago. EXAM: LEFT HAND - COMPLETE 3+ VIEW COMPARISON:  None. FINDINGS: Old fracture deformity of the distal left radius with plate and screw fixation. Degenerative changes in the radiocarpal and STT joints. Mild degenerative changes in the interphalangeal joints. No acute fracture or dislocation is demonstrated. Soft tissue swelling along the dorsum of the left hand. IMPRESSION: Dorsal soft tissue swelling. No acute bony abnormalities. Mild degenerative changes. Electronically Signed   By: Burman NievesWilliam  Stevens M.D.   On: 09/29/2015 21:50   I have personally reviewed and evaluated these images as part of my medical  decision-making.   MDM   Final diagnoses:  Hand contusion, left, initial encounter    X-rays negative for fracture. We'll treat as a contusion. The return as needed for any problems.  I personally performed the services described in this documentation, which was scribed in my presence. The recorded information has been reviewed and is accurate.       Geoffery Lyonsouglas Delo, MD 09/29/15 [phone]

## 2015-09-29 NOTE — ED Notes (Signed)
Pt hit back of left hand with a hammer while trying to hit a crow bar at 1330 today.  Swelling noted.  No deformity.  Skin is intact. Hx of multiple surgeries on left hand and arm.

## 2015-10-12 ENCOUNTER — Other Ambulatory Visit: Payer: Self-pay | Admitting: Endocrinology

## 2015-11-11 ENCOUNTER — Emergency Department (HOSPITAL_COMMUNITY)
Admission: EM | Admit: 2015-11-11 | Discharge: 2015-11-11 | Disposition: A | Discharge status: Home / Self Care | Payer: Medicare Other | Attending: Emergency Medicine | Admitting: Emergency Medicine

## 2015-11-11 ENCOUNTER — Encounter (HOSPITAL_COMMUNITY): Payer: Self-pay | Admitting: *Deleted

## 2015-11-11 DIAGNOSIS — Y999 Unspecified external cause status: Secondary | ICD-10-CM | POA: Diagnosis not present

## 2015-11-11 DIAGNOSIS — Z7984 Long term (current) use of oral hypoglycemic drugs: Secondary | ICD-10-CM | POA: Insufficient documentation

## 2015-11-11 DIAGNOSIS — Z79899 Other long term (current) drug therapy: Secondary | ICD-10-CM | POA: Insufficient documentation

## 2015-11-11 DIAGNOSIS — S0006XA Insect bite (nonvenomous) of scalp, initial encounter: Secondary | ICD-10-CM | POA: Diagnosis not present

## 2015-11-11 DIAGNOSIS — E119 Type 2 diabetes mellitus without complications: Secondary | ICD-10-CM | POA: Diagnosis not present

## 2015-11-11 DIAGNOSIS — Z87891 Personal history of nicotine dependence: Secondary | ICD-10-CM | POA: Diagnosis not present

## 2015-11-11 DIAGNOSIS — W57XXXA Bitten or stung by nonvenomous insect and other nonvenomous arthropods, initial encounter: Secondary | ICD-10-CM | POA: Diagnosis not present

## 2015-11-11 DIAGNOSIS — I2510 Atherosclerotic heart disease of native coronary artery without angina pectoris: Secondary | ICD-10-CM | POA: Diagnosis not present

## 2015-11-11 DIAGNOSIS — Z7901 Long term (current) use of anticoagulants: Secondary | ICD-10-CM | POA: Insufficient documentation

## 2015-11-11 DIAGNOSIS — Y939 Activity, unspecified: Secondary | ICD-10-CM | POA: Insufficient documentation

## 2015-11-11 DIAGNOSIS — I10 Essential (primary) hypertension: Secondary | ICD-10-CM | POA: Diagnosis not present

## 2015-11-11 DIAGNOSIS — L0291 Cutaneous abscess, unspecified: Secondary | ICD-10-CM

## 2015-11-11 DIAGNOSIS — Y929 Unspecified place or not applicable: Secondary | ICD-10-CM | POA: Insufficient documentation

## 2015-11-11 DIAGNOSIS — L0211 Cutaneous abscess of neck: Secondary | ICD-10-CM | POA: Insufficient documentation

## 2015-11-11 DIAGNOSIS — L03221 Cellulitis of neck: Secondary | ICD-10-CM | POA: Diagnosis not present

## 2015-11-11 MED ORDER — DOXYCYCLINE HYCLATE 100 MG PO CAPS: 100.0000 mg | ORAL_CAPSULE | Freq: Two times a day (BID) | ORAL | Status: DC

## 2015-11-11 MED ORDER — LIDOCAINE HCL (PF) 1 % IJ SOLN
5.0000 mL | Freq: Once | INTRAMUSCULAR | Status: AC
  Administered 2015-11-11: 5 mL
  Filled 2015-11-11: qty 5

## 2015-11-11 NOTE — ED Notes (Signed)
Pt states he pulled a tick out of his scalp 2 weeks ago. States that he got the whole tick out. States that the area is now red and sore.

## 2015-11-11 NOTE — ED Notes (Signed)
PA at bedside.

## 2015-11-11 NOTE — ED Provider Notes (Signed)
CSN: [id number]     Arrival date & time 11/11/15  1923 History  By signing my name below, I, Xavier [name], attest that this documentation has been prepared under the direction and in the presence of Everlene FarrierWilliam Dansie, PA-C. Electronically Signed: Placido SouLogan [name], ED Scribe. 11/11/2015. 9:13 PM.   Chief Complaint  Patient presents with  . Insect Bite   The history is provided by the patient. No language interpreter was used.    HPI Comments: [name] is a 50 y.o. male who presents to the Emergency Department complaining of an insect bite to his posterior scalp which occurred 2 weeks ago. Pt states that 2 weeks ago he removed a tick from his left posterior scalp and beginning yesterday began experiencing mild redness, mild purulent discharge from the site after scratching it, and moderate pain surrounding the site. He has applied ice to the region without significant relief. Pt confirms his tetanus vaccination is UTD. He denies rashes, fevers, joint pain, body aches, oral swelling and difficulty swallowing.   Past Medical History  Diagnosis Date  . CORONARY ARTERY DISEASE 12/05/2006  . DIABETES MELLITUS, TYPE II 12/05/2006  . HYPERCHOLESTEROLEMIA 07/17/2007  . ANEMIA, PERNICIOUS 04/03/2007  . HYPERTENSION 12/05/2006  . VISUAL ACUITY, DECREASED, RIGHT EYE 02/19/2009  . Learning disability   . PE (pulmonary embolism)   . Cataract    Past Surgical History  Procedure Laterality Date  . Repair left hand    . Triple hernia repair    . Eye surgery     Family History  Problem Relation Age of Onset  . COPD Mother   . Lymphoma Father    Social History  Substance Use Topics  . Smoking status: Former Smoker -- 0.10 packs/day for 3 years    Types: Cigarettes    Quit date: 05/15/1984  . Smokeless tobacco: Never Used  . Alcohol Use: Yes     Comment: Occassional    Review of Systems  Constitutional: Negative for fever and chills.  HENT: Negative for trouble swallowing.   Respiratory:  Negative for cough and shortness of breath.   Musculoskeletal: Negative for myalgias, joint swelling, arthralgias, neck pain and neck stiffness.  Skin: Positive for color change. Negative for rash.    Allergies  Bee venom and Niacin  Home Medications   Prior to Admission medications   Medication Sig Start Date End Date Taking? Authorizing Provider  cyanocobalamin (,VITAMIN B-12,) 1000 MCG/ML injection Inject 1,000 mcg into the muscle every 30 (thirty) days. Reported on 06/23/2015    Historical Provider, MD  diazepam (VALIUM) 5 MG tablet Take 5 mg by mouth 2 (two) times daily.     Historical Provider, MD  doxycycline (VIBRAMYCIN) 100 MG capsule Take 1 capsule (100 mg total) by mouth 2 (two) times daily. 11/11/15   Everlene FarrierWilliam Dansie, PA-C  DULoxetine (CYMBALTA) 30 MG capsule Take 30 mg by mouth daily.      Historical Provider, MD  EPINEPHrine (EPIPEN 2-PAK) 0.3 mg/0.3 mL IJ SOAJ injection Inject 0.3 mLs (0.3 mg total) into the muscle once. 06/03/15   Romero BellingSean Ellison, MD  FLUoxetine (PROZAC) 40 MG capsule Take 40 mg by mouth daily.      Historical Provider, MD  glucose blood (ONETOUCH VERIO) test strip Use to test blood sugar 2 times daily. Dx: E11.9 09/13/15   Romero BellingSean Ellison, MD  JANUVIA 100 MG tablet TAKE 1 TABLET BY MOUTH EVERY DAY 02/18/15   Romero BellingSean Ellison, MD  losartan (COZAAR) 50 MG tablet TAKE 1 TABLET  BY MOUTH EVERY DAY 09/16/15   Romero BellingSean Ellison, MD  losartan-hydrochlorothiazide (HYZAAR) 50-12.5 MG tablet Take 1 tablet by mouth daily. 07/26/15   Romero BellingSean Ellison, MD  metFORMIN (GLUCOPHAGE-XR) 500 MG 24 hr tablet TAKE 2 TABLETS BY MOUTH TWICE A DAY 10/12/15   Romero BellingSean Ellison, MD  pyridoxine (B-6) 100 MG tablet Take 100 mg by mouth daily.      Historical Provider, MD  simvastatin (ZOCOR) 40 MG tablet Take 1 tablet (40 mg total) by mouth at bedtime. 06/03/15   Romero BellingSean Ellison, MD  XARELTO 20 MG TABS tablet TAKE 1 TABLET BY MOUTH EVERY DAY BEFORE SUPPER 09/16/15   Nyoka CowdenMichael B Wert, MD   BP 133/86 mmHg  Pulse 69  Temp(Src)  98 F (36.7 C) (Oral)  Resp 16  SpO2 99%    Physical Exam  Constitutional: He appears well-developed and well-nourished. No distress.  HENT:  Head: Normocephalic and atraumatic.  Eyes: Right eye exhibits no discharge. Left eye exhibits no discharge.  Cardiovascular: Normal rate, regular rhythm and intact distal pulses.   Pulmonary/Chest: Effort normal. No respiratory distress.  Neurological: He is alert. Coordination normal.  Skin: Skin is warm and dry. No rash noted. He is not diaphoretic. There is erythema. No pallor.  1 cm area of induration to the left posterior neck behind the left ear; no fluctuance; no discharge; overlying erythema without surrounding erythema.   Psychiatric: He has a normal mood and affect. His behavior is normal.  Nursing note and vitals reviewed.   ED Course  .Marland Kitchen.Incision and Drainage Date/Time: 11/11/2015 9:25 PM Performed by: Everlene FarrierANSIE, WILLIAM Authorized by: Everlene FarrierANSIE, WILLIAM Consent: Verbal consent obtained. Risks and benefits: risks, benefits and alternatives were discussed Consent given by: patient Patient understanding: patient states understanding of the procedure being performed Patient consent: the patient's understanding of the procedure matches consent given Procedure consent: procedure consent matches procedure scheduled Relevant documents: relevant documents present and verified Test results: test results available and properly labeled Site marked: the operative site was marked Imaging studies: imaging studies available Required items: required blood products, implants, devices, and special equipment available Patient identity confirmed: verbally with patient Time out: Immediately prior to procedure a "time out" was called to verify the correct patient, procedure, equipment, support staff and site/side marked as required. Type: abscess Body area: neck Location details: left posterior neck Anesthesia: local infiltration Local anesthetic:  lidocaine 1% without epinephrine Anesthetic total: 0.5 ml Patient sedated: no Risk factor: underlying major vessel Scalpel size: 11 Incision type: single straight Incision depth: dermal Complexity: simple Drainage: purulent Drainage amount: scant Wound treatment: wound left open Packing material: none Patient tolerance: Patient tolerated the procedure well with no immediate complications    DIAGNOSTIC STUDIES: Oxygen Saturation is 100% on RA, normal by my interpretation.    COORDINATION OF CARE: 9:12 PM Discussed next steps with pt. Pt verbalized understanding and is agreeable with the plan.   EMERGENCY DEPARTMENT US SOFT TISSUE INTERPRETATION "Study: Limited Ultrasound of the noted body part in comments below"  INDICATIONS: Soft tissue infection Multiple views of the body part are obtained with a multi-frequency linear probe  PERFORMED BY:  Myself  IMAGES ARCHIVED?: Yes  SIDE:Left  BODY PART:Neck  FINDINGS: Abcess present  LIMITATIONS:  Emergent Procedure  INTERPRETATION:  Abcess present  COMMENT:  Small abscess to left upper neck.   Labs Review Labs Reviewed - No data to display  Imaging Review No results found.   EKG Interpretation None      Filed Vitals:  11/11/15 1940 11/11/15 2033 11/11/15 2140  BP: 145/94 138/84 133/86  Pulse: 72 67 69  Temp: 97.6 F (36.4 C) 97.6 F (36.4 C) 98 F (36.7 C)  TempSrc: Oral Oral Oral  Resp: SpO2: 100% 100% 99%     MDM   Meds given in ED:  Medications  lidocaine (PF) (XYLOCAINE) 1 % injection 5 mL (5 mLs Infiltration Given by Other 11/11/15 2129)    New Prescriptions   DOXYCYCLINE (VIBRAMYCIN) 100 MG CAPSULE    Take 1 capsule (100 mg total) by mouth 2 (two) times daily.    Final diagnoses:  Abscess  Tick bite   This is a 50 y.o. male who presents to the Emergency Department complaining of an insect bite to his posterior scalp which occurred 2 weeks ago. Pt states that 2 weeks ago he  removed a tick from his left posterior scalp and beginning yesterday began experiencing mild redness, mild purulent discharge from the site after scratching it, and moderate pain surrounding the site. He has applied ice to the region without significant relief. Pt confirms his tetanus vaccination is UTD.  On exam the patient is afebrile nontoxic appearing. He has a 1 cm area of induration and overlying erythema to his left posterior neck. No discharge noted. On ultrasound there evidence of possibly small area of abscess. Will perform incision and drainage. I&D performed by the entire well by the patient. A small amount of purulent discharge was obtained. Will start the patient on doxycycline as he believes this is from a tick bite. He has no body or muscle aches. He is afebrile. He has good follow-up with primary care. I encouraged him to follow-up with his PCP in 2-3 days for wound recheck. I discussed return precautions. I advised the patient to follow-up with their primary care provider this week. I advised the patient to return to the emergency department with new or worsening symptoms or new concerns. The patient verbalized understanding and agreement with plan.    I personally performed the services described in this documentation, which was scribed in my presence. The recorded information has been reviewed and is accurate.       Everlene Farrier, PA-C 11/11/15 [id number]  Vanetta Mulders, MD 11/15/15 2206

## 2015-11-11 NOTE — Discharge Instructions (Signed)
Incision and Drainage Incision and drainage is a procedure in which a sac-like structure (cystic structure) is opened and drained. The area to be drained usually contains material such as pus, fluid, or blood.  LET YOUR CAREGIVER KNOW ABOUT:   Allergies to medicine.  Medicines taken, including vitamins, herbs, eyedrops, over-the-counter medicines, and creams.  Use of steroids (by mouth or creams).  Previous problems with anesthetics or numbing medicines.  History of bleeding problems or blood clots.  Previous surgery.  Other health problems, including diabetes and kidney problems.  Possibility of pregnancy, if this applies. RISKS AND COMPLICATIONS  Pain.  Bleeding.  Scarring.  Infection. BEFORE THE PROCEDURE  You may need to have an ultrasound or other imaging tests to see how large or deep your cystic structure is. Blood tests may also be used to determine if you have an infection or how severe the infection is. You may need to have a tetanus shot. PROCEDURE  The affected area is cleaned with a cleaning fluid. The cyst area will then be numbed with a medicine (local anesthetic). A small incision will be made in the cystic structure. A syringe or catheter may be used to drain the contents of the cystic structure, or the contents may be squeezed out. The area will then be flushed with a cleansing solution. After cleansing the area, it is often gently packed with a gauze or another wound dressing. Once it is packed, it will be covered with gauze and tape or some other type of wound dressing. AFTER THE PROCEDURE  1. Often, you will be allowed to go home right after the procedure. 2. You may be given antibiotic medicine to prevent or heal an infection. 3. If the area was packed with gauze or some other wound dressing, you will likely need to come back in 1 to 2 days to get it removed. 4. The area should heal in about 14 days.   This information is not intended to replace advice  given to you by your health care provider. Make sure you discuss any questions you have with your health care provider.   Document Released: 10/25/2000 Document Revised: 10/31/2011 Document Reviewed: 06/26/2011 Elsevier Interactive Patient Education 2016 [employer] Tick Bite Information Ticks are insects that attach themselves to the skin and draw blood for food. There are various types of ticks. Common types include wood ticks and deer ticks. Most ticks live in shrubs and grassy areas. Ticks can climb onto your body when you make contact with leaves or grass where the tick is waiting. The most common places on the body for ticks to attach themselves are the scalp, neck, armpits, waist, and groin. Most tick bites are harmless, but sometimes ticks carry germs that cause diseases. These germs can be spread to a person during the tick's feeding process. The chance of a disease spreading through a tick bite depends on:   The type of tick.  Time of year.   How long the tick is attached.   Geographic location.  HOW CAN YOU PREVENT TICK BITES? Take these steps to help prevent tick bites when you are outdoors:  Wear protective clothing. Long sleeves and long pants are best.   Wear white clothes so you can see ticks more easily.  Tuck your pant legs into your socks.   If walking on a trail, stay in the middle of the trail to avoid brushing against bushes.  Avoid walking through areas with long grass.  Put insect repellent on  all exposed skin and along boot tops, pant legs, and sleeve cuffs.   Check clothing, hair, and skin repeatedly and before going inside.   Brush off any ticks that are not attached.  Take a shower or bath as soon as possible after being outdoors.  WHAT IS THE PROPER WAY TO REMOVE A TICK? Ticks should be removed as soon as possible to help prevent diseases caused by tick bites. 5. If latex gloves are available, put them on before trying to remove a tick.   6. Using fine-point tweezers, grasp the tick as close to the skin as possible. You may also use curved forceps or a tick removal tool. Grasp the tick as close to its head as possible. Avoid grasping the tick on its body. 7. Pull gently with steady upward pressure until the tick lets go. Do not twist the tick or jerk it suddenly. This may break off the tick's head or mouth parts. 8. Do not squeeze or crush the tick's body. This could force disease-carrying fluids from the tick into your body.  9. After the tick is removed, wash the bite area and your hands with soap and water or other disinfectant such as alcohol. 10. Apply a small amount of antiseptic cream or ointment to the bite site.  11. Wash and disinfect any instruments that were used.  Do not try to remove a tick by applying a hot match, petroleum jelly, or fingernail polish to the tick. These methods do not work and may increase the chances of disease being spread from the tick bite.  WHEN SHOULD YOU SEEK MEDICAL CARE? Contact your health care provider if you are unable to remove a tick from your skin or if a part of the tick breaks off and is stuck in the skin.  After a tick bite, you need to be aware of signs and symptoms that could be related to diseases spread by ticks. Contact your health care provider if you develop any of the following in the days or weeks after the tick bite:  Unexplained fever.  Rash. A circular rash that appears days or weeks after the tick bite may indicate the possibility of Lyme disease. The rash may resemble a target with a bull's-eye and may occur at a different part of your body than the tick bite.  Redness and swelling in the area of the tick bite.   Tender, swollen lymph glands.   Diarrhea.   Weight loss.   Cough.   Fatigue.   Muscle, joint, or bone pain.   Abdominal pain.   Headache.   Lethargy or a change in your level of consciousness.  Difficulty walking or moving your  legs.   Numbness in the legs.   Paralysis.  Shortness of breath.   Confusion.   Repeated vomiting.    This information is not intended to replace advice given to you by your health care provider. Make sure you discuss any questions you have with your health care provider.   Document Released: 04/28/2000 Document Revised: 05/22/2014 Document Reviewed: 10/09/2012 Elsevier Interactive Patient Education [employer] Inc[employer]

## 2015-11-15 ENCOUNTER — Other Ambulatory Visit: Payer: Self-pay | Admitting: Endocrinology

## 2015-12-01 ENCOUNTER — Ambulatory Visit (INDEPENDENT_AMBULATORY_CARE_PROVIDER_SITE_OTHER): Payer: Medicare Other | Admitting: Endocrinology

## 2015-12-01 ENCOUNTER — Encounter: Payer: Self-pay | Admitting: Endocrinology

## 2015-12-01 VITALS — BP 120/82 | HR 79 | Wt 266.0 lb

## 2015-12-01 DIAGNOSIS — D510 Vitamin B12 deficiency anemia due to intrinsic factor deficiency: Secondary | ICD-10-CM

## 2015-12-01 DIAGNOSIS — I10 Essential (primary) hypertension: Secondary | ICD-10-CM | POA: Diagnosis not present

## 2015-12-01 DIAGNOSIS — I2699 Other pulmonary embolism without acute cor pulmonale: Secondary | ICD-10-CM

## 2015-12-01 DIAGNOSIS — Z125 Encounter for screening for malignant neoplasm of prostate: Secondary | ICD-10-CM

## 2015-12-01 DIAGNOSIS — I2510 Atherosclerotic heart disease of native coronary artery without angina pectoris: Secondary | ICD-10-CM

## 2015-12-01 DIAGNOSIS — I25110 Atherosclerotic heart disease of native coronary artery with unstable angina pectoris: Secondary | ICD-10-CM

## 2015-12-01 DIAGNOSIS — E119 Type 2 diabetes mellitus without complications: Secondary | ICD-10-CM | POA: Diagnosis not present

## 2015-12-01 DIAGNOSIS — E7800 Pure hypercholesterolemia, unspecified: Secondary | ICD-10-CM

## 2015-12-01 LAB — POCT GLYCOSYLATED HEMOGLOBIN (HGB A1C): Hemoglobin A1C: 6.1

## 2015-12-01 MED ORDER — CYANOCOBALAMIN 1000 MCG/ML IJ SOLN
1000.0000 ug | Freq: Once | INTRAMUSCULAR | Status: AC
  Administered 2015-12-01: 1000 ug via INTRAMUSCULAR

## 2015-12-01 MED ORDER — GLUCOSE BLOOD VI STRP: 1.0000 | ORAL_STRIP | Freq: Every day | Status: DC

## 2015-12-01 MED ORDER — EPINEPHRINE 0.3 MG/0.3ML IJ SOAJ: 0.3000 mg | Freq: Once | INTRAMUSCULAR | Status: DC

## 2015-12-01 NOTE — Progress Notes (Signed)
Subjective:    Patient ID: Xavier [name], male    DOB: [birth year], 50 y.o.   MRN: [id number]  HPI Pt returns for f/u of diabetes mellitus:  DM type: 2 Dx'ed: 2005 Complications: CAD and polyneuropathy Therapy: 2 oral meds DKA: never Severe hypoglycemia: never Pancreatitis: never Other: he did not tolerate pioglitizone (edema); he has never taken insulin, except in the hospital.   Interval history: no cbg record, but states cbg's are well-controlled.  pt states he feels well in general.  Past Medical History  Diagnosis Date  . CORONARY ARTERY DISEASE 12/05/2006  . DIABETES MELLITUS, TYPE II 12/05/2006  . HYPERCHOLESTEROLEMIA 07/17/2007  . ANEMIA, PERNICIOUS 04/03/2007  . HYPERTENSION 12/05/2006  . VISUAL ACUITY, DECREASED, RIGHT EYE 02/19/2009  . Learning disability   . PE (pulmonary embolism)   . Cataract     Past Surgical History  Procedure Laterality Date  . Repair left hand    . Triple hernia repair    . Eye surgery      Social History   Social History  . Marital Status: Married    Spouse Name: N/A  . Number of Children: N/A  . Years of Education: N/A   Occupational History  . Disabled    Social History Main Topics  . Smoking status: Former Smoker -- 0.10 packs/day for 3 years    Types: Cigarettes    Quit date: 05/15/1984  . Smokeless tobacco: Never Used  . Alcohol Use: Yes     Comment: Occassional  . Drug Use: No  . Sexual Activity: Not on file   Other Topics Concern  . Not on file   Social History Narrative   Disabled due to left wrist injury at work    Current Outpatient Prescriptions on File Prior to Visit  Medication Sig Dispense Refill  . cyanocobalamin (,VITAMIN B-12,) 1000 MCG/ML injection Inject 1,000 mcg into the muscle every 30 (thirty) days. Reported on 06/23/2015    . diazepam (VALIUM) 5 MG tablet Take 5 mg by mouth 2 (two) times daily.     . DULoxetine (CYMBALTA) 30 MG capsule Take 30 mg by mouth daily.      Marland Kitchen. FLUoxetine (PROZAC) 40 MG  capsule Take 40 mg by mouth daily.      Marland Kitchen. JANUVIA 100 MG tablet TAKE 1 TABLET BY MOUTH EVERY DAY 30 tablet 3  . losartan-hydrochlorothiazide (HYZAAR) 50-12.5 MG tablet Take 1 tablet by mouth daily. 90 tablet 1  . metFORMIN (GLUCOPHAGE-XR) 500 MG 24 hr tablet TAKE 2 TABLETS BY MOUTH TWICE A DAY 120 tablet 2  . pyridoxine (B-6) 100 MG tablet Take 100 mg by mouth daily.      . simvastatin (ZOCOR) 40 MG tablet Take 1 tablet (40 mg total) by mouth at bedtime. 30 tablet 11  . XARELTO 20 MG TABS tablet TAKE 1 TABLET BY MOUTH EVERY DAY BEFORE SUPPER 30 tablet 5   No current facility-administered medications on file prior to visit.    Allergies  Allergen Reactions  . Bee Venom Anaphylaxis  . Niacin     "makes his crazy"    Family History  Problem Relation Age of Onset  . COPD Mother   . Lymphoma Father     BP 120/82 mmHg  Pulse 79  Wt 266 lb (120.657 kg)  SpO2 98%  Review of Systems He has lost a few lbs    Objective:   Physical Exam VITAL SIGNS:  See vs page GENERAL: no distress Pulses: dorsalis pedis  intact bilat.   MSK: no deformity of the feet CV: 1+ bilat leg edema Skin:  no ulcer on the feet.  normal color and temp on the feet. Neuro: sensation is intact to touch on the feet Ext: There is bilateral onychomycosis of the toenails.   A1c=6.1%    Assessment & Plan:  Type 2 DM: well-controlled Edema, worse: we'll follow  Patient is advised the following: Patient Instructions  Please come back for a "medicare wellness" appointment in 6 months (must be after 06/02/16).   Please continue the same medications.   Romero Belling, MD

## 2015-12-01 NOTE — Patient Instructions (Addendum)
Please come back for a "medicare wellness" appointment in 6 months (must be after 06/02/16).   Please continue the same medications.

## 2015-12-21 ENCOUNTER — Other Ambulatory Visit: Payer: Self-pay

## 2015-12-21 MED ORDER — METFORMIN HCL ER 500 MG PO TB24: 1000.0000 mg | ORAL_TABLET | Freq: Two times a day (BID) | ORAL | 1 refills | Status: DC

## 2015-12-22 ENCOUNTER — Telehealth: Payer: Self-pay

## 2015-12-22 NOTE — Telephone Encounter (Signed)
ERROR. Rx for Metformin already sent to the pt's local pharmacy.

## 2016-01-17 ENCOUNTER — Other Ambulatory Visit: Payer: Self-pay | Admitting: Endocrinology

## 2016-02-21 ENCOUNTER — Other Ambulatory Visit: Payer: Self-pay | Admitting: *Deleted

## 2016-02-21 MED ORDER — METFORMIN HCL ER 500 MG PO TB24
1000.0000 mg | ORAL_TABLET | Freq: Two times a day (BID) | ORAL | 1 refills | Status: DC
Start: 2016-02-21 — End: 2016-06-15

## 2016-03-16 ENCOUNTER — Other Ambulatory Visit: Payer: Self-pay | Admitting: Internal Medicine

## 2016-03-16 ENCOUNTER — Other Ambulatory Visit: Payer: Self-pay | Admitting: Endocrinology

## 2016-03-27 ENCOUNTER — Encounter: Payer: Self-pay | Admitting: Pulmonary Disease

## 2016-03-29 ENCOUNTER — Ambulatory Visit (INDEPENDENT_AMBULATORY_CARE_PROVIDER_SITE_OTHER): Payer: Medicare Other | Admitting: Pulmonary Disease

## 2016-03-29 ENCOUNTER — Encounter: Payer: Self-pay | Admitting: Pulmonary Disease

## 2016-03-29 DIAGNOSIS — I2699 Other pulmonary embolism without acute cor pulmonale: Secondary | ICD-10-CM | POA: Diagnosis not present

## 2016-03-29 DIAGNOSIS — I25110 Atherosclerotic heart disease of native coronary artery with unstable angina pectoris: Secondary | ICD-10-CM | POA: Diagnosis not present

## 2016-03-29 DIAGNOSIS — G4733 Obstructive sleep apnea (adult) (pediatric): Secondary | ICD-10-CM | POA: Diagnosis not present

## 2016-03-29 DIAGNOSIS — E669 Obesity, unspecified: Secondary | ICD-10-CM | POA: Diagnosis not present

## 2016-03-29 DIAGNOSIS — G2581 Restless legs syndrome: Secondary | ICD-10-CM

## 2016-03-29 DIAGNOSIS — E66812 Obesity, class 2: Secondary | ICD-10-CM

## 2016-03-29 NOTE — Assessment & Plan Note (Signed)
HST (07/19/2015) AHI  36. Autocpap 5-15 (08/06/15) DL for oct 2017 >> 16%87%,  AHI 1.   Feels better using cpap. More energy. Less sleepiness. Feels wonderful with cpap. Feels benefit of CPAP. No issues with it.  Plan :  We extensively discussed the importance of treating OSA and the need to use PAP therapy.   Continue with  autocpap 5-15 cm water. Tolerating well.    Patient was instructed to have mask, tubings, filter, reservoir cleaned at least once a week with soapy water.  Patient was instructed to call the office if he/she is having issues with the PAP device.    I advised patient to obtain sufficient amount of sleep --  7 to 8 hours at least in a 24 hr period.  Patient was advised to follow good sleep hygiene.  Patient was advised NOT to engage in activities requiring concentration and/or vigilance if he/she is and  sleepy.  Patient is NOT to drive if he/she is sleepy.

## 2016-03-29 NOTE — Patient Instructions (Signed)
  It was a pleasure taking care of you today!  Continue using your CPAP machine.   Please make sure you use your CPAP device everytime you sleep.  We will monitor the usage of your machine per your insurance requirement.  Your insurance company may take the machine from you if you are not using it regularly.   Please clean the mask, tubings, filter, water reservoir with soapy water every week.  Please use distilled water for the water reservoir.   Please call the office or your machine provider (DME company) if you are having issues with the device.   Return to clinic in 1 year  with Dr. De Dios    

## 2016-03-29 NOTE — Assessment & Plan Note (Signed)
Pt with unprovoked PE. Continue with xarelto.

## 2016-03-29 NOTE — Assessment & Plan Note (Signed)
Patient has cramps in both lower extremities, constant, usually worse when sedentary. Better with cpap.

## 2016-03-29 NOTE — Assessment & Plan Note (Signed)
Weight reduction 

## 2016-03-29 NOTE — Progress Notes (Signed)
Subjective:    Patient ID: Xavier [name], male    DOB: 1965/07/17, 50 y.o.   MRN: [id number]  HPI  This is the case of Xavier AoBobby E Westbrook, 50 y.o. Male, who was referred by Dr. Kalman ShanMurali Ramaswamy  in consultation regarding possible OSA.   As you very well know, patient  Is a nonmoker. Patient denies an hstry of astma or COPD. He had clots in his left and right lower extremities in summer of 2015. Has been on xarelto since that time.  No issues with xarelto.   Since having the blood clots, patient's legs bothered him. He has leg pains almost all day, worse at night, worse when sedentary and not moving. Symptoms have worsened.  Patient has snoring, sleep talking. Denies witnessed apneas. Occasional gasping and choking. He sleeps 7 hours. Frequent awakenings. Unrefreshed sleep. Naps daily.  ROV (09/06/15) Pt returns to clinic after receiving his cpap. He feels better using CPAP. Sleeping better. No issues with it. He loves his CPAP. Download the first 3 days, 97%, AHI 1.2.  Less hypersomnia, snoring,fatigue. Feels better with cpap.   ROV 03/29/16 Patient returns to the office as follow-up on his sleep apnea. Since last seen, he uses his CPAP machine. Feels better using it. More energy. Less sleepiness. Download the last month: 87%, AHI 1.  Review of Systems  Constitutional: Negative.  Negative for fever and unexpected weight change.  HENT: Negative.  Negative for congestion, dental problem, ear pain, nosebleeds, postnasal drip, rhinorrhea, sinus pressure, sneezing, sore throat and trouble swallowing.   Eyes: Negative.  Negative for redness and itching.  Respiratory: Positive for cough, shortness of breath and wheezing. Negative for chest tightness.   Cardiovascular: Positive for leg swelling. Negative for palpitations.  Gastrointestinal: Negative.  Negative for nausea and vomiting.  Endocrine: Negative.   Genitourinary: Negative.  Negative for dysuria.  Musculoskeletal: Positive for  arthralgias and joint swelling.  Skin: Negative.  Negative for rash.  Allergic/Immunologic: Positive for environmental allergies.  Neurological: Negative.  Negative for headaches.  Hematological: Bruises/bleeds easily.  Psychiatric/Behavioral: Negative.  Negative for dysphoric mood. The patient is not nervous/anxious.   All other systems reviewed and are negative.     Objective:   Physical Exam  Vitals:  Vitals:   03/29/16 0901  BP: 126/84  Pulse: 67  SpO2: 97%  Weight: 270 lb 3.2 oz (122.6 kg)  Height: 6' (1.829 m)    Constitutional/General:  Pleasant, well-nourished, well-developed, not in any distress,  Comfortably seating.  Well kempt  Body mass index is 36.65 kg/m. Wt Readings from Last 3 Encounters:  03/29/16 270 lb 3.2 oz (122.6 kg)  12/01/15 266 lb (120.7 kg)  09/29/15 172 lb (78 kg)    Neck circumference: 19.5 in  HEENT: Pupils equal and reactive to light and accommodation. Anicteric sclerae. Normal nasal mucosa.   No oral  lesions,  mouth clear,  oropharynx clear, no postnasal drip. (-) Oral thrush. No dental caries.  Airway - Mallampati class IV  Neck: No masses. Midline trachea. No JVD, (-) LAD. (-) bruits appreciated.  Respiratory/Chest: Grossly normal chest. (-) deformity. (-) Accessory muscle use.  Symmetric expansion. (-) Tenderness on palpation.  Resonant on percussion.  Diminished BS on both lower lung zones. (-) wheezing, crackles, rhonchi (-) egophony  Cardiovascular: Regular rate and  rhythm, heart sounds normal, no murmur or gallops, no peripheral edema  Gastrointestinal:  Normal bowel sounds. Soft, non-tender. No hepatosplenomegaly.  (-) masses.   Musculoskeletal:  Normal muscle tone.  Normal gait.   Extremities: Grossly normal. (-) clubbing, cyanosis.  (-) edema  Skin: (-) rash,lesions seen.   Neurological/Psychiatric : alert, oriented to time, place, person. Normal mood and affect      Assessment & Plan:  OSA (obstructive  sleep apnea) HST (07/19/2015) AHI  36. Autocpap 5-15 (08/06/15) DL for oct 2017 >> 16%87%,  AHI 1.   Feels better using cpap. More energy. Less sleepiness. Feels wonderful with cpap. Feels benefit of CPAP. No issues with it.  Plan :  We extensively discussed the importance of treating OSA and the need to use PAP therapy.   Continue with  autocpap 5-15 cm water. Tolerating well.    Patient was instructed to have mask, tubings, filter, reservoir cleaned at least once a week with soapy water.  Patient was instructed to call the office if he/she is having issues with the PAP device.    I advised patient to obtain sufficient amount of sleep --  7 to 8 hours at least in a 24 hr period.  Patient was advised to follow good sleep hygiene.  Patient was advised NOT to engage in activities requiring concentration and/or vigilance if he/she is and  sleepy.  Patient is NOT to drive if he/she is sleepy.    Pulmonary embolism (HCC) Pt with unprovoked PE. Continue with xarelto.   RLS (restless legs syndrome) Patient has cramps in both lower extremities, constant, usually worse when sedentary. Better with cpap.     Obesity Weight reduction  RTC in 12 mos.      Pollie MeyerJ. Angelo A. de Dios, MD Pulmonary and Critical Care Medicine [facility] Pager: [phone] Office: [phone], Fax: [phone]

## 2016-04-14 ENCOUNTER — Other Ambulatory Visit: Payer: Self-pay

## 2016-04-14 MED ORDER — RIVAROXABAN 20 MG PO TABS: ORAL_TABLET | ORAL | 0 refills | Status: DC

## 2016-06-02 ENCOUNTER — Encounter: Payer: Medicare Other | Admitting: Endocrinology

## 2016-06-12 ENCOUNTER — Other Ambulatory Visit: Payer: Self-pay | Admitting: Endocrinology

## 2016-06-15 ENCOUNTER — Ambulatory Visit (INDEPENDENT_AMBULATORY_CARE_PROVIDER_SITE_OTHER): Payer: Medicare Other | Admitting: Endocrinology

## 2016-06-15 ENCOUNTER — Encounter: Payer: Self-pay | Admitting: Nurse Practitioner

## 2016-06-15 ENCOUNTER — Encounter: Payer: Self-pay | Admitting: Endocrinology

## 2016-06-15 VITALS — BP 144/88 | HR 82 | Ht 72.0 in | Wt 274.0 lb

## 2016-06-15 DIAGNOSIS — E119 Type 2 diabetes mellitus without complications: Secondary | ICD-10-CM

## 2016-06-15 DIAGNOSIS — Z0000 Encounter for general adult medical examination without abnormal findings: Secondary | ICD-10-CM | POA: Diagnosis not present

## 2016-06-15 DIAGNOSIS — E538 Deficiency of other specified B group vitamins: Secondary | ICD-10-CM | POA: Diagnosis not present

## 2016-06-15 DIAGNOSIS — Z119 Encounter for screening for infectious and parasitic diseases, unspecified: Secondary | ICD-10-CM

## 2016-06-15 DIAGNOSIS — Z01811 Encounter for preprocedural respiratory examination: Secondary | ICD-10-CM | POA: Insufficient documentation

## 2016-06-15 DIAGNOSIS — G8929 Other chronic pain: Secondary | ICD-10-CM | POA: Diagnosis not present

## 2016-06-15 DIAGNOSIS — M549 Dorsalgia, unspecified: Secondary | ICD-10-CM | POA: Diagnosis not present

## 2016-06-15 LAB — CBC WITH DIFFERENTIAL/PLATELET
Basophils Absolute: 0.1 10*3/uL (ref 0.0–0.1)
Basophils Relative: 0.9 % (ref 0.0–3.0)
Eosinophils Absolute: 0.3 10*3/uL (ref 0.0–0.7)
Eosinophils Relative: 3.5 % (ref 0.0–5.0)
HCT: 40 % (ref 39.0–52.0)
Hemoglobin: 13.8 g/dL (ref 13.0–17.0)
Lymphocytes Relative: 26.5 % (ref 12.0–46.0)
Lymphs Abs: 2 10*3/uL (ref 0.7–4.0)
MCHC: 34.5 g/dL (ref 30.0–36.0)
MCV: 90.6 fl (ref 78.0–100.0)
Monocytes Absolute: 0.7 10*3/uL (ref 0.1–1.0)
Monocytes Relative: 9.7 % (ref 3.0–12.0)
Neutro Abs: 4.5 10*3/uL (ref 1.4–7.7)
Neutrophils Relative %: 59.4 % (ref 43.0–77.0)
Platelets: 355 10*3/uL (ref 150.0–400.0)
RBC: 4.41 Mil/uL (ref 4.22–5.81)
RDW: 12.9 % (ref 11.5–15.5)
WBC: 7.6 10*3/uL (ref 4.0–10.5)

## 2016-06-15 LAB — URINALYSIS, ROUTINE W REFLEX MICROSCOPIC
Bilirubin Urine: NEGATIVE
Hgb urine dipstick: NEGATIVE
Ketones, ur: NEGATIVE
Leukocytes, UA: NEGATIVE
Nitrite: NEGATIVE
RBC / HPF: NONE SEEN (ref 0–?)
Specific Gravity, Urine: 1.025 (ref 1.000–1.030)
Total Protein, Urine: NEGATIVE
Urine Glucose: NEGATIVE
Urobilinogen, UA: 0.2 (ref 0.0–1.0)
pH: 5.5 (ref 5.0–8.0)

## 2016-06-15 LAB — HEPATIC FUNCTION PANEL
ALT: 20 U/L (ref 0–53)
AST: 15 U/L (ref 0–37)
Albumin: 4.4 g/dL (ref 3.5–5.2)
Alkaline Phosphatase: 65 U/L (ref 39–117)
Bilirubin, Direct: 0.1 mg/dL (ref 0.0–0.3)
Total Bilirubin: 0.6 mg/dL (ref 0.2–1.2)
Total Protein: 7.5 g/dL (ref 6.0–8.3)

## 2016-06-15 LAB — POCT GLYCOSYLATED HEMOGLOBIN (HGB A1C): Hemoglobin A1C: 7.5

## 2016-06-15 LAB — MICROALBUMIN / CREATININE URINE RATIO
Creatinine,U: 187.3 mg/dL
Microalb Creat Ratio: 3.6 mg/g (ref 0.0–30.0)
Microalb, Ur: 6.8 mg/dL — ABNORMAL HIGH (ref 0.0–1.9)

## 2016-06-15 LAB — LIPID PANEL
Cholesterol: 131 mg/dL (ref 0–200)
HDL: 46.6 mg/dL (ref >39.00)
NonHDL: 83.99
Total CHOL/HDL Ratio: 3
Triglycerides: 212 mg/dL — ABNORMAL HIGH (ref 0.0–149.0)
VLDL: 42.4 mg/dL — ABNORMAL HIGH (ref 0.0–40.0)

## 2016-06-15 LAB — LDL CHOLESTEROL, DIRECT: Direct LDL: 63 mg/dL

## 2016-06-15 LAB — TSH: TSH: 1.51 u[IU]/mL (ref 0.35–4.50)

## 2016-06-15 MED ORDER — CANAGLIFLOZIN 300 MG PO TABS: 300.0000 mg | ORAL_TABLET | Freq: Every day | ORAL | 11 refills | Status: DC

## 2016-06-15 MED ORDER — CYANOCOBALAMIN 1000 MCG/ML IJ SOLN
1000.0000 ug | Freq: Once | INTRAMUSCULAR | Status: AC
  Administered 2016-06-15: 1000 ug via INTRAMUSCULAR

## 2016-06-15 NOTE — Patient Instructions (Addendum)
Please consider these measures for your health:  minimize alcohol.  Do not use tobacco products.  Have a colonoscopy at least every 10 years from age 50.  Women should have an annual mammogram from age 50.  Keep firearms safely stored.  Always use seat belts.  have working smoke alarms in your home.  See an eye doctor and dentist regularly.  Never drive under the influence of alcohol or drugs (including prescription drugs).  Those with fair skin should take precautions against the sun, and should carefully examine their skin once per month, for any new or changed moles.   We should continue to recheck the cardiogram once a year.   I have sent a prescription to your pharmacy, to add "invokana."   Please come back for a follow-up appointment in 3 months.

## 2016-06-15 NOTE — Progress Notes (Signed)
Subjective:    Patient ID: Xavier [name], male    DOB: [birth year], 51 y.o.   MRN: [id number]  HPI Pt returns for f/u of diabetes mellitus:  DM type: 2 Dx'ed: 2005 Complications: mild CAD, and polyneuropathy.   Therapy: 2 oral meds DKA: never Severe hypoglycemia: never.   Pancreatitis: never.  Other: he did not tolerate pioglitizone (edema); he has never taken insulin, except in the hospital.   Interval history: no cbg record, but states cbg's are well-controlled.  pt states he feels well in general.   Abnormal ecg is noted.   Pt states few years of intermittent moderate pain at the lower back.  No assoc pain at the legs Past Medical History:  Diagnosis Date  . ANEMIA, PERNICIOUS 04/03/2007  . Cataract   . CORONARY ARTERY DISEASE 12/05/2006  . DIABETES MELLITUS, TYPE II 12/05/2006  . HYPERCHOLESTEROLEMIA 07/17/2007  . HYPERTENSION 12/05/2006  . Learning disability   . PE (pulmonary embolism)   . VISUAL ACUITY, DECREASED, RIGHT EYE 02/19/2009    Past Surgical History:  Procedure Laterality Date  . EYE SURGERY    . Repair Left hand    . triple hernia repair      Social History   Social History  . Marital status: Married    Spouse name: N/A  . Number of children: N/A  . Years of education: N/A   Occupational History  . Disabled Disabled   Social History Main Topics  . Smoking status: Former Smoker    Packs/day: 0.10    Years: 3.00    Types: Cigarettes    Quit date: 05/15/1984  . Smokeless tobacco: Never Used  . Alcohol use Yes     Comment: Occassional  . Drug use: No  . Sexual activity: Not on file   Other Topics Concern  . Not on file   Social History Narrative   Disabled due to left wrist injury at work    Current Outpatient Prescriptions on File Prior to Visit  Medication Sig Dispense Refill  . cyanocobalamin (,VITAMIN B-12,) 1000 MCG/ML injection Inject 1,000 mcg into the muscle every 30 (thirty) days. Reported on 06/23/2015    . diazepam (VALIUM) 5 MG  tablet Take 5 mg by mouth 2 (two) times daily.     . DULoxetine (CYMBALTA) 30 MG capsule Take 30 mg by mouth daily.      Marland Kitchen. EPINEPHrine (EPIPEN 2-PAK) 0.3 mg/0.3 mL IJ SOAJ injection Inject 0.3 mLs (0.3 mg total) into the muscle once. 2 Device 5  . FLUoxetine (PROZAC) 40 MG capsule Take 40 mg by mouth daily.      Marland Kitchen. glucose blood (ONETOUCH VERIO) test strip 1 each by Other route daily. And lancets 1/day 100 each 3  . JANUVIA 100 MG tablet TAKE 1 TABLET BY MOUTH EVERY DAY 30 tablet 3  . losartan-hydrochlorothiazide (HYZAAR) 50-12.5 MG tablet Take 1 tablet by mouth daily. 90 tablet 1  . metFORMIN (GLUCOPHAGE-XR) 500 MG 24 hr tablet TAKE 2 TABLETS BY MOUTH TWICE A DAY 120 tablet 2  . pyridoxine (B-6) 100 MG tablet Take 100 mg by mouth daily.      . rivaroxaban (XARELTO) 20 MG TABS tablet TAKE 1 TABLET BY MOUTH EVERY DAY BEFORE SUPPER 30 tablet 0  . simvastatin (ZOCOR) 40 MG tablet Take 1 tablet (40 mg total) by mouth at bedtime. 30 tablet 11   No current facility-administered medications on file prior to visit.     Allergies  Allergen Reactions  .  Bee Venom Anaphylaxis  . Niacin     "makes his crazy"    Family History  Problem Relation Age of Onset  . COPD Mother   . Lymphoma Father     BP (!) 144/88   Pulse 82   Ht 6' (1.829 m)   Wt 274 lb (124.3 kg)   SpO2 98%   BMI 37.16 kg/m    Review of Systems Denies chest pain and sob.      Objective:   Physical Exam VS: see vs page GEN: no distress HEAD: head: no deformity eyes: no periorbital swelling, no proptosis external nose and ears are normal mouth: no lesion seen NECK: supple, thyroid is not enlarged CHEST WALL: no deformity.  Prominent xyphoid (no change) LUNGS: clear to auscultation CV: reg rate and rhythm, no murmur ABD: abdomen is soft, nontender.  no hepatosplenomegaly.  not distended.  no hernia MUSCULOSKELETAL: muscle bulk and strength are grossly normal.  no obvious joint swelling.  gait is normal and  steady EXTEMITIES: no deformity.  no ulcer on the feet.  feet are of normal color and temp.  Trace bilat leg edema, and bilat vv's.  PULSES: dorsalis pedis intact bilat.  no carotid bruit NEURO:  cn 2-12 grossly intact.   readily moves all 4's.  sensation is intact to touch on the feet, but decreased from normal SKIN:  Normal texture and temperature.  No rash or suspicious lesion is visible.   NODES:  None palpable at the neck PSYCH: alert, well-oriented.  Does not appear anxious nor depressed.    I personally reviewed electrocardiogram tracing (today): Indication: DM Impression: NSR.  No MI.  No hypertrophy.  Inverted T-waves anteriorly Compared to 2017: TWA is greater Lab Results  Component Value Date   HGBA1C 7.5 06/15/2016   Lab Results  Component Value Date   CHOL 131 06/15/2016   HDL 46.60 06/15/2016   LDLCALC (H) 06/11/2010    120        Total Cholesterol/HDL:CHD Risk Coronary Heart Disease Risk Table                     Men   Women  1/2 Average Risk   3.4   3.3  Average Risk       5.0   4.4  2 X Average Risk   9.6   7.1  3 X Average Risk  23.4   11.0        Use the calculated Patient Ratio above and the CHD Risk Table to determine the patient's CHD Risk.        ATP III CLASSIFICATION (LDL):  <100     mg/dL   Optimal  161-096  mg/dL   Near or Above                    Optimal  130-159  mg/dL   Borderline  045-409  mg/dL   High  >811     mg/dL   Very High   LDLDIRECT 63.0 06/15/2016   TRIG 212.0 (H) 06/15/2016   CHOLHDL 3 06/15/2016      Assessment & Plan:  Type 2 DM, with CAD: worse. Dyslipidemia: well-controlled, considering non-fasting specimen. HTN: borderline control: Please continue the same medication for now.  We'll follow.    Subjective:   Patient here for Medicare annual wellness visit and management of other chronic and acute problems.     Risk factors: medical problems   Roster of Physicians Providing  Medical Care to Patient:  See  "snapshot"   Activities of Daily Living: In your present state of health, do you have any difficulty performing the following activities?:  Preparing food and eating?: No  Bathing yourself: No  Getting dressed: No  Using the toilet: No  Moving around from place to place: No  In the past year have you fallen or had a near fall?: No    Home Safety: Has smoke detector and wears seat belts. Firearms are safely stored. No excess sun exposure.    Diet and Exercise  Current exercise habits: pt says good Dietary issues discussed: pt reports a healthy diet   Depression Screen  Q1: Over the past two weeks, have you felt down, depressed or hopeless?n o  Q2: Over the past two weeks, have you felt little interest or pleasure in doing things? no   The following portions of the patient's history were reviewed and updated as appropriate: allergies, current medications, past family history, past medical history, past social history, past surgical history and problem list.   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   Hearing: grossly normal Body mass index:  See vs page Msk: pt easily and quickly performs "get-up-and-go" from a sitting position Cognitive Impairment Assessment: cognition, memory and judgment appear normal.  remembers 3/3 at 5 minutes.  excellent recall.  can easily read and write a sentence.  alert and oriented x 3   Assessment:   Medicare wellness utd on preventive parameters    Plan:   During the course of the visit the patient was educated and counseled about appropriate screening and preventive services including:       Fall prevention   Screening mammography  Bone densitometry screening  Diabetes screening  Nutrition counseling   Vaccines / LABS Zostavax / Pneumococcal Vaccine  today  PSA  Patient Instructions (the written plan) was given to the patient.

## 2016-06-18 ENCOUNTER — Other Ambulatory Visit: Payer: Self-pay | Admitting: Endocrinology

## 2016-06-18 DIAGNOSIS — I2510 Atherosclerotic heart disease of native coronary artery without angina pectoris: Secondary | ICD-10-CM

## 2016-06-18 DIAGNOSIS — Z125 Encounter for screening for malignant neoplasm of prostate: Secondary | ICD-10-CM

## 2016-06-18 DIAGNOSIS — E119 Type 2 diabetes mellitus without complications: Secondary | ICD-10-CM

## 2016-06-18 DIAGNOSIS — E7800 Pure hypercholesterolemia, unspecified: Secondary | ICD-10-CM

## 2016-06-18 DIAGNOSIS — I10 Essential (primary) hypertension: Secondary | ICD-10-CM

## 2016-06-18 DIAGNOSIS — I2699 Other pulmonary embolism without acute cor pulmonale: Secondary | ICD-10-CM

## 2016-06-25 ENCOUNTER — Emergency Department (HOSPITAL_COMMUNITY): Payer: Medicare Other

## 2016-06-25 ENCOUNTER — Encounter (HOSPITAL_COMMUNITY): Payer: Self-pay | Admitting: *Deleted

## 2016-06-25 ENCOUNTER — Inpatient Hospital Stay (HOSPITAL_COMMUNITY)
Admission: EM | Admit: 2016-06-25 | Discharge: 2016-06-28 | DRG: 247 | Disposition: A | Discharge status: Home / Self Care | Payer: Medicare Other | Attending: Internal Medicine | Admitting: Internal Medicine

## 2016-06-25 DIAGNOSIS — I25110 Atherosclerotic heart disease of native coronary artery with unstable angina pectoris: Secondary | ICD-10-CM | POA: Diagnosis not present

## 2016-06-25 DIAGNOSIS — I2699 Other pulmonary embolism without acute cor pulmonale: Secondary | ICD-10-CM | POA: Diagnosis present

## 2016-06-25 DIAGNOSIS — Z6837 Body mass index (BMI) 37.0-37.9, adult: Secondary | ICD-10-CM

## 2016-06-25 DIAGNOSIS — E1140 Type 2 diabetes mellitus with diabetic neuropathy, unspecified: Secondary | ICD-10-CM | POA: Diagnosis not present

## 2016-06-25 DIAGNOSIS — K029 Dental caries, unspecified: Secondary | ICD-10-CM | POA: Diagnosis present

## 2016-06-25 DIAGNOSIS — I2510 Atherosclerotic heart disease of native coronary artery without angina pectoris: Secondary | ICD-10-CM | POA: Diagnosis present

## 2016-06-25 DIAGNOSIS — Z86718 Personal history of other venous thrombosis and embolism: Secondary | ICD-10-CM

## 2016-06-25 DIAGNOSIS — R0600 Dyspnea, unspecified: Secondary | ICD-10-CM

## 2016-06-25 DIAGNOSIS — E785 Hyperlipidemia, unspecified: Secondary | ICD-10-CM | POA: Diagnosis present

## 2016-06-25 DIAGNOSIS — R079 Chest pain, unspecified: Secondary | ICD-10-CM | POA: Diagnosis not present

## 2016-06-25 DIAGNOSIS — I10 Essential (primary) hypertension: Secondary | ICD-10-CM | POA: Diagnosis not present

## 2016-06-25 DIAGNOSIS — I252 Old myocardial infarction: Secondary | ICD-10-CM

## 2016-06-25 DIAGNOSIS — G4730 Sleep apnea, unspecified: Secondary | ICD-10-CM | POA: Diagnosis present

## 2016-06-25 DIAGNOSIS — Z7901 Long term (current) use of anticoagulants: Secondary | ICD-10-CM

## 2016-06-25 DIAGNOSIS — Z888 Allergy status to other drugs, medicaments and biological substances status: Secondary | ICD-10-CM

## 2016-06-25 DIAGNOSIS — Z86711 Personal history of pulmonary embolism: Secondary | ICD-10-CM

## 2016-06-25 DIAGNOSIS — R9439 Abnormal result of other cardiovascular function study: Secondary | ICD-10-CM

## 2016-06-25 DIAGNOSIS — Z955 Presence of coronary angioplasty implant and graft: Secondary | ICD-10-CM

## 2016-06-25 DIAGNOSIS — D510 Vitamin B12 deficiency anemia due to intrinsic factor deficiency: Secondary | ICD-10-CM | POA: Diagnosis present

## 2016-06-25 DIAGNOSIS — M545 Low back pain: Secondary | ICD-10-CM | POA: Diagnosis present

## 2016-06-25 DIAGNOSIS — E7800 Pure hypercholesterolemia, unspecified: Secondary | ICD-10-CM | POA: Diagnosis present

## 2016-06-25 DIAGNOSIS — E119 Type 2 diabetes mellitus without complications: Secondary | ICD-10-CM

## 2016-06-25 DIAGNOSIS — Z7984 Long term (current) use of oral hypoglycemic drugs: Secondary | ICD-10-CM

## 2016-06-25 DIAGNOSIS — F329 Major depressive disorder, single episode, unspecified: Secondary | ICD-10-CM | POA: Diagnosis present

## 2016-06-25 DIAGNOSIS — Z87891 Personal history of nicotine dependence: Secondary | ICD-10-CM

## 2016-06-25 DIAGNOSIS — Z91030 Bee allergy status: Secondary | ICD-10-CM

## 2016-06-25 DIAGNOSIS — I493 Ventricular premature depolarization: Secondary | ICD-10-CM | POA: Diagnosis present

## 2016-06-25 DIAGNOSIS — G4733 Obstructive sleep apnea (adult) (pediatric): Secondary | ICD-10-CM | POA: Diagnosis present

## 2016-06-25 DIAGNOSIS — G8929 Other chronic pain: Secondary | ICD-10-CM | POA: Diagnosis present

## 2016-06-25 DIAGNOSIS — R0602 Shortness of breath: Secondary | ICD-10-CM | POA: Diagnosis not present

## 2016-06-25 DIAGNOSIS — F410 Panic disorder [episodic paroxysmal anxiety] without agoraphobia: Secondary | ICD-10-CM | POA: Diagnosis present

## 2016-06-25 DIAGNOSIS — F32A Depression, unspecified: Secondary | ICD-10-CM | POA: Diagnosis present

## 2016-06-25 DIAGNOSIS — Z79899 Other long term (current) drug therapy: Secondary | ICD-10-CM

## 2016-06-25 HISTORY — DX: Personal history of other medical treatment: Z92.89

## 2016-06-25 HISTORY — DX: Low back pain: M54.5

## 2016-06-25 HISTORY — DX: Migraine, unspecified, not intractable, without status migrainosus: G43.909

## 2016-06-25 HISTORY — DX: Other chronic pain: G89.29

## 2016-06-25 HISTORY — DX: Obstructive sleep apnea (adult) (pediatric): G47.33

## 2016-06-25 HISTORY — DX: Personal history of other diseases of the musculoskeletal system and connective tissue: Z87.39

## 2016-06-25 HISTORY — DX: Cerebral infarction, unspecified: I63.9

## 2016-06-25 HISTORY — DX: Other complications of anesthesia, initial encounter: T88.59XA

## 2016-06-25 HISTORY — DX: Major depressive disorder, single episode, unspecified: F32.9

## 2016-06-25 HISTORY — DX: Low back pain, unspecified: M54.50

## 2016-06-25 HISTORY — DX: Acute myocardial infarction, unspecified: I21.9

## 2016-06-25 HISTORY — DX: Gastro-esophageal reflux disease without esophagitis: K21.9

## 2016-06-25 HISTORY — DX: Acute embolism and thrombosis of unspecified deep veins of unspecified lower extremity: I82.409

## 2016-06-25 HISTORY — DX: Dependence on other enabling machines and devices: Z99.89

## 2016-06-25 HISTORY — DX: Anxiety disorder, unspecified: F41.9

## 2016-06-25 HISTORY — DX: Unspecified osteoarthritis, unspecified site: M19.90

## 2016-06-25 HISTORY — DX: Adverse effect of unspecified anesthetic, initial encounter: T41.45XA

## 2016-06-25 HISTORY — DX: Personal history of urinary calculi: Z87.442

## 2016-06-25 HISTORY — DX: Transient cerebral ischemic attack, unspecified: G45.9

## 2016-06-25 LAB — COMPREHENSIVE METABOLIC PANEL
ALT: 24 U/L (ref 17–63)
AST: 19 U/L (ref 15–41)
Albumin: 4.1 g/dL (ref 3.5–5.0)
Alkaline Phosphatase: 52 U/L (ref 38–126)
Anion gap: 11 (ref 5–15)
BUN: 14 mg/dL (ref 6–20)
CO2: 26 mmol/L (ref 22–32)
Calcium: 9.5 mg/dL (ref 8.9–10.3)
Chloride: 100 mmol/L — ABNORMAL LOW (ref 101–111)
Creatinine, Ser: 0.98 mg/dL (ref 0.61–1.24)
GFR calc Af Amer: >60 mL/min (ref >60)
GFR calc non Af Amer: >60 mL/min (ref >60)
Glucose, Bld: 126 mg/dL — ABNORMAL HIGH (ref 65–99)
Potassium: 3.7 mmol/L (ref 3.5–5.1)
Sodium: 137 mmol/L (ref 135–145)
Total Bilirubin: 0.9 mg/dL (ref 0.3–1.2)
Total Protein: 7.2 g/dL (ref 6.5–8.1)

## 2016-06-25 LAB — CBC WITH DIFFERENTIAL/PLATELET
Basophils Absolute: 0.1 10*3/uL (ref 0.0–0.1)
Basophils Relative: 1 %
Eosinophils Absolute: 0.2 10*3/uL (ref 0.0–0.7)
Eosinophils Relative: 2 %
HCT: 38.9 % — ABNORMAL LOW (ref 39.0–52.0)
Hemoglobin: 13.3 g/dL (ref 13.0–17.0)
Lymphocytes Relative: 31 %
Lymphs Abs: 2.8 10*3/uL (ref 0.7–4.0)
MCH: 30.7 pg (ref 26.0–34.0)
MCHC: 34.2 g/dL (ref 30.0–36.0)
MCV: 89.8 fL (ref 78.0–100.0)
Monocytes Absolute: 0.8 10*3/uL (ref 0.1–1.0)
Monocytes Relative: 9 %
Neutro Abs: 5.2 10*3/uL (ref 1.7–7.7)
Neutrophils Relative %: 57 %
Platelets: 364 10*3/uL (ref 150–400)
RBC: 4.33 MIL/uL (ref 4.22–5.81)
RDW: 12.9 % (ref 11.5–15.5)
WBC: 9.1 10*3/uL (ref 4.0–10.5)

## 2016-06-25 LAB — BRAIN NATRIURETIC PEPTIDE: B Natriuretic Peptide: 6.9 pg/mL (ref 0.0–100.0)

## 2016-06-25 LAB — MAGNESIUM: Magnesium: 1.7 mg/dL (ref 1.7–2.4)

## 2016-06-25 LAB — TSH: TSH: 1.264 u[IU]/mL (ref 0.350–4.500)

## 2016-06-25 LAB — GLUCOSE, CAPILLARY: Glucose-Capillary: 182 mg/dL — ABNORMAL HIGH (ref 65–99)

## 2016-06-25 LAB — TROPONIN I: Troponin I: <0.03 ng/mL (ref <0.03)

## 2016-06-25 LAB — I-STAT TROPONIN, ED: Troponin i, poc: 0 ng/mL (ref 0.00–0.08)

## 2016-06-25 LAB — CBG MONITORING, ED: Glucose-Capillary: 135 mg/dL — ABNORMAL HIGH (ref 65–99)

## 2016-06-25 MED ORDER — LOSARTAN POTASSIUM-HCTZ 50-12.5 MG PO TABS: 1.0000 | ORAL_TABLET | Freq: Every day | ORAL | Status: DC

## 2016-06-25 MED ORDER — ACETAMINOPHEN 500 MG PO TABS
1000.0000 mg | ORAL_TABLET | Freq: Four times a day (QID) | ORAL | Status: DC | PRN
  Administered 2016-06-28: 04:00:00 1000 mg via ORAL
  Filled 2016-06-25: qty 2

## 2016-06-25 MED ORDER — SODIUM CHLORIDE 0.9% FLUSH
3.0000 mL | Freq: Two times a day (BID) | INTRAVENOUS | Status: DC
  Administered 2016-06-25 – 2016-06-26 (×2): 3 mL via INTRAVENOUS

## 2016-06-25 MED ORDER — HYDROCHLOROTHIAZIDE 12.5 MG PO CAPS
12.5000 mg | ORAL_CAPSULE | Freq: Every day | ORAL | Status: DC
  Administered 2016-06-25 – 2016-06-28 (×3): 12.5 mg via ORAL
  Filled 2016-06-25 (×3): qty 1

## 2016-06-25 MED ORDER — MAGNESIUM SULFATE 2 GM/50ML IV SOLN
2.0000 g | Freq: Once | INTRAVENOUS | Status: AC
Start: 2016-06-25 — End: 2016-06-25
  Administered 2016-06-25: 2 g via INTRAVENOUS
  Filled 2016-06-25: qty 50

## 2016-06-25 MED ORDER — DULOXETINE HCL 30 MG PO CPEP
30.0000 mg | ORAL_CAPSULE | Freq: Every day | ORAL | Status: DC
  Administered 2016-06-25 – 2016-06-28 (×4): 30 mg via ORAL
  Filled 2016-06-25 (×4): qty 1

## 2016-06-25 MED ORDER — FLUOXETINE HCL 20 MG PO CAPS
40.0000 mg | ORAL_CAPSULE | Freq: Every day | ORAL | Status: DC
  Administered 2016-06-25 – 2016-06-28 (×4): 40 mg via ORAL
  Filled 2016-06-25 (×5): qty 2

## 2016-06-25 MED ORDER — FAMOTIDINE 20 MG PO TABS
20.0000 mg | ORAL_TABLET | Freq: Two times a day (BID) | ORAL | Status: DC
  Administered 2016-06-25 – 2016-06-28 (×5): 20 mg via ORAL
  Filled 2016-06-25 (×5): qty 1

## 2016-06-25 MED ORDER — NITROGLYCERIN 0.4 MG SL SUBL
0.4000 mg | SUBLINGUAL_TABLET | SUBLINGUAL | Status: DC | PRN
  Administered 2016-06-26 (×3): 0.4 mg via SUBLINGUAL
  Filled 2016-06-25 (×2): qty 1

## 2016-06-25 MED ORDER — RIVAROXABAN 20 MG PO TABS: 20.0000 mg | ORAL_TABLET | Freq: Every day | ORAL | Status: DC

## 2016-06-25 MED ORDER — KETOROLAC TROMETHAMINE 30 MG/ML IJ SOLN
30.0000 mg | Freq: Once | INTRAMUSCULAR | Status: AC
  Administered 2016-06-25: 30 mg via INTRAVENOUS
  Filled 2016-06-25: qty 1

## 2016-06-25 MED ORDER — LINAGLIPTIN 5 MG PO TABS
5.0000 mg | ORAL_TABLET | Freq: Every day | ORAL | Status: DC
  Administered 2016-06-28: 09:00:00 5 mg via ORAL
  Filled 2016-06-25: qty 1

## 2016-06-25 MED ORDER — SIMVASTATIN 20 MG PO TABS
40.0000 mg | ORAL_TABLET | Freq: Every day | ORAL | Status: DC
  Administered 2016-06-25 – 2016-06-27 (×3): 40 mg via ORAL
  Filled 2016-06-25: qty 1
  Filled 2016-06-25: qty 2
  Filled 2016-06-25: qty 1

## 2016-06-25 MED ORDER — ONDANSETRON HCL 4 MG/2ML IJ SOLN: 4.0000 mg | Freq: Four times a day (QID) | INTRAMUSCULAR | Status: DC | PRN

## 2016-06-25 MED ORDER — LOSARTAN POTASSIUM 50 MG PO TABS
50.0000 mg | ORAL_TABLET | Freq: Every day | ORAL | Status: DC
  Administered 2016-06-25 – 2016-06-28 (×3): 50 mg via ORAL
  Filled 2016-06-25 (×3): qty 1

## 2016-06-25 MED ORDER — CANAGLIFLOZIN 300 MG PO TABS
300.0000 mg | ORAL_TABLET | Freq: Every day | ORAL | Status: DC
  Filled 2016-06-25: qty 1

## 2016-06-25 MED ORDER — DIAZEPAM 5 MG PO TABS
10.0000 mg | ORAL_TABLET | Freq: Three times a day (TID) | ORAL | Status: DC
  Administered 2016-06-25 – 2016-06-28 (×6): 10 mg via ORAL
  Filled 2016-06-25 (×7): qty 2

## 2016-06-25 MED ORDER — METFORMIN HCL ER 500 MG PO TB24: 1000.0000 mg | ORAL_TABLET | Freq: Two times a day (BID) | ORAL | Status: DC

## 2016-06-25 MED ORDER — ALBUTEROL SULFATE (2.5 MG/3ML) 0.083% IN NEBU
5.0000 mg | INHALATION_SOLUTION | Freq: Once | RESPIRATORY_TRACT | Status: DC
  Filled 2016-06-25: qty 6

## 2016-06-25 MED ORDER — ENOXAPARIN SODIUM 40 MG/0.4ML ~~LOC~~ SOLN: 40.0000 mg | SUBCUTANEOUS | Status: DC

## 2016-06-25 NOTE — ED Triage Notes (Signed)
To ED for eval of bilateral feet tingling, bilateral leg pain, and back pain. Also c/o increased sob. Seen by pcp 2/1 and started on new diabetic med. Appears in nad. Min to no swelling in legs.

## 2016-06-25 NOTE — ED Provider Notes (Signed)
[facility]-EMERGENCY DEPT Provider Note   CSN: [id number] Arrival date & time: 06/25/16  1212     History   Chief Complaint Chief Complaint  Patient presents with  . Shortness of Breath  . Tingling    HPI Xavier [name] is a 51 y.o. male.  HPI To ED for eval of bilateral feet tingling, bilateral leg pain, and back pain. Also c/o increased sob. Seen by pcp 2/1 and started on new diabetic med.   Patient has significant past medical history of coronary artery disease with a recent stress test in February 2017 which was only remarkable for insignificant ST depressions during stress but no angina. He also had an echo in 2015 with no valvular abnormalities and an EF of 60-65%. However, he states that he has baseline shortness of breath but over the last week that it's gotten worse so that he has difficulty walking from his room to the kitchen. He does have a history of PE as well and is on Xarelto has not missed any doses. Denies any leg swelling or pain. He also thinks he has CHF but is only 6 and prior echo does not suggest this on her records. He does wear CPAP but has never been totally has pulmonary hypertension.   Bilateral lower extremity occurred yesterday as well. He's been having this since early this month. Saw primary care provider who started him on the new medication, indication for diabetic related paresthesias and neuropathy. He does have some back pain but this has been present for years. He states it does seem to be slightly worse. Does not have a history cancer. No recent weight loss. He states the weakness in his lower extremities comes and goes and is worse with his pain. Additionally, he has RUE weakness and pain that appear to come together. No aphasia, other focal findings. Does have some neck pain. He has not had new episodes of incontinence or numbness in his perineum.  Patient has not had any infectious symptoms. No cough. Has been a smoker in the past but is no longer a  smoker. Has never been told that he has COPD.  Past Medical History:  Diagnosis Date  . ANEMIA, PERNICIOUS 04/03/2007  . Cataract   . CORONARY ARTERY DISEASE 12/05/2006  . DIABETES MELLITUS, TYPE II 12/05/2006  . HYPERCHOLESTEROLEMIA 07/17/2007  . HYPERTENSION 12/05/2006  . Learning disability   . PE (pulmonary embolism)   . VISUAL ACUITY, DECREASED, RIGHT EYE 02/19/2009    Patient Active Problem List   Diagnosis Date Noted  . Back pain 06/15/2016  . Screening examination for infectious disease 06/15/2016  . Obesity 03/29/2016  . OSA (obstructive sleep apnea) 09/06/2015  . Hypersomnia with sleep apnea 07/08/2015  . RLS (restless legs syndrome) 07/08/2015  . Cramp of both lower extremities 06/23/2015  . Personal history of DVT (deep vein thrombosis) 06/23/2015  . History of snoring 06/23/2015  . Excessive somnolence disorder 06/23/2015  . Acute pulmonary embolism (HCC) 06/23/2015  . Obesity, morbid (HCC) 12/25/2014  . Cellulitis 12/17/2013  . 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 Depressive disorder, not elsewhere classified 12/03/2013  . Pulmonary embolism (HCC) 11/26/2013  . Screening for prostate cancer 09/24/2012  . Encounter for long-term (current) use of other medications 09/24/2012  . Abnormal breath sounds 02/13/2011  . Visual loss 02/19/2009  . HYPERCHOLESTEROLEMIA 07/17/2007  . ANEMIA, PERNICIOUS 04/03/2007  . Diabetes (HCC) 12/05/2006  . Essential hypertension 12/05/2006  . Coronary atherosclerosis 12/05/2006  Past Surgical History:  Procedure Laterality Date  . EYE SURGERY    . Repair Left hand    . triple hernia repair         Home Medications    Prior to Admission medications   Medication Sig Start Date End Date Taking? Authorizing Provider  acetaminophen (TYLENOL) 500 MG tablet Take 1,000 mg by mouth every 6 (six) hours as needed.   Yes Historical Provider, MD  canagliflozin (INVOKANA) 300 MG TABS tablet  Take 1 tablet (300 mg total) by mouth daily before breakfast. 06/15/16  Yes Romero BellingSean Ellison, MD  cyanocobalamin (,VITAMIN B-12,) 1000 MCG/ML injection Inject 1,000 mcg into the muscle every 30 (thirty) days. Reported on 06/23/2015   Yes Historical Provider, MD  diazepam (VALIUM) 10 MG tablet Take 10 mg by mouth 3 (three) times daily. 05/23/16  Yes Historical Provider, MD  DULoxetine (CYMBALTA) 30 MG capsule Take 30 mg by mouth daily.     Yes Historical Provider, MD  EPINEPHrine (EPIPEN 2-PAK) 0.3 mg/0.3 mL IJ SOAJ injection Inject 0.3 mLs (0.3 mg total) into the muscle once. 12/01/15  Yes Romero BellingSean Ellison, MD  FLUoxetine (PROZAC) 40 MG capsule Take 40 mg by mouth daily.     Yes Historical Provider, MD  JANUVIA 100 MG tablet TAKE 1 TABLET BY MOUTH EVERY DAY 03/16/16  Yes Romero BellingSean Ellison, MD  losartan-hydrochlorothiazide (HYZAAR) 50-12.5 MG tablet Take 1 tablet by mouth daily. 07/26/15  Yes Romero BellingSean Ellison, MD  metFORMIN (GLUCOPHAGE-XR) 500 MG 24 hr tablet TAKE 2 TABLETS BY MOUTH TWICE A DAY 01/17/16  Yes Romero BellingSean Ellison, MD  rivaroxaban (XARELTO) 20 MG TABS tablet TAKE 1 TABLET BY MOUTH EVERY DAY BEFORE SUPPER 04/14/16  Yes Jose Angelo A Christene Slatesde Dios, MD  simvastatin (ZOCOR) 40 MG tablet TAKE 1 TABLET (40 MG TOTAL) BY MOUTH AT BEDTIME. 06/18/16  Yes Romero BellingSean Ellison, MD    Family History Family History  Problem Relation Age of Onset  . COPD Mother   . Lymphoma Father     Social History Social History  Substance Use Topics  . Smoking status: Former Smoker    Packs/day: 0.10    Years: 3.00    Types: Cigarettes    Quit date: 05/15/1984  . Smokeless tobacco: Never Used  . Alcohol use Yes     Comment: Occassional     Allergies   Actos [pioglitazone]; Bee venom; Klonopin [clonazepam]; and Niacin   Review of Systems Review of Systems  Constitutional: Negative for fever.  Allergic/Immunologic: Negative for immunocompromised state.  All other systems reviewed and are negative.    Physical Exam Updated Vital Signs BP  153/93 (BP Location: Right Arm)   Pulse 90   Temp 97.5 F (36.4 C) (Oral)   Resp 17   Ht 6' (1.829 m)   Wt 125.2 kg   SpO2 96%   BMI 37.43 kg/m   Physical Exam  Constitutional: He is oriented to person, place, and time. He appears well-developed and well-nourished. No distress.  HENT:  Head: Normocephalic and atraumatic.  Left Ear: External ear normal.  Eyes: Conjunctivae and EOM are normal. Pupils are equal, round, and reactive to light. Right eye exhibits no discharge. Left eye exhibits no discharge.  Neck: Normal range of motion. Neck supple.  Cardiovascular: Normal rate and regular rhythm.   No murmur heard. Pulmonary/Chest: Effort normal and breath sounds normal. No respiratory distress. He has no wheezes. He has no rales. He exhibits no tenderness.  Abdominal: Soft. Bowel sounds are normal. He exhibits  no distension and no mass. There is no tenderness. There is no rebound and no guarding.  Musculoskeletal: He exhibits no edema (large calves but no pitting edema, asymmetry, or calf tenderness).  Neurological: He is alert and oriented to person, place, and time. He has normal strength and normal reflexes. He displays normal reflexes. No cranial nerve deficit or sensory deficit. He exhibits normal muscle tone. He displays a negative Romberg sign. He displays no seizure activity. Gait normal.  Skin: Skin is warm. Capillary refill takes less than 2 seconds. He is not diaphoretic.  Psychiatric: He has a normal mood and affect.   ED Treatments / Results  Labs (all labs ordered are listed, but only abnormal results are displayed) Labs Reviewed  COMPREHENSIVE METABOLIC PANEL - Abnormal; Notable for the following:       Result Value   Chloride 100 (*)    Glucose, Bld 126 (*)    All other components within normal limits  CBC WITH DIFFERENTIAL/PLATELET - Abnormal; Notable for the following:    HCT 38.9 (*)    All other components within normal limits  CBG MONITORING, ED - Abnormal;  Notable for the following:    Glucose-Capillary 135 (*)    All other components within normal limits  BRAIN NATRIURETIC PEPTIDE  TSH  MAGNESIUM  I-STAT TROPOININ, ED    EKG  EKG Interpretation  Date/Time:  Sunday June 25 2016 12:49:34 EST Ventricular Rate:  89 PR Interval:  174 QRS Duration: 68 QT Interval:  300 QTC Calculation: 365 R Axis:   -64 Text Interpretation:  Normal sinus rhythm Left axis deviation Pulmonary disease pattern ST & T wave abnormality, consider inferolateral ischemia Abnormal ECG slight ST depression as compared to prior EKG Confirmed by BELFI  MD, MELANIE ([id number]) on 06/25/2016 3:25:27 PM       Radiology Dg Chest 2 View  Result Date: 06/25/2016 CLINICAL DATA:  Shortness of breath. EXAM: CHEST  2 VIEW COMPARISON:  November 25, 2013 FINDINGS: The heart size and mediastinal contours are within normal limits. Both lungs are clear. The visualized skeletal structures are unremarkable. IMPRESSION: No active cardiopulmonary disease. Electronically Signed   By: Gerome Sam III M.D   On: 06/25/2016 13:30    Procedures Procedures (including critical care time)  Medications Ordered in ED Medications - No data to display   Initial Impression / Assessment and Plan / ED Course  I have reviewed the triage vital signs and the nursing notes.  Pertinent labs & imaging results that were available during my care of the patient were reviewed by me and considered in my medical decision making (see chart for details).    EKG with normal sinus rhythm, T-wave inversions. STD appear worse in I, II, V5, V6.   Regarding his paresthesias, these may be related to diabetes related neuropathy. However, he currently denies any numbness in his lower extremities at the soles. He does have the numbness that may be related to a herniated disc at the back as well as the neck given the right sided arm and lower extremity numbness. He does not have any objective findings on exam today.  Good pulses distally. Neurological exam otherwise negative and out TIA or stroke. No indication for emergent MRI or CT head. Doubt cauda equina.  However, patient is a high here score given history of coronary artery disease and EKG changes. With the history of worsening shortness of breath with exertion, troponin obtained, initial was negative. Doubt PE as on Xeralto. Suspect underlying  lung pathology given CPAP, EKG, obesity; however, with ACS  Hx and intermittent CP and EKG changes, will admit patient for chest pain rule out.   Final Clinical Impressions(s) / ED Diagnoses   Final diagnoses:  Dyspnea, unspecified type    New Prescriptions New Prescriptions   No medications on file     Sidney Ace, MD 06/25/16 1758    Rolan Bucco, MD 06/25/16 1610

## 2016-06-25 NOTE — ED Notes (Signed)
ED Provider at bedside. 

## 2016-06-25 NOTE — H&P (Addendum)
History and Physical    Xavier AoBobby E Overholt WGN:[id number] DOB: 05-Jun-1965 DOA: 06/25/2016  PCP: Romero BellingELLISON, SEAN, MD   Patient coming from: Home.  Chief Complaint: Chest pressure and dyspnea.  HPI: Xavier [name] is a 51 y.o. male with medical history significant of pernicious anemia, cataract of the right eye, coronary artery disease (treated medically), history of MI, hyperlipidemia, hypertension, DVT, history of pulmonary embolism, chronic back pain, sleep apnea on CPAP who comes to the emergency department with complaints of progressively worse dyspnea on exertion and decreased exercise capacity, to the point in the walking between rooms at home triggers symptoms, for one month. The patient also states he has been having chest pressure, nonradiating, associated with mild dizziness, diaphoresis, palpitations and panic attacks which usually gets better with rest. He denies nausea, emesis, productive cough or wheezing. He denies orthopnea, PND, but gets occasional lower extremity edema. He also complains of worsening lower back pain with occasional tingling and numbness of lower extremities.  He denies abdominal pain, nausea, emesis, melena, hematochezia, diarrhea or constipation. He denies GU symptoms.  ED Course: The patient's EKG showed mild ST and T wave abnormalities on lateral leads. His first troponin was negative, BNP was 6.9 ng/mL. CBC and CMP, except for non-fasting of glucose 126 mg/dL were unremarkable. A chest radiograph did not show any acute pathologies.  Review of Systems: As per HPI otherwise 10 point review of systems negative.    Past Medical History:  Diagnosis Date  . ANEMIA, PERNICIOUS 04/03/2007  . Cataract   . CORONARY ARTERY DISEASE 12/05/2006  . DIABETES MELLITUS, TYPE II 12/05/2006  . HYPERCHOLESTEROLEMIA 07/17/2007  . HYPERTENSION 12/05/2006  . Learning disability   . PE (pulmonary embolism)   . VISUAL ACUITY, DECREASED, RIGHT EYE 02/19/2009    Past Surgical History:    Procedure Laterality Date  . EYE SURGERY Right    Cataract  . Repair Left hand    . triple hernia repair       reports that he quit smoking about 32 years ago. His smoking use included Cigarettes. He has a 0.30 pack-year smoking history. He has never used smokeless tobacco. He reports that he drinks alcohol. He reports that he does not use drugs.  Allergies  Allergen Reactions  . Actos [Pioglitazone] Other (See Comments)    Potential cause of DVT  . Bee Venom Anaphylaxis  . Klonopin [Clonazepam] Anaphylaxis  . Niacin Other (See Comments)    "makes his crazy"    Family History  Problem Relation Age of Onset  . COPD Mother   . Lymphoma Father      Prior to Admission medications   Medication Sig Start Date End Date Taking? Authorizing Provider  acetaminophen (TYLENOL) 500 MG tablet Take 1,000 mg by mouth every 6 (six) hours as needed.   Yes Historical Provider, MD  canagliflozin (INVOKANA) 300 MG TABS tablet Take 1 tablet (300 mg total) by mouth daily before breakfast. 06/15/16  Yes Romero BellingSean Ellison, MD  cyanocobalamin (,VITAMIN B-12,) 1000 MCG/ML injection Inject 1,000 mcg into the muscle every 30 (thirty) days. Reported on 06/23/2015   Yes Historical Provider, MD  diazepam (VALIUM) 10 MG tablet Take 10 mg by mouth 3 (three) times daily. 05/23/16  Yes Historical Provider, MD  DULoxetine (CYMBALTA) 30 MG capsule Take 30 mg by mouth daily.     Yes Historical Provider, MD  EPINEPHrine (EPIPEN 2-PAK) 0.3 mg/0.3 mL IJ SOAJ injection Inject 0.3 mLs (0.3 mg total) into the muscle once. 12/01/15  Yes Romero Belling, MD  FLUoxetine (PROZAC) 40 MG capsule Take 40 mg by mouth daily.     Yes Historical Provider, MD  JANUVIA 100 MG tablet TAKE 1 TABLET BY MOUTH EVERY DAY 03/16/16  Yes Romero Belling, MD  losartan-hydrochlorothiazide (HYZAAR) 50-12.5 MG tablet Take 1 tablet by mouth daily. 07/26/15  Yes Romero Belling, MD  metFORMIN (GLUCOPHAGE-XR) 500 MG 24 hr tablet TAKE 2 TABLETS BY MOUTH TWICE A DAY 01/17/16   Yes Romero Belling, MD  rivaroxaban (XARELTO) 20 MG TABS tablet TAKE 1 TABLET BY MOUTH EVERY DAY BEFORE SUPPER 04/14/16  Yes Jose Alexis Frock, MD  simvastatin (ZOCOR) 40 MG tablet TAKE 1 TABLET (40 MG TOTAL) BY MOUTH AT BEDTIME. 06/18/16  Yes Romero Belling, MD    Physical Exam: Constitutional: NAD, calm, comfortable Vitals:   06/25/16 1630 06/25/16 1715 06/25/16 1745 06/25/16 1830  BP: 153/79 151/83 144/83 142/94  Pulse: 77 78 70 75  Resp: 12 20 15 17   Temp:      TempSrc:      SpO2: 98% 96% 97% 98%  Weight:      Height:       Eyes: PERRL, lids and conjunctivae normal ENMT: Mucous membranes are moist. Posterior pharynx clear of any exudate or lesions.Several dental cavities and fractured right upper premolar tooth. Neck: normal, supple, no masses, no thyromegaly Respiratory: clear to auscultation bilaterally, no wheezing, no crackles. Normal respiratory effort. No accessory muscle use.  Cardiovascular: Regular rate and rhythm, no murmurs / rubs / gallops. Trace lower extremity edema. 2+ pedal pulses. No carotid bruits.  Abdomen: Soft, no tenderness, no masses palpated. No hepatosplenomegaly. Bowel sounds positive.  Musculoskeletal: no clubbing / cyanosis. Positive lumbar sacral area paraspinal muscle tenderness. Good ROM, no contractures. Normal muscle tone.  Skin: no rashes, lesions, ulcers. No induration Neurologic: CN 2-12 grossly intact. Sensation intact, DTR normal. Strength 5/5 in all 4.  Psychiatric: Normal judgment and insight. Alert and oriented x 3. Normal mood.    Labs on Admission: I have personally reviewed following labs and imaging studies  CBC:  Recent Labs Lab 06/25/16 1552  WBC 9.1  NEUTROABS 5.2  HGB 13.3  HCT 38.9*  MCV 89.8  PLT 364   Basic Metabolic Panel:  Recent Labs Lab 06/25/16 1552  NA 137  K 3.7  CL 100*  CO2 26  GLUCOSE 126*  BUN 14  CREATININE 0.98  CALCIUM 9.5  MG 1.7   GFR: Estimated Creatinine Clearance: 123.2 mL/min (by C-G  formula based on SCr of 0.98 mg/dL). Liver Function Tests:  Recent Labs Lab 06/25/16 1552  AST 19  ALT 24  ALKPHOS 52  BILITOT 0.9  PROT 7.2  ALBUMIN 4.1   No results for input(s): LIPASE, AMYLASE in the last 168 hours. No results for input(s): AMMONIA in the last 168 hours. Coagulation Profile: No results for input(s): INR, PROTIME in the last 168 hours. Cardiac Enzymes: No results for input(s): CKTOTAL, CKMB, CKMBINDEX, TROPONINI in the last 168 hours. BNP (last 3 results) No results for input(s): PROBNP in the last 8760 hours. HbA1C: No results for input(s): HGBA1C in the last 72 hours. CBG:  Recent Labs Lab 06/25/16 1446  GLUCAP 135*   Lipid Profile: No results for input(s): CHOL, HDL, LDLCALC, TRIG, CHOLHDL, LDLDIRECT in the last 72 hours. Thyroid Function Tests:  Recent Labs  06/25/16 1552  TSH 1.264   Anemia Panel: No results for input(s): VITAMINB12, FOLATE, FERRITIN, TIBC, IRON, RETICCTPCT in the last 72 hours.  Urine analysis:    Component Value Date/Time   COLORURINE YELLOW 06/15/2016 1105   APPEARANCEUR CLEAR 06/15/2016 1105   LABSPEC 1.025 06/15/2016 1105   PHURINE 5.5 06/15/2016 1105   GLUCOSEU NEGATIVE 06/15/2016 1105   HGBUR NEGATIVE 06/15/2016 1105   BILIRUBINUR NEGATIVE 06/15/2016 1105   KETONESUR NEGATIVE 06/15/2016 1105   PROTEINUR NEGATIVE 04/12/2007 0515   UROBILINOGEN 0.2 06/15/2016 1105   NITRITE NEGATIVE 06/15/2016 1105   LEUKOCYTESUR NEGATIVE 06/15/2016 1105     Radiological Exams on Admission: Dg Chest 2 View  Result Date: 06/25/2016 CLINICAL DATA:  Shortness of breath. EXAM: CHEST  2 VIEW COMPARISON:  November 25, 2013 FINDINGS: The heart size and mediastinal contours are within normal limits. Both lungs are clear. The visualized skeletal structures are unremarkable. IMPRESSION: No active cardiopulmonary disease. Electronically Signed   By: Gerome Sam III M.D   On: 06/25/2016 13:30   Echocardiogram with contrast 11/26/2013    ------------------------------------------------------------------- LV EF: 60% -  65%  ------------------------------------------------------------------- Indications:   Dyspnea 786.09. Pulmonary embolus 415.19.  ------------------------------------------------------------------- History:  PMH:  Chest pain. Coronary artery disease. Risk factors: Former tobacco use. Hypertension. Diabetes mellitus. Dyslipidemia.  ------------------------------------------------------------------- Study Conclusions  - Left ventricle: The cavity size was normal. Wall thickness was normal. Systolic function was normal. The estimated ejection fraction was in the range of 60% to 65%. Wall motion was normal; there were no regional wall motion abnormalities.  Exercise tolerance test 07/03/2015   Study Highlights   1. Blood pressure demonstrated a hypertensive response to exercise. 2. Upsloping ST segment depression ST segment depression was noted during stress.    Fair exercise capacity.  No chest pain.    Marked hypertensive response to exercise.  There was insignificant up-sloping ST depression at peak exercise.  No changes to suggest ischemia.  Recommend patient FU with PCP for management of HTN.  FU with Everardo All, SEAN as planned. Signed,  Tereso Newcomer, PA-C   06/23/2015 11:23 AM        EKG: Independently reviewed. Vent. rate 89 BPM PR interval 174 ms QRS duration 68 ms QT/QTc 300/365 ms P-R-T axes 73 -64 136 Normal sinus rhythm Left axis deviation Pulmonary disease pattern ST & T wave abnormality, consider inferolateral ischemia Abnormal ECG Mildly worsened ST depression when compared to previous.   Assessment/Plan Principal Problem:   Chest pain Admit to telemetry/observation. Trend troponin levels. Supplemental oxygen. Continue CPAP at bedtime. Sublingual nitroglycerin as needed. The patient is on Xarelto due to DVT and PE history. Echo dobutamine stress  test in a.m. if biomarkers are within normal range.  Active Problems:   Coronary atherosclerosis Continue Xarelto and simvastatin.    HYPERCHOLESTEROLEMIA On tinea simvastatin 40 mg by mouth daily. Interval monitoring of hepatic panel function and fasting lipids.    ANEMIA, PERNICIOUS Hemoglobin within normal range. Continue monthly IM vitamin B12 supplementation.    Essential hypertension Continue Hyzaar 50-12 0.5 mg by mouth daily. Monitor blood pressure.    Personal history of DVT (deep vein thrombosis)   Pulmonary embolism (HCC) Continue Xarelto.    Depressive disorder Continue duloxetine 30 mg by mouth daily. Continue fluoxetine 40 mg by mouth daily       DVT prophylaxis: On Xarelto. Code Status: Full code. Family Communication: His wife Jarret Torre was present in the room.[phone] Disposition Plan: Admit to telemetry, troponin levels trending and dobutamine echo stress test in a.m. Consults called:  Admission status: Observation/telemetry.   Bobette Mo, MD [facility] Pager [phone].  If 7PM-7AM,  please contact night-coverage [email or web address] Password [facility]  06/25/2016, 7:24 PM

## 2016-06-26 ENCOUNTER — Observation Stay (HOSPITAL_COMMUNITY): Payer: Medicare Other

## 2016-06-26 ENCOUNTER — Telehealth: Payer: Self-pay | Admitting: Endocrinology

## 2016-06-26 ENCOUNTER — Encounter (HOSPITAL_COMMUNITY): Payer: Self-pay | Admitting: General Practice

## 2016-06-26 ENCOUNTER — Telehealth: Payer: Self-pay

## 2016-06-26 DIAGNOSIS — G4733 Obstructive sleep apnea (adult) (pediatric): Secondary | ICD-10-CM | POA: Diagnosis present

## 2016-06-26 DIAGNOSIS — K029 Dental caries, unspecified: Secondary | ICD-10-CM | POA: Diagnosis present

## 2016-06-26 DIAGNOSIS — Z91030 Bee allergy status: Secondary | ICD-10-CM | POA: Diagnosis not present

## 2016-06-26 DIAGNOSIS — Z6837 Body mass index (BMI) 37.0-37.9, adult: Secondary | ICD-10-CM | POA: Diagnosis not present

## 2016-06-26 DIAGNOSIS — R079 Chest pain, unspecified: Secondary | ICD-10-CM

## 2016-06-26 DIAGNOSIS — R0600 Dyspnea, unspecified: Secondary | ICD-10-CM | POA: Diagnosis not present

## 2016-06-26 DIAGNOSIS — Z7984 Long term (current) use of oral hypoglycemic drugs: Secondary | ICD-10-CM | POA: Diagnosis not present

## 2016-06-26 DIAGNOSIS — Z87891 Personal history of nicotine dependence: Secondary | ICD-10-CM | POA: Diagnosis not present

## 2016-06-26 DIAGNOSIS — R9439 Abnormal result of other cardiovascular function study: Secondary | ICD-10-CM | POA: Diagnosis not present

## 2016-06-26 DIAGNOSIS — Z79899 Other long term (current) drug therapy: Secondary | ICD-10-CM | POA: Diagnosis not present

## 2016-06-26 DIAGNOSIS — E7800 Pure hypercholesterolemia, unspecified: Secondary | ICD-10-CM | POA: Diagnosis present

## 2016-06-26 DIAGNOSIS — E119 Type 2 diabetes mellitus without complications: Secondary | ICD-10-CM | POA: Diagnosis not present

## 2016-06-26 DIAGNOSIS — I493 Ventricular premature depolarization: Secondary | ICD-10-CM | POA: Diagnosis present

## 2016-06-26 DIAGNOSIS — I10 Essential (primary) hypertension: Secondary | ICD-10-CM | POA: Diagnosis not present

## 2016-06-26 DIAGNOSIS — R0789 Other chest pain: Secondary | ICD-10-CM | POA: Diagnosis not present

## 2016-06-26 DIAGNOSIS — G8929 Other chronic pain: Secondary | ICD-10-CM | POA: Diagnosis not present

## 2016-06-26 DIAGNOSIS — I200 Unstable angina: Secondary | ICD-10-CM | POA: Diagnosis not present

## 2016-06-26 DIAGNOSIS — F329 Major depressive disorder, single episode, unspecified: Secondary | ICD-10-CM | POA: Diagnosis not present

## 2016-06-26 DIAGNOSIS — Z7901 Long term (current) use of anticoagulants: Secondary | ICD-10-CM | POA: Diagnosis not present

## 2016-06-26 DIAGNOSIS — Z86718 Personal history of other venous thrombosis and embolism: Secondary | ICD-10-CM | POA: Diagnosis not present

## 2016-06-26 DIAGNOSIS — G4730 Sleep apnea, unspecified: Secondary | ICD-10-CM | POA: Diagnosis present

## 2016-06-26 DIAGNOSIS — Z86711 Personal history of pulmonary embolism: Secondary | ICD-10-CM | POA: Diagnosis not present

## 2016-06-26 DIAGNOSIS — E1140 Type 2 diabetes mellitus with diabetic neuropathy, unspecified: Secondary | ICD-10-CM | POA: Diagnosis present

## 2016-06-26 DIAGNOSIS — D510 Vitamin B12 deficiency anemia due to intrinsic factor deficiency: Secondary | ICD-10-CM | POA: Diagnosis present

## 2016-06-26 DIAGNOSIS — E785 Hyperlipidemia, unspecified: Secondary | ICD-10-CM | POA: Diagnosis present

## 2016-06-26 DIAGNOSIS — I25110 Atherosclerotic heart disease of native coronary artery with unstable angina pectoris: Secondary | ICD-10-CM | POA: Diagnosis not present

## 2016-06-26 DIAGNOSIS — Z888 Allergy status to other drugs, medicaments and biological substances status: Secondary | ICD-10-CM | POA: Diagnosis not present

## 2016-06-26 DIAGNOSIS — M545 Low back pain: Secondary | ICD-10-CM | POA: Diagnosis present

## 2016-06-26 DIAGNOSIS — I2699 Other pulmonary embolism without acute cor pulmonale: Secondary | ICD-10-CM | POA: Diagnosis not present

## 2016-06-26 DIAGNOSIS — I252 Old myocardial infarction: Secondary | ICD-10-CM | POA: Diagnosis not present

## 2016-06-26 LAB — BASIC METABOLIC PANEL
Anion gap: 10 (ref 5–15)
Anion gap: 10 (ref 5–15)
BUN: 15 mg/dL (ref 6–20)
BUN: 18 mg/dL (ref 6–20)
CO2: 25 mmol/L (ref 22–32)
CO2: 25 mmol/L (ref 22–32)
Calcium: 9.1 mg/dL (ref 8.9–10.3)
Calcium: 9.1 mg/dL (ref 8.9–10.3)
Chloride: 100 mmol/L — ABNORMAL LOW (ref 101–111)
Chloride: 100 mmol/L — ABNORMAL LOW (ref 101–111)
Creatinine, Ser: 1.11 mg/dL (ref 0.61–1.24)
Creatinine, Ser: 1.06 mg/dL (ref 0.61–1.24)
GFR calc Af Amer: >60 mL/min (ref >60)
GFR calc Af Amer: >60 mL/min (ref >60)
GFR calc non Af Amer: >60 mL/min (ref >60)
GFR calc non Af Amer: >60 mL/min (ref >60)
Glucose, Bld: 160 mg/dL — ABNORMAL HIGH (ref 65–99)
Glucose, Bld: 147 mg/dL — ABNORMAL HIGH (ref 65–99)
Potassium: 3.5 mmol/L (ref 3.5–5.1)
Potassium: 4 mmol/L (ref 3.5–5.1)
Sodium: 135 mmol/L (ref 135–145)
Sodium: 135 mmol/L (ref 135–145)

## 2016-06-26 LAB — GLUCOSE, CAPILLARY
Glucose-Capillary: 185 mg/dL — ABNORMAL HIGH (ref 65–99)
Glucose-Capillary: 158 mg/dL — ABNORMAL HIGH (ref 65–99)
Glucose-Capillary: 209 mg/dL — ABNORMAL HIGH (ref 65–99)

## 2016-06-26 LAB — TROPONIN I
Troponin I: <0.03 ng/mL (ref <0.03)
Troponin I: <0.03 ng/mL (ref <0.03)
Troponin I: 0.93 ng/mL (ref <0.03)

## 2016-06-26 LAB — PROTIME-INR
INR: 1.07
Prothrombin Time: 13.9 seconds (ref 11.4–15.2)

## 2016-06-26 MED ORDER — INSULIN ASPART 100 UNIT/ML ~~LOC~~ SOLN
0.0000 [IU] | Freq: Three times a day (TID) | SUBCUTANEOUS | Status: DC
  Administered 2016-06-27: 3 [IU] via SUBCUTANEOUS
  Administered 2016-06-27: 2 [IU] via SUBCUTANEOUS
  Administered 2016-06-28: 3 [IU] via SUBCUTANEOUS

## 2016-06-26 MED ORDER — SODIUM CHLORIDE 0.9 % IV SOLN: 250.0000 mL | INTRAVENOUS | Status: DC | PRN

## 2016-06-26 MED ORDER — SODIUM CHLORIDE 0.9 % WEIGHT BASED INFUSION
1.0000 mL/kg/h | INTRAVENOUS | Status: DC
  Administered 2016-06-27 (×2): 1 mL/kg/h via INTRAVENOUS

## 2016-06-26 MED ORDER — SODIUM CHLORIDE 0.9% FLUSH: 3.0000 mL | INTRAVENOUS | Status: DC | PRN

## 2016-06-26 MED ORDER — DOBUTAMINE IN D5W 4-5 MG/ML-% IV SOLN
2.5000 ug/kg/min | Freq: Once | INTRAVENOUS | Status: AC
  Administered 2016-06-26: 10 ug/kg/min via INTRAVENOUS
  Filled 2016-06-26: qty 250

## 2016-06-26 MED ORDER — ASPIRIN 81 MG PO CHEW
CHEWABLE_TABLET | ORAL | Status: AC
  Administered 2016-06-26: 81 mg
  Filled 2016-06-26: qty 1

## 2016-06-26 MED ORDER — SODIUM CHLORIDE 0.9% FLUSH: 3.0000 mL | Freq: Two times a day (BID) | INTRAVENOUS | Status: DC

## 2016-06-26 MED ORDER — SODIUM CHLORIDE 0.9 % IV SOLN
INTRAVENOUS | Status: DC
  Administered 2016-06-26 (×2): via INTRAVENOUS

## 2016-06-26 MED ORDER — SODIUM CHLORIDE 0.9 % WEIGHT BASED INFUSION
3.0000 mL/kg/h | INTRAVENOUS | Status: DC
  Administered 2016-06-27: 3 mL/kg/h via INTRAVENOUS

## 2016-06-26 MED ORDER — ATROPINE SULFATE 1 MG/10ML IJ SOSY
1.0000 mg | PREFILLED_SYRINGE | Freq: Once | INTRAMUSCULAR | Status: DC
  Filled 2016-06-26: qty 10

## 2016-06-26 MED ORDER — NITROGLYCERIN IN D5W 200-5 MCG/ML-% IV SOLN
2.0000 ug/min | INTRAVENOUS | Status: DC
  Administered 2016-06-26: 5 ug/min via INTRAVENOUS
  Filled 2016-06-26: qty 250

## 2016-06-26 MED ORDER — DOBUTAMINE INFUSION FOR EP/ECHO/NUC (1000 MCG/ML)
0.0000 ug/kg/min | INTRAVENOUS | Status: DC
  Filled 2016-06-26: qty 250

## 2016-06-26 MED ORDER — ASPIRIN 81 MG PO CHEW
324.0000 mg | CHEWABLE_TABLET | Freq: Once | ORAL | Status: AC
  Administered 2016-06-26: 324 mg via ORAL
  Filled 2016-06-26: qty 4

## 2016-06-26 MED ORDER — KETOROLAC TROMETHAMINE 30 MG/ML IJ SOLN
30.0000 mg | Freq: Once | INTRAMUSCULAR | Status: AC
  Administered 2016-06-26: 30 mg via INTRAVENOUS
  Filled 2016-06-26: qty 1

## 2016-06-26 MED ORDER — MORPHINE SULFATE (PF) 2 MG/ML IV SOLN
2.0000 mg | INTRAVENOUS | Status: DC | PRN
  Administered 2016-06-26: 4 mg via INTRAVENOUS
  Filled 2016-06-26: qty 1
  Filled 2016-06-26: qty 2

## 2016-06-26 MED ORDER — ASPIRIN 81 MG PO CHEW
324.0000 mg | CHEWABLE_TABLET | ORAL | Status: AC
  Administered 2016-06-27: 324 mg via ORAL
  Filled 2016-06-26: qty 4

## 2016-06-26 MED ORDER — INSULIN ASPART 100 UNIT/ML ~~LOC~~ SOLN
0.0000 [IU] | Freq: Every day | SUBCUTANEOUS | Status: DC
  Administered 2016-06-26: 2 [IU] via SUBCUTANEOUS

## 2016-06-26 NOTE — Progress Notes (Signed)
  Echocardiogram Echocardiogram Stress Test has been performed.  Janalyn HarderWest, Tina R 06/26/2016, 3:41 PM

## 2016-06-26 NOTE — Plan of Care (Signed)
Problem: Pain Managment: Goal: General experience of comfort will improve Outcome: Progressing Denies any pain at this time.   

## 2016-06-26 NOTE — Telephone Encounter (Signed)
Patient's wife called to give an update on the patient. Since starting the invokana the patient has been nausea, diarreha and dizziness. On 06/24/2016 patient was admitted to the hospital due to tingling, numbness in his hands, lower back pain and chest pain. The patient's wife stated they have been evaluating the patient for a stroke and could possibly be doing a brain scan. Patient's wife wanted to know what should happen once he is release from the hospital. I advised her would could see the patient for a hospital f/u to further evaluate what should take place. She voiced understand in. I advised her Dr. Everardo AllEllison was out of the office and would return on 06/27/2016 and she voiced understanding.

## 2016-06-26 NOTE — Consult Note (Signed)
CARDIOLOGY CONSULT NOTE       Patient ID: Xavier [name] MRN: [id number] DOB/AGE: [birth year] 51 y.o.  Admit date: 06/25/2016 Referring Physician: Benjamine Mola Primary Physician: Romero Belling, MD Primary Cardiologist:  Alice Reichert Reason for Consultation: Chest Pain  Principal Problem:   Chest pain Active Problems:   HYPERCHOLESTEROLEMIA   ANEMIA, PERNICIOUS   Essential hypertension   Coronary atherosclerosis   Pulmonary embolism (HCC)   Depressive disorder   Personal history of DVT (deep vein thrombosis)   HPI:  AADITYA [name] is a 51 y.o. male with medical history significant of pernicious anemia, cataract of the right eye, coronary artery disease (treated medically), history of cath  hyperlipidemia, hypertension, DVT, history of pulmonary embolism, chronic back pain, sleep apnea on CPAP who comes to the emergency department with complaints of progressively worse dyspnea on exertion and decreased exercise capacity, to the point in the walking between rooms at home triggers symptoms, for one month. The patient also states he has been having chest pressure, nonradiating, associated with mild dizziness, diaphoresis, palpitations and panic attacks which usually gets better with rest. He denies nausea, emesis, productive cough or wheezing. He denies orthopnea, PND, but gets occasional lower extremity edema. He also complains of worsening lower back pain with occasional tingling and numbness of lower extremities.Wife indicates concern for stroke with arm and leg symptoms and ? Slower speech   He denies abdominal pain, nausea, emesis, melena, hematochezia, diarrhea or constipation. He denies GU symptoms.  ROS All other systems reviewed and negative except as noted above  Past Medical History:  Diagnosis Date  . ANEMIA, PERNICIOUS 04/03/2007  . Cataract   . CORONARY ARTERY DISEASE 12/05/2006  . DIABETES MELLITUS, TYPE II 12/05/2006  . HYPERCHOLESTEROLEMIA 07/17/2007  . HYPERTENSION 12/05/2006  .  Learning disability   . PE (pulmonary embolism)   . VISUAL ACUITY, DECREASED, RIGHT EYE 02/19/2009    Family History  Problem Relation Age of Onset  . COPD Mother   . Lymphoma Father     Social History   Social History  . Marital status: Married    Spouse name: N/A  . Number of children: N/A  . Years of education: N/A   Occupational History  . Disabled Disabled   Social History Main Topics  . Smoking status: Former Smoker    Packs/day: 0.10    Years: 3.00    Types: Cigarettes    Quit date: 05/15/1984  . Smokeless tobacco: Never Used  . Alcohol use Yes     Comment: Occassional  . Drug use: No  . Sexual activity: Not on file   Other Topics Concern  . Not on file   Social History Narrative   Disabled due to left wrist injury at work    Past Surgical History:  Procedure Laterality Date  . EYE SURGERY Right    Cataract  . Repair Left hand    . triple hernia repair       . canagliflozin  300 mg Oral QAC breakfast  . diazepam  10 mg Oral TID  . DULoxetine  30 mg Oral Daily  . famotidine  20 mg Oral BID  . FLUoxetine  40 mg Oral Daily  . losartan  50 mg Oral Daily   And  . hydrochlorothiazide  12.5 mg Oral Daily  . linagliptin  5 mg Oral Daily  . metFORMIN  1,000 mg Oral BID WC  . rivaroxaban  20 mg Oral Q supper  . simvastatin  40 mg Oral QHS  .  sodium chloride flush  3 mL Intravenous Q12H     Physical Exam: Blood pressure 128/85, pulse 66, temperature 98.1 F (36.7 C), temperature source Oral, resp. rate 18, height 6' (1.829 m), weight 276 lb (125.2 kg), SpO2 98 %.   Affect appropriate Healthy:  appears stated age HEENT: normal Neck supple with no adenopathy JVP normal no bruits no thyromegaly Lungs clear with no wheezing and good diaphragmatic motion Heart:  S1/S2 no murmur, no rub, gallop or click PMI normal Abdomen: benighn, BS positve, no tenderness, no AAA no bruit.  No HSM or HJR Distal pulses intact with no bruits No edema Neuro  non-focal Skin warm and dry No muscular weakness Left hand surgery reattached by Gramig after trauma   Labs:   Lab Results  Component Value Date   WBC 9.1 06/25/2016   HGB 13.3 06/25/2016   HCT 38.9 (L) 06/25/2016   MCV 89.8 06/25/2016   PLT 364 06/25/2016     Recent Labs Lab 06/25/16 1552 06/26/16 0318  NA 137 135  K 3.7 3.5  CL 100* 100*  CO2 26 25  BUN 14 15  CREATININE 0.98 1.11  CALCIUM 9.5 9.1  PROT 7.2  --   BILITOT 0.9  --   ALKPHOS 52  --   ALT 24  --   AST 19  --   GLUCOSE 126* 160*   Lab Results  Component Value Date   CKTOTAL 16 06/11/2010   CKMB 1.0 06/11/2010   TROPONINI <0.03 06/26/2016    Lab Results  Component Value Date   CHOL 131 06/15/2016   CHOL 222 (H) 06/03/2015   CHOL 232 (H) 09/24/2012   Lab Results  Component Value Date   HDL 46.60 06/15/2016   HDL 50.00 06/03/2015   HDL 45.30 09/24/2012   Lab Results  Component Value Date   LDLCALC (H) 06/11/2010    120        Total Cholesterol/HDL:CHD Risk Coronary Heart Disease Risk Table                     Men   Women  1/2 Average Risk   3.4   3.3  Average Risk       5.0   4.4  2 X Average Risk   9.6   7.1  3 X Average Risk  23.4   11.0        Use the calculated Patient Ratio above and the CHD Risk Table to determine the patient's CHD Risk.        ATP III CLASSIFICATION (LDL):  <100     mg/dL   Optimal  147-829  mg/dL   Near or Above                    Optimal  130-159  mg/dL   Borderline  562-130  mg/dL   High  >865     mg/dL   Very High   LDLCALC 44 08/18/2009   LDLCALC 51 06/12/2008   Lab Results  Component Value Date   TRIG 212.0 (H) 06/15/2016   TRIG 208.0 (H) 06/03/2015   TRIG 276.0 (H) 09/24/2012   Lab Results  Component Value Date   CHOLHDL 3 06/15/2016   CHOLHDL 4 06/03/2015   CHOLHDL 5 09/24/2012   Lab Results  Component Value Date   LDLDIRECT 63.0 06/15/2016   LDLDIRECT 143.0 06/03/2015   LDLDIRECT 141.0 09/24/2012      Radiology: Dg Chest 2  View  Result Date: 06/25/2016 CLINICAL DATA:  Shortness of breath. EXAM: CHEST  2 VIEW COMPARISON:  November 25, 2013 FINDINGS: The heart size and mediastinal contours are within normal limits. Both lungs are clear. The visualized skeletal structures are unremarkable. IMPRESSION: No active cardiopulmonary disease. Electronically Signed   By: Gerome Samavid  Williams III M.D   On: 06/25/2016 13:30    EKG:  SR nonspecfic ST/T Wave changes    ASSESSMENT AND PLAN:   Chest Pain: atypical r/o ECG no acute changes reviewed cath from 2012 done by myself and no significant CAD. Primary service has already ordered dobutamine echo which is fine Described procedure to patient willing to proceed  Dyspnea; lungs clear CXR NAD BNP normal will assess EF with dobutamine echo check D dimer but PE less likely on DOAC for history of PE/DVT   Neuro: no objective signs of CVA ASA per primary service consider CT  DM:  Discussed low carb diet.  Target hemoglobin A1c is 6.5 or less.  Continue current medications.  Chol: on statin  Signed: Charlton Hawseter Nishan 06/26/2016, 8:56 AM

## 2016-06-26 NOTE — Progress Notes (Signed)
Pt refuse NIV for the night, pt was informed that if he does change his mind to please let staff know to notify me. Pt is on oxygen therapy at this time.

## 2016-06-26 NOTE — Progress Notes (Addendum)
Patient for dobutamine echocardiogram this p.m.  Patient without chest pain and ECG not acute upon arrival.  Patient resting heart rate in the 60s.  Dobutamine started at 10 mcg/kg/m and uptitrated to 50 mcg/kg/m. When the dobutamine was at 50 mcg/kg/m, his P waves inverted on his ECG and he went into junctional rhythm. His heart rate was in the 90s. Target heart rate 144.  He then developed chest pain, 6/10. This was associated with shortness of breath and diaphoresis. Upon review of his ECG, he was still in a junctional rhythm, his T waves were higher than prior to starting the dobutamine. He had some upsloping ST depression. He was also having frequent PVCs. The dobutamine was discontinued. He was placed on oxygen. Initially, his blood pressure dropped into the 70s, but gradually improved. He was 120 systolic prior to leaving the stress lab.  Dr. Eden EmmsNishan was contacted and is aware. He advised to treat the symptoms and plan cath for tomorrow.  He was taken back up to the floor, and by arrival to floor, systolic blood pressure was 160. He was given sublingual nitroglycerin 3, and 4 mg of morphine. A stat troponin was drawn and is pending.  Leanna BattlesBarrett, Rhonda, PA-C 06/26/2016 3:29 PM Beeper [phone]  Pt resting more comfortably now, still w/ CP at 2/10, but is sleepy. ECG not acute. The risks and benefits of a cardiac catheterization including, but not limited to, death, stroke, MI, kidney damage and bleeding were discussed with the patient's wife and family who indicate understanding and agree to proceed.  Pt on for cath 02/13 noon, Dr Herbie BaltimoreHarding. Orders written.  Leanna BattlesBarrett, Rhonda, PA-C 06/26/2016 3:52 PM Beeper [phone]

## 2016-06-26 NOTE — Progress Notes (Signed)
Troponin 0.93. MD on call notified. Patient denies any pain. Already on Nitro drip. Will continue to monitor.

## 2016-06-26 NOTE — Progress Notes (Signed)
PROGRESS NOTE    Xavier [name]  VWU:[id number] DOB: 1966-05-04 DOA: 06/25/2016 PCP: Romero Belling, MD   Outpatient Specialists:     Brief Narrative:  Xavier [name] is a 51 y.o. male with medical history significant of pernicious anemia, cataract of the right eye, coronary artery disease (treated medically), history of MI, hyperlipidemia, hypertension, DVT, history of pulmonary embolism, chronic back pain, sleep apnea on CPAP who comes to the emergency department with complaints of progressively worse dyspnea on exertion and decreased exercise capacity, to the point in the walking between rooms at home triggers symptoms, for one month. The patient also states he has been having chest pressure, nonradiating, associated with mild dizziness, diaphoresis, palpitations and panic attacks which usually gets better with rest. He denies nausea, emesis, productive cough or wheezing. He denies orthopnea, PND, but gets occasional lower extremity edema. He also complains of worsening lower back pain with occasional tingling and numbness of lower extremities.  He denies abdominal pain, nausea, emesis, melena, hematochezia, diarrhea or constipation. He denies GU symptoms.   Assessment & Plan:   Principal Problem:   Chest pain Active Problems:   HYPERCHOLESTEROLEMIA   ANEMIA, PERNICIOUS   Essential hypertension   Coronary atherosclerosis   Pulmonary embolism (HCC)   Depressive disorder   Personal history of DVT (deep vein thrombosis)    Chest pain Attempted stress test but had pain during so sent back to floor Stat troponin and EKG pending -cards consult pending -CE negative    Coronary atherosclerosis Continue Xarelto and simvastatin.    HYPERCHOLESTEROLEMIA  simvastatin 40 mg by mouth daily. Interval monitoring of hepatic panel function and fasting lipids.    ANEMIA, PERNICIOUS Hemoglobin within normal range. Continue monthly IM vitamin B12 supplementation.    Essential  hypertension Continue Hyzaar 50-12 0.5 mg by mouth daily. Monitor blood pressure.    Personal history of DVT (deep vein thrombosis)/Pulmonary embolism (HCC) Continue Xarelto.    Depressive disorder Continue duloxetine 30 mg by mouth daily. Continue fluoxetine 40 mg by mouth daily     DVT prophylaxis:  Fully anticoagulated   Code Status: Full Code   Family Communication: At  bedside  Disposition Plan:     Consultants:   cards    Subjective: Chest pain during stress test  Objective: Vitals:   06/25/16 1930 06/25/16 2011 06/26/16 0458 06/26/16 1423  BP: 149/95 (!) 168/88 128/85 125/81  Pulse: 81 77 66 69  Resp: 20 18 18    Temp:  98 F (36.7 C) 98.1 F (36.7 C)   TempSrc:  Oral Oral   SpO2: 99% 99% 98%   Weight:      Height:        Intake/Output Summary (Last 24 hours) at 06/26/16 1522 Last data filed at 06/26/16 0811  Gross per 24 hour  Intake                0 ml  Output                0 ml  Net                0 ml   Filed Weights   06/25/16 1224  Weight: 125.2 kg (276 lb)    Examination:  General exam: Appears calm and comfortable  Respiratory system: Clear to auscultation. Respiratory effort normal. Cardiovascular system: S1 & S2 heard, RRR. No JVD, murmurs, rubs, gallops or clicks. No pedal edema. Gastrointestinal system: Abdomen is nondistended, soft and nontender. No organomegaly or  masses felt. Normal bowel sounds heard. Central nervous system: Alert and oriented. No focal neurological deficits.    Data Reviewed: I have personally reviewed following labs and imaging studies  CBC:  Recent Labs Lab 06/25/16 1552  WBC 9.1  NEUTROABS 5.2  HGB 13.3  HCT 38.9*  MCV 89.8  PLT 364   Basic Metabolic Panel:  Recent Labs Lab 06/25/16 1552 06/26/16 0318  NA 137 135  K 3.7 3.5  CL 100* 100*  CO2 26 25  GLUCOSE 126* 160*  BUN 14 15  CREATININE 0.98 1.11  CALCIUM 9.5 9.1  MG 1.7  --    GFR: Estimated Creatinine Clearance:  108.8 mL/min (by C-G formula based on SCr of 1.11 mg/dL). Liver Function Tests:  Recent Labs Lab 06/25/16 1552  AST 19  ALT 24  ALKPHOS 52  BILITOT 0.9  PROT 7.2  ALBUMIN 4.1   No results for input(s): LIPASE, AMYLASE in the last 168 hours. No results for input(s): AMMONIA in the last 168 hours. Coagulation Profile: No results for input(s): INR, PROTIME in the last 168 hours. Cardiac Enzymes:  Recent Labs Lab 06/25/16 2118 06/26/16 0318  TROPONINI <0.03 <0.03   BNP (last 3 results) No results for input(s): PROBNP in the last 8760 hours. HbA1C: No results for input(s): HGBA1C in the last 72 hours. CBG:  Recent Labs Lab 06/25/16 1446 06/25/16 2246 06/26/16 0631 06/26/16 1120  GLUCAP 135* 182* 185* 158*   Lipid Profile: No results for input(s): CHOL, HDL, LDLCALC, TRIG, CHOLHDL, LDLDIRECT in the last 72 hours. Thyroid Function Tests:  Recent Labs  06/25/16 1552  TSH 1.264   Anemia Panel: No results for input(s): VITAMINB12, FOLATE, FERRITIN, TIBC, IRON, RETICCTPCT in the last 72 hours. Urine analysis:    Component Value Date/Time   COLORURINE YELLOW 06/15/2016 1105   APPEARANCEUR CLEAR 06/15/2016 1105   LABSPEC 1.025 06/15/2016 1105   PHURINE 5.5 06/15/2016 1105   GLUCOSEU NEGATIVE 06/15/2016 1105   HGBUR NEGATIVE 06/15/2016 1105   BILIRUBINUR NEGATIVE 06/15/2016 1105   KETONESUR NEGATIVE 06/15/2016 1105   PROTEINUR NEGATIVE 04/12/2007 0515   UROBILINOGEN 0.2 06/15/2016 1105   NITRITE NEGATIVE 06/15/2016 1105   LEUKOCYTESUR NEGATIVE 06/15/2016 1105    )No results found for this or any previous visit (from the past 240 hour(s)).    Anti-infectives    None       Radiology Studies: Dg Chest 2 View  Result Date: 06/25/2016 CLINICAL DATA:  Shortness of breath. EXAM: CHEST  2 VIEW COMPARISON:  November 25, 2013 FINDINGS: The heart size and mediastinal contours are within normal limits. Both lungs are clear. The visualized skeletal structures are  unremarkable. IMPRESSION: No active cardiopulmonary disease. Electronically Signed   By: Gerome Sam III M.D   On: 06/25/2016 13:30        Scheduled Meds: . atropine  1 mg Intravenous Once  . diazepam  10 mg Oral TID  . DULoxetine  30 mg Oral Daily  . famotidine  20 mg Oral BID  . FLUoxetine  40 mg Oral Daily  . losartan  50 mg Oral Daily   And  . hydrochlorothiazide  12.5 mg Oral Daily  . linagliptin  5 mg Oral Daily  . rivaroxaban  20 mg Oral Q supper  . simvastatin  40 mg Oral QHS  . sodium chloride flush  3 mL Intravenous Q12H   Continuous Infusions: . DOBUTamine    . nitroGLYCERIN       LOS: 0 days  Time spent: 35 min    JESSICA Juanetta GoslingU VANN, DO [facility] Pager [phone]  If 7PM-7AM, please contact night-coverage [email or web address] Password [email or web address] 06/26/2016, 3:22 PM

## 2016-06-26 NOTE — Care Management Obs Status (Signed)
MEDICARE OBSERVATION STATUS NOTIFICATION   Patient Details  Name: Xavier [name] MRN: [id number] Date of Birth: April 17, 1966   Medicare Observation Status Notification Given:  Yes    Darrold SpanWebster, Kristi Hall, RN 06/26/2016, 4:45 PM

## 2016-06-27 ENCOUNTER — Other Ambulatory Visit: Payer: Self-pay

## 2016-06-27 ENCOUNTER — Ambulatory Visit: Payer: Medicare Other | Admitting: Nurse Practitioner

## 2016-06-27 ENCOUNTER — Encounter (HOSPITAL_COMMUNITY)
Admission: EM | Disposition: A | Discharge status: Home / Self Care | Payer: Self-pay | Source: Home / Self Care | Attending: Internal Medicine

## 2016-06-27 DIAGNOSIS — R0600 Dyspnea, unspecified: Secondary | ICD-10-CM

## 2016-06-27 DIAGNOSIS — R9439 Abnormal result of other cardiovascular function study: Secondary | ICD-10-CM

## 2016-06-27 DIAGNOSIS — I25110 Atherosclerotic heart disease of native coronary artery with unstable angina pectoris: Principal | ICD-10-CM

## 2016-06-27 DIAGNOSIS — Z86718 Personal history of other venous thrombosis and embolism: Secondary | ICD-10-CM

## 2016-06-27 DIAGNOSIS — I10 Essential (primary) hypertension: Secondary | ICD-10-CM

## 2016-06-27 DIAGNOSIS — F329 Major depressive disorder, single episode, unspecified: Secondary | ICD-10-CM

## 2016-06-27 DIAGNOSIS — I2699 Other pulmonary embolism without acute cor pulmonale: Secondary | ICD-10-CM

## 2016-06-27 DIAGNOSIS — R0789 Other chest pain: Secondary | ICD-10-CM

## 2016-06-27 DIAGNOSIS — E119 Type 2 diabetes mellitus without complications: Secondary | ICD-10-CM

## 2016-06-27 HISTORY — PX: LEFT HEART CATH AND CORONARY ANGIOGRAPHY: CATH118249

## 2016-06-27 HISTORY — PX: CORONARY STENT INTERVENTION: CATH118234

## 2016-06-27 HISTORY — PX: INTRAVASCULAR PRESSURE WIRE/FFR STUDY: CATH118243

## 2016-06-27 LAB — TROPONIN I
Troponin I: 0.46 ng/mL (ref <0.03)
Troponin I: 0.19 ng/mL (ref <0.03)

## 2016-06-27 LAB — CBC
HCT: 40.6 % (ref 39.0–52.0)
Hemoglobin: 13.8 g/dL (ref 13.0–17.0)
MCH: 30.8 pg (ref 26.0–34.0)
MCHC: 34 g/dL (ref 30.0–36.0)
MCV: 90.6 fL (ref 78.0–100.0)
Platelets: 347 10*3/uL (ref 150–400)
RBC: 4.48 MIL/uL (ref 4.22–5.81)
RDW: 12.8 % (ref 11.5–15.5)
WBC: 9.8 10*3/uL (ref 4.0–10.5)

## 2016-06-27 LAB — GLUCOSE, CAPILLARY
Glucose-Capillary: 182 mg/dL — ABNORMAL HIGH (ref 65–99)
Glucose-Capillary: 134 mg/dL — ABNORMAL HIGH (ref 65–99)
Glucose-Capillary: 127 mg/dL — ABNORMAL HIGH (ref 65–99)
Glucose-Capillary: 195 mg/dL — ABNORMAL HIGH (ref 65–99)

## 2016-06-27 LAB — POCT ACTIVATED CLOTTING TIME
Activated Clotting Time: 329 seconds
Activated Clotting Time: 356 seconds

## 2016-06-27 LAB — CREATININE, SERUM
Creatinine, Ser: 0.93 mg/dL (ref 0.61–1.24)
GFR calc Af Amer: >60 mL/min (ref >60)
GFR calc non Af Amer: >60 mL/min (ref >60)

## 2016-06-27 SURGERY — LEFT HEART CATH AND CORONARY ANGIOGRAPHY

## 2016-06-27 MED ORDER — RIVAROXABAN 20 MG PO TABS
20.0000 mg | ORAL_TABLET | Freq: Every day | ORAL | Status: DC
  Administered 2016-06-27: 21:00:00 20 mg via ORAL
  Filled 2016-06-27: qty 1

## 2016-06-27 MED ORDER — SODIUM CHLORIDE 0.9% FLUSH: 3.0000 mL | INTRAVENOUS | Status: DC | PRN

## 2016-06-27 MED ORDER — ADENOSINE (DIAGNOSTIC) 140MCG/KG/MIN
INTRAVENOUS | Status: DC | PRN
  Administered 2016-06-27: 140 ug/kg/min via INTRAVENOUS

## 2016-06-27 MED ORDER — LIDOCAINE HCL (PF) 1 % IJ SOLN
INTRAMUSCULAR | Status: AC
  Filled 2016-06-27: qty 30

## 2016-06-27 MED ORDER — IOPAMIDOL (ISOVUE-370) INJECTION 76%
INTRAVENOUS | Status: DC | PRN
  Administered 2016-06-27: 145 mL via INTRAVENOUS

## 2016-06-27 MED ORDER — LABETALOL HCL 5 MG/ML IV SOLN
INTRAVENOUS | Status: AC
  Filled 2016-06-27: qty 4

## 2016-06-27 MED ORDER — HYDRALAZINE HCL 20 MG/ML IJ SOLN: 5.0000 mg | INTRAMUSCULAR | Status: DC | PRN

## 2016-06-27 MED ORDER — HEPARIN (PORCINE) IN NACL 2-0.9 UNIT/ML-% IJ SOLN
INTRAMUSCULAR | Status: DC | PRN
  Administered 2016-06-27: 10 mL via INTRA_ARTERIAL

## 2016-06-27 MED ORDER — SODIUM CHLORIDE 0.9 % IV SOLN: 250.0000 mL | INTRAVENOUS | Status: DC | PRN

## 2016-06-27 MED ORDER — LABETALOL HCL 5 MG/ML IV SOLN: 10.0000 mg | INTRAVENOUS | Status: DC | PRN

## 2016-06-27 MED ORDER — HYDRALAZINE HCL 20 MG/ML IJ SOLN
5.0000 mg | INTRAMUSCULAR | Status: AC | PRN
  Administered 2016-06-27: 5 mg via INTRAVENOUS
  Filled 2016-06-27: qty 1

## 2016-06-27 MED ORDER — FENTANYL CITRATE (PF) 100 MCG/2ML IJ SOLN
INTRAMUSCULAR | Status: AC
  Filled 2016-06-27: qty 2

## 2016-06-27 MED ORDER — SODIUM CHLORIDE 0.9% FLUSH: 3.0000 mL | Freq: Two times a day (BID) | INTRAVENOUS | Status: DC

## 2016-06-27 MED ORDER — LIDOCAINE HCL (PF) 1 % IJ SOLN
INTRAMUSCULAR | Status: DC | PRN
  Administered 2016-06-27: 3 mL

## 2016-06-27 MED ORDER — IOPAMIDOL (ISOVUE-370) INJECTION 76%
INTRAVENOUS | Status: AC
  Filled 2016-06-27: qty 50

## 2016-06-27 MED ORDER — NITROGLYCERIN 1 MG/10 ML FOR IR/CATH LAB
INTRA_ARTERIAL | Status: AC
  Filled 2016-06-27: qty 10

## 2016-06-27 MED ORDER — TICAGRELOR 90 MG PO TABS
ORAL_TABLET | ORAL | Status: AC
  Filled 2016-06-27: qty 2

## 2016-06-27 MED ORDER — VERAPAMIL HCL 2.5 MG/ML IV SOLN
INTRAVENOUS | Status: AC
  Filled 2016-06-27: qty 2

## 2016-06-27 MED ORDER — CLOPIDOGREL BISULFATE 75 MG PO TABS: 75.0000 mg | ORAL_TABLET | Freq: Every day | ORAL | Status: DC

## 2016-06-27 MED ORDER — MIDAZOLAM HCL 2 MG/2ML IJ SOLN
INTRAMUSCULAR | Status: DC | PRN
  Administered 2016-06-27: 2 mg via INTRAVENOUS

## 2016-06-27 MED ORDER — ASPIRIN 81 MG PO CHEW
81.0000 mg | CHEWABLE_TABLET | Freq: Every day | ORAL | Status: DC
  Administered 2016-06-28: 09:00:00 81 mg via ORAL
  Filled 2016-06-27: qty 1

## 2016-06-27 MED ORDER — MIDAZOLAM HCL 2 MG/2ML IJ SOLN
INTRAMUSCULAR | Status: AC
  Filled 2016-06-27: qty 2

## 2016-06-27 MED ORDER — ADENOSINE 12 MG/4ML IV SOLN
INTRAVENOUS | Status: AC
  Filled 2016-06-27: qty 16

## 2016-06-27 MED ORDER — CLOPIDOGREL BISULFATE 75 MG PO TABS
300.0000 mg | ORAL_TABLET | Freq: Once | ORAL | Status: AC
  Administered 2016-06-28: 300 mg via ORAL
  Filled 2016-06-27: qty 4

## 2016-06-27 MED ORDER — HEPARIN (PORCINE) IN NACL 2-0.9 UNIT/ML-% IJ SOLN
INTRAMUSCULAR | Status: DC | PRN
  Administered 2016-06-27: 1000 mL
  Administered 2016-06-27: 500 mL

## 2016-06-27 MED ORDER — HEPARIN SODIUM (PORCINE) 1000 UNIT/ML IJ SOLN
INTRAMUSCULAR | Status: AC
  Filled 2016-06-27: qty 1

## 2016-06-27 MED ORDER — TICAGRELOR 90 MG PO TABS
ORAL_TABLET | ORAL | Status: DC | PRN
  Administered 2016-06-27: 180 mg via ORAL

## 2016-06-27 MED ORDER — HEPARIN SODIUM (PORCINE) 1000 UNIT/ML IJ SOLN
INTRAMUSCULAR | Status: DC | PRN
  Administered 2016-06-27: 5000 [IU] via INTRAVENOUS
  Administered 2016-06-27: 6000 [IU] via INTRAVENOUS

## 2016-06-27 MED ORDER — SODIUM CHLORIDE 0.9 % IV SOLN
INTRAVENOUS | Status: AC
  Administered 2016-06-27: 19:00:00 via INTRAVENOUS

## 2016-06-27 MED ORDER — NITROGLYCERIN 1 MG/10 ML FOR IR/CATH LAB
INTRA_ARTERIAL | Status: DC | PRN
  Administered 2016-06-27: 200 ug via INTRACORONARY

## 2016-06-27 MED ORDER — TICAGRELOR 90 MG PO TABS: 90.0000 mg | ORAL_TABLET | Freq: Two times a day (BID) | ORAL | Status: DC

## 2016-06-27 MED ORDER — HEPARIN (PORCINE) IN NACL 2-0.9 UNIT/ML-% IJ SOLN
INTRAMUSCULAR | Status: AC
  Filled 2016-06-27: qty 1500

## 2016-06-27 MED ORDER — IOPAMIDOL (ISOVUE-370) INJECTION 76%
INTRAVENOUS | Status: AC
  Filled 2016-06-27: qty 100

## 2016-06-27 MED ORDER — FENTANYL CITRATE (PF) 100 MCG/2ML IJ SOLN
INTRAMUSCULAR | Status: DC | PRN
  Administered 2016-06-27: 50 ug via INTRAVENOUS

## 2016-06-27 MED ORDER — ANGIOPLASTY BOOK
Freq: Once | Status: AC
  Administered 2016-06-27: 21:00:00
  Filled 2016-06-27: qty 1

## 2016-06-27 MED ORDER — HEPARIN SODIUM (PORCINE) 5000 UNIT/ML IJ SOLN: 5000.0000 [IU] | Freq: Three times a day (TID) | INTRAMUSCULAR | Status: DC

## 2016-06-27 MED ORDER — LABETALOL HCL 5 MG/ML IV SOLN
10.0000 mg | INTRAVENOUS | Status: AC | PRN
  Administered 2016-06-27: 10 mg via INTRAVENOUS

## 2016-06-27 SURGICAL SUPPLY — 20 items
BALLN EMERGE MR 2.0X8 (BALLOONS) ×2
BALLN ~~LOC~~ EMERGE MR 2.5X8 (BALLOONS) ×2
BALLOON EMERGE MR 2.0X8 (BALLOONS) ×1 IMPLANT
BALLOON ~~LOC~~ EMERGE MR 2.5X8 (BALLOONS) ×1 IMPLANT
CATH INFINITI 5 FR JL3.5 (CATHETERS) ×2 IMPLANT
CATH OPTITORQUE TIG 4.0 5F (CATHETERS) ×2 IMPLANT
CATH VISTA GUIDE 6FR XBLAD3.5 (CATHETERS) ×2 IMPLANT
DEVICE RAD COMP TR BAND LRG (VASCULAR PRODUCTS) ×2 IMPLANT
GLIDESHEATH SLEND A-KIT 6F 22G (SHEATH) ×2 IMPLANT
GUIDEWIRE INQWIRE 1.5J.035X260 (WIRE) ×1 IMPLANT
GUIDEWIRE PRESSURE COMET II (WIRE) ×2 IMPLANT
INQWIRE 1.5J .035X260CM (WIRE) ×2
KIT ENCORE 26 ADVANTAGE (KITS) ×2 IMPLANT
KIT ESSENTIALS PG (KITS) ×2 IMPLANT
KIT HEART LEFT (KITS) ×2 IMPLANT
PACK CARDIAC CATHETERIZATION (CUSTOM PROCEDURE TRAY) ×2 IMPLANT
STENT SYNERGY DES 2.25X12 (Permanent Stent) ×2 IMPLANT
SYR 8ML ANGIOGRAPH HI-PRES (SYRINGE) ×2 IMPLANT
TRANSDUCER W/STOPCOCK (MISCELLANEOUS) ×2 IMPLANT
TUBING CIL FLEX 10 FLL-RA (TUBING) ×2 IMPLANT

## 2016-06-27 NOTE — Progress Notes (Signed)
PROGRESS NOTE    Xavier [name]  ZOX:[id number] DOB: 05-Oct-1965 DOA: 06/25/2016 PCP: Romero Belling, MD   Outpatient Specialists:     Brief Narrative:  Xavier [name] is a 51 y.o. male with medical history significant of pernicious anemia, cataract of the right eye, coronary artery disease (treated medically), history of MI, hyperlipidemia, hypertension, DVT, history of pulmonary embolism, chronic back pain, sleep apnea on CPAP who comes to the emergency department with complaints of progressively worse dyspnea on exertion and decreased exercise capacity, to the point in the walking between rooms at home triggers symptoms, for one month. The patient also states he has been having chest pressure, nonradiating, associated with mild dizziness, diaphoresis, palpitations and panic attacks which usually gets better with rest. He denies nausea, emesis, productive cough or wheezing. He denies orthopnea, PND, but gets occasional lower extremity edema. He also complains of worsening lower back pain with occasional tingling and numbness of lower extremities.  He denies abdominal pain, nausea, emesis, melena, hematochezia, diarrhea or constipation. He denies GU symptoms.   Assessment & Plan:   Principal Problem:   Chest pain Active Problems:   HYPERCHOLESTEROLEMIA   ANEMIA, PERNICIOUS   Essential hypertension   Coronary atherosclerosis   Pulmonary embolism (HCC)   Depressive disorder   Personal history of DVT (deep vein thrombosis)    Chest pain Attempted stress test but had pain during so sent back to floor Troponin slightly elevated -plan for cath toay (cards to consider bare metal stents given need for xarelto)    Coronary atherosclerosis  Xarelto on hold     HYPERCHOLESTEROLEMIA  simvastatin 40 mg by mouth daily. Interval monitoring of hepatic panel function and fasting lipids.    ANEMIA, PERNICIOUS Hemoglobin within normal range. Continue monthly IM vitamin B12  supplementation.    Essential hypertension Continue Hyzaar 50-12 0.5 mg by mouth daily. Monitor blood pressure.    Personal history of DVT (deep vein thrombosis)/Pulmonary embolism (HCC)  Xarelto on hold    Depressive disorder Continue duloxetine 30 mg by mouth daily. Continue fluoxetine 40 mg by mouth daily     DVT prophylaxis:  Fully anticoagulated   Code Status: Full Code   Family Communication: At  bedside  Disposition Plan:     Consultants:   cards    Subjective: No further chest pain -on nitro gtt  Objective: Vitals:   06/26/16 1600 06/26/16 2011 06/27/16 0615 06/27/16 1000  BP: 125/68 133/82 116/70 (!) 104/57  Pulse: 62 66 62 67  Resp:  18 17   Temp:  97.7 F (36.5 C) 97.1 F (36.2 C)   TempSrc:  Oral Oral   SpO2: 100% 100% 99%   Weight:      Height:        Intake/Output Summary (Last 24 hours) at 06/27/16 1114 Last data filed at 06/27/16 0703  Gross per 24 hour  Intake                0 ml  Output             1000 ml  Net            -1000 ml   Filed Weights   06/25/16 1224  Weight: 125.2 kg (276 lb)    Examination:  General exam: Appears calm and comfortable  Respiratory system: Clear to auscultation. Respiratory effort normal. Cardiovascular system: S1 & S2 heard, RRR. No JVD, murmurs, rubs, gallops or clicks. No pedal edema. Gastrointestinal system: Abdomen is nondistended, soft  and nontender. No organomegaly or masses felt. Normal bowel sounds heard. Central nervous system: Alert and oriented. No focal neurological deficits.    Data Reviewed: I have personally reviewed following labs and imaging studies  CBC:  Recent Labs Lab 06/25/16 1552  WBC 9.1  NEUTROABS 5.2  HGB 13.3  HCT 38.9*  MCV 89.8  PLT 364   Basic Metabolic Panel:  Recent Labs Lab 06/25/16 1552 06/26/16 0318 06/26/16 1923  NA 137 135 135  K 3.7 3.5 4.0  CL 100* 100* 100*  CO2 26 25 25   GLUCOSE 126* 160* 147*  BUN 14 15 18   CREATININE 0.98  1.11 1.06  CALCIUM 9.5 9.1 9.1  MG 1.7  --   --    GFR: Estimated Creatinine Clearance: 113.9 mL/min (by C-G formula based on SCr of 1.06 mg/dL). Liver Function Tests:  Recent Labs Lab 06/25/16 1552  AST 19  ALT 24  ALKPHOS 52  BILITOT 0.9  PROT 7.2  ALBUMIN 4.1   No results for input(s): LIPASE, AMYLASE in the last 168 hours. No results for input(s): AMMONIA in the last 168 hours. Coagulation Profile:  Recent Labs Lab 06/26/16 1923  INR 1.07   Cardiac Enzymes:  Recent Labs Lab 06/25/16 2118 06/26/16 0318 06/26/16 1522 06/26/16 2204 06/27/16 0339  TROPONINI <0.03 <0.03 <0.03 0.93* 0.46*   BNP (last 3 results) No results for input(s): PROBNP in the last 8760 hours. HbA1C: No results for input(s): HGBA1C in the last 72 hours. CBG:  Recent Labs Lab 06/25/16 2246 06/26/16 0631 06/26/16 1120 06/26/16 2222 06/27/16 0620  GLUCAP 182* 185* 158* 209* 182*   Lipid Profile: No results for input(s): CHOL, HDL, LDLCALC, TRIG, CHOLHDL, LDLDIRECT in the last 72 hours. Thyroid Function Tests:  Recent Labs  06/25/16 1552  TSH 1.264   Anemia Panel: No results for input(s): VITAMINB12, FOLATE, FERRITIN, TIBC, IRON, RETICCTPCT in the last 72 hours. Urine analysis:    Component Value Date/Time   COLORURINE YELLOW 06/15/2016 1105   APPEARANCEUR CLEAR 06/15/2016 1105   LABSPEC 1.025 06/15/2016 1105   PHURINE 5.5 06/15/2016 1105   GLUCOSEU NEGATIVE 06/15/2016 1105   HGBUR NEGATIVE 06/15/2016 1105   BILIRUBINUR NEGATIVE 06/15/2016 1105   KETONESUR NEGATIVE 06/15/2016 1105   PROTEINUR NEGATIVE 04/12/2007 0515   UROBILINOGEN 0.2 06/15/2016 1105   NITRITE NEGATIVE 06/15/2016 1105   LEUKOCYTESUR NEGATIVE 06/15/2016 1105    )No results found for this or any previous visit (from the past 240 hour(s)).    Anti-infectives    None       Radiology Studies: Dg Chest 2 View  Result Date: 06/25/2016 CLINICAL DATA:  Shortness of breath. EXAM: CHEST  2 VIEW  COMPARISON:  November 25, 2013 FINDINGS: The heart size and mediastinal contours are within normal limits. Both lungs are clear. The visualized skeletal structures are unremarkable. IMPRESSION: No active cardiopulmonary disease. Electronically Signed   By: Gerome Sam III M.D   On: 06/25/2016 13:30        Scheduled Meds: . atropine  1 mg Intravenous Once  . diazepam  10 mg Oral TID  . DULoxetine  30 mg Oral Daily  . famotidine  20 mg Oral BID  . FLUoxetine  40 mg Oral Daily  . losartan  50 mg Oral Daily   And  . hydrochlorothiazide  12.5 mg Oral Daily  . insulin aspart  0-15 Units Subcutaneous TID WC  . insulin aspart  0-5 Units Subcutaneous QHS  . linagliptin  5 mg Oral  Daily  . simvastatin  40 mg Oral QHS  . sodium chloride flush  3 mL Intravenous Q12H  . sodium chloride flush  3 mL Intravenous Q12H   Continuous Infusions: . sodium chloride 10 mL/hr at 06/26/16 1554  . sodium chloride 1 mL/kg/hr (06/27/16 0701)  . nitroGLYCERIN 5 mcg/min (06/26/16 1554)     LOS: 1 day    Time spent: 25 min    JESSICA U VANN, DO [facility] Pager [phone]  If 7PM-7AM, please contact night-coverage [email or web address] Password [email or web address] 06/27/2016, 11:14 AM

## 2016-06-27 NOTE — Interval H&P Note (Signed)
History and Physical Interval Note:  06/27/2016 2:10 PM  Xavier [name]  has presented today for surgery, with the diagnosis of unstable angina with abnormal Stress Echo.   The various methods of treatment have been discussed with the patient and family. After consideration of risks, benefits and other options for treatment, the patient has consented to  Procedure(s): Left Heart Cath and Coronary Angiography (N/A) with Possible Percutaneous Coronary Intervention as a surgical intervention .  The patient's history has been reviewed, patient examined, no change in status, stable for surgery.  I have reviewed the patient's chart and labs.  Questions were answered to the patient's satisfaction.    Cath Lab Visit (complete for each Cath Lab visit)  Clinical Evaluation Leading to the Procedure:   ACS: Yes.    Non-ACS:    Anginal Classification: CCS IV  Anti-ischemic medical therapy: Minimal Therapy (1 class of medications)  Non-Invasive Test Results: Equivocal test results - WBC stress echo with chest discomfort and EKG changes, but no abnormalities on echo  Prior CABG: No previous CABG    Bryan Lemmaavid Harding

## 2016-06-27 NOTE — Progress Notes (Signed)
Subjective:  Denies SSCP, palpitations or Dyspnea   Objective:  Vitals:   06/26/16 1556 06/26/16 1600 06/26/16 2011 06/27/16 0615  BP: 121/73 125/68 133/82 116/70  Pulse: 60 62 66 62  Resp:   18 17  Temp:   97.7 F (36.5 C) 97.1 F (36.2 C)  TempSrc:   Oral Oral  SpO2: 100% 100% 100% 99%  Weight:      Height:        Intake/Output from previous day:  Intake/Output Summary (Last 24 hours) at 06/27/16 0823 Last data filed at 06/27/16 0703  Gross per 24 hour  Intake                0 ml  Output             1000 ml  Net            -1000 ml    Physical Exam: BP 116/70 (BP Location: Right Arm)   Pulse 62   Temp 97.1 F (36.2 C) (Oral)   Resp 17   Ht 6' (1.829 m)   Wt 276 lb (125.2 kg)   SpO2 99%   BMI 37.43 kg/m  Affect appropriate Healthy:  appears stated age HEENT: normal Neck supple with no adenopathy JVP normal no bruits no thyromegaly Lungs clear with no wheezing and good diaphragmatic motion Heart:  S1/S2 no murmur, no rub, gallop or click PMI normal Abdomen: benighn, BS positve, no tenderness, no AAA no bruit.  No HSM or HJR Distal pulses intact with no bruits No edema Neuro non-focal Skin warm and dry No muscular weakness Left hand post surgery reattachment with rod   Lab Results: Basic Metabolic Panel:  Recent Labs  16/02/9601/11/18 1552 06/26/16 0318 06/26/16 1923  NA 137 135 135  K 3.7 3.5 4.0  CL 100* 100* 100*  CO2 26 25 25   GLUCOSE 126* 160* 147*  BUN 14 15 18   CREATININE 0.98 1.11 1.06  CALCIUM 9.5 9.1 9.1  MG 1.7  --   --    Liver Function Tests:  Recent Labs  06/25/16 1552  AST 19  ALT 24  ALKPHOS 52  BILITOT 0.9  PROT 7.2  ALBUMIN 4.1   No results for input(s): LIPASE, AMYLASE in the last 72 hours. CBC:  Recent Labs  06/25/16 1552  WBC 9.1  NEUTROABS 5.2  HGB 13.3  HCT 38.9*  MCV 89.8  PLT 364   Cardiac Enzymes:  Recent Labs  06/26/16 1522 06/26/16 2204 06/27/16 0339  TROPONINI <0.03 0.93* 0.46*    Thyroid Function Tests:  Recent Labs  06/25/16 1552  TSH 1.264   Anemia Panel: No results for input(s): VITAMINB12, FOLATE, FERRITIN, TIBC, IRON, RETICCTPCT in the last 72 hours.  Imaging: Dg Chest 2 View  Result Date: 06/25/2016 CLINICAL DATA:  Shortness of breath. EXAM: CHEST  2 VIEW COMPARISON:  November 25, 2013 FINDINGS: The heart size and mediastinal contours are within normal limits. Both lungs are clear. The visualized skeletal structures are unremarkable. IMPRESSION: No active cardiopulmonary disease. Electronically Signed   By: Gerome Samavid  Williams III M.D   On: 06/25/2016 13:30    Cardiac Studies:  ECG: SR nonspecific ST changes    Telemetry:  NSR 06/27/2016   Echo:   Medications:   . atropine  1 mg Intravenous Once  . diazepam  10 mg Oral TID  . DULoxetine  30 mg Oral Daily  . famotidine  20 mg Oral BID  . FLUoxetine  40  mg Oral Daily  . losartan  50 mg Oral Daily   And  . hydrochlorothiazide  12.5 mg Oral Daily  . insulin aspart  0-15 Units Subcutaneous TID WC  . insulin aspart  0-5 Units Subcutaneous QHS  . linagliptin  5 mg Oral Daily  . simvastatin  40 mg Oral QHS  . sodium chloride flush  3 mL Intravenous Q12H  . sodium chloride flush  3 mL Intravenous Q12H     . sodium chloride 10 mL/hr at 06/26/16 1554  . sodium chloride 1 mL/kg/hr (06/27/16 0701)  . nitroGLYCERIN 5 mcg/min (06/26/16 1554)    Assessment/Plan:  HTN:  Well controlled.  Continue current medications and low sodium Dash type diet.   DM:  Discussed low carb diet.  Target hemoglobin A1c is 6.5 or less.  Continue current medications. 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

## 2016-06-27 NOTE — Progress Notes (Signed)
Cardiology called with EKG result.

## 2016-06-27 NOTE — Progress Notes (Signed)
TR BAND REMOVAL  LOCATION:    right radial  DEFLATED PER PROTOCOL:    Yes.    TIME BAND OFF / DRESSING APPLIED:    20:00   SITE UPON ARRIVAL:    Level 0  SITE AFTER BAND REMOVAL:    Level 0  CIRCULATION SENSATION AND MOVEMENT:    Within Normal Limits   Yes.    COMMENTS:   Post TR band instructions given. Pt tolerated well. 

## 2016-06-27 NOTE — H&P (View-Only) (Signed)
Subjective:  Denies SSCP, palpitations or Dyspnea   Objective:  Vitals:   06/26/16 1556 06/26/16 1600 06/26/16 2011 06/27/16 0615  BP: 121/73 125/68 133/82 116/70  Pulse: 60 62 66 62  Resp:   18 17  Temp:   97.7 F (36.5 C) 97.1 F (36.2 C)  TempSrc:   Oral Oral  SpO2: 100% 100% 100% 99%  Weight:      Height:        Intake/Output from previous day:  Intake/Output Summary (Last 24 hours) at 06/27/16 0823 Last data filed at 06/27/16 0703  Gross per 24 hour  Intake                0 ml  Output             1000 ml  Net            -1000 ml    Physical Exam: BP 116/70 (BP Location: Right Arm)   Pulse 62   Temp 97.1 F (36.2 C) (Oral)   Resp 17   Ht 6' (1.829 m)   Wt 276 lb (125.2 kg)   SpO2 99%   BMI 37.43 kg/m  Affect appropriate Healthy:  appears stated age HEENT: normal Neck supple with no adenopathy JVP normal no bruits no thyromegaly Lungs clear with no wheezing and good diaphragmatic motion Heart:  S1/S2 no murmur, no rub, gallop or click PMI normal Abdomen: benighn, BS positve, no tenderness, no AAA no bruit.  No HSM or HJR Distal pulses intact with no bruits No edema Neuro non-focal Skin warm and dry No muscular weakness Left hand post surgery reattachment with rod   Lab Results: Basic Metabolic Panel:  Recent Labs  16/02/9601/11/18 1552 06/26/16 0318 06/26/16 1923  NA 137 135 135  K 3.7 3.5 4.0  CL 100* 100* 100*  CO2 26 25 25   GLUCOSE 126* 160* 147*  BUN 14 15 18   CREATININE 0.98 1.11 1.06  CALCIUM 9.5 9.1 9.1  MG 1.7  --   --    Liver Function Tests:  Recent Labs  06/25/16 1552  AST 19  ALT 24  ALKPHOS 52  BILITOT 0.9  PROT 7.2  ALBUMIN 4.1   No results for input(s): LIPASE, AMYLASE in the last 72 hours. CBC:  Recent Labs  06/25/16 1552  WBC 9.1  NEUTROABS 5.2  HGB 13.3  HCT 38.9*  MCV 89.8  PLT 364   Cardiac Enzymes:  Recent Labs  06/26/16 1522 06/26/16 2204 06/27/16 0339  TROPONINI <0.03 0.93* 0.46*    Thyroid Function Tests:  Recent Labs  06/25/16 1552  TSH 1.264   Anemia Panel: No results for input(s): VITAMINB12, FOLATE, FERRITIN, TIBC, IRON, RETICCTPCT in the last 72 hours.  Imaging: Dg Chest 2 View  Result Date: 06/25/2016 CLINICAL DATA:  Shortness of breath. EXAM: CHEST  2 VIEW COMPARISON:  November 25, 2013 FINDINGS: The heart size and mediastinal contours are within normal limits. Both lungs are clear. The visualized skeletal structures are unremarkable. IMPRESSION: No active cardiopulmonary disease. Electronically Signed   By: Gerome Samavid  Williams III M.D   On: 06/25/2016 13:30    Cardiac Studies:  ECG: SR nonspecific ST changes    Telemetry:  NSR 06/27/2016   Echo:   Medications:   . atropine  1 mg Intravenous Once  . diazepam  10 mg Oral TID  . DULoxetine  30 mg Oral Daily  . famotidine  20 mg Oral BID  . FLUoxetine  40  mg Oral Daily  . losartan  50 mg Oral Daily   And  . hydrochlorothiazide  12.5 mg Oral Daily  . insulin aspart  0-15 Units Subcutaneous TID WC  . insulin aspart  0-5 Units Subcutaneous QHS  . linagliptin  5 mg Oral Daily  . simvastatin  40 mg Oral QHS  . sodium chloride flush  3 mL Intravenous Q12H  . sodium chloride flush  3 mL Intravenous Q12H     . sodium chloride 10 mL/hr at 06/26/16 1554  . sodium chloride 1 mL/kg/hr (06/27/16 0701)  . nitroGLYCERIN 5 mcg/min (06/26/16 1554)    Assessment/Plan:  HTN:  Well controlled.  Continue current medications and low sodium Dash type diet.   DM:  Discussed low carb diet.  Target hemoglobin A1c is 6.5 or less.  Continue current medications. 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

## 2016-06-27 NOTE — Consult Note (Signed)
[facility] CM Primary Care Navigator  06/27/2016  Xavier [name] August 05, 1965 [id number]   Met with patient, wife Lattie Haw) and sister in-law Marylin Crosby) at the bedside to identify possible discharge needs. Patient was asleep most times during this visit.  Wife reports that patient had developed tingling and numbness that started to his lower extremities that moved to his right arm; had shortness of breath and chest pain which led to this admission.  Patient's wife endorses Dr. [name] with [facility] as the primary care provider.   Wife shared using  [facility] to obtain medications without any problem.   Patient's wife reports managing his medications at home using "pill box" system weekly.   Patient is able to drive prior to admission, however, wife or sister in-law will be providing transportation to his doctors' appointments after discharge as needed.  Wife is the primary caregiver at home as stated.   Anticipated discharge plan is home with wife's assistance.  Patient's wife voiced understanding to call primary care provider's office when he returns home, for a post discharge follow-up appointment within a week or sooner if needed.  Patient letter (with PCP's contact number) provided as a reminder.  Wife denies any health management needs or concerns at this time. She reports that patient's DM had been managed well with Metformin and Januvia, will be stopping Invokana per Dr. Loanne Drilling. She reports attending DM classes that helped in managing DM. Patient has recent A1c of 6.7. Patient had been monitoring and recording blood sugars at home 3 times a day as reported by wife.  [facility] contact information was provided for any future needs that may arise. Wife was grateful of the information.  For additional questions please contact:  Edwena Felty A. Ajel, BSN, RN-BC [facility] PRIMARY CARE Navigator Cell: [phone]

## 2016-06-28 ENCOUNTER — Encounter (HOSPITAL_COMMUNITY): Payer: Self-pay | Admitting: Cardiology

## 2016-06-28 ENCOUNTER — Other Ambulatory Visit: Payer: Self-pay

## 2016-06-28 DIAGNOSIS — I200 Unstable angina: Secondary | ICD-10-CM

## 2016-06-28 DIAGNOSIS — R9439 Abnormal result of other cardiovascular function study: Secondary | ICD-10-CM

## 2016-06-28 DIAGNOSIS — E119 Type 2 diabetes mellitus without complications: Secondary | ICD-10-CM

## 2016-06-28 LAB — BASIC METABOLIC PANEL
Anion gap: 11 (ref 5–15)
BUN: 12 mg/dL (ref 6–20)
CO2: 24 mmol/L (ref 22–32)
Calcium: 8.9 mg/dL (ref 8.9–10.3)
Chloride: 99 mmol/L — ABNORMAL LOW (ref 101–111)
Creatinine, Ser: 0.87 mg/dL (ref 0.61–1.24)
GFR calc Af Amer: >60 mL/min (ref >60)
GFR calc non Af Amer: >60 mL/min (ref >60)
Glucose, Bld: 151 mg/dL — ABNORMAL HIGH (ref 65–99)
Potassium: 3.5 mmol/L (ref 3.5–5.1)
Sodium: 134 mmol/L — ABNORMAL LOW (ref 135–145)

## 2016-06-28 LAB — GLUCOSE, CAPILLARY: Glucose-Capillary: 152 mg/dL — ABNORMAL HIGH (ref 65–99)

## 2016-06-28 LAB — CBC
HCT: 38.2 % — ABNORMAL LOW (ref 39.0–52.0)
Hemoglobin: 12.8 g/dL — ABNORMAL LOW (ref 13.0–17.0)
MCH: 30.3 pg (ref 26.0–34.0)
MCHC: 33.5 g/dL (ref 30.0–36.0)
MCV: 90.3 fL (ref 78.0–100.0)
Platelets: 337 10*3/uL (ref 150–400)
RBC: 4.23 MIL/uL (ref 4.22–5.81)
RDW: 13 % (ref 11.5–15.5)
WBC: 8.5 10*3/uL (ref 4.0–10.5)

## 2016-06-28 MED ORDER — ASPIRIN 81 MG PO CHEW: 81.0000 mg | CHEWABLE_TABLET | Freq: Every day | ORAL | Status: DC

## 2016-06-28 MED ORDER — NITROGLYCERIN 0.4 MG SL SUBL: 0.4000 mg | SUBLINGUAL_TABLET | SUBLINGUAL | 0 refills | Status: DC | PRN

## 2016-06-28 MED ORDER — CLOPIDOGREL BISULFATE 75 MG PO TABS: 75.0000 mg | ORAL_TABLET | Freq: Every day | ORAL | 0 refills | Status: DC

## 2016-06-28 NOTE — Care Management Note (Signed)
Case Management Note  Patient Details  Name: Xavier [name] MRN: [id number] Date of Birth: 07-26-1965  Subjective/Objective:   S/p coronary stent intervention, will be on plavix and asa, for dc , no needs.                 Action/Plan:   Expected Discharge Date:  06/28/16               Expected Discharge Plan:  Home/Self Care  In-House Referral:     Discharge planning Services  CM Consult  Post Acute Care Choice:    Choice offered to:     DME Arranged:    DME Agency:     HH Arranged:    HH Agency:     Status of Service:  Completed, signed off  If discussed at [employer] Length of Stay Meetings, dates discussed:    Additional Comments:  Leone Havenaylor, Deborah Clinton, RN 06/28/2016, 2:30 PM

## 2016-06-28 NOTE — Discharge Summary (Signed)
Physician Discharge Summary  CORON ROSSANO ZOX:[id number] DOB: 29-Dec-1965 DOA: 06/25/2016  PCP: Romero Belling, MD  Admit date: 06/25/2016 Discharge date: 06/28/2016   Recommendations for Outpatient Follow-Up:   Hold metformin 48 hours after cath continue aspirin plus plavix for minimum of 3 months. After that would be okay to stop aspirin if necessary   Discharge Diagnosis:   Principal Problem:   Chest pain Active Problems:   HYPERCHOLESTEROLEMIA   ANEMIA, PERNICIOUS   Essential hypertension   Coronary atherosclerosis   Pulmonary embolism (HCC)   Depressive disorder   Personal history of DVT (deep vein thrombosis)   Dyspnea   Abnormal dobutamine stress echocardiogram   Discharge disposition:  Home  Discharge Condition: Improved.  Diet recommendation: Low sodium, heart healthy.  Carbohydrate-modified.  Wound care: None.   History of Present Illness:   Xavier [name] is a 51 y.o. male with medical history significant of pernicious anemia, cataract of the right eye, coronary artery disease (treated medically), history of MI, hyperlipidemia, hypertension, DVT, history of pulmonary embolism, chronic back pain, sleep apnea on CPAP who comes to the emergency department with complaints of progressively worse dyspnea on exertion and decreased exercise capacity, to the point in the walking between rooms at home triggers symptoms, for one month. The patient also states he has been having chest pressure, nonradiating, associated with mild dizziness, diaphoresis, palpitations and panic attacks which usually gets better with rest. He denies nausea, emesis, productive cough or wheezing. He denies orthopnea, PND, but gets occasional lower extremity edema. He also complains of worsening lower back pain with occasional tingling and numbness of lower extremities.  He denies abdominal pain, nausea, emesis, melena, hematochezia, diarrhea or constipation. He denies GU symptoms.   Hospital  Course by Problem:   CAD  -Taken to cath lab on 06/27/16 and DES placed to distal LAD for focal 70-75% lesion -continue aspirin plus plavix for minimum of 3 months. After that would be okay to stop aspirin if necessary. -cardiology follow up  HTN -Well controlled. Continue home meds, Hyzaar. -DASH diet  DM -Hgb A1c 7.5 on 06/15/2016 -Low carb diet.   DVT/PE -Continue Xarelto- as unprovoked-- to follow up with Pulm. Plavix and aspirin for stent protection. May stop aspirin in 3 months if necessary.    Medical Consultants:    cards   Discharge Exam:   Vitals:   06/27/16 2300 06/28/16 0800  BP:  (!) 172/86  Pulse: 69 70  Resp: 13 19  Temp:  97.3 F (36.3 C)   Vitals:   06/27/16 2000 06/27/16 2100 06/27/16 2300 06/28/16 0800  BP: 129/85 129/85  (!) 172/86  Pulse: 71 71 69 70  Resp: 18 16 13 19   Temp:  97.5 F (36.4 C)  97.3 F (36.3 C)  TempSrc:  Oral  Oral  SpO2: 99% 100% 99% 97%  Weight:      Height:        Gen:  NAD    The results of significant diagnostics from this hospitalization (including imaging, microbiology, ancillary and laboratory) are listed below for reference.     Procedures and Diagnostic Studies:   Dg Chest 2 View  Result Date: 06/25/2016 CLINICAL DATA:  Shortness of breath. EXAM: CHEST  2 VIEW COMPARISON:  November 25, 2013 FINDINGS: The heart size and mediastinal contours are within normal limits. Both lungs are clear. The visualized skeletal structures are unremarkable. IMPRESSION: No active cardiopulmonary disease. Electronically Signed   By: Gerome Sam III M.D  On: 06/25/2016 13:30     Labs:   Basic Metabolic Panel:  Recent Labs Lab 06/25/16 1552 06/26/16 0318 06/26/16 1923 06/27/16 1807 06/28/16 0159  NA 137 135 135  --  134*  K 3.7 3.5 4.0  --  3.5  CL 100* 100* 100*  --  99*  CO2 26 25 25   --  24  GLUCOSE 126* 160* 147*  --  151*  BUN 14 15 18   --  12  CREATININE 0.98 1.11 1.06 0.93 0.87  CALCIUM 9.5 9.1 9.1   --  8.9  MG 1.7  --   --   --   --    GFR Estimated Creatinine Clearance: 138.8 mL/min (by C-G formula based on SCr of 0.87 mg/dL). Liver Function Tests:  Recent Labs Lab 06/25/16 1552  AST 19  ALT 24  ALKPHOS 52  BILITOT 0.9  PROT 7.2  ALBUMIN 4.1   No results for input(s): LIPASE, AMYLASE in the last 168 hours. No results for input(s): AMMONIA in the last 168 hours. Coagulation profile  Recent Labs Lab 06/26/16 1923  INR 1.07    CBC:  Recent Labs Lab 06/25/16 1552 06/27/16 1807 06/28/16 0159  WBC 9.1 9.8 8.5  NEUTROABS 5.2  --   --   HGB 13.3 13.8 12.8*  HCT 38.9* 40.6 38.2*  MCV 89.8 90.6 90.3  PLT 364 347 337   Cardiac Enzymes:  Recent Labs Lab 06/26/16 0318 06/26/16 1522 06/26/16 2204 06/27/16 0339 06/27/16 1306  TROPONINI <0.03 <0.03 0.93* 0.46* 0.19*   BNP: Invalid input(s): POCBNP CBG:  Recent Labs Lab 06/27/16 0620 06/27/16 1141 06/27/16 1600 06/27/16 2118 06/28/16 0625  GLUCAP 182* 134* 127* 195* 152*   D-Dimer No results for input(s): DDIMER in the last 72 hours. Hgb A1c No results for input(s): HGBA1C in the last 72 hours. Lipid Profile No results for input(s): CHOL, HDL, LDLCALC, TRIG, CHOLHDL, LDLDIRECT in the last 72 hours. Thyroid function studies No results for input(s): TSH, T4TOTAL, T3FREE, THYROIDAB in the last 72 hours.  Invalid input(s): FREET3 Anemia work up No results for input(s): VITAMINB12, FOLATE, FERRITIN, TIBC, IRON, RETICCTPCT in the last 72 hours. Microbiology No results found for this or any previous visit (from the past 240 hour(s)).   Discharge Instructions:   Discharge Instructions    AMB Referral to Cardiac Rehabilitation - Phase II    Complete by:  As directed    Diagnosis:   NSTEMI Coronary Stents     Amb Referral to Cardiac Rehabilitation    Complete by:  As directed    Diagnosis:  Coronary Stents   Diet - low sodium heart healthy    Complete by:  As directed    Diet Carb Modified     Complete by:  As directed    Discharge instructions    Complete by:  As directed    Hold metformin 48 hours after cath continue aspirin plus plavix for minimum of 3 months. After that would be okay to stop aspirin if necessary   Increase activity slowly    Complete by:  As directed      Allergies as of 06/28/2016      Reactions   Actos [pioglitazone] Other (See Comments)   Potential cause of DVT   Bee Venom Anaphylaxis   Klonopin [clonazepam] Anaphylaxis   Niacin Other (See Comments)   "makes his crazy"      Medication List    STOP taking these medications   canagliflozin 300 MG  Tabs tablet Commonly known as:  INVOKANA     TAKE these medications   acetaminophen 500 MG tablet Commonly known as:  TYLENOL Take 1,000 mg by mouth every 6 (six) hours as needed.   aspirin 81 MG chewable tablet Chew 1 tablet (81 mg total) by mouth daily.   clopidogrel 75 MG tablet Commonly known as:  PLAVIX Take 1 tablet (75 mg total) by mouth daily. Start taking on:  06/29/2016   cyanocobalamin 1000 MCG/ML injection Commonly known as:  (VITAMIN B-12) Inject 1,000 mcg into the muscle every 30 (thirty) days. Reported on 06/23/2015   diazepam 10 MG tablet Commonly known as:  VALIUM Take 10 mg by mouth 3 (three) times daily.   DULoxetine 30 MG capsule Commonly known as:  CYMBALTA Take 30 mg by mouth daily.   EPINEPHrine 0.3 mg/0.3 mL Soaj injection Commonly known as:  EPIPEN 2-PAK Inject 0.3 mLs (0.3 mg total) into the muscle once.   FLUoxetine 40 MG capsule Commonly known as:  PROZAC Take 40 mg by mouth daily.   JANUVIA 100 MG tablet Generic drug:  sitaGLIPtin TAKE 1 TABLET BY MOUTH EVERY DAY   losartan-hydrochlorothiazide 50-12.5 MG tablet Commonly known as:  HYZAAR Take 1 tablet by mouth daily.   metFORMIN 500 MG 24 hr tablet Commonly known as:  GLUCOPHAGE-XR TAKE 2 TABLETS BY MOUTH TWICE A DAY   nitroGLYCERIN 0.4 MG SL tablet Commonly known as:  NITROSTAT Place 1 tablet  (0.4 mg total) under the tongue every 5 (five) minutes as needed for chest pain.   rivaroxaban 20 MG Tabs tablet Commonly known as:  XARELTO TAKE 1 TABLET BY MOUTH EVERY DAY BEFORE SUPPER   simvastatin 40 MG tablet Commonly known as:  ZOCOR TAKE 1 TABLET (40 MG TOTAL) BY MOUTH AT BEDTIME.      Follow-up Information    Norma FredricksonLORI GERHARDT, NP Follow up on 07/12/2016.   Specialties:  Nurse Practitioner, Interventional Cardiology, Cardiology, Radiology Why:  at 9:00 for cardiology hospital follow up.  Contact information: [address] [place] [address] [phone]        Romero BellingELLISON, SEAN, MD Follow up in 1 week(s).   Specialty:  Endocrinology Why:  call to discuss invokana Contact information: [address] [place] [address] [phone]            Time coordinating discharge: 35 min  Signed:  JESSICA Juanetta GoslingU VANN   [facility] 06/28/2016, 5:42 PM

## 2016-06-28 NOTE — Progress Notes (Signed)
CARDIAC REHAB PHASE I   PRE:  Rate/Rhythm: 77 SR  BP:  Supine: 172/86  Sitting:   Standing:    SaO2: 98%RA  MODE:  Ambulation: 800 ft   POST:  Rate/Rhythm: 88 SR  BP:  Supine: 165/71  Sitting:   Standing:    SaO2: 100%RA [id number] Pt walked 800 ft on RA with steady gait. No CP and tolerated well. Education completed with pt and wife who voiced understanding. Stressed importance of taking brilinta with stent. Needs to see case manager re brilinta. Reviewed carb counting and heart healthy food choices. Discussed ex ed, risk factors, NTG use and CRP 2. Will refer to [place] Phase 2.    Luetta Nuttingharlene Dunlap, RN BSN  06/28/2016 9:19 AM

## 2016-06-28 NOTE — Progress Notes (Signed)
Progress Note  Patient Name: Xavier [name] Date of Encounter: 06/28/2016  Primary Cardiologist: Dr. Eden EmmsNishan  Subjective   Pt feeling well. Has been up ambulating in hall with CR. No chest pain or dyspnea. Right wrist without tenderness.  Inpatient Medications    Scheduled Meds: . aspirin  81 mg Oral Daily  . atropine  1 mg Intravenous Once  . [START ON 06/29/2016] clopidogrel  75 mg Oral Daily  . diazepam  10 mg Oral TID  . DULoxetine  30 mg Oral Daily  . famotidine  20 mg Oral BID  . FLUoxetine  40 mg Oral Daily  . losartan  50 mg Oral Daily   And  . hydrochlorothiazide  12.5 mg Oral Daily  . insulin aspart  0-15 Units Subcutaneous TID WC  . insulin aspart  0-5 Units Subcutaneous QHS  . linagliptin  5 mg Oral Daily  . rivaroxaban  20 mg Oral Q supper  . simvastatin  40 mg Oral QHS  . sodium chloride flush  3 mL Intravenous Q12H   Continuous Infusions: . sodium chloride Stopped (06/27/16 2000)   PRN Meds: sodium chloride, acetaminophen, morphine injection, nitroGLYCERIN, ondansetron (ZOFRAN) IV, sodium chloride flush   Vital Signs    Vitals:   06/27/16 2000 06/27/16 2100 06/27/16 2300 06/28/16 0800  BP: 129/85 129/85  (!) 172/86  Pulse: 71 71 69 70  Resp: 18 16 13 19   Temp:  97.5 F (36.4 C)  97.3 F (36.3 C)  TempSrc:  Oral  Oral  SpO2: 99% 100% 99% 97%  Weight:      Height:        Intake/Output Summary (Last 24 hours) at 06/28/16 0833 Last data filed at 06/28/16 0803  Gross per 24 hour  Intake          1556.36 ml  Output             3400 ml  Net         -1843.64 ml   Filed Weights   06/25/16 1224  Weight: 276 lb (125.2 kg)    Telemetry    NSR with rates in 60's-70s- Personally Reviewed  ECG    Sinus rhythm at 64 bpm, r wave smaller in V5, V6 could be lead placement - Personally Reviewed  Physical Exam   GEN: No acute distress.   Neck: No JVD Cardiac: RRR, no murmurs, rubs, or gallops.  Respiratory: Clear to auscultation  bilaterally. GI: Soft, nontender, non-distended  MS: No edema; No deformity. Neuro:  Nonfocal  Psych: Normal affect   Right wrist cath site without tenderness or hematoma   Labs    Chemistry Recent Labs Lab 06/25/16 1552 06/26/16 0318 06/26/16 1923 06/27/16 1807 06/28/16 0159  NA 137 135 135  --  134*  K 3.7 3.5 4.0  --  3.5  CL 100* 100* 100*  --  99*  CO2 26 25 25   --  24  GLUCOSE 126* 160* 147*  --  151*  BUN 14 15 18   --  12  CREATININE 0.98 1.11 1.06 0.93 0.87  CALCIUM 9.5 9.1 9.1  --  8.9  PROT 7.2  --   --   --   --   ALBUMIN 4.1  --   --   --   --   AST 19  --   --   --   --   ALT 24  --   --   --   --  ALKPHOS 52  --   --   --   --   BILITOT 0.9  --   --   --   --   GFRNONAA >60 >60 >60 >60 >60  GFRAA >60 >60 >60 >60 >60  ANIONGAP 11 10 10   --  11     Hematology Recent Labs Lab 06/25/16 1552 06/27/16 1807 06/28/16 0159  WBC 9.1 9.8 8.5  RBC 4.33 4.48 4.23  HGB 13.3 13.8 12.8*  HCT 38.9* 40.6 38.2*  MCV 89.8 90.6 90.3  MCH 30.7 30.8 30.3  MCHC 34.2 34.0 33.5  RDW 12.9 12.8 13.0  PLT 364 347 337    Cardiac Enzymes Recent Labs Lab 06/26/16 1522 06/26/16 2204 06/27/16 0339 06/27/16 1306  TROPONINI <0.03 0.93* 0.46* 0.19*    Recent Labs Lab 06/25/16 1557  TROPIPOC 0.00     BNP Recent Labs Lab 06/25/16 1552  BNP 6.9     DDimer No results for input(s): DDIMER in the last 168 hours.   Radiology    No results found.  Cardiac Studies   Procedures  06/27/2016  Coronary Stent Intervention  Intravascular Pressure Wire/FFR Study  Left Heart Cath and Coronary Angiography  Conclusion     The left ventricular systolic function is normal. The left ventricular ejection fraction is 55-65% by visual estimate.  LV end diastolic pressure is normal.  ________Pertinent Coronary Anatomy Findings__________  Dist LAD-1 lesion, 70 %stenosed. FFR 0.77 (Physiologically Significant)  A STENT SYNERGY DES 2.25X12 drug eluting stent was  successfully placed (post-dilated to 2.5 mm)  Post intervention, there is a 0% residual stenosis.    Successful FFR guided PCI of distal LAD focal 70-75% lesion with DES stent.  Otherwise no sniffing CAD noted.  Plan:  Return to nursing unit for ongoing care and TR band removal.  Would continue aspirin plus Brilinta for minimum of 3 months. After that would be okay to stop aspirin if necessary.  Continue risk factor modification by Cardiology Consultation Team  He Would Be Okay Discharge from a Cardiac Standpoint/Post PCI on February 14.      Patient Profile     51 y.o. malewith medical history significant of pernicious anemia, cataract of the right eye, coronary artery disease (treated medically), history of MI, hyperlipidemia, hypertension, DVT, history of pulmonary embolism, chronic back pain, sleep apnea on CPAP who comes to the emergency department with complaints of progressively worse dyspnea on exertion and decreased exercise capacity.  Assessment & Plan    CAD  -Taken to cath lab on 06/27/16 and DES placed to distal LAD for focal 70-75% lesion -continue aspirin plus Brilinta for minimum of 3 months. After that would be okay to stop aspirin if necessary. -Continue risk factor modification by Cardiology Consultation Team -OK for discharge today from cardiac standpoint  HTN -Well controlled. Continue home meds, Hyzaar. -DASH diet  DM -Hgb A1c 7.5 on 06/15/2016 -Low carb diet. Continue current medical therapy  DVT/PE -Continue Xarelto. Plavix and aspirin for stent protection. May stop aspirin in 3 months if necessary.  Signed, Berton Bon, NP  06/28/2016, 8:33 AM    Patient examined chart reviewed right radial A Use plavix for CAD given need for xarelto for DVT/PE Consider d/c aspirin in 3 months Ok for d/c today   [employer]

## 2016-07-11 ENCOUNTER — Ambulatory Visit (INDEPENDENT_AMBULATORY_CARE_PROVIDER_SITE_OTHER): Payer: Medicare Other | Admitting: Endocrinology

## 2016-07-11 ENCOUNTER — Encounter: Payer: Self-pay | Admitting: Endocrinology

## 2016-07-11 VITALS — BP 134/84 | HR 76 | Ht 72.0 in | Wt 274.0 lb

## 2016-07-11 DIAGNOSIS — I259 Chronic ischemic heart disease, unspecified: Secondary | ICD-10-CM | POA: Diagnosis not present

## 2016-07-11 DIAGNOSIS — M545 Low back pain, unspecified: Secondary | ICD-10-CM | POA: Insufficient documentation

## 2016-07-11 DIAGNOSIS — E119 Type 2 diabetes mellitus without complications: Secondary | ICD-10-CM

## 2016-07-11 MED ORDER — EMPAGLIFLOZIN 10 MG PO TABS: 10.0000 mg | ORAL_TABLET | Freq: Every day | ORAL | 11 refills | Status: DC

## 2016-07-11 NOTE — Progress Notes (Signed)
Subjective:    Patient ID: Xavier [name], male    DOB: 01/08/1966, 51 y.o.   MRN: [id number]  HPI Pt returns for f/u of diabetes mellitus:  DM type: 2 Dx'ed: 2005 Complications: mild CAD, and polyneuropathy.   Therapy: 2 oral meds DKA: never Severe hypoglycemia: never.   Pancreatitis: never.  Other: he did not tolerate pioglitizone (edema); he has never taken insulin, except in the hospital.   Interval history: no cbg record, but states cbg's are well-controlled.  He wants to take B-12 injections at home.  He also takes PO 1 mg qd.   Past Medical History:  Diagnosis Date  . ANEMIA, PERNICIOUS 04/03/2007  . Anxiety   . Arthritis    "left hand" (06/26/2016)  . Chronic lower back pain   . Complication of anesthesia    "w/hand OR; spouse had to go into PACU & nudge me q little bit; just wouldn't wake up"  . CORONARY ARTERY DISEASE 12/05/2006  . Depression   . DIABETES MELLITUS, TYPE II dx'd 2001  . DVT (deep venous thrombosis) (HCC) 2016   BLE  . GERD (gastroesophageal reflux disease)   . History of blood transfusion 2001   "when I had my hand OR"  . History of gout   . History of kidney stones   . HYPERCHOLESTEROLEMIA 07/17/2007  . HYPERTENSION 12/05/2006  . Learning disability   . Migraine    "1-2/month" (06/26/2016)  . Myocardial infarction ~ 2003  . OSA on CPAP   . PE (pulmonary embolism) 2016  . Stroke [facility](HCC) ~ 2006   denies residual on 06/26/2016  . TIA (transient ischemic attack) ?06/25/2016  . VISUAL ACUITY, DECREASED, RIGHT EYE 02/19/2009    Past Surgical History:  Procedure Laterality Date  . CARDIAC CATHETERIZATION  1990s; 2012  . CATARACT EXTRACTION W/ INTRAOCULAR LENS IMPLANT Right 2017  . CORONARY STENT INTERVENTION N/A 06/27/2016   Procedure: Coronary Stent Intervention;  Surgeon: Marykay Lexavid W Harding, MD;  [facility];  Service: Cardiovascular;  Laterality: N/A;  . EYE SURGERY Right    Cataract  . INGUINAL HERNIA REPAIR Bilateral 1990s  .  INTRAVASCULAR PRESSURE WIRE/FFR STUDY N/A 06/27/2016   Procedure: Intravascular Pressure Wire/FFR Study;  Surgeon: Marykay Lexavid W Harding, MD;  [facility];  Service: Cardiovascular;  Laterality: N/A;  . LEFT HEART CATH AND CORONARY ANGIOGRAPHY N/A 06/27/2016   Procedure: Left Heart Cath and Coronary Angiography;  Surgeon: Marykay Lexavid W Harding, MD;  [facility];  Service: Cardiovascular;  Laterality: N/A;  . REATTACHMENT HAND Left ~ 2001   w/carpal tunnel release  . UMBILICAL HERNIA REPAIR  1990s   w/IHR    Social History   Social History  . Marital status: Married    Spouse name: N/A  . Number of children: N/A  . Years of education: N/A   Occupational History  . Disabled Disabled   Social History Main Topics  . Smoking status: Former Smoker    Packs/day: 0.10    Years: 3.00    Types: Cigarettes    Quit date: 05/15/1984  . Smokeless tobacco: Never Used  . Alcohol use Yes     Comment: 06/26/2016 "once/year I'll have few drinks on vacation"  . Drug use: No  . Sexual activity: No   Other Topics Concern  . Not on file   Social History Narrative   Disabled due to left wrist injury at work    Current Outpatient Prescriptions on File Prior  to Visit  Medication Sig Dispense Refill  . acetaminophen (TYLENOL) 500 MG tablet Take 1,000 mg by mouth every 6 (six) hours as needed.    Marland Kitchen aspirin 81 MG chewable tablet Chew 1 tablet (81 mg total) by mouth daily.    . clopidogrel (PLAVIX) 75 MG tablet Take 1 tablet (75 mg total) by mouth daily. 30 tablet 0  . diazepam (VALIUM) 10 MG tablet Take 10 mg by mouth 3 (three) times daily.    . DULoxetine (CYMBALTA) 30 MG capsule Take 30 mg by mouth daily.      Marland Kitchen EPINEPHrine (EPIPEN 2-PAK) 0.3 mg/0.3 mL IJ SOAJ injection Inject 0.3 mLs (0.3 mg total) into the muscle once. 2 Device 5  . FLUoxetine (PROZAC) 40 MG capsule Take 40 mg by mouth daily.      Marland Kitchen JANUVIA 100 MG tablet TAKE 1 TABLET BY MOUTH EVERY DAY 30 tablet 3  .  losartan-hydrochlorothiazide (HYZAAR) 50-12.5 MG tablet Take 1 tablet by mouth daily. 90 tablet 1  . metFORMIN (GLUCOPHAGE-XR) 500 MG 24 hr tablet TAKE 2 TABLETS BY MOUTH TWICE A DAY 120 tablet 2  . nitroGLYCERIN (NITROSTAT) 0.4 MG SL tablet Place 1 tablet (0.4 mg total) under the tongue every 5 (five) minutes as needed for chest pain. 10 tablet 0  . rivaroxaban (XARELTO) 20 MG TABS tablet TAKE 1 TABLET BY MOUTH EVERY DAY BEFORE SUPPER 30 tablet 0  . simvastatin (ZOCOR) 40 MG tablet TAKE 1 TABLET (40 MG TOTAL) BY MOUTH AT BEDTIME. 30 tablet 10   No current facility-administered medications on file prior to visit.     Allergies  Allergen Reactions  . Actos [Pioglitazone] Other (See Comments)    Potential cause of DVT  . Bee Venom Anaphylaxis  . Klonopin [Clonazepam] Anaphylaxis  . Invokana [Canagliflozin] Nausea Only    Nausea/dizzy/bad taste in mouth  . Niacin Other (See Comments)    "makes his crazy"    Family History  Problem Relation Age of Onset  . COPD Mother   . Lymphoma Father     BP 134/84   Pulse 76   Ht 6' (1.829 m)   Wt 274 lb (124.3 kg)   SpO2 97%   BMI 37.16 kg/m    Review of Systems Low back pain persists.      Objective:   Physical Exam VITAL SIGNS:  See vs page GENERAL: no distress Pulses: dorsalis pedis intact bilat.   MSK: no deformity of the feet CV: no leg edema, but there are bilat vv's.   Skin:  no ulcer on the feet.  normal color and temp on the feet. Neuro: sensation is intact to touch on the feet  Lab Results  Component Value Date   HGBA1C 7.5 06/15/2016      Assessment & Plan:  Type 2 DM: he needs increased rx.  there is no reason to avoid invokana, but we'll change at cardiol's request.   B-12 deficiency: he can have a trial of oral rx.   Edema: resolved off pioglitizone.    Patient is advised the following: Patient Instructions  I have sent a prescription to your pharmacy, to add "jardiance." Please see Dr Katrinka Blazing, about your  back.  you will receive a phone call, about a day and time for an appointment. Try taking just the B-12 by mouth.  We check then blood test next time.

## 2016-07-11 NOTE — Progress Notes (Signed)
CARDIOLOGY OFFICE NOTE  Date:  07/12/2016    Xavier [name] Date of Birth: [birth year] Medical Record #[id number]  PCP:  Romero Belling, MD  Cardiologist:  Eden Emms   Chief Complaint  Patient presents with  . Coronary Artery Disease    Post hospital visit - seen for Dr. Eden Emms    History of Present Illness: Xavier [name] is a 51 y.o. male who presents today for a post hospital visit. Seen for Dr. Eden Emms.   He has a medical history significant of pernicious anemia, cataract of the right eye, coronary artery disease (treated medically), history of prior MI, hyperlipidemia, hypertension, DVT and prior history of pulmonary embolism - on Xarelto, chronic back pain, sleep apnea on CPAP. FH + for DVT with his mother and sister. Has elected to stay on chronic DOAC therapy per pulmonary note.   Presented earlier this month with complaints of progressively worse dyspnea on exertion and decreased exercise capacity, to the point in the walking between rooms at home triggers symptoms, for one month. The patient also states he has been having chest pressure, nonradiating, associated with mild dizziness, diaphoresis, palpitations and panic attacks which usually gets better with rest. Subsequently admitted. Seen by cardiology and was taken to the cath lab and had DES to the distal LAD for a focal 70 to 75% lesion. He is on triple therapy - aspirin/Plavix/Xarelto - to stop aspirin after 3 months.   Comes in today. Here with his wife. Apparently had Jardiance added yesterday - but has not started - was told that we might not want him on this??? He feels much better since admission. No more chest pain. Breathing is good. Now walking. Has no real complaint. Tolerating his medicines. Understands need for uninterrupted antiplatelet therapy.   Past Medical History:  Diagnosis Date  . ANEMIA, PERNICIOUS 04/03/2007  . Anxiety   . Arthritis    "left hand" (06/26/2016)  . Chronic lower back pain   .  Complication of anesthesia    "w/hand OR; spouse had to go into PACU & nudge me q little bit; just wouldn't wake up"  . CORONARY ARTERY DISEASE 12/05/2006  . Depression   . DIABETES MELLITUS, TYPE II dx'd 2001  . DVT (deep venous thrombosis) (HCC) 2016   BLE  . GERD (gastroesophageal reflux disease)   . History of blood transfusion 2001   "when I had my hand OR"  . History of gout   . History of kidney stones   . HYPERCHOLESTEROLEMIA 07/17/2007  . HYPERTENSION 12/05/2006  . Learning disability   . Migraine    "1-2/month" (06/26/2016)  . Myocardial infarction ~ 2003  . OSA on CPAP   . PE (pulmonary embolism) 2016  . Stroke [facility]) ~ 2006   denies residual on 06/26/2016  . TIA (transient ischemic attack) ?06/25/2016  . VISUAL ACUITY, DECREASED, RIGHT EYE 02/19/2009    Past Surgical History:  Procedure Laterality Date  . CARDIAC CATHETERIZATION  1990s; 2012  . CATARACT EXTRACTION W/ INTRAOCULAR LENS IMPLANT Right 2017  . CORONARY STENT INTERVENTION N/A 06/27/2016   Procedure: Coronary Stent Intervention;  Surgeon: Marykay Lex, MD;  Location: [facility] INVASIVE CV LAB;  Service: Cardiovascular;  Laterality: N/A;  . EYE SURGERY Right    Cataract  . INGUINAL HERNIA REPAIR Bilateral 1990s  . INTRAVASCULAR PRESSURE WIRE/FFR STUDY N/A 06/27/2016   Procedure: Intravascular Pressure Wire/FFR Study;  Surgeon: Marykay Lex, MD;  Location: [facility] INVASIVE CV LAB;  Service: Cardiovascular;  Laterality: N/A;  . LEFT HEART CATH AND CORONARY ANGIOGRAPHY N/A 06/27/2016   Procedure: Left Heart Cath and Coronary Angiography;  Surgeon: Marykay Lex, MD;  Location: [facility] INVASIVE CV LAB;  Service: Cardiovascular;  Laterality: N/A;  . REATTACHMENT HAND Left ~ 2001   w/carpal tunnel release  . UMBILICAL HERNIA REPAIR  1990s   w/IHR     Medications: Current Outpatient Prescriptions  Medication Sig Dispense Refill  . acetaminophen (TYLENOL) 500 MG tablet Take 1,000 mg by mouth every 6 (six) hours as needed.      Marland Kitchen aspirin 81 MG chewable tablet Chew 1 tablet (81 mg total) by mouth daily.    . clopidogrel (PLAVIX) 75 MG tablet Take 1 tablet (75 mg total) by mouth daily. 30 tablet 0  . diazepam (VALIUM) 10 MG tablet Take 10 mg by mouth 3 (three) times daily.    . DULoxetine (CYMBALTA) 30 MG capsule Take 30 mg by mouth daily.      Marland Kitchen EPINEPHrine (EPIPEN 2-PAK) 0.3 mg/0.3 mL IJ SOAJ injection Inject 0.3 mLs (0.3 mg total) into the muscle once. 2 Device 5  . FLUoxetine (PROZAC) 40 MG capsule Take 40 mg by mouth daily.      Marland Kitchen JANUVIA 100 MG tablet TAKE 1 TABLET BY MOUTH EVERY DAY 30 tablet 3  . losartan-hydrochlorothiazide (HYZAAR) 50-12.5 MG tablet Take 1 tablet by mouth daily. 90 tablet 1  . metFORMIN (GLUCOPHAGE-XR) 500 MG 24 hr tablet TAKE 2 TABLETS BY MOUTH TWICE A DAY 120 tablet 2  . nitroGLYCERIN (NITROSTAT) 0.4 MG SL tablet Place 1 tablet (0.4 mg total) under the tongue every 5 (five) minutes as needed for chest pain. 10 tablet 0  . rivaroxaban (XARELTO) 20 MG TABS tablet TAKE 1 TABLET BY MOUTH EVERY DAY BEFORE SUPPER 30 tablet 0  . simvastatin (ZOCOR) 40 MG tablet TAKE 1 TABLET (40 MG TOTAL) BY MOUTH AT BEDTIME. 30 tablet 10  . empagliflozin (JARDIANCE) 10 MG TABS tablet Take 10 mg by mouth daily. (Patient not taking: Reported on 07/12/2016) 30 tablet 11   No current facility-administered medications for this visit.     Allergies: Allergies  Allergen Reactions  . Actos [Pioglitazone] Other (See Comments)    Potential cause of DVT  . Bee Venom Anaphylaxis  . Klonopin [Clonazepam] Anaphylaxis  . Invokana [Canagliflozin] Nausea Only    Nausea/dizzy/bad taste in mouth  . Niacin Other (See Comments)    "makes his crazy"    Social History: The patient  reports that he quit smoking about 32 years ago. His smoking use included Cigarettes. He has a 0.30 pack-year smoking history. He has never used smokeless tobacco. He reports that he drinks alcohol. He reports that he does not use drugs.   Family  History: The patient's family history includes COPD in his mother; Lymphoma in his father.   Review of Systems: Please see the history of present illness.   Otherwise, the review of systems is positive for none.   All other systems are reviewed and negative.   Physical Exam: VS:  BP 120/80   Pulse 78   Ht 6' (1.829 m)   Wt 272 lb (123.4 kg)   SpO2 97%   BMI 36.89 kg/m  .  BMI Body mass index is 36.89 kg/m.  Wt Readings from Last 3 Encounters:  07/12/16 272 lb (123.4 kg)  07/11/16 274 lb (124.3 kg)  06/25/16 276 lb (125.2 kg)    General: Pleasant. Obese male who is alert and in  no acute distress.   HEENT: Normal.  Neck: Supple, no JVD, carotid bruits, or masses noted.  Cardiac: Regular rate and rhythm. No murmurs, rubs, or gallops. No edema.  Respiratory:  Lungs are clear to auscultation bilaterally with normal work of breathing.  GI: Soft and nontender.  MS: No deformity or atrophy. Gait and ROM intact.  Skin: Warm and dry. Color is normal.  Neuro:  Strength and sensation are intact and no gross focal deficits noted.  Psych: Alert, appropriate and with normal affect. Right wrist looks fine.  LABORATORY DATA:  EKG:  EKG is not ordered today.   Lab Results  Component Value Date   WBC 8.5 06/28/2016   HGB 12.8 (L) 06/28/2016   HCT 38.2 (L) 06/28/2016   PLT 337 06/28/2016   GLUCOSE 151 (H) 06/28/2016   CHOL 131 06/15/2016   TRIG 212.0 (H) 06/15/2016   HDL 46.60 06/15/2016   LDLDIRECT 63.0 06/15/2016   LDLCALC (H) 06/11/2010    120        Total Cholesterol/HDL:CHD Risk Coronary Heart Disease Risk Table                     Men   Women  1/2 Average Risk   3.4   3.3  Average Risk       5.0   4.4  2 X Average Risk   9.6   7.1  3 X Average Risk  23.4   11.0        Use the calculated Patient Ratio above and the CHD Risk Table to determine the patient's CHD Risk.        ATP III CLASSIFICATION (LDL):  <100     mg/dL   Optimal  161-096  mg/dL   Near or Above                     Optimal  130-159  mg/dL   Borderline  045-409  mg/dL   High  >811     mg/dL   Very High   ALT 24 91/47/8295   AST 19 06/25/2016   NA 134 (L) 06/28/2016   K 3.5 06/28/2016   CL 99 (L) 06/28/2016   CREATININE 0.87 06/28/2016   BUN 12 06/28/2016   CO2 24 06/28/2016   TSH 1.264 06/25/2016   PSA 0.21 09/24/2012   INR 1.07 06/26/2016   HGBA1C 7.5 06/15/2016   MICROALBUR 6.8 (H) 06/15/2016    BNP (last 3 results)  Recent Labs  06/25/16 1552  BNP 6.9    ProBNP (last 3 results) No results for input(s): PROBNP in the last 8760 hours.   Other Studies Reviewed Today:  Cardiac Cath Conclusion 06/2016     The left ventricular systolic function is normal. The left ventricular ejection fraction is 55-65% by visual estimate.  LV end diastolic pressure is normal.  ________Pertinent Coronary Anatomy Findings__________  Dist LAD-1 lesion, 70 %stenosed. FFR 0.77 (Physiologically Significant)  A STENT SYNERGY DES 2.25X12 drug eluting stent was successfully placed (post-dilated to 2.5 mm)  Post intervention, there is a 0% residual stenosis.    Successful FFR guided PCI of distal LAD focal 70-75% lesion with DES stent.  Otherwise no sniffing CAD noted.  Plan:  Return to nursing unit for ongoing care and TR band removal.  Would continue aspirin plus Brilinta for minimum of 3 months. After that would be okay to stop aspirin if necessary.  Continue risk factor modification by Cardiology Consultation Team  He Would Be Okay Discharge from a Cardiac Standpoint/Post PCI on February 14.    Bryan Lemmaavid Harding, MD     Assessment/Plan:  1. CAD - recent admission for chest pain - s/p cath and subsequent PCI with DES to the distal LAD for focal 70 to 75% lesion significant by FFR - on triple therapy - plan to stop aspirin after 3 months of therapy - he is doing well clinically. EF is normal. Baseline lab today. Encouraged CV risk factor modification - he has a good  understanding of what he needs to be doing.   2. HTN - BP ok on current regimen.   3. HLD - on statin therapy.   4. 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   5. DM - followed by PCP - ok to start Jardiance - would monitor renal function.   Current medicines are reviewed with the patient today.  The patient does not have concerns regarding medicines other than what has been noted above.  The following changes have been made:  See above.  Labs/ tests ordered today include:    Orders Placed This Encounter  Procedures  . Basic metabolic panel  . CBC     Disposition:   FU with Dr. Eden EmmsNishan in 2 1/2 months.   Patient is agreeable to this plan and will call if any problems develop in the interim.   SignedNorma Fredrickson: LORI GERHARDT, NP  07/12/2016 9:26 AM  [facility] [address] [place], [place]  [address] Phone: [phone] Fax: [phone]

## 2016-07-11 NOTE — Patient Instructions (Addendum)
I have sent a prescription to your pharmacy, to add "jardiance." Please see Dr Katrinka BlazingSmith, about your back.  you will receive a phone call, about a day and time for an appointment. Try taking just the B-12 by mouth.  We check then blood test next time.

## 2016-07-12 ENCOUNTER — Encounter: Payer: Self-pay | Admitting: Nurse Practitioner

## 2016-07-12 ENCOUNTER — Ambulatory Visit (INDEPENDENT_AMBULATORY_CARE_PROVIDER_SITE_OTHER): Payer: Medicare Other | Admitting: Nurse Practitioner

## 2016-07-12 VITALS — BP 120/80 | HR 78 | Ht 72.0 in | Wt 272.0 lb

## 2016-07-12 DIAGNOSIS — Z955 Presence of coronary angioplasty implant and graft: Secondary | ICD-10-CM

## 2016-07-12 DIAGNOSIS — Z7901 Long term (current) use of anticoagulants: Secondary | ICD-10-CM | POA: Diagnosis not present

## 2016-07-12 DIAGNOSIS — I259 Chronic ischemic heart disease, unspecified: Secondary | ICD-10-CM | POA: Diagnosis not present

## 2016-07-12 DIAGNOSIS — Z79899 Other long term (current) drug therapy: Secondary | ICD-10-CM | POA: Diagnosis not present

## 2016-07-12 LAB — BASIC METABOLIC PANEL
BUN/Creatinine Ratio: 16 (ref 9–20)
BUN: 17 mg/dL (ref 6–24)
CO2: 24 mmol/L (ref 18–29)
Calcium: 9.7 mg/dL (ref 8.7–10.2)
Chloride: 96 mmol/L (ref 96–106)
Creatinine, Ser: 1.09 mg/dL (ref 0.76–1.27)
GFR calc Af Amer: 91 mL/min/{1.73_m2} (ref >59)
GFR calc non Af Amer: 79 mL/min/{1.73_m2} (ref >59)
Glucose: 157 mg/dL — ABNORMAL HIGH (ref 65–99)
Potassium: 4.7 mmol/L (ref 3.5–5.2)
Sodium: 137 mmol/L (ref 134–144)

## 2016-07-12 LAB — CBC
Hematocrit: 39.6 % (ref 37.5–51.0)
Hemoglobin: 14 g/dL (ref 13.0–17.7)
MCH: 31.7 pg (ref 26.6–33.0)
MCHC: 35.4 g/dL (ref 31.5–35.7)
MCV: 90 fL (ref 79–97)
Platelets: 399 10*3/uL — ABNORMAL HIGH (ref 150–379)
RBC: 4.42 x10E6/uL (ref 4.14–5.80)
RDW: 12.9 % (ref 12.3–15.4)
WBC: 7 10*3/uL (ref 3.4–10.8)

## 2016-07-12 NOTE — Patient Instructions (Addendum)
We will be checking the following labs today - BMET and CBC   Medication Instructions:    Continue with your current medicines.     Testing/Procedures To Be Arranged:  N/A  Follow-Up:   See Dr. Eden EmmsNishan in about 2 1/2 months    Other Special Instructions:   Regular exercise  Cut back on your calories    If you need a refill on your cardiac medications before your next appointment, please call your pharmacy.   Call the [facility] office at [phone] if you have any questions, problems or concerns.

## 2016-07-14 ENCOUNTER — Other Ambulatory Visit: Payer: Self-pay | Admitting: Endocrinology

## 2016-07-24 ENCOUNTER — Other Ambulatory Visit: Payer: Self-pay | Admitting: Cardiovascular Disease

## 2016-07-25 ENCOUNTER — Telehealth (HOSPITAL_COMMUNITY): Payer: Self-pay | Admitting: Endocrinology

## 2016-07-25 NOTE — Telephone Encounter (Signed)
Verified Medicare A/B insurance benefits through Passport Reference [id number]... KJ

## 2016-08-01 ENCOUNTER — Other Ambulatory Visit: Payer: Self-pay

## 2016-08-01 MED ORDER — SITAGLIPTIN PHOSPHATE 100 MG PO TABS: 100.0000 mg | ORAL_TABLET | Freq: Every day | ORAL | 1 refills | Status: DC

## 2016-08-02 MED ORDER — SITAGLIPTIN PHOSPHATE 100 MG PO TABS: 100.0000 mg | ORAL_TABLET | Freq: Every day | ORAL | 1 refills | Status: DC

## 2016-08-02 NOTE — Telephone Encounter (Signed)
[facility] calling om the status of a fax over a request  sitaGLIPtin (JANUVIA) 100 MG tablet 90 tablet   [id number] - [place], [place] - [address]

## 2016-08-02 NOTE — Telephone Encounter (Signed)
Refill for a 90 day supply of januvia submitted.

## 2016-08-10 ENCOUNTER — Telehealth: Payer: Self-pay | Admitting: Pulmonary Disease

## 2016-08-10 MED ORDER — RIVAROXABAN 20 MG PO TABS: ORAL_TABLET | ORAL | 5 refills | Status: DC

## 2016-08-10 NOTE — Telephone Encounter (Signed)
Rx refill has been sent to preferred pharmacy, they are aware. Nothing further needed at this time.

## 2016-08-18 ENCOUNTER — Telehealth (HOSPITAL_COMMUNITY): Payer: Self-pay | Admitting: Endocrinology

## 2016-08-18 NOTE — Telephone Encounter (Signed)
I called and left message on patient voicemail to call office about scheduling for Cardiac Rehab program. Contact information left on patient voicemail. ° ° °

## 2016-08-19 ENCOUNTER — Other Ambulatory Visit: Payer: Self-pay | Admitting: Endocrinology

## 2016-08-21 ENCOUNTER — Encounter (HOSPITAL_COMMUNITY): Payer: Self-pay | Admitting: Endocrinology

## 2016-08-21 NOTE — Progress Notes (Signed)
Mailed letter with CRP to pt and sent msg through my chart... KJ

## 2016-08-29 ENCOUNTER — Other Ambulatory Visit: Payer: Self-pay

## 2016-08-29 MED ORDER — GLUCOSE BLOOD VI STRP: ORAL_STRIP | 2 refills | Status: DC

## 2016-09-12 ENCOUNTER — Encounter: Payer: Self-pay | Admitting: Endocrinology

## 2016-09-12 ENCOUNTER — Ambulatory Visit (INDEPENDENT_AMBULATORY_CARE_PROVIDER_SITE_OTHER): Payer: Medicare Other | Admitting: Endocrinology

## 2016-09-12 ENCOUNTER — Telehealth (HOSPITAL_COMMUNITY): Payer: Self-pay | Admitting: Endocrinology

## 2016-09-12 ENCOUNTER — Encounter: Payer: Self-pay | Admitting: Nurse Practitioner

## 2016-09-12 VITALS — BP 138/82 | HR 73 | Ht 72.0 in | Wt 267.0 lb

## 2016-09-12 DIAGNOSIS — Z125 Encounter for screening for malignant neoplasm of prostate: Secondary | ICD-10-CM | POA: Diagnosis not present

## 2016-09-12 DIAGNOSIS — E7800 Pure hypercholesterolemia, unspecified: Secondary | ICD-10-CM

## 2016-09-12 DIAGNOSIS — D510 Vitamin B12 deficiency anemia due to intrinsic factor deficiency: Secondary | ICD-10-CM | POA: Diagnosis not present

## 2016-09-12 DIAGNOSIS — I259 Chronic ischemic heart disease, unspecified: Secondary | ICD-10-CM | POA: Diagnosis not present

## 2016-09-12 DIAGNOSIS — E119 Type 2 diabetes mellitus without complications: Secondary | ICD-10-CM | POA: Diagnosis not present

## 2016-09-12 DIAGNOSIS — I2510 Atherosclerotic heart disease of native coronary artery without angina pectoris: Secondary | ICD-10-CM

## 2016-09-12 DIAGNOSIS — I10 Essential (primary) hypertension: Secondary | ICD-10-CM

## 2016-09-12 LAB — VITAMIN B12: Vitamin B-12: 243 pg/mL (ref 211–911)

## 2016-09-12 LAB — PSA, MEDICARE: PSA: 0.13 ng/ml (ref 0.10–4.00)

## 2016-09-12 LAB — POCT GLYCOSYLATED HEMOGLOBIN (HGB A1C): Hemoglobin A1C: 6.8

## 2016-09-12 MED ORDER — EPINEPHRINE 0.3 MG/0.3ML IJ SOAJ: 0.3000 mg | Freq: Once | INTRAMUSCULAR | 5 refills | Status: DC

## 2016-09-12 NOTE — Progress Notes (Signed)
Subjective:    Patient ID: Xavier [name], male    DOB: 11-Oct-1965, 51 y.o.   MRN: [id number]  HPI Pt returns for f/u of diabetes mellitus:  DM type: 2 Dx'ed: 2005 Complications: mild CAD, and polyneuropathy.   Therapy: 3 oral meds DKA: never Severe hypoglycemia: never.   Pancreatitis: never.  Other: he did not tolerate pioglitizone (edema); he has never taken insulin, except in the hospital.  Interval history: no cbg record, but states cbg's are well-controlled.   He is off B-12 injections.  He takes PO 1 mg qd.   He has used epipen twice in the past year.   Past Medical History:  Diagnosis Date  . ANEMIA, PERNICIOUS 04/03/2007  . Anxiety   . Arthritis    "left hand" (06/26/2016)  . Chronic lower back pain   . Complication of anesthesia    "w/hand OR; spouse had to go into PACU & nudge me q little bit; just wouldn't wake up"  . CORONARY ARTERY DISEASE 12/05/2006  . Depression   . DIABETES MELLITUS, TYPE II dx'd 2001  . DVT (deep venous thrombosis) (HCC) 2016   BLE  . GERD (gastroesophageal reflux disease)   . History of blood transfusion 2001   "when I had my hand OR"  . History of gout   . History of kidney stones   . HYPERCHOLESTEROLEMIA 07/17/2007  . HYPERTENSION 12/05/2006  . Learning disability   . Migraine    "1-2/month" (06/26/2016)  . Myocardial infarction (HCC) ~ 2003  . OSA on CPAP   . PE (pulmonary embolism) 2016  . Stroke [facility]) ~ 2006   denies residual on 06/26/2016  . TIA (transient ischemic attack) ?06/25/2016  . VISUAL ACUITY, DECREASED, RIGHT EYE 02/19/2009    Past Surgical History:  Procedure Laterality Date  . CARDIAC CATHETERIZATION  1990s; 2012  . CATARACT EXTRACTION W/ INTRAOCULAR LENS IMPLANT Right 2017  . CORONARY STENT INTERVENTION N/A 06/27/2016   Procedure: Coronary Stent Intervention;  Surgeon: Marykay Lex, MD;  [facility]: [facility] INVASIVE CV LAB;  Service: Cardiovascular;  Laterality: N/A;  . EYE SURGERY Right    Cataract  . INGUINAL HERNIA  REPAIR Bilateral 1990s  . INTRAVASCULAR PRESSURE WIRE/FFR STUDY N/A 06/27/2016   Procedure: Intravascular Pressure Wire/FFR Study;  Surgeon: Marykay Lex, MD;  Location: [facility] INVASIVE CV LAB;  Service: Cardiovascular;  Laterality: N/A;  . LEFT HEART CATH AND CORONARY ANGIOGRAPHY N/A 06/27/2016   Procedure: Left Heart Cath and Coronary Angiography;  Surgeon: Marykay Lex, MD;  Location: [facility] INVASIVE CV LAB;  Service: Cardiovascular;  Laterality: N/A;  . REATTACHMENT HAND Left ~ 2001   w/carpal tunnel release  . UMBILICAL HERNIA REPAIR  1990s   w/IHR    Social History   Social History  . Marital status: Married    Spouse name: N/A  . Number of children: N/A  . Years of education: N/A   Occupational History  . Disabled Disabled   Social History Main Topics  . Smoking status: Former Smoker    Packs/day: 0.10    Years: 3.00    Types: Cigarettes    Quit date: 05/15/1984  . Smokeless tobacco: Never Used  . Alcohol use Yes     Comment: 06/26/2016 "once/year I'll have few drinks on vacation"  . Drug use: No  . Sexual activity: No   Other Topics Concern  . Not on file   Social History Narrative   Disabled due to left wrist injury at work  Current Outpatient Prescriptions on File Prior to Visit  Medication Sig Dispense Refill  . acetaminophen (TYLENOL) 500 MG tablet Take 1,000 mg by mouth every 6 (six) hours as needed.    Marland Kitchen aspirin 81 MG chewable tablet Chew 1 tablet (81 mg total) by mouth daily.    . clopidogrel (PLAVIX) 75 MG tablet TAKE 1 TABLET BY MOUTH DAILY 30 tablet 11  . diazepam (VALIUM) 10 MG tablet Take 10 mg by mouth 3 (three) times daily.    . DULoxetine (CYMBALTA) 30 MG capsule Take 30 mg by mouth daily.      . empagliflozin (JARDIANCE) 10 MG TABS tablet Take 10 mg by mouth daily. 30 tablet 11  . FLUoxetine (PROZAC) 40 MG capsule Take 40 mg by mouth daily.      Marland Kitchen glucose blood (ONETOUCH VERIO) test strip Use to check blood sugar 1 time per day. DX CODE: E11.9 100  each 2  . losartan-hydrochlorothiazide (HYZAAR) 50-12.5 MG tablet Take 1 tablet by mouth daily. 90 tablet 1  . metFORMIN (GLUCOPHAGE-XR) 500 MG 24 hr tablet TAKE 2 TABLETS BY MOUTH TWICE A DAY 120 tablet 2  . nitroGLYCERIN (NITROSTAT) 0.4 MG SL tablet Place 1 tablet (0.4 mg total) under the tongue every 5 (five) minutes as needed for chest pain. 10 tablet 0  . rivaroxaban (XARELTO) 20 MG TABS tablet TAKE 1 TABLET BY MOUTH EVERY DAY BEFORE SUPPER 30 tablet 5  . simvastatin (ZOCOR) 40 MG tablet TAKE 1 TABLET (40 MG TOTAL) BY MOUTH AT BEDTIME. 30 tablet 10  . sitaGLIPtin (JANUVIA) 100 MG tablet Take 1 tablet (100 mg total) by mouth daily. 90 tablet 1   No current facility-administered medications on file prior to visit.     Allergies  Allergen Reactions  . Actos [Pioglitazone] Other (See Comments)    Potential cause of DVT  . Bee Venom Anaphylaxis  . Klonopin [Clonazepam] Anaphylaxis  . Invokana [Canagliflozin] Nausea Only    Nausea/dizzy/bad taste in mouth  . Niacin Other (See Comments)    "makes his crazy"    Family History  Problem Relation Age of Onset  . COPD Mother   . Lymphoma Father     BP 138/82   Pulse 73   Ht 6' (1.829 m)   Wt 267 lb (121.1 kg)   SpO2 97%   BMI 36.21 kg/m    Review of Systems He denies hypoglycemia    Objective:   Physical Exam VITAL SIGNS:  See vs page GENERAL: no distress Pulses: dorsalis pedis intact bilat.   MSK: no deformity of the feet.  CV: no leg edema.  Skin:  no ulcer on the feet.  normal color and temp on the feet. Neuro: sensation is intact to touch on the feet  B-12=243  a1c=6.8%    Assessment & Plan:  Type 2 DM: well-controlled. beesting allergy, recurrent but well-controlled B-12 deficiency: well-replaced on PO, but he needs to maintain level to avoid needing to resume injections.    Patient Instructions  I have sent a prescription to your pharmacy, to refill the epipen. Please continue the same medications blood  tests are requested for you today.  We'll let you know about the results.

## 2016-09-12 NOTE — Patient Instructions (Signed)
I have sent a prescription to your pharmacy, to refill the epipen. Please continue the same medications blood tests are requested for you today.  We'll let you know about the results.

## 2016-09-18 NOTE — Progress Notes (Signed)
CARDIOLOGY OFFICE NOTE  Date:  09/25/2016    Xavier [name] Date of Birth: December 05, 1965 Medical Record #[id number]  PCP:  Romero Belling, MD  Cardiologist:  Eden Emms   Chief Complaint  Patient presents with  . Ischemic heart disease    History of Present Illness: Xavier [name] is a 51 y.o. male who presents today for f/u of CAD   He has a medical history significant of pernicious anemia, cataract of the right eye, coronary artery disease (treated medically), history of prior MI, hyperlipidemia, hypertension, DVT and prior history of pulmonary embolism - on Xarelto, chronic back pain, sleep apnea on CPAP. FH + for DVT with his mother and sister. Has elected to stay on chronic DOAC therapy per pulmonary note.   Presented February 2018  with complaints of progressively worse dyspnea on exertion and decreased exercise capacity, to the point in the walking between rooms at home triggers symptoms, for one month. The patient also states he has been having chest pressure, nonradiating, associated with mild dizziness, diaphoresis, palpitations and panic attacks which usually gets better with rest. Subsequently admitted. With diagnostic cath  Intervention  DES to the distal LAD for a focal 70 to 75% lesion. He is on triple therapy - aspirin/Plavix/Xarelto - to stop aspirin after 3 months. End of May   Jardiance started by primary for DM   Past Medical History:  Diagnosis Date  . ANEMIA, PERNICIOUS 04/03/2007  . Anxiety   . Arthritis    "left hand" (06/26/2016)  . Chronic lower back pain   . Complication of anesthesia    "w/hand OR; spouse had to go into PACU & nudge me q little bit; just wouldn't wake up"  . CORONARY ARTERY DISEASE 12/05/2006  . Depression   . DIABETES MELLITUS, TYPE II dx'd 2001  . DVT (deep venous thrombosis) (HCC) 2016   BLE  . GERD (gastroesophageal reflux disease)   . History of blood transfusion 2001   "when I had my hand OR"  . History of gout   . History of  kidney stones   . HYPERCHOLESTEROLEMIA 07/17/2007  . HYPERTENSION 12/05/2006  . Learning disability   . Migraine    "1-2/month" (06/26/2016)  . Myocardial infarction (HCC) ~ 2003  . OSA on CPAP   . PE (pulmonary embolism) 2016  . Stroke [facility]) ~ 2006   denies residual on 06/26/2016  . TIA (transient ischemic attack) ?06/25/2016  . VISUAL ACUITY, DECREASED, RIGHT EYE 02/19/2009    Past Surgical History:  Procedure Laterality Date  . CARDIAC CATHETERIZATION  1990s; 2012  . CATARACT EXTRACTION W/ INTRAOCULAR LENS IMPLANT Right 2017  . CORONARY STENT INTERVENTION N/A 06/27/2016   Procedure: Coronary Stent Intervention;  Surgeon: Marykay Lex, MD;  Location: [facility] INVASIVE CV LAB;  Service: Cardiovascular;  Laterality: N/A;  . EYE SURGERY Right    Cataract  . INGUINAL HERNIA REPAIR Bilateral 1990s  . INTRAVASCULAR PRESSURE WIRE/FFR STUDY N/A 06/27/2016   Procedure: Intravascular Pressure Wire/FFR Study;  Surgeon: Marykay Lex, MD;  Location: [facility] INVASIVE CV LAB;  Service: Cardiovascular;  Laterality: N/A;  . LEFT HEART CATH AND CORONARY ANGIOGRAPHY N/A 06/27/2016   Procedure: Left Heart Cath and Coronary Angiography;  Surgeon: Marykay Lex, MD;  Location: [facility] INVASIVE CV LAB;  Service: Cardiovascular;  Laterality: N/A;  . REATTACHMENT HAND Left ~ 2001   w/carpal tunnel release  . UMBILICAL HERNIA REPAIR  1990s   w/IHR     Medications: Current Outpatient  Prescriptions  Medication Sig Dispense Refill  . acetaminophen (TYLENOL) 500 MG tablet Take 1,000 mg by mouth every 6 (six) hours as needed.    Marland Kitchen. aspirin 81 MG chewable tablet Chew 1 tablet (81 mg total) by mouth daily.    . clopidogrel (PLAVIX) 75 MG tablet TAKE 1 TABLET BY MOUTH DAILY 30 tablet 11  . diazepam (VALIUM) 10 MG tablet Take 10 mg by mouth 3 (three) times daily.    . DULoxetine (CYMBALTA) 30 MG capsule Take 30 mg by mouth daily.      . empagliflozin (JARDIANCE) 10 MG TABS tablet Take 10 mg by mouth daily. 30 tablet 11  .  EPIPEN 2-PAK 0.3 MG/0.3ML SOAJ injection INJECT 0.3 MLS (0.3 MG TOTAL) INTO THE MUSCLE ONCE.  5  . FLUoxetine (PROZAC) 40 MG capsule Take 40 mg by mouth daily.      Marland Kitchen. glucose blood (ONETOUCH VERIO) test strip Use to check blood sugar 1 time per day. DX CODE: E11.9 100 each 2  . losartan-hydrochlorothiazide (HYZAAR) 50-12.5 MG tablet Take 1 tablet by mouth daily. 90 tablet 1  . metFORMIN (GLUCOPHAGE-XR) 500 MG 24 hr tablet TAKE 2 TABLETS BY MOUTH TWICE A DAY 120 tablet 2  . nitroGLYCERIN (NITROSTAT) 0.4 MG SL tablet Place 1 tablet (0.4 mg total) under the tongue every 5 (five) minutes as needed for chest pain. 10 tablet 0  . rivaroxaban (XARELTO) 20 MG TABS tablet TAKE 1 TABLET BY MOUTH EVERY DAY BEFORE SUPPER 30 tablet 5  . simvastatin (ZOCOR) 40 MG tablet TAKE 1 TABLET (40 MG TOTAL) BY MOUTH AT BEDTIME. 30 tablet 10  . sitaGLIPtin (JANUVIA) 100 MG tablet Take 1 tablet (100 mg total) by mouth daily. 90 tablet 1   No current facility-administered medications for this visit.     Allergies: Allergies  Allergen Reactions  . Actos [Pioglitazone] Other (See Comments)    Potential cause of DVT  . Bee Venom Anaphylaxis  . Klonopin [Clonazepam] Anaphylaxis  . Invokana [Canagliflozin] Nausea Only    Nausea/dizzy/bad taste in mouth  . Niacin Other (See Comments)    "makes his crazy"    Social History: The patient  reports that he quit smoking about 32 years ago. His smoking use included Cigarettes. He has a 0.30 pack-year smoking history. He has never used smokeless tobacco. He reports that he drinks alcohol. He reports that he does not use drugs.   Family History: The patient's family history includes COPD in his mother; Lymphoma in his father.   Review of Systems: Please see the history of present illness.   Otherwise, the review of systems is positive for none.   All other systems are reviewed and negative.   Physical Exam: VS:  BP (!) 136/94   Pulse 75   Ht 6' (1.829 m)   Wt 266 lb  12.8 oz (121 kg)   SpO2 97%   BMI 36.18 kg/m  .  BMI Body mass index is 36.18 kg/m.  Wt Readings from Last 3 Encounters:  09/25/16 266 lb 12.8 oz (121 kg)  09/12/16 267 lb (121.1 kg)  07/12/16 272 lb (123.4 kg)   Affect appropriate Healthy:  appears stated age HEENT: normal Neck supple with no adenopathy JVP normal no bruits no thyromegaly Lungs clear with no wheezing and good diaphragmatic motion Heart:  S1/S2 no murmur, no rub, gallop or click PMI normal Abdomen: benighn, BS positve, no tenderness, no AAA no bruit.  No HSM or HJR Distal pulses intact with no  bruits No edema Neuro non-focal Skin warm and dry No muscular weakness  LABORATORY DATA:  EKG:   SR poor R wave progression nonspecific ST/T wave changes 06/28/16   Lab Results  Component Value Date   WBC 7.0 07/12/2016   HGB 12.8 (L) 06/28/2016   HCT 39.6 07/12/2016   PLT 399 (H) 07/12/2016   GLUCOSE 157 (H) 07/12/2016   CHOL 131 06/15/2016   TRIG 212.0 (H) 06/15/2016   HDL 46.60 06/15/2016   LDLDIRECT 63.0 06/15/2016   LDLCALC (H) 06/11/2010    120        Total Cholesterol/HDL:CHD Risk Coronary Heart Disease Risk Table                     Men   Women  1/2 Average Risk   3.4   3.3  Average Risk       5.0   4.4  2 X Average Risk   9.6   7.1  3 X Average Risk  23.4   11.0        Use the calculated Patient Ratio above and the CHD Risk Table to determine the patient's CHD Risk.        ATP III CLASSIFICATION (LDL):  <100     mg/dL   Optimal  960-454  mg/dL   Near or Above                    Optimal  130-159  mg/dL   Borderline  098-119  mg/dL   High  >147     mg/dL   Very High   ALT 24 82/95/6213   AST 19 06/25/2016   NA 137 07/12/2016   K 4.7 07/12/2016   CL 96 07/12/2016   CREATININE 1.09 07/12/2016   BUN 17 07/12/2016   CO2 24 07/12/2016   TSH 1.264 06/25/2016   PSA 0.13 09/12/2016   INR 1.07 06/26/2016   HGBA1C 6.8 09/12/2016   MICROALBUR 6.8 (H) 06/15/2016    BNP (last 3  results)  Recent Labs  06/25/16 1552  BNP 6.9    ProBNP (last 3 results) No results for input(s): PROBNP in the last 8760 hours.   Other Studies Reviewed Today:  Cardiac Cath Conclusion 06/2016     The left ventricular systolic function is normal. The left ventricular ejection fraction is 55-65% by visual estimate.  LV end diastolic pressure is normal.  ________Pertinent Coronary Anatomy Findings__________  Dist LAD-1 lesion, 70 %stenosed. FFR 0.77 (Physiologically Significant)  A STENT SYNERGY DES 2.25X12 drug eluting stent was successfully placed (post-dilated to 2.5 mm)  Post intervention, there is a 0% residual stenosis.    Successful FFR guided PCI of distal LAD focal 70-75% lesion with DES stent.  Otherwise no sniffing CAD noted.  Plan:  Return to nursing unit for ongoing care and TR band removal.  Would continue aspirin plus Brilinta for minimum of 3 months. After that would be okay to stop aspirin if necessary.  Continue risk factor modification by Cardiology Consultation Team  He Would Be Okay Discharge from a Cardiac Standpoint/Post PCI on February 14.    Bryan Lemma, MD     Assessment/Plan:  1. CAD - Admission 07/12/16  for chest pain - s/p cath and subsequent PCI with DES to the distal LAD for focal 70 to 75% lesion significant by FFR - on triple therapy - stop ASA now out 3 months  - he is doing well clinically. EF is normal. Baseline  lab today. Encouraged CV risk factor modification - he has a good understanding of what he needs to be doing.   2. HTN - BP ok on current regimen.   3. HLD - on statin therapy.   4. 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   5. DM - followed by PCP - ok to use  Jardiance - would monitor renal function.

## 2016-09-21 ENCOUNTER — Telehealth (HOSPITAL_COMMUNITY): Payer: Self-pay | Admitting: Endocrinology

## 2016-09-25 ENCOUNTER — Encounter: Payer: Self-pay | Admitting: Cardiovascular Disease

## 2016-09-25 ENCOUNTER — Ambulatory Visit (INDEPENDENT_AMBULATORY_CARE_PROVIDER_SITE_OTHER): Payer: Medicare Other | Admitting: Cardiovascular Disease

## 2016-09-25 VITALS — BP 136/94 | HR 75 | Ht 72.0 in | Wt 266.8 lb

## 2016-09-25 DIAGNOSIS — I259 Chronic ischemic heart disease, unspecified: Secondary | ICD-10-CM

## 2016-09-25 NOTE — Patient Instructions (Signed)
Medication Instructions:  Your physician has recommended you make the following change in your medication:  1-STOP your Aspirin at the end of May.  Labwork: NONE  Testing/Procedures: NONE  Follow-Up: Your physician wants you to follow-up in: 6 months with Dr. Eden EmmsNishan. You will receive a reminder letter in the mail two months in advance. If you don't receive a letter, please call our office to schedule the follow-up appointment.   If you need a refill on your cardiac medications before your next appointment, please call your pharmacy.

## 2016-10-23 ENCOUNTER — Other Ambulatory Visit: Payer: Self-pay | Admitting: *Deleted

## 2016-10-23 MED ORDER — CLOPIDOGREL BISULFATE 75 MG PO TABS: 75.0000 mg | ORAL_TABLET | Freq: Every day | ORAL | 3 refills | Status: DC

## 2016-10-27 ENCOUNTER — Encounter: Payer: Self-pay | Admitting: Endocrinology

## 2016-10-27 DIAGNOSIS — Z1211 Encounter for screening for malignant neoplasm of colon: Secondary | ICD-10-CM | POA: Diagnosis not present

## 2016-10-27 DIAGNOSIS — Z1212 Encounter for screening for malignant neoplasm of rectum: Secondary | ICD-10-CM | POA: Diagnosis not present

## 2016-10-27 HISTORY — DX: Encounter for screening for malignant neoplasm of colon: Z12.11

## 2016-10-27 LAB — COLOGUARD: Cologuard: NEGATIVE

## 2016-11-22 ENCOUNTER — Other Ambulatory Visit: Payer: Self-pay | Admitting: Endocrinology

## 2017-01-12 ENCOUNTER — Other Ambulatory Visit: Payer: Self-pay | Admitting: Endocrinology

## 2017-02-16 ENCOUNTER — Other Ambulatory Visit: Payer: Self-pay | Admitting: Endocrinology

## 2017-03-06 ENCOUNTER — Other Ambulatory Visit: Payer: Self-pay | Admitting: Endocrinology

## 2017-03-14 ENCOUNTER — Telehealth: Payer: Self-pay | Admitting: Endocrinology

## 2017-03-14 ENCOUNTER — Ambulatory Visit (INDEPENDENT_AMBULATORY_CARE_PROVIDER_SITE_OTHER): Payer: Medicare Other | Admitting: Endocrinology

## 2017-03-14 ENCOUNTER — Encounter: Payer: Self-pay | Admitting: Endocrinology

## 2017-03-14 VITALS — BP 152/98 | HR 75 | Wt 265.0 lb

## 2017-03-14 DIAGNOSIS — E119 Type 2 diabetes mellitus without complications: Secondary | ICD-10-CM

## 2017-03-14 DIAGNOSIS — I259 Chronic ischemic heart disease, unspecified: Secondary | ICD-10-CM | POA: Diagnosis not present

## 2017-03-14 LAB — POCT GLYCOSYLATED HEMOGLOBIN (HGB A1C): Hemoglobin A1C: 6.7

## 2017-03-14 MED ORDER — FLUCONAZOLE 150 MG PO TABS: 150.0000 mg | ORAL_TABLET | Freq: Every day | ORAL | 2 refills | Status: DC

## 2017-03-14 NOTE — Telephone Encounter (Signed)
Called patient & he said he would come in.

## 2017-03-14 NOTE — Telephone Encounter (Signed)
Patient wife called and states the patient is having a issue urinating . Patient states this has been going on since Wednesday and it has gotten worse. Patient states he would like to be seen today. His contact number is [phone]. Please advise. Thank you

## 2017-03-14 NOTE — Patient Instructions (Addendum)
I have sent a prescription to your pharmacy, for the symptoms. I hope you feel better soon.  If you don't feel better by next week, please call back.   Please continue the same medications for diabetes.  Due to an interaction, skip the simvastatin while on this. Please come back for a follow-up appointment in 3 months.

## 2017-03-14 NOTE — Progress Notes (Signed)
Subjective:    Patient ID: Xavier [name], male    DOB: 11-11-1965, 51 y.o.   MRN: [id number]  HPI  Pt returns for f/u of diabetes mellitus:  DM type: 2 Dx'ed: 2005 Complications: mild CAD, and polyneuropathy.   Therapy: 3 oral meds DKA: never Severe hypoglycemia: never.   Pancreatitis: never.  Other: he did not tolerate pioglitizone (edema); he has never taken insulin, except in the hospital.  Interval history: no cbg record, but states cbg's are well-controlled. Pt states few weeks of moderate pain at the penis, and assoc cracking of the skin there.  Past Medical History:  Diagnosis Date  . ANEMIA, PERNICIOUS 04/03/2007  . Anxiety   . Arthritis    "left hand" (06/26/2016)  . Chronic lower back pain   . Complication of anesthesia    "w/hand OR; spouse had to go into PACU & nudge me q little bit; just wouldn't wake up"  . CORONARY ARTERY DISEASE 12/05/2006  . Depression   . DIABETES MELLITUS, TYPE II dx'd 2001  . DVT (deep venous thrombosis) (HCC) 2016   BLE  . GERD (gastroesophageal reflux disease)   . History of blood transfusion 2001   "when I had my hand OR"  . History of gout   . History of kidney stones   . HYPERCHOLESTEROLEMIA 07/17/2007  . HYPERTENSION 12/05/2006  . Learning disability   . Migraine    "1-2/month" (06/26/2016)  . Myocardial infarction (HCC) ~ 2003  . OSA on CPAP   . PE (pulmonary embolism) 2016  . Stroke [facility]) ~ 2006   denies residual on 06/26/2016  . TIA (transient ischemic attack) ?06/25/2016  . VISUAL ACUITY, DECREASED, RIGHT EYE 02/19/2009    Past Surgical History:  Procedure Laterality Date  . CARDIAC CATHETERIZATION  1990s; 2012  . CATARACT EXTRACTION W/ INTRAOCULAR LENS IMPLANT Right 2017  . CORONARY STENT INTERVENTION N/A 06/27/2016   Procedure: Coronary Stent Intervention;  Surgeon: Marykay Lex, MD;  Location: [facility] INVASIVE CV LAB;  Service: Cardiovascular;  Laterality: N/A;  . EYE SURGERY Right    Cataract  . INGUINAL HERNIA REPAIR  Bilateral 1990s  . INTRAVASCULAR PRESSURE WIRE/FFR STUDY N/A 06/27/2016   Procedure: Intravascular Pressure Wire/FFR Study;  Surgeon: Marykay Lex, MD;  Location: [facility] INVASIVE CV LAB;  Service: Cardiovascular;  Laterality: N/A;  . LEFT HEART CATH AND CORONARY ANGIOGRAPHY N/A 06/27/2016   Procedure: Left Heart Cath and Coronary Angiography;  Surgeon: Marykay Lex, MD;  Location: [facility] INVASIVE CV LAB;  Service: Cardiovascular;  Laterality: N/A;  . REATTACHMENT HAND Left ~ 2001   w/carpal tunnel release  . UMBILICAL HERNIA REPAIR  1990s   w/IHR    Social History   Social History  . Marital status: Married    Spouse name: N/A  . Number of children: N/A  . Years of education: N/A   Occupational History  . Disabled Disabled   Social History Main Topics  . Smoking status: Former Smoker    Packs/day: 0.10    Years: 3.00    Types: Cigarettes    Quit date: 05/15/1984  . Smokeless tobacco: Never Used  . Alcohol use Yes     Comment: 06/26/2016 "once/year I'll have few drinks on vacation"  . Drug use: No  . Sexual activity: No   Other Topics Concern  . Not on file   Social History Narrative   Disabled due to left wrist injury at work    Current Outpatient Prescriptions on File Prior  to Visit  Medication Sig Dispense Refill  . acetaminophen (TYLENOL) 500 MG tablet Take 1,000 mg by mouth every 6 (six) hours as needed.    Marland Kitchen. aspirin 81 MG chewable tablet Chew 1 tablet (81 mg total) by mouth daily.    . clopidogrel (PLAVIX) 75 MG tablet Take 1 tablet (75 mg total) by mouth daily. 90 tablet 3  . diazepam (VALIUM) 10 MG tablet Take 10 mg by mouth 3 (three) times daily.    . DULoxetine (CYMBALTA) 30 MG capsule Take 30 mg by mouth daily.      . empagliflozin (JARDIANCE) 10 MG TABS tablet Take 10 mg by mouth daily. 30 tablet 11  . EPIPEN 2-PAK 0.3 MG/0.3ML SOAJ injection INJECT 0.3 MLS (0.3 MG TOTAL) INTO THE MUSCLE ONCE.  5  . FLUoxetine (PROZAC) 40 MG capsule Take 40 mg by mouth daily.       Marland Kitchen. glucose blood (ONETOUCH VERIO) test strip Use to check blood sugar 1 time per day. DX CODE: E11.9 100 each 2  . JANUVIA 100 MG tablet TAKE 1 TABLET (100 MG TOTAL) BY MOUTH DAILY. 90 tablet 1  . losartan-hydrochlorothiazide (HYZAAR) 50-12.5 MG tablet TAKE 1 TABLET BY MOUTH DAILY. 30 tablet 5  . metFORMIN (GLUCOPHAGE-XR) 500 MG 24 hr tablet TAKE 2 TABLETS (1,000 MG TOTAL) BY MOUTH 2 (TWO) TIMES DAILY. 360 tablet 0  . nitroGLYCERIN (NITROSTAT) 0.4 MG SL tablet Place 1 tablet (0.4 mg total) under the tongue every 5 (five) minutes as needed for chest pain. 10 tablet 0  . rivaroxaban (XARELTO) 20 MG TABS tablet TAKE 1 TABLET BY MOUTH EVERY DAY BEFORE SUPPER 30 tablet 5  . simvastatin (ZOCOR) 40 MG tablet TAKE 1 TABLET (40 MG TOTAL) BY MOUTH AT BEDTIME. 30 tablet 10   No current facility-administered medications on file prior to visit.     Allergies  Allergen Reactions  . Actos [Pioglitazone] Other (See Comments)    Potential cause of DVT  . Bee Venom Anaphylaxis  . Klonopin [Clonazepam] Anaphylaxis  . Invokana [Canagliflozin] Nausea Only    Nausea/dizzy/bad taste in mouth  . Niacin Other (See Comments)    "makes his crazy"    Family History  Problem Relation Age of Onset  . COPD Mother   . Lymphoma Father     BP (!) 152/98   Pulse 75   Wt 265 lb (120.2 kg)   SpO2 97%   BMI 35.94 kg/m    Review of Systems He denies hypoglycemia and weight change    Objective:   Physical Exam VITAL SIGNS:  See vs page GENERAL: no distress Penis: severe cracking and erythema of the foreskin.    Lab Results  Component Value Date   HGBA1C 6.7 03/14/2017      Assessment & Plan:  Acute balanitis, new.   Type 2 DM: well-controlled.  Dyslipidemia: there is a drug-drug interaction between zocor and diflucan.   Patient Instructions  I have sent a prescription to your pharmacy, for the symptoms. I hope you feel better soon.  If you don't feel better by next week, please call back.     Please continue the same medications for diabetes.  Due to an interaction, skip the simvastatin while on this. Please come back for a follow-up appointment in 3 months.

## 2017-03-14 NOTE — Telephone Encounter (Signed)
2:45 PM today

## 2017-03-22 ENCOUNTER — Other Ambulatory Visit: Payer: Self-pay | Admitting: Endocrinology

## 2017-03-22 DIAGNOSIS — I10 Essential (primary) hypertension: Secondary | ICD-10-CM

## 2017-03-22 DIAGNOSIS — I2510 Atherosclerotic heart disease of native coronary artery without angina pectoris: Secondary | ICD-10-CM

## 2017-03-22 DIAGNOSIS — E119 Type 2 diabetes mellitus without complications: Secondary | ICD-10-CM

## 2017-03-22 DIAGNOSIS — E7800 Pure hypercholesterolemia, unspecified: Secondary | ICD-10-CM

## 2017-03-22 DIAGNOSIS — I2699 Other pulmonary embolism without acute cor pulmonale: Secondary | ICD-10-CM

## 2017-03-22 DIAGNOSIS — Z125 Encounter for screening for malignant neoplasm of prostate: Secondary | ICD-10-CM

## 2017-03-29 ENCOUNTER — Encounter: Payer: Self-pay | Admitting: Acute Care

## 2017-03-29 ENCOUNTER — Ambulatory Visit (INDEPENDENT_AMBULATORY_CARE_PROVIDER_SITE_OTHER): Payer: Medicare Other | Admitting: Acute Care

## 2017-03-29 DIAGNOSIS — G4733 Obstructive sleep apnea (adult) (pediatric): Secondary | ICD-10-CM

## 2017-03-29 DIAGNOSIS — I259 Chronic ischemic heart disease, unspecified: Secondary | ICD-10-CM | POA: Diagnosis not present

## 2017-03-29 NOTE — Progress Notes (Signed)
History of Present Illness Xavier [name] is a 51 y.o. male former smoker ( Quit 79) with history of OSA on CPAP and PE on Xaralto. He was followed by Xavier [name].   03/29/2017 Follow up for CPAP Pt states he is not wearing the CPAP device the way he should. He states he has concerns about bacteria building up in the machine and cannot afford the So Clean Machine. He states the 3 nights he wore it this month he felt better and was not snoring.We reviewed risks of untreated OSA. He states he will try to do better.He has less day time sleepiness when he wears it. He knows to clean his machine once weekly. He denies any issues with the mask or equipment. He denies chest pain, orthopnea, orthopnea or hemoptysis.  Test Results: Download 02/27/2017 through 03/28/2017 AutoSet 5 cm H2O through 15 cm H2O Usage 3 of 30 days or 10% Greater than 4 hours 2 days or 7% Less than 4 hours 1 day or 3% Average usage on days worn 5 hours and 16 minutes Median pressure 7.0 cm H2O Maximum pressure 11.0 cm H2O AHI =  1.4  CBC Latest Ref Rng & Units 07/12/2016 06/28/2016 06/27/2016  WBC 3.4 - 10.8 x10E3/uL 7.0 8.5 9.8  Hemoglobin 13.0 - 17.7 g/dL 04.5 12.8(L) 13.8  Hematocrit 37.5 - 51.0 % 39.6 38.2(L) 40.6  Platelets 150 - 379 x10E3/uL 399(H) 337 347    BMP Latest Ref Rng & Units 07/12/2016 06/28/2016 06/27/2016  Glucose 65 - 99 mg/dL 409(W) 119(J) -  BUN 6 - 24 mg/dL 17 12 -  Creatinine 4.78 - 1.27 mg/dL 2.95 6.21 3.08  BUN/Creat Ratio 9 - 20 16 - -  Sodium 134 - 144 mmol/L 137 134(L) -  Potassium 3.5 - 5.2 mmol/L 4.7 3.5 -  Chloride 96 - 106 mmol/L 96 99(L) -  CO2 18 - 29 mmol/L 24 24 -  Calcium 8.7 - 10.2 mg/dL 9.7 8.9 -    BNP    Component Value Date/Time   BNP 6.9 06/25/2016 1552    ProBNP    Component Value Date/Time   PROBNP 49.4 11/26/2013 0525    PFT   Past medical hx Past Medical History:  Diagnosis Date  . ANEMIA, PERNICIOUS 04/03/2007  . Anxiety   . Arthritis    "left  hand" (06/26/2016)  . Chronic lower back pain   . Complication of anesthesia    "w/hand OR; spouse had to go into PACU & nudge me q little bit; just wouldn't wake up"  . CORONARY ARTERY DISEASE 12/05/2006  . Depression   . DIABETES MELLITUS, TYPE II dx'd 2001  . DVT (deep venous thrombosis) (HCC) 2016   BLE  . GERD (gastroesophageal reflux disease)   . History of blood transfusion 2001   "when I had my hand OR"  . History of gout   . History of kidney stones   . HYPERCHOLESTEROLEMIA 07/17/2007  . HYPERTENSION 12/05/2006  . Learning disability   . Migraine    "1-2/month" (06/26/2016)  . Myocardial infarction (HCC) ~ 2003  . OSA on CPAP   . PE (pulmonary embolism) 2016  . Stroke [facility]) ~ 2006   denies residual on 06/26/2016  . TIA (transient ischemic attack) ?06/25/2016  . VISUAL ACUITY, DECREASED, RIGHT EYE 02/19/2009     Social History   Tobacco Use  . Smoking status: Former Smoker    Packs/day: 0.10    Years: 3.00    Pack years:  0.30    Types: Cigarettes    Last attempt to quit: 05/15/1984    Years since quitting: 32.8  . Smokeless tobacco: Never Used  Substance Use Topics  . Alcohol use: Yes    Comment: 06/26/2016 "once/year I'll have few drinks on vacation"  . Drug use: No    Xavier [name] reports that he quit smoking about 32 years ago. His smoking use included cigarettes. He has a 0.30 pack-year smoking history. he has never used smokeless tobacco. He reports that he drinks alcohol. He reports that he does not use drugs.  Tobacco Cessation: Former smoker Quit 1986  Past surgical hx, Family hx, Social hx all reviewed.  Current Outpatient Medications on File Prior to Visit  Medication Sig  . acetaminophen (TYLENOL) 500 MG tablet Take 1,000 mg by mouth every 6 (six) hours as needed.  Marland Kitchen. aspirin 81 MG chewable tablet Chew 1 tablet (81 mg total) by mouth daily.  . clopidogrel (PLAVIX) 75 MG tablet Take 1 tablet (75 mg total) by mouth daily.  . diazepam (VALIUM) 10 MG tablet Take  10 mg by mouth 3 (three) times daily.  . DULoxetine (CYMBALTA) 30 MG capsule Take 30 mg by mouth daily.    . empagliflozin (JARDIANCE) 10 MG TABS tablet Take 10 mg by mouth daily.  Marland Kitchen. EPIPEN 2-PAK 0.3 MG/0.3ML SOAJ injection INJECT 0.3 MLS (0.3 MG TOTAL) INTO THE MUSCLE ONCE.  . fluconazole (DIFLUCAN) 150 MG tablet Take 1 tablet (150 mg total) by mouth daily.  Marland Kitchen. FLUoxetine (PROZAC) 40 MG capsule Take 40 mg by mouth daily.    Marland Kitchen. glucose blood (ONETOUCH VERIO) test strip Use to check blood sugar 1 time per day. DX CODE: E11.9  . JANUVIA 100 MG tablet TAKE 1 TABLET (100 MG TOTAL) BY MOUTH DAILY.  Marland Kitchen. losartan-hydrochlorothiazide (HYZAAR) 50-12.5 MG tablet TAKE 1 TABLET BY MOUTH DAILY.  . metFORMIN (GLUCOPHAGE-XR) 500 MG 24 hr tablet TAKE 2 TABLETS (1,000 MG TOTAL) BY MOUTH 2 (TWO) TIMES DAILY.  . nitroGLYCERIN (NITROSTAT) 0.4 MG SL tablet Place 1 tablet (0.4 mg total) under the tongue every 5 (five) minutes as needed for chest pain.  . rivaroxaban (XARELTO) 20 MG TABS tablet TAKE 1 TABLET BY MOUTH EVERY DAY BEFORE SUPPER  . simvastatin (ZOCOR) 40 MG tablet TAKE 1 TABLET (40 MG TOTAL) BY MOUTH AT BEDTIME.   No current facility-administered medications on file prior to visit.      Allergies  Allergen Reactions  . Actos [Pioglitazone] Other (See Comments)    Potential cause of DVT  . Bee Venom Anaphylaxis  . Klonopin [Clonazepam] Anaphylaxis  . Invokana [Canagliflozin] Nausea Only    Nausea/dizzy/bad taste in mouth  . Niacin Other (See Comments)    "makes his crazy"    Review Of Systems:  Constitutional:   No  weight loss, night sweats,  Fevers, chills, fatigue, or  lassitude.  HEENT:   No headaches,  Difficulty swallowing,  Tooth/dental problems, or  Sore throat,                No sneezing, itching, ear ache, nasal congestion, post nasal drip,   CV:  No chest pain,  Orthopnea, PND, swelling in lower extremities, anasarca, dizziness, palpitations, syncope.   GI  No heartburn, indigestion,  abdominal pain, nausea, vomiting, diarrhea, change in bowel habits, loss of appetite, bloody stools.   Resp: No shortness of breath with exertion or at rest.  No excess mucus, no productive cough,  No non-productive cough,  No coughing  up of blood.  No change in color of mucus.  No wheezing.  No chest wall deformity  Skin: no rash or lesions.  GU: no dysuria, change in color of urine, no urgency or frequency.  No flank pain, no hematuria   MS:  No joint pain or swelling.  No decreased range of motion.  No back pain.  Psych:  No change in mood or affect. No depression or anxiety.  No memory loss.   Vital Signs BP 130/90 (BP Location: Left Arm, Cuff Size: Normal)   Pulse 75   Ht 6' (1.829 Xavier)   Wt 260 lb 6.4 oz (118.1 kg)   SpO2 97%   BMI 35.32 kg/Xavier    Physical Exam:  General- No distress,  A&Ox3, pleasant ENT: No sinus tenderness, TM clear, pale nasal mucosa, no oral exudate,no post nasal drip, no LAN Cardiac: S1, S2, regular rate and rhythm, no murmur Chest: No wheeze/ rales/ dullness; no accessory muscle use, no nasal flaring, no sternal retractions Abd.: Soft Non-tender, non-distended, obese Ext: No clubbing cyanosis, edema Neuro:  normal strength, awake, alert and appropriate, cranial nerves intact Skin: No rashes, warm and dry Psych: normal mood and behavior   Assessment/Plan  OSA (obstructive sleep apnea) Non-compliant with CPAP nightly When he wears CPAP AHI is 1.4 Plan: Reviewed risks of untreated OSA with patient Patient has significant cardiac history and understands OSA negatively impacts his heart He verbalized understanding of the necessity to treat OSA You need to wear your CPAP more. Continue on CPAP at bedtime. You appear to be benefiting from the treatmentwhen you wear it. Goal is to wear for at least 6 hours each night for maximal clinical benefit. Continue to work on weight loss, as the link between excess weight  and sleep apnea is well established.   Do not drive if sleepy. Remember to clean mask, tubing, filter, and reservoir once weekly with soapy water.  Follow up with Dr. Craige Cotta  In 6 months to ensure compliance Continue your xaralto as you have been doing. Bleeding precautions. Follow up with Dr. Eden Emms 04/2017 as is scheduled. Please contact office for sooner follow up if symptoms do not improve or worsen or seek emergency care  Have a great Holiday.     Bevelyn Ngo, NP 03/29/2017  11:15 AM

## 2017-03-29 NOTE — Patient Instructions (Addendum)
It is good to see you today. You need to wear your CPAP more. Continue on CPAP at bedtime. You appear to be benefiting from the treatmentwhen you wear it. Goal is to wear for at least 6 hours each night for maximal clinical benefit. Continue to work on weight loss, as the link between excess weight  and sleep apnea is well established.  Do not drive if sleepy. Remember to clean mask, tubing, filter, and reservoir once weekly with soapy water.  Follow up with Dr. Craige CottaSood  In 6 months. Continue your xaralto as you have been doing. Bleeding precautions. Follow up with Dr. Eden EmmsNishan 04/2017 as is scheduled. Please contact office for sooner follow up if symptoms do not improve or worsen or seek emergency care  Have a great Holiday.

## 2017-03-29 NOTE — Progress Notes (Signed)
I have reviewed and agree with assessment/plan.  Coralyn HellingVineet Sood, MD [facility] 03/29/2017, 11:32 AM Pager:  [phone]

## 2017-03-29 NOTE — Assessment & Plan Note (Signed)
Non-compliant with CPAP nightly When he wears CPAP AHI is 1.4 Plan: Reviewed risks of untreated OSA with patient Patient has significant cardiac history and understands OSA negatively impacts his heart He verbalized understanding of the necessity to treat OSA You need to wear your CPAP more. Continue on CPAP at bedtime. You appear to be benefiting from the treatmentwhen you wear it. Goal is to wear for at least 6 hours each night for maximal clinical benefit. Continue to work on weight loss, as the link between excess weight  and sleep apnea is well established.  Do not drive if sleepy. Remember to clean mask, tubing, filter, and reservoir once weekly with soapy water.  Follow up with Dr. Craige CottaSood  In 6 months to ensure compliance Continue your xaralto as you have been doing. Bleeding precautions. Follow up with Dr. Eden EmmsNishan 04/2017 as is scheduled. Please contact office for sooner follow up if symptoms do not improve or worsen or seek emergency care  Have a great Holiday.

## 2017-04-03 ENCOUNTER — Telehealth: Payer: Self-pay | Admitting: Endocrinology

## 2017-04-03 NOTE — Telephone Encounter (Signed)
Still having discomfort from the yeast infection please advise

## 2017-04-04 MED ORDER — FLUCONAZOLE 150 MG PO TABS: 150.0000 mg | ORAL_TABLET | Freq: Every day | ORAL | 2 refills | Status: DC

## 2017-04-04 NOTE — Telephone Encounter (Signed)
Patient called back & stated that yeast infection got better then came back again. I stated that you had sent prescription into pharmacy for him today.

## 2017-04-04 NOTE — Telephone Encounter (Signed)
Is it improved, just not resolved?  I have sent a prescription to your pharmacy, if this is the case.  If not, please let us know.

## 2017-04-04 NOTE — Telephone Encounter (Signed)
I called and left patient a VM that prescription was sent to his pharmacy. I asked if he would call back to update us on yeast infection & if there was any improvement.

## 2017-04-04 NOTE — Addendum Note (Signed)
Addended by: Romero BellingELLISON, SEAN on: 04/04/2017 09:17 AM   Modules accepted: Orders

## 2017-04-16 NOTE — Progress Notes (Signed)
CARDIOLOGY OFFICE NOTE  Date:  04/17/2017    Xavier AoBobby E Gossett Date of Birth: [birth year] Medical Record #[id number]  PCP:  Romero BellingEllison, Sean, MD  Cardiologist:  Eden EmmsNishan   No chief complaint on file.   History of Present Illness: Xavier [name] is a 51 y.o. male who presents today for f/u of CAD   He has a medical history significant of pernicious anemia, cataract of the right eye, coronary artery disease (treated medically), history of prior MI, hyperlipidemia, hypertension, DVT and prior history of pulmonary embolism - on Xarelto, chronic back pain, sleep apnea on CPAP. FH + for DVT with his mother and sister. Has elected to stay on chronic DOAC therapy per pulmonary note.   Presented February 2018  with complaints of progressively worse dyspnea on exertion and decreased exercise capacity, to the point in the walking between rooms at home triggers symptoms, for one month. The patient also states he has been having chest pressure, nonradiating, associated with mild dizziness, diaphoresis, palpitations and panic attacks which usually gets better with rest. Subsequently admitted. With diagnostic cath  Intervention  DES to the distal LAD for a focal 70 to 75% lesion. He is on triple therapy - aspirin/Plavix/Xarelto - to stop aspirin after 3 months. 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 Likes to do [place] and likes his "jacked up" trucks Two boys one races cares [place] and / [place]  Past Medical History:  Diagnosis Date  . ANEMIA, PERNICIOUS 04/03/2007  . Anxiety   . Arthritis    "left hand" (06/26/2016)  . Chronic lower back pain   . Complication of anesthesia    "w/hand OR; spouse had to go into PACU & nudge me q little bit; just wouldn't wake up"  . CORONARY ARTERY DISEASE 12/05/2006  . Depression   . DIABETES MELLITUS, TYPE II dx'd 2001  .  DVT (deep venous thrombosis) (HCC) 2016   BLE  . GERD (gastroesophageal reflux disease)   . History of blood transfusion 2001   "when I had my hand OR"  . History of gout   . History of kidney stones   . HYPERCHOLESTEROLEMIA 07/17/2007  . HYPERTENSION 12/05/2006  . Learning disability   . Migraine    "1-2/month" (06/26/2016)  . Myocardial infarction (HCC) ~ 2003  . OSA on CPAP   . PE (pulmonary embolism) 2016  . Stroke [facility](HCC) ~ 2006   denies residual on 06/26/2016  . TIA (transient ischemic attack) ?06/25/2016  . VISUAL ACUITY, DECREASED, RIGHT EYE 02/19/2009    Past Surgical History:  Procedure Laterality Date  . CARDIAC CATHETERIZATION  1990s; 2012  . CATARACT EXTRACTION W/ INTRAOCULAR LENS IMPLANT Right 2017  . CORONARY STENT INTERVENTION N/A 06/27/2016   Procedure: Coronary Stent Intervention;  Surgeon: Marykay Lexavid W Harding, MD;  [facility];  Service: Cardiovascular;  Laterality: N/A;  . EYE SURGERY Right    Cataract  . INGUINAL HERNIA REPAIR Bilateral 1990s  . INTRAVASCULAR PRESSURE WIRE/FFR STUDY N/A 06/27/2016   Procedure: Intravascular Pressure Wire/FFR Study;  Surgeon: Marykay Lexavid W Harding, MD;  [facility];  Service: Cardiovascular;  Laterality: N/A;  . LEFT HEART CATH AND CORONARY ANGIOGRAPHY N/A 06/27/2016   Procedure: Left Heart Cath and Coronary Angiography;  Surgeon: Marykay Lexavid W Harding, MD;  [facility];  Service: Cardiovascular;  Laterality: N/A;  . REATTACHMENT HAND Left ~ 2001   w/carpal tunnel release  . UMBILICAL HERNIA REPAIR  1990s   w/IHR     Medications: Current Outpatient Medications  Medication Sig Dispense Refill  . acetaminophen (TYLENOL) 500 MG tablet Take 1,000 mg by mouth every 6 (six) hours as needed.    Marland Kitchen aspirin 81 MG chewable tablet Chew 1 tablet (81 mg total) by mouth daily.    . clopidogrel (PLAVIX) 75 MG tablet Take 1 tablet (75 mg total) by mouth daily. 90 tablet 3  . diazepam (VALIUM) 10 MG tablet Take  10 mg by mouth 3 (three) times daily.    . DULoxetine (CYMBALTA) 30 MG capsule Take 30 mg by mouth daily.      . empagliflozin (JARDIANCE) 10 MG TABS tablet Take 10 mg by mouth daily. 30 tablet 11  . EPIPEN 2-PAK 0.3 MG/0.3ML SOAJ injection INJECT 0.3 MLS (0.3 MG TOTAL) INTO THE MUSCLE ONCE.  5  . fluconazole (DIFLUCAN) 150 MG tablet Take 1 tablet (150 mg total) by mouth daily. 3 tablet 2  . FLUoxetine (PROZAC) 40 MG capsule Take 40 mg by mouth daily.      Marland Kitchen glucose blood (ONETOUCH VERIO) test strip Use to check blood sugar 1 time per day. DX CODE: E11.9 100 each 2  . JANUVIA 100 MG tablet TAKE 1 TABLET (100 MG TOTAL) BY MOUTH DAILY. 90 tablet 1  . losartan-hydrochlorothiazide (HYZAAR) 50-12.5 MG tablet TAKE 1 TABLET BY MOUTH DAILY. 30 tablet 5  . metFORMIN (GLUCOPHAGE-XR) 500 MG 24 hr tablet TAKE 2 TABLETS (1,000 MG TOTAL) BY MOUTH 2 (TWO) TIMES DAILY. 360 tablet 0  . nitroGLYCERIN (NITROSTAT) 0.4 MG SL tablet Place 1 tablet (0.4 mg total) under the tongue every 5 (five) minutes as needed for chest pain. 10 tablet 0  . rivaroxaban (XARELTO) 20 MG TABS tablet TAKE 1 TABLET BY MOUTH EVERY DAY BEFORE SUPPER 30 tablet 5  . simvastatin (ZOCOR) 40 MG tablet TAKE 1 TABLET (40 MG TOTAL) BY MOUTH AT BEDTIME. 30 tablet 4   No current facility-administered medications for this visit.     Allergies: Allergies  Allergen Reactions  . Actos [Pioglitazone] Other (See Comments)    Potential cause of DVT  . Bee Venom Anaphylaxis  . Klonopin [Clonazepam] Anaphylaxis  . Invokana [Canagliflozin] Nausea Only    Nausea/dizzy/bad taste in mouth  . Niacin Other (See Comments)    "makes his crazy"    Social History: The patient  reports that he quit smoking about 32 years ago. His smoking use included cigarettes. He has a 0.30 pack-year smoking history. he has never used smokeless tobacco. He reports that he drinks alcohol. He reports that he does not use drugs.   Family History: The patient's family  history includes COPD in his mother; Lymphoma in his father.   Review of Systems: Please see the history of present illness.   Otherwise, the review of systems is positive for none.   All other systems are reviewed and negative.   Physical Exam: VS:  BP 118/84   Pulse 83   Ht 6' (1.829 m)   Wt 258 lb 8 oz (117.3 kg)   SpO2 97%   BMI 35.06 kg/m  .  BMI Body mass index is 35.06 kg/m.  Wt Readings from Last 3 Encounters:  04/17/17 258 lb 8 oz (117.3 kg)  03/29/17 260 lb 6.4 oz (118.1 kg)  03/14/17 265 lb (120.2  kg)   Affect appropriate Healthy:  appears stated age HEENT: normal Neck supple with no adenopathy JVP normal no bruits no thyromegaly Lungs clear with no wheezing and good diaphragmatic motion Heart:  S1/S2 no murmur, no rub, gallop or click PMI normal Abdomen: benighn, BS positve, no tenderness, no AAA no bruit.  No HSM or HJR Distal pulses intact with no bruits No edema Neuro non-focal Skin warm and dry Surgical scars left hand    LABORATORY DATA:  EKG:   SR poor R wave progression nonspecific ST/T wave changes 06/28/16   Lab Results  Component Value Date   WBC 7.0 07/12/2016   HGB 14.0 07/12/2016   HCT 39.6 07/12/2016   PLT 399 (H) 07/12/2016   GLUCOSE 157 (H) 07/12/2016   CHOL 131 06/15/2016   TRIG 212.0 (H) 06/15/2016   HDL 46.60 06/15/2016   LDLDIRECT 63.0 06/15/2016   LDLCALC (H) 06/11/2010    120        Total Cholesterol/HDL:CHD Risk Coronary Heart Disease Risk Table                     Men   Women  1/2 Average Risk   3.4   3.3  Average Risk       5.0   4.4  2 X Average Risk   9.6   7.1  3 X Average Risk  23.4   11.0        Use the calculated Patient Ratio above and the CHD Risk Table to determine the patient's CHD Risk.        ATP III CLASSIFICATION (LDL):  <100     mg/dL   Optimal  [id number]  mg/dL   Near or Above                    Optimal  130-159  mg/dL   Borderline  [id number]  mg/dL   High  >811>190     mg/dL   Very High   ALT 24  91/47/829502/03/2017   AST 19 06/25/2016   NA 137 07/12/2016   K 4.7 07/12/2016   CL 96 07/12/2016   CREATININE 1.09 07/12/2016   BUN 17 07/12/2016   CO2 24 07/12/2016   TSH 1.264 06/25/2016   PSA 0.13 09/12/2016   INR 1.07 06/26/2016   HGBA1C 6.7 03/14/2017   MICROALBUR 6.8 (H) 06/15/2016    BNP (last 3 results) Recent Labs    06/25/16 1552  BNP 6.9    ProBNP (last 3 results) No results for input(s): PROBNP in the last 8760 hours.   Other Studies Reviewed Today:  Cardiac Cath Conclusion 06/2016     The left ventricular systolic function is normal. The left ventricular ejection fraction is 55-65% by visual estimate.  LV end diastolic pressure is normal.  ________Pertinent Coronary Anatomy Findings__________  Dist LAD-1 lesion, 70 %stenosed. FFR 0.77 (Physiologically Significant)  A STENT SYNERGY DES 2.25X12 drug eluting stent was successfully placed (post-dilated to 2.5 mm)  Post intervention, there is a 0% residual stenosis.    Successful FFR guided PCI of distal LAD focal 70-75% lesion with DES stent.  Otherwise no sniffing CAD noted.  Plan:  Return to nursing unit for ongoing care and TR band removal.  Would continue aspirin plus Brilinta for minimum of 3 months. After that would be okay to stop aspirin if necessary.  Continue risk factor modification by Cardiology Consultation Team  He Would Be Okay Discharge from a Cardiac Standpoint/Post PCI  on February 14.    Bryan Lemma, MD     Assessment/Plan:  1. CAD - Admission 07/12/16  for chest pain - s/p cath and subsequent PCI with DES to the distal LAD for focal 70 to 75% lesion significant by FFR - on triple therapy - stop ASA now out 3 months  - he is doing well clinically. EF is normal. Encouraged CV risk factor modification - he has a good understanding of what he needs to be doing.   2. HTN - Well controlled.  Continue current medications and low sodium Dash type diet.    3. HLD - on statin  labs with primary   4. 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   5. DM - followed by PCP - ok to use  Jardiance - would monitor renal function.

## 2017-04-17 ENCOUNTER — Ambulatory Visit (INDEPENDENT_AMBULATORY_CARE_PROVIDER_SITE_OTHER): Payer: Medicare Other | Admitting: Cardiovascular Disease

## 2017-04-17 ENCOUNTER — Encounter: Payer: Self-pay | Admitting: Cardiovascular Disease

## 2017-04-17 VITALS — BP 118/84 | HR 83 | Ht 72.0 in | Wt 258.5 lb

## 2017-04-17 DIAGNOSIS — Z86718 Personal history of other venous thrombosis and embolism: Secondary | ICD-10-CM | POA: Diagnosis not present

## 2017-04-17 DIAGNOSIS — I259 Chronic ischemic heart disease, unspecified: Secondary | ICD-10-CM | POA: Diagnosis not present

## 2017-04-17 DIAGNOSIS — I10 Essential (primary) hypertension: Secondary | ICD-10-CM | POA: Diagnosis not present

## 2017-04-17 DIAGNOSIS — I2510 Atherosclerotic heart disease of native coronary artery without angina pectoris: Secondary | ICD-10-CM

## 2017-04-17 NOTE — Patient Instructions (Signed)

## 2017-04-18 ENCOUNTER — Other Ambulatory Visit: Payer: Self-pay

## 2017-04-18 DIAGNOSIS — I2699 Other pulmonary embolism without acute cor pulmonale: Secondary | ICD-10-CM

## 2017-04-18 DIAGNOSIS — E7800 Pure hypercholesterolemia, unspecified: Secondary | ICD-10-CM

## 2017-04-18 DIAGNOSIS — E119 Type 2 diabetes mellitus without complications: Secondary | ICD-10-CM

## 2017-04-18 DIAGNOSIS — I10 Essential (primary) hypertension: Secondary | ICD-10-CM

## 2017-04-18 DIAGNOSIS — Z125 Encounter for screening for malignant neoplasm of prostate: Secondary | ICD-10-CM

## 2017-04-18 DIAGNOSIS — I2510 Atherosclerotic heart disease of native coronary artery without angina pectoris: Secondary | ICD-10-CM

## 2017-04-18 MED ORDER — SIMVASTATIN 40 MG PO TABS: 40.0000 mg | ORAL_TABLET | Freq: Every day | ORAL | 4 refills | Status: DC

## 2017-04-19 ENCOUNTER — Other Ambulatory Visit: Payer: Self-pay

## 2017-04-19 DIAGNOSIS — Z125 Encounter for screening for malignant neoplasm of prostate: Secondary | ICD-10-CM

## 2017-04-19 DIAGNOSIS — E7800 Pure hypercholesterolemia, unspecified: Secondary | ICD-10-CM

## 2017-04-19 DIAGNOSIS — I2510 Atherosclerotic heart disease of native coronary artery without angina pectoris: Secondary | ICD-10-CM

## 2017-04-19 DIAGNOSIS — I2699 Other pulmonary embolism without acute cor pulmonale: Secondary | ICD-10-CM

## 2017-04-19 DIAGNOSIS — E119 Type 2 diabetes mellitus without complications: Secondary | ICD-10-CM

## 2017-04-19 DIAGNOSIS — I10 Essential (primary) hypertension: Secondary | ICD-10-CM

## 2017-04-19 MED ORDER — SIMVASTATIN 40 MG PO TABS: 40.0000 mg | ORAL_TABLET | Freq: Every day | ORAL | 2 refills | Status: DC

## 2017-05-09 ENCOUNTER — Telehealth: Payer: Self-pay | Admitting: Pulmonary Disease

## 2017-05-09 DIAGNOSIS — G4733 Obstructive sleep apnea (adult) (pediatric): Secondary | ICD-10-CM

## 2017-05-09 NOTE — Telephone Encounter (Signed)
atc pt, no answer, vm not set up.  Wcb.  

## 2017-05-10 NOTE — Telephone Encounter (Signed)
Spoke with Xavier [name]. She stated that the patient was seen back in November by Maralyn SagoSarah and was advised to keep using his CPAP machine. Patient wants to continue using machine but needs an order for CPAP supplies.   Verified that the patient was still using [facility], he is. Advised her that I could send in an order today for supplies. She verbalized understanding. Nothing else needed at time of call.

## 2017-05-13 ENCOUNTER — Emergency Department (HOSPITAL_BASED_OUTPATIENT_CLINIC_OR_DEPARTMENT_OTHER)
Admission: EM | Admit: 2017-05-13 | Discharge: 2017-05-13 | Disposition: A | Discharge status: Home / Self Care | Payer: Medicare Other | Attending: Emergency Medicine | Admitting: Emergency Medicine

## 2017-05-13 ENCOUNTER — Emergency Department (HOSPITAL_BASED_OUTPATIENT_CLINIC_OR_DEPARTMENT_OTHER): Payer: Medicare Other

## 2017-05-13 ENCOUNTER — Encounter (HOSPITAL_BASED_OUTPATIENT_CLINIC_OR_DEPARTMENT_OTHER): Payer: Self-pay | Admitting: Emergency Medicine

## 2017-05-13 ENCOUNTER — Other Ambulatory Visit: Payer: Self-pay

## 2017-05-13 DIAGNOSIS — J0100 Acute maxillary sinusitis, unspecified: Secondary | ICD-10-CM

## 2017-05-13 DIAGNOSIS — I10 Essential (primary) hypertension: Secondary | ICD-10-CM | POA: Diagnosis not present

## 2017-05-13 DIAGNOSIS — Z7901 Long term (current) use of anticoagulants: Secondary | ICD-10-CM | POA: Diagnosis not present

## 2017-05-13 DIAGNOSIS — I252 Old myocardial infarction: Secondary | ICD-10-CM | POA: Diagnosis not present

## 2017-05-13 DIAGNOSIS — Z79899 Other long term (current) drug therapy: Secondary | ICD-10-CM | POA: Insufficient documentation

## 2017-05-13 DIAGNOSIS — Z7902 Long term (current) use of antithrombotics/antiplatelets: Secondary | ICD-10-CM | POA: Insufficient documentation

## 2017-05-13 DIAGNOSIS — Z7982 Long term (current) use of aspirin: Secondary | ICD-10-CM | POA: Diagnosis not present

## 2017-05-13 DIAGNOSIS — H6501 Acute serous otitis media, right ear: Secondary | ICD-10-CM | POA: Diagnosis not present

## 2017-05-13 DIAGNOSIS — I2510 Atherosclerotic heart disease of native coronary artery without angina pectoris: Secondary | ICD-10-CM | POA: Diagnosis not present

## 2017-05-13 DIAGNOSIS — Z8673 Personal history of transient ischemic attack (TIA), and cerebral infarction without residual deficits: Secondary | ICD-10-CM | POA: Insufficient documentation

## 2017-05-13 DIAGNOSIS — Z87891 Personal history of nicotine dependence: Secondary | ICD-10-CM | POA: Diagnosis not present

## 2017-05-13 DIAGNOSIS — J069 Acute upper respiratory infection, unspecified: Secondary | ICD-10-CM | POA: Diagnosis not present

## 2017-05-13 DIAGNOSIS — E119 Type 2 diabetes mellitus without complications: Secondary | ICD-10-CM | POA: Insufficient documentation

## 2017-05-13 DIAGNOSIS — R0981 Nasal congestion: Secondary | ICD-10-CM | POA: Diagnosis present

## 2017-05-13 DIAGNOSIS — R05 Cough: Secondary | ICD-10-CM | POA: Diagnosis not present

## 2017-05-13 DIAGNOSIS — Z7984 Long term (current) use of oral hypoglycemic drugs: Secondary | ICD-10-CM | POA: Diagnosis not present

## 2017-05-13 MED ORDER — AMOXICILLIN-POT CLAVULANATE 875-125 MG PO TABS: 1.0000 | ORAL_TABLET | Freq: Two times a day (BID) | ORAL | 0 refills | Status: AC

## 2017-05-13 MED ORDER — PROMETHAZINE-DM 6.25-15 MG/5ML PO SYRP: 5.0000 mL | ORAL_SOLUTION | Freq: Four times a day (QID) | ORAL | 0 refills | Status: DC | PRN

## 2017-05-13 NOTE — ED Triage Notes (Signed)
Pt c/o nasal/head congestion x 2 wks; sts coughing up cream colored sputum

## 2017-05-13 NOTE — ED Provider Notes (Signed)
[facility] EMERGENCY DEPARTMENT Provider Note   CSN: [id number] Arrival date & time: 05/13/17  1100     History   Chief Complaint Chief Complaint  Patient presents with  . Nasal Congestion    HPI Xavier [name] is a 51 y.o. 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  He has tried Mucinex and Alka-Seltzer with no relief.  He reports that he improved temporarily and then started getting sick again.  She has known ill contacts with wife and son with similar symptoms.  He has not gotten the flu shot this year.  He reports feeling slightly off balance with the ear fullness and pain today and nauseated.  He has taken Mucinex cold and flu today.  Patient quit smoking more than 15 years ago  HPI  Past Medical History:  Diagnosis Date  . ANEMIA, PERNICIOUS 04/03/2007  . Anxiety   . Arthritis    "left hand" (06/26/2016)  . Chronic lower back pain   . Complication of anesthesia    "w/hand OR; spouse had to go into PACU & nudge me q little bit; just wouldn't wake up"  . CORONARY ARTERY DISEASE 12/05/2006  . Depression   . DIABETES MELLITUS, TYPE II dx'd 2001  . DVT (deep venous thrombosis) (HCC) 2016   BLE  . GERD (gastroesophageal reflux disease)   . History of blood transfusion 2001   "when I had my hand OR"  . History of gout   . History of kidney stones   . HYPERCHOLESTEROLEMIA 07/17/2007  . HYPERTENSION 12/05/2006  . Learning disability   . Migraine    "1-2/month" (06/26/2016)  . Myocardial infarction (HCC) ~ 2003  . OSA on CPAP   . PE (pulmonary embolism) 2016  . Stroke [facility]) ~ 2006   denies residual on 06/26/2016  . TIA (transient ischemic attack) ?06/25/2016  . VISUAL ACUITY, DECREASED, RIGHT EYE 02/19/2009     Patient Active Problem List   Diagnosis Date Noted  . Low back pain 07/11/2016  . Type 2 diabetes mellitus without complication, without long-term current use of insulin (HCC)   . Dyspnea   . Abnormal dobutamine stress echocardiogram   . Chest pain 06/25/2016  . Chronic back pain 06/15/2016  . Screening examination for infectious disease 06/15/2016  . Obesity 03/29/2016  . OSA (obstructive sleep apnea) 09/06/2015  . Hypersomnia with sleep apnea 07/08/2015  . RLS (restless legs syndrome) 07/08/2015  . Cramp of both lower extremities 06/23/2015  . Personal history of DVT (deep vein thrombosis) 06/23/2015  . History of snoring 06/23/2015  . Excessive somnolence disorder 06/23/2015  . Acute pulmonary embolism (HCC) 06/23/2015  . Obesity, morbid (HCC) 12/25/2014  . Cellulitis 12/17/2013  . 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 Depressive disorder 12/03/2013  . Pulmonary embolism (HCC) 11/26/2013  . Screening for prostate cancer 09/24/2012  . Encounter for long-term (current) use of other medications 09/24/2012  . Abnormal breath sounds 02/13/2011  . Visual loss 02/19/2009  . HYPERCHOLESTEROLEMIA 07/17/2007  . ANEMIA, PERNICIOUS 04/03/2007  . Diabetes (HCC) 12/05/2006  . Essential hypertension 12/05/2006  . Coronary atherosclerosis 12/05/2006    Past Surgical History:  Procedure Laterality Date  . CARDIAC CATHETERIZATION  1990s; 2012  . CATARACT EXTRACTION W/ INTRAOCULAR LENS  IMPLANT Right 2017  . CORONARY STENT INTERVENTION N/A 06/27/2016   Procedure: Coronary Stent Intervention;  Surgeon: Marykay Lex, MD;  Location: [facility] INVASIVE CV LAB;  Service: Cardiovascular;  Laterality: N/A;  . EYE SURGERY Right    Cataract  . INGUINAL HERNIA REPAIR Bilateral 1990s  . INTRAVASCULAR PRESSURE WIRE/FFR STUDY N/A 06/27/2016   Procedure: Intravascular Pressure Wire/FFR Study;  Surgeon: Marykay Lex, MD;  Location: [facility] INVASIVE CV LAB;   Service: Cardiovascular;  Laterality: N/A;  . LEFT HEART CATH AND CORONARY ANGIOGRAPHY N/A 06/27/2016   Procedure: Left Heart Cath and Coronary Angiography;  Surgeon: Marykay Lex, MD;  Location: [facility] INVASIVE CV LAB;  Service: Cardiovascular;  Laterality: N/A;  . REATTACHMENT HAND Left ~ 2001   w/carpal tunnel release  . UMBILICAL HERNIA REPAIR  1990s   w/IHR       Home Medications    Prior to Admission medications   Medication Sig Start Date End Date Taking? Authorizing Provider  acetaminophen (TYLENOL) 500 MG tablet Take 1,000 mg by mouth every 6 (six) hours as needed.    [provider]  amoxicillin-clavulanate (AUGMENTIN) 875-125 MG tablet Take 1 tablet by mouth 2 (two) times daily for 10 days. 05/13/17 05/23/17  Georgiana Shore, PA-C  aspirin 81 MG chewable tablet Chew 1 tablet (81 mg total) by mouth daily. 06/28/16   Joseph Art, DO  clopidogrel (PLAVIX) 75 MG tablet Take 1 tablet (75 mg total) by mouth daily. 10/23/16   Wendall Stade, MD  diazepam (VALIUM) 10 MG tablet Take 10 mg by mouth 3 (three) times daily. 05/23/16   [provider]  DULoxetine (CYMBALTA) 30 MG capsule Take 30 mg by mouth daily.      [provider]  empagliflozin (JARDIANCE) 10 MG TABS tablet Take 10 mg by mouth daily. 07/11/16   Romero Belling, MD  EPIPEN 2-PAK 0.3 MG/0.3ML SOAJ injection INJECT 0.3 MLS (0.3 MG TOTAL) INTO THE MUSCLE ONCE. 09/18/16   [provider]  fluconazole (DIFLUCAN) 150 MG tablet Take 1 tablet (150 mg total) by mouth daily. 04/04/17   Romero Belling, MD  FLUoxetine (PROZAC) 40 MG capsule Take 40 mg by mouth daily.      [provider]  glucose blood (ONETOUCH VERIO) test strip Use to check blood sugar 1 time per day. DX CODE: E11.9 08/29/16   Romero Belling, MD  JANUVIA 100 MG tablet TAKE 1 TABLET (100 MG TOTAL) BY MOUTH DAILY. 02/16/17   Romero Belling, MD  losartan-hydrochlorothiazide (HYZAAR) 50-12.5 MG tablet TAKE 1 TABLET BY MOUTH DAILY.  01/12/17   Romero Belling, MD  metFORMIN (GLUCOPHAGE-XR) 500 MG 24 hr tablet TAKE 2 TABLETS (1,000 MG TOTAL) BY MOUTH 2 (TWO) TIMES DAILY. 03/07/17   Romero Belling, MD  nitroGLYCERIN (NITROSTAT) 0.4 MG SL tablet Place 1 tablet (0.4 mg total) under the tongue every 5 (five) minutes as needed for chest pain. 06/28/16   Joseph Art, DO  promethazine-dextromethorphan (PROMETHAZINE-DM) 6.25-15 MG/5ML syrup Take 5 mLs by mouth 4 (four) times daily as needed for cough. 05/13/17   Mathews Robinsons B, PA-C  rivaroxaban (XARELTO) 20 MG TABS tablet TAKE 1 TABLET BY MOUTH EVERY DAY BEFORE SUPPER 08/10/16   de [place], Derby A, MD  simvastatin (ZOCOR) 40 MG tablet Take 1 tablet (40 mg total) by mouth at bedtime. 04/19/17   Romero Belling, MD    Family History Family History  Problem Relation Age of Onset  . COPD Mother   .  Lymphoma Father     Social History Social History   Tobacco Use  . Smoking status: Former Smoker    Packs/day: 0.10    Years: 3.00    Pack years: 0.30    Types: Cigarettes    Last attempt to quit: 05/15/1984    Years since quitting: 33.0  . Smokeless tobacco: Never Used  Substance Use Topics  . Alcohol use: Yes    Comment: 06/26/2016 "once/year I'll have few drinks on vacation"  . Drug use: No     Allergies   Actos [pioglitazone]; Bee venom; Klonopin [clonazepam]; Invokana [canagliflozin]; and Niacin   Review of Systems Review of Systems  Constitutional: Positive for chills and fever.  HENT: Positive for congestion, ear pain, sinus pressure and sinus pain. Negative for drooling, ear discharge, facial swelling, trouble swallowing and voice change.   Eyes: Negative for photophobia, pain, redness and visual disturbance.  Respiratory: Positive for cough. Negative for choking, chest tightness, shortness of breath, wheezing and stridor.   Cardiovascular: Negative for chest pain and palpitations.  Gastrointestinal: Positive for nausea. Negative for abdominal pain and  vomiting.  Genitourinary: Negative for dysuria and hematuria.  Musculoskeletal: Positive for myalgias. Negative for arthralgias, back pain, neck pain and neck stiffness.  Skin: Negative for color change, pallor and rash.  Neurological: Negative for dizziness, seizures, syncope and light-headedness.       Right-sided ear fullness and pressure headache     Physical Exam Updated Vital Signs BP (!) 142/65 (BP Location: Right Arm)   Pulse 88   Temp 98 F (36.7 C) (Oral)   Resp 18   Ht 6' (1.829 m)   Wt 116.1 kg (256 lb)   SpO2 98%   BMI 34.72 kg/m   Physical Exam  Constitutional: He appears well-developed and well-nourished. No distress.  Afebrile, nontoxic-appearing, lying comfortably in bed no acute distress.  HENT:  Head: Normocephalic and atraumatic.  Right Ear: External ear normal.  Left Ear: External ear normal.  Mouth/Throat: No oropharyngeal exudate.  White plaque on the tongue that is removable with tongue depressor.  The left tympanic membrane with small effusion.  Bulging erythematous right tympanic membrane.  Eyes: Conjunctivae and EOM are normal. Right eye exhibits no discharge. Left eye exhibits no discharge.  Neck: Normal range of motion. Neck supple.  Cardiovascular: Normal rate, regular rhythm and normal heart sounds.  No murmur heard. Pulmonary/Chest: Effort normal and breath sounds normal. No stridor. No respiratory distress. He has no wheezes. He has no rales.  Lungs CTA bilaterally.  Mild anterior wheezing initially but cleared after coughing.  Abdominal: He exhibits no distension.  Musculoskeletal: Normal range of motion. He exhibits no edema.  Neurological: He is alert.  Skin: Skin is warm and dry. No rash noted. He is not diaphoretic. No erythema. No pallor.  Psychiatric: He has a normal mood and affect.  Nursing note and vitals reviewed.    ED Treatments / Results  Labs (all labs ordered are listed, but only abnormal results are displayed) Labs  Reviewed - No data to display  EKG  EKG Interpretation None       Radiology Dg Chest 2 View  Result Date: 05/13/2017 CLINICAL DATA:  Productive cough with fever for the past 2 weeks. EXAM: CHEST  2 VIEW COMPARISON:  Chest x-ray dated February 22, 2017. FINDINGS: The heart size and mediastinal contours are within normal limits. Both lungs are clear. The visualized skeletal structures are unremarkable. IMPRESSION: No active cardiopulmonary disease. Electronically Signed  By: Obie DredgeWilliam T Derry M.D.   On: 05/13/2017 12:46    Procedures Procedures (including critical care time)  Medications Ordered in ED Medications - No data to display   Initial Impression / Assessment and Plan / ED Course  I have reviewed the triage vital signs and the nursing notes.  Pertinent labs & imaging results that were available during my care of the patient were reviewed by me and considered in my medical decision making (see chart for details).    Patient presenting with URI acute sinusitis and right sided otitis media.  Chest x-ray negative  Discharge home with symptomatic relief, Augmentin and close follow-up with PCP.  Advised rest and hydration.  Discussed strict return precautions and advised to return to the emergency department if experiencing any new or worsening symptoms. Instructions were understood and patient agreed with discharge plan.  Final Clinical Impressions(s) / ED Diagnoses   Final diagnoses:  Upper respiratory tract infection, unspecified type  Right acute serous otitis media, recurrence not specified  Acute maxillary sinusitis, recurrence not specified    ED Discharge Orders        Ordered    amoxicillin-clavulanate (AUGMENTIN) 875-125 MG tablet  2 times daily     05/13/17 1312    promethazine-dextromethorphan (PROMETHAZINE-DM) 6.25-15 MG/5ML syrup  4 times daily PRN     05/13/17 1312       Gregary CromerMitchell, Jessica B, PA-C 05/13/17 1446    Maia PlanLong, Joshua G, MD 05/14/17  [phone]

## 2017-05-13 NOTE — Discharge Instructions (Signed)
As discussed, please take your entire course of antibiotics even if you feel better.  Drink plenty of fluids and keep yourself well hydrated.  Take cough medications as needed.  Follow-up with your primary care provider.  Return sooner if symptoms worsen or new concerning symptoms in the meantime.

## 2017-05-17 ENCOUNTER — Other Ambulatory Visit: Payer: Self-pay | Admitting: Endocrinology

## 2017-05-23 ENCOUNTER — Other Ambulatory Visit: Payer: Self-pay | Admitting: Acute Care

## 2017-05-23 MED ORDER — RIVAROXABAN 20 MG PO TABS: ORAL_TABLET | ORAL | 5 refills | Status: DC

## 2017-06-03 ENCOUNTER — Other Ambulatory Visit: Payer: Self-pay | Admitting: Endocrinology

## 2017-06-05 ENCOUNTER — Emergency Department (HOSPITAL_BASED_OUTPATIENT_CLINIC_OR_DEPARTMENT_OTHER)
Admission: EM | Admit: 2017-06-05 | Discharge: 2017-06-05 | Disposition: A | Discharge status: Home / Self Care | Payer: Medicare Other | Attending: Emergency Medicine | Admitting: Emergency Medicine

## 2017-06-05 ENCOUNTER — Encounter (HOSPITAL_BASED_OUTPATIENT_CLINIC_OR_DEPARTMENT_OTHER): Payer: Self-pay

## 2017-06-05 ENCOUNTER — Telehealth: Payer: Self-pay | Admitting: Endocrinology

## 2017-06-05 ENCOUNTER — Other Ambulatory Visit: Payer: Self-pay

## 2017-06-05 DIAGNOSIS — H6501 Acute serous otitis media, right ear: Secondary | ICD-10-CM | POA: Diagnosis not present

## 2017-06-05 DIAGNOSIS — I252 Old myocardial infarction: Secondary | ICD-10-CM | POA: Insufficient documentation

## 2017-06-05 DIAGNOSIS — E119 Type 2 diabetes mellitus without complications: Secondary | ICD-10-CM | POA: Diagnosis not present

## 2017-06-05 DIAGNOSIS — Z87891 Personal history of nicotine dependence: Secondary | ICD-10-CM | POA: Insufficient documentation

## 2017-06-05 DIAGNOSIS — I10 Essential (primary) hypertension: Secondary | ICD-10-CM | POA: Insufficient documentation

## 2017-06-05 DIAGNOSIS — H9201 Otalgia, right ear: Secondary | ICD-10-CM | POA: Diagnosis not present

## 2017-06-05 DIAGNOSIS — Z7982 Long term (current) use of aspirin: Secondary | ICD-10-CM | POA: Diagnosis not present

## 2017-06-05 DIAGNOSIS — Z7902 Long term (current) use of antithrombotics/antiplatelets: Secondary | ICD-10-CM | POA: Diagnosis not present

## 2017-06-05 DIAGNOSIS — Z8673 Personal history of transient ischemic attack (TIA), and cerebral infarction without residual deficits: Secondary | ICD-10-CM | POA: Diagnosis not present

## 2017-06-05 DIAGNOSIS — I2510 Atherosclerotic heart disease of native coronary artery without angina pectoris: Secondary | ICD-10-CM | POA: Insufficient documentation

## 2017-06-05 DIAGNOSIS — H6691 Otitis media, unspecified, right ear: Secondary | ICD-10-CM | POA: Diagnosis not present

## 2017-06-05 DIAGNOSIS — H6991 Unspecified Eustachian tube disorder, right ear: Secondary | ICD-10-CM | POA: Diagnosis not present

## 2017-06-05 DIAGNOSIS — H6981 Other specified disorders of Eustachian tube, right ear: Secondary | ICD-10-CM | POA: Insufficient documentation

## 2017-06-05 DIAGNOSIS — Z7901 Long term (current) use of anticoagulants: Secondary | ICD-10-CM | POA: Diagnosis not present

## 2017-06-05 DIAGNOSIS — Z79899 Other long term (current) drug therapy: Secondary | ICD-10-CM | POA: Diagnosis not present

## 2017-06-05 DIAGNOSIS — R360 Urethral discharge without blood: Secondary | ICD-10-CM | POA: Diagnosis present

## 2017-06-05 DIAGNOSIS — B3742 Candidal balanitis: Secondary | ICD-10-CM | POA: Diagnosis not present

## 2017-06-05 MED ORDER — NYSTATIN 100000 UNIT/GM EX CREA: TOPICAL_CREAM | CUTANEOUS | 0 refills | Status: DC

## 2017-06-05 MED ORDER — FLUCONAZOLE 50 MG PO TABS
150.0000 mg | ORAL_TABLET | Freq: Once | ORAL | Status: AC
  Administered 2017-06-05: 150 mg via ORAL
  Filled 2017-06-05: qty 1

## 2017-06-05 MED ORDER — FLUTICASONE PROPIONATE 50 MCG/ACT NA SUSP: NASAL | 1 refills | Status: DC

## 2017-06-05 MED ORDER — FLUCONAZOLE 150 MG PO TABS: 150.0000 mg | ORAL_TABLET | Freq: Every day | ORAL | 0 refills | Status: AC

## 2017-06-05 NOTE — ED Provider Notes (Signed)
[facility] EMERGENCY DEPARTMENT Provider Note   CSN: [id number] Arrival date & time: 06/05/17  1840     History   Chief Complaint Chief Complaint  Patient presents with  . Groin Swelling    HPI Xavier [name] is a 52 y.o. male.  Complaint is "my ear still hurts, and I have another infection on my penis".  HPI this is a 52 year old male.  Non-insulin dependent diabetic.  He was treated with antibiotics for a right ear infection several weeks ago.  He is developed discharge erythema discomfort around his foreskin and glans of his penis.  He states his ear is still somewhat painful and he still feels "like there is water in it".  His hearing is muffled.  Right ear only.  Past Medical History:  Diagnosis Date  . ANEMIA, PERNICIOUS 04/03/2007  . Anxiety   . Arthritis    "left hand" (06/26/2016)  . Chronic lower back pain   . Complication of anesthesia    "w/hand OR; spouse had to go into PACU & nudge me q little bit; just wouldn't wake up"  . CORONARY ARTERY DISEASE 12/05/2006  . Depression   . DIABETES MELLITUS, TYPE II dx'd 2001  . DVT (deep venous thrombosis) (HCC) 2016   BLE  . GERD (gastroesophageal reflux disease)   . History of blood transfusion 2001   "when I had my hand OR"  . History of gout   . History of kidney stones   . HYPERCHOLESTEROLEMIA 07/17/2007  . HYPERTENSION 12/05/2006  . Learning disability   . Migraine    "1-2/month" (06/26/2016)  . Myocardial infarction (HCC) ~ 2003  . OSA on CPAP   . PE (pulmonary embolism) 2016  . Stroke [facility](HCC) ~ 2006   denies residual on 06/26/2016  . TIA (transient ischemic attack) ?06/25/2016  . VISUAL ACUITY, DECREASED, RIGHT EYE 02/19/2009    Patient Active Problem List   Diagnosis Date Noted  . Low back pain 07/11/2016  . Type 2 diabetes mellitus without complication, without long-term current use of insulin (HCC)   . Dyspnea   . Abnormal dobutamine stress echocardiogram   . Chest pain 06/25/2016  . Chronic  back pain 06/15/2016  . Screening examination for infectious disease 06/15/2016  . Obesity 03/29/2016  . OSA (obstructive sleep apnea) 09/06/2015  . Hypersomnia with sleep apnea 07/08/2015  . RLS (restless legs syndrome) 07/08/2015  . Cramp of both lower extremities 06/23/2015  . Personal history of DVT (deep vein thrombosis) 06/23/2015  . History of snoring 06/23/2015  . Excessive somnolence disorder 06/23/2015  . Acute pulmonary embolism (HCC) 06/23/2015  . Obesity, morbid (HCC) 12/25/2014  . Cellulitis 12/17/2013  . 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 Depressive disorder 12/03/2013  . Pulmonary embolism (HCC) 11/26/2013  . Screening for prostate cancer 09/24/2012  . Encounter for long-term (current) use of other medications 09/24/2012  . Abnormal breath sounds 02/13/2011  . Visual loss 02/19/2009  . HYPERCHOLESTEROLEMIA 07/17/2007  . ANEMIA, PERNICIOUS 04/03/2007  . Diabetes (HCC) 12/05/2006  . Essential hypertension 12/05/2006  . Coronary atherosclerosis 12/05/2006    Past Surgical History:  Procedure Laterality Date  . CARDIAC CATHETERIZATION  1990s; 2012  . CATARACT EXTRACTION W/ INTRAOCULAR LENS IMPLANT Right 2017  . CORONARY STENT INTERVENTION N/A 06/27/2016   Procedure: Coronary Stent Intervention;  Surgeon: Marykay Lexavid W Harding, MD;  [facility] LAB;  Service: Cardiovascular;  Laterality: N/A;  . EYE SURGERY Right    Cataract  .  INGUINAL HERNIA REPAIR Bilateral 1990s  . INTRAVASCULAR PRESSURE WIRE/FFR STUDY N/A 06/27/2016   Procedure: Intravascular Pressure Wire/FFR Study;  Surgeon: Marykay Lex, MD;  Location: [facility] INVASIVE CV LAB;  Service: Cardiovascular;  Laterality: N/A;  . LEFT HEART CATH AND CORONARY ANGIOGRAPHY N/A 06/27/2016   Procedure: Left Heart Cath and Coronary Angiography;  Surgeon: Marykay Lex, MD;  Location: [facility] INVASIVE CV LAB;  Service: Cardiovascular;  Laterality: N/A;  . REATTACHMENT HAND  Left ~ 2001   w/carpal tunnel release  . UMBILICAL HERNIA REPAIR  1990s   w/IHR       Home Medications    Prior to Admission medications   Medication Sig Start Date End Date Taking? Authorizing Provider  acetaminophen (TYLENOL) 500 MG tablet Take 1,000 mg by mouth every 6 (six) hours as needed.    [provider]  aspirin 81 MG chewable tablet Chew 1 tablet (81 mg total) by mouth daily. 06/28/16   Joseph Art, DO  clopidogrel (PLAVIX) 75 MG tablet Take 1 tablet (75 mg total) by mouth daily. 10/23/16   Wendall Stade, MD  diazepam (VALIUM) 10 MG tablet Take 10 mg by mouth 3 (three) times daily. 05/23/16   [provider]  DULoxetine (CYMBALTA) 30 MG capsule Take 30 mg by mouth daily.      [provider]  EPIPEN 2-PAK 0.3 MG/0.3ML SOAJ injection INJECT 0.3 MLS (0.3 MG TOTAL) INTO THE MUSCLE ONCE. 09/18/16   [provider]  fluconazole (DIFLUCAN) 150 MG tablet Take 1 tablet (150 mg total) by mouth daily for 7 days. 06/05/17 06/12/17  Rolland Porter, MD  FLUoxetine (PROZAC) 40 MG capsule Take 40 mg by mouth daily.      [provider]  fluticasone Aleda Grana) 50 MCG/ACT nasal spray 1 spray each nares twice daily 06/05/17   Rolland Porter, MD  glucose blood [facility] VERIO) test strip Use to check blood sugar 1 time per day. DX CODE: E11.9 08/29/16   Romero Belling, MD  JANUVIA 100 MG tablet TAKE 1 TABLET (100 MG TOTAL) BY MOUTH DAILY. 02/16/17   Romero Belling, MD  JARDIANCE 10 MG TABS tablet TAKE 1 TABLET BY MOUTH DAILY. 05/17/17   Romero Belling, MD  losartan-hydrochlorothiazide (HYZAAR) 50-12.5 MG tablet TAKE 1 TABLET BY MOUTH DAILY. 01/12/17   Romero Belling, MD  metFORMIN (GLUCOPHAGE-XR) 500 MG 24 hr tablet TAKE 2 TABLETS (1,000 MG TOTAL) BY MOUTH 2 (TWO) TIMES DAILY. 06/03/17   Romero Belling, MD  nitroGLYCERIN (NITROSTAT) 0.4 MG SL tablet Place 1 tablet (0.4 mg total) under the tongue every 5 (five) minutes as needed for chest pain. 06/28/16   Joseph Art, DO    nystatin cream (MYCOSTATIN) Apply to affected area 2 times daily 06/05/17   Rolland Porter, MD  promethazine-dextromethorphan (PROMETHAZINE-DM) 6.25-15 MG/5ML syrup Take 5 mLs by mouth 4 (four) times daily as needed for cough. 05/13/17   Mathews Robinsons B, PA-C  rivaroxaban (XARELTO) 20 MG TABS tablet TAKE 1 TABLET BY MOUTH EVERY DAY BEFORE SUPPER 05/23/17   Nyoka Cowden, MD  simvastatin (ZOCOR) 40 MG tablet Take 1 tablet (40 mg total) by mouth at bedtime. 04/19/17   Romero Belling, MD    Family History Family History  Problem Relation Age of Onset  . COPD Mother   . Lymphoma Father     Social History Social History   Tobacco Use  . Smoking status: Former Smoker    Packs/day: 0.10    Years: 3.00  Pack years: 0.30    Types: Cigarettes    Last attempt to quit: 05/15/1984    Years since quitting: 33.0  . Smokeless tobacco: Never Used  Substance Use Topics  . Alcohol use: No    Frequency: Never  . Drug use: No     Allergies   Actos [pioglitazone]; Bee venom; Klonopin [clonazepam]; Invokana [canagliflozin]; and Niacin   Review of Systems Review of Systems  Constitutional: Negative for appetite change, chills, diaphoresis, fatigue and fever.  HENT: Positive for ear pain and hearing loss. Negative for mouth sores, sore throat and trouble swallowing.   Eyes: Negative for visual disturbance.  Respiratory: Negative for cough, chest tightness, shortness of breath and wheezing.   Cardiovascular: Negative for chest pain.  Gastrointestinal: Negative for abdominal distention, abdominal pain, diarrhea, nausea and vomiting.  Endocrine: Negative for polydipsia, polyphagia and polyuria.  Genitourinary: Negative for dysuria, frequency and hematuria.       With some exudate and erythema around the glans and foreskin.  He is able to retract and replace the foreskin.  No difficulty passing urine, or pain with urinating.  Musculoskeletal: Negative for gait problem.  Skin: Negative for color  change, pallor and rash.  Neurological: Negative for dizziness, syncope, light-headedness and headaches.  Hematological: Does not bruise/bleed easily.  Psychiatric/Behavioral: Negative for behavioral problems and confusion.     Physical Exam Updated Vital Signs BP (!) 136/95 (BP Location: Left Arm)   Pulse 89   Temp 97.7 F (36.5 C) (Oral)   Resp 20   Ht 6' (1.829 m)   Wt 114 kg (251 lb 5.2 oz)   SpO2 98%   BMI 34.09 kg/m   Physical Exam  Constitutional: He is oriented to person, place, and time. He appears well-developed and well-nourished. No distress.  HENT:  Head: Normocephalic.  Right middle ear effusion.  No rupture.  No bullae.  Meniscus noted consistent with serous otitis  Eyes: Conjunctivae are normal. Pupils are equal, round, and reactive to light. No scleral icterus.  Neck: Normal range of motion. Neck supple. No thyromegaly present.  Cardiovascular: Normal rate and regular rhythm. Exam reveals no gallop and no friction rub.  No murmur heard. Pulmonary/Chest: Effort normal and breath sounds normal. No respiratory distress. He has no wheezes. He has no rales.  Abdominal: Soft. Bowel sounds are normal. He exhibits no distension. There is no tenderness. There is no rebound.  Genitourinary:  Genitourinary Comments: Balanitis with white exudate and erythema of the glans.  The foreskin is able to be retracted and replaced, no phimosis or paraphimosis.  Musculoskeletal: Normal range of motion.  Neurological: He is alert and oriented to person, place, and time.  Skin: Skin is warm and dry. No rash noted.  Psychiatric: He has a normal mood and affect. His behavior is normal.     ED Treatments / Results  Labs (all labs ordered are listed, but only abnormal results are displayed) Labs Reviewed - No data to display  EKG  EKG Interpretation None       Radiology No results found.  Procedures Procedures (including critical care time)  Medications Ordered in  ED Medications  fluconazole (DIFLUCAN) tablet 150 mg (150 mg Oral Given 06/05/17 2139)     Initial Impression / Assessment and Plan / ED Course  I have reviewed the triage vital signs and the nursing notes.  Pertinent labs & imaging results that were available during my care of the patient were reviewed by me and considered in my  medical decision making (see chart for details).     Instructed him to retract the foreskin wash with soap and water apply nystatin cream and I reiterated the importance of replacing the foreskin to avoid paraphimosis.  Flonase and Mucinex for his serous otitis.  He is hypertensive and cannot take decongestants.  Final Clinical Impressions(s) / ED Diagnoses   Final diagnoses:  Right acute serous otitis media, recurrence not specified  Dysfunction of right eustachian tube  Candidal balanitis    ED Discharge Orders        Ordered    nystatin cream (MYCOSTATIN)     06/05/17 2138    fluconazole (DIFLUCAN) 150 MG tablet  Daily     06/05/17 2138    fluticasone (FLONASE) 50 MCG/ACT nasal spray     06/05/17 2138       Rolland Porter, MD 06/05/17 2143

## 2017-06-05 NOTE — Telephone Encounter (Signed)
1 PM tomorrow

## 2017-06-05 NOTE — Telephone Encounter (Signed)
Pt states that the infection he has previously been seen has came back. And worse, He needs to know what he needs to do .  He states he went to hospital that he has an ear infection it has been going on 5 weeks now.  He states its like he has water in his ear,    Please advise

## 2017-06-05 NOTE — ED Triage Notes (Signed)
Pt c/o swelling and cracks in skin to scrotum x 3-4 weeks-states he was treated for yeast infection to area x 2 last year-pt NAD-steady gait

## 2017-06-05 NOTE — ED Notes (Signed)
Patient stated that  his penis (crown area)  swells like a golf ball every morning when he wakes up and a white film in the commode when he urinates.  The skin around his penis is also cracked and it hurts when he pulls the skin.  He feels a burning sensation when he urinates and has to bear down to urinate.

## 2017-06-05 NOTE — Discharge Instructions (Signed)
Retract the foreskin, wash with soap and water.  Dry thoroughly.  Apply ointment.  Replace foreskin. Flonase each side of your nose twice per day. Diflucan as prescribed. Follow up with your primary care physician

## 2017-06-06 NOTE — Telephone Encounter (Signed)
Just an FYI: I  called and patient ended up going to the hospital yesterday. He was able to get his mediation then. He is also scheduled for appt 1/31.

## 2017-06-14 ENCOUNTER — Encounter: Payer: Self-pay | Admitting: Endocrinology

## 2017-06-14 ENCOUNTER — Ambulatory Visit (INDEPENDENT_AMBULATORY_CARE_PROVIDER_SITE_OTHER): Payer: Medicare Other | Admitting: Endocrinology

## 2017-06-14 VITALS — BP 120/84 | HR 86 | Wt 257.2 lb

## 2017-06-14 DIAGNOSIS — E119 Type 2 diabetes mellitus without complications: Secondary | ICD-10-CM | POA: Diagnosis not present

## 2017-06-14 LAB — POCT GLYCOSYLATED HEMOGLOBIN (HGB A1C): Hemoglobin A1C: 7.3

## 2017-06-14 MED ORDER — BROMOCRIPTINE MESYLATE 2.5 MG PO TABS: ORAL_TABLET | ORAL | 11 refills | Status: DC

## 2017-06-14 NOTE — Patient Instructions (Addendum)
I have sent a prescription to your pharmacy, to add "bromocriptine."  Please come back for a regular physical appointment in 4 months.

## 2017-06-14 NOTE — Progress Notes (Signed)
Subjective:    Patient ID: Xavier [name], male    DOB: [birth year], 52 y.o.   MRN: [id number]  HPI Pt returns for f/u of diabetes mellitus:  DM type: 2 Dx'ed: 2005 Complications: mild CAD, and polyneuropathy.   Therapy: 3 oral meds DKA: never Severe hypoglycemia: never.   Pancreatitis: never.  Other: he did not tolerate pioglitizone (edema); he has never taken insulin, except in the hospital.  Interval history: no cbg record, but states cbg's are well-controlled.  pt states he feels well in general.   Past Medical History:  Diagnosis Date  . ANEMIA, PERNICIOUS 04/03/2007  . Anxiety   . Arthritis    "left hand" (06/26/2016)  . Chronic lower back pain   . Complication of anesthesia    "w/hand OR; spouse had to go into PACU & nudge me q little bit; just wouldn't wake up"  . CORONARY ARTERY DISEASE 12/05/2006  . Depression   . DIABETES MELLITUS, TYPE II dx'd 2001  . DVT (deep venous thrombosis) (HCC) 2016   BLE  . GERD (gastroesophageal reflux disease)   . History of blood transfusion 2001   "when I had my hand OR"  . History of gout   . History of kidney stones   . HYPERCHOLESTEROLEMIA 07/17/2007  . HYPERTENSION 12/05/2006  . Learning disability   . Migraine    "1-2/month" (06/26/2016)  . Myocardial infarction (HCC) ~ 2003  . OSA on CPAP   . PE (pulmonary embolism) 2016  . Stroke [facility]) ~ 2006   denies residual on 06/26/2016  . TIA (transient ischemic attack) ?06/25/2016  . VISUAL ACUITY, DECREASED, RIGHT EYE 02/19/2009    Past Surgical History:  Procedure Laterality Date  . CARDIAC CATHETERIZATION  1990s; 2012  . CATARACT EXTRACTION W/ INTRAOCULAR LENS IMPLANT Right 2017  . CORONARY STENT INTERVENTION N/A 06/27/2016   Procedure: Coronary Stent Intervention;  Surgeon: Marykay Lex, MD;  Location: [facility] INVASIVE CV LAB;  Service: Cardiovascular;  Laterality: N/A;  . EYE SURGERY Right    Cataract  . INGUINAL HERNIA REPAIR Bilateral 1990s  . INTRAVASCULAR PRESSURE WIRE/FFR  STUDY N/A 06/27/2016   Procedure: Intravascular Pressure Wire/FFR Study;  Surgeon: Marykay Lex, MD;  Location: [facility] INVASIVE CV LAB;  Service: Cardiovascular;  Laterality: N/A;  . LEFT HEART CATH AND CORONARY ANGIOGRAPHY N/A 06/27/2016   Procedure: Left Heart Cath and Coronary Angiography;  Surgeon: Marykay Lex, MD;  Location: [facility] INVASIVE CV LAB;  Service: Cardiovascular;  Laterality: N/A;  . REATTACHMENT HAND Left ~ 2001   w/carpal tunnel release  . UMBILICAL HERNIA REPAIR  1990s   w/IHR    Social History   Socioeconomic History  . Marital status: Married    Spouse name: Not on file  . Number of children: Not on file  . Years of education: Not on file  . Highest education level: Not on file  Social Needs  . Financial resource strain: Not on file  . Food insecurity - worry: Not on file  . Food insecurity - inability: Not on file  . Transportation needs - medical: Not on file  . Transportation needs - non-medical: Not on file  Occupational History  . Occupation: Disabled    Employer: DISABLED  Tobacco Use  . Smoking status: Former Smoker    Packs/day: 0.10    Years: 3.00    Pack years: 0.30    Types: Cigarettes    Last attempt to quit: 05/15/1984    Years since quitting: 33.1  .  Smokeless tobacco: Never Used  Substance and Sexual Activity  . Alcohol use: No    Frequency: Never  . Drug use: No  . Sexual activity: Not on file  Other Topics Concern  . Not on file  Social History Narrative   Disabled due to left wrist injury at work    Current Outpatient Medications on File Prior to Visit  Medication Sig Dispense Refill  . acetaminophen (TYLENOL) 500 MG tablet Take 1,000 mg by mouth every 6 (six) hours as needed.    Marland Kitchen aspirin 81 MG chewable tablet Chew 1 tablet (81 mg total) by mouth daily.    . clopidogrel (PLAVIX) 75 MG tablet Take 1 tablet (75 mg total) by mouth daily. 90 tablet 3  . diazepam (VALIUM) 10 MG tablet Take 10 mg by mouth 3 (three) times daily.    .  DULoxetine (CYMBALTA) 30 MG capsule Take 30 mg by mouth daily.      Marland Kitchen EPIPEN 2-PAK 0.3 MG/0.3ML SOAJ injection INJECT 0.3 MLS (0.3 MG TOTAL) INTO THE MUSCLE ONCE.  5  . FLUoxetine (PROZAC) 40 MG capsule Take 40 mg by mouth daily.      . fluticasone (FLONASE) 50 MCG/ACT nasal spray 1 spray each nares twice daily 10 g 1  . glucose blood (ONETOUCH VERIO) test strip Use to check blood sugar 1 time per day. DX CODE: E11.9 100 each 2  . JANUVIA 100 MG tablet TAKE 1 TABLET (100 MG TOTAL) BY MOUTH DAILY. 90 tablet 1  . JARDIANCE 10 MG TABS tablet TAKE 1 TABLET BY MOUTH DAILY. 30 tablet 3  . losartan-hydrochlorothiazide (HYZAAR) 50-12.5 MG tablet TAKE 1 TABLET BY MOUTH DAILY. 30 tablet 5  . metFORMIN (GLUCOPHAGE-XR) 500 MG 24 hr tablet TAKE 2 TABLETS (1,000 MG TOTAL) BY MOUTH 2 (TWO) TIMES DAILY. 360 tablet 0  . nitroGLYCERIN (NITROSTAT) 0.4 MG SL tablet Place 1 tablet (0.4 mg total) under the tongue every 5 (five) minutes as needed for chest pain. 10 tablet 0  . nystatin cream (MYCOSTATIN) Apply to affected area 2 times daily 60 g 0  . rivaroxaban (XARELTO) 20 MG TABS tablet TAKE 1 TABLET BY MOUTH EVERY DAY BEFORE SUPPER 30 tablet 5  . simvastatin (ZOCOR) 40 MG tablet Take 1 tablet (40 mg total) by mouth at bedtime. 90 tablet 2   No current facility-administered medications on file prior to visit.     Allergies  Allergen Reactions  . Actos [Pioglitazone] Other (See Comments)    Potential cause of DVT  . Bee Venom Anaphylaxis  . Klonopin [Clonazepam] Anaphylaxis  . Invokana [Canagliflozin] Nausea Only    Nausea/dizzy/bad taste in mouth  . Niacin Other (See Comments)    "makes his crazy"    Family History  Problem Relation Age of Onset  . COPD Mother   . Lymphoma Father     BP 120/84 (BP Location: Left Arm, Patient Position: Sitting, Cuff Size: Normal)   Pulse 86   Wt 257 lb 3.2 oz (116.7 kg)   SpO2 96%   BMI 34.88 kg/m    Review of Systems He denies hypoglycemia.     Objective:     Physical Exam VITAL SIGNS:  See vs page GENERAL: no distress Pulses: dorsalis pedis intact bilat.   MSK: no deformity of the feet CV: no leg edema.  Skin:  no ulcer on the feet.  normal color and temp on the feet. Neuro: sensation is intact to touch on the feet Ext: There is bilateral onychomycosis  of the toenails.   Lab Results  Component Value Date   CREATININE 1.09 07/12/2016   BUN 17 07/12/2016   NA 137 07/12/2016   K 4.7 07/12/2016   CL 96 07/12/2016   CO2 24 07/12/2016       Assessment & Plan:  Type 2 DM, with CAD: worse HTN: if we increased jardiance, we would need to adjust hyzaar, but I hesitate to do so.    Patient Instructions  I have sent a prescription to your pharmacy, to add "bromocriptine."  Please come back for a regular physical appointment in 4 months.

## 2017-06-21 ENCOUNTER — Other Ambulatory Visit: Payer: Self-pay | Admitting: Endocrinology

## 2017-07-28 ENCOUNTER — Other Ambulatory Visit: Payer: Self-pay | Admitting: Endocrinology

## 2017-08-20 ENCOUNTER — Other Ambulatory Visit: Payer: Self-pay | Admitting: Endocrinology

## 2017-09-22 ENCOUNTER — Other Ambulatory Visit: Payer: Self-pay | Admitting: Endocrinology

## 2017-09-27 ENCOUNTER — Other Ambulatory Visit: Payer: Self-pay | Admitting: Endocrinology

## 2017-10-10 ENCOUNTER — Encounter: Payer: Self-pay | Admitting: Endocrinology

## 2017-10-10 ENCOUNTER — Ambulatory Visit (INDEPENDENT_AMBULATORY_CARE_PROVIDER_SITE_OTHER): Payer: Medicare Other | Admitting: Endocrinology

## 2017-10-10 VITALS — BP 132/84 | HR 77 | Wt 263.0 lb

## 2017-10-10 DIAGNOSIS — Z125 Encounter for screening for malignant neoplasm of prostate: Secondary | ICD-10-CM

## 2017-10-10 DIAGNOSIS — B3742 Candidal balanitis: Secondary | ICD-10-CM | POA: Diagnosis not present

## 2017-10-10 DIAGNOSIS — E1159 Type 2 diabetes mellitus with other circulatory complications: Secondary | ICD-10-CM | POA: Diagnosis not present

## 2017-10-10 DIAGNOSIS — Z119 Encounter for screening for infectious and parasitic diseases, unspecified: Secondary | ICD-10-CM | POA: Diagnosis not present

## 2017-10-10 DIAGNOSIS — Z0001 Encounter for general adult medical examination with abnormal findings: Secondary | ICD-10-CM | POA: Diagnosis not present

## 2017-10-10 DIAGNOSIS — E119 Type 2 diabetes mellitus without complications: Secondary | ICD-10-CM

## 2017-10-10 LAB — URINALYSIS, ROUTINE W REFLEX MICROSCOPIC
Bilirubin Urine: NEGATIVE
Hgb urine dipstick: NEGATIVE
Leukocytes, UA: NEGATIVE
Nitrite: NEGATIVE
RBC / HPF: NONE SEEN (ref 0–?)
Specific Gravity, Urine: 1.02 (ref 1.000–1.030)
Total Protein, Urine: NEGATIVE
Urine Glucose: >=1000 — AB
Urobilinogen, UA: 0.2 (ref 0.0–1.0)
pH: 5.5 (ref 5.0–8.0)

## 2017-10-10 LAB — CBC WITH DIFFERENTIAL/PLATELET
Basophils Absolute: 0.1 10*3/uL (ref 0.0–0.1)
Basophils Relative: 0.8 % (ref 0.0–3.0)
Eosinophils Absolute: 0.2 10*3/uL (ref 0.0–0.7)
Eosinophils Relative: 1.9 % (ref 0.0–5.0)
HCT: 46.7 % (ref 39.0–52.0)
Hemoglobin: 16.1 g/dL (ref 13.0–17.0)
Lymphocytes Relative: 22.1 % (ref 12.0–46.0)
Lymphs Abs: 2 10*3/uL (ref 0.7–4.0)
MCHC: 34.6 g/dL (ref 30.0–36.0)
MCV: 91.4 fl (ref 78.0–100.0)
Monocytes Absolute: 0.8 10*3/uL (ref 0.1–1.0)
Monocytes Relative: 8.5 % (ref 3.0–12.0)
Neutro Abs: 5.9 10*3/uL (ref 1.4–7.7)
Neutrophils Relative %: 66.7 % (ref 43.0–77.0)
Platelets: 368 10*3/uL (ref 150.0–400.0)
RBC: 5.11 Mil/uL (ref 4.22–5.81)
RDW: 13 % (ref 11.5–15.5)
WBC: 8.9 10*3/uL (ref 4.0–10.5)

## 2017-10-10 LAB — MICROALBUMIN / CREATININE URINE RATIO
Creatinine,U: 79.7 mg/dL
Microalb Creat Ratio: 1.6 mg/g (ref 0.0–30.0)
Microalb, Ur: 1.2 mg/dL (ref 0.0–1.9)

## 2017-10-10 LAB — BASIC METABOLIC PANEL
BUN: 15 mg/dL (ref 6–23)
CO2: 29 mEq/L (ref 19–32)
Calcium: 10.4 mg/dL (ref 8.4–10.5)
Chloride: 95 mEq/L — ABNORMAL LOW (ref 96–112)
Creatinine, Ser: 1.1 mg/dL (ref 0.40–1.50)
GFR: 74.92 mL/min (ref >60.00)
Glucose, Bld: 171 mg/dL — ABNORMAL HIGH (ref 70–99)
Potassium: 4.8 mEq/L (ref 3.5–5.1)
Sodium: 135 mEq/L (ref 135–145)

## 2017-10-10 LAB — LIPID PANEL
Cholesterol: 179 mg/dL (ref 0–200)
HDL: 49 mg/dL (ref >39.00)
Total CHOL/HDL Ratio: 4
Triglycerides: 405 mg/dL — ABNORMAL HIGH (ref 0.0–149.0)

## 2017-10-10 LAB — HEPATIC FUNCTION PANEL
ALT: 36 U/L (ref 0–53)
AST: 23 U/L (ref 0–37)
Albumin: 4.6 g/dL (ref 3.5–5.2)
Alkaline Phosphatase: 70 U/L (ref 39–117)
Bilirubin, Direct: 0.1 mg/dL (ref 0.0–0.3)
Total Bilirubin: 0.8 mg/dL (ref 0.2–1.2)
Total Protein: 7.7 g/dL (ref 6.0–8.3)

## 2017-10-10 LAB — PSA, MEDICARE: PSA: 0.26 ng/ml (ref 0.10–4.00)

## 2017-10-10 LAB — POCT GLYCOSYLATED HEMOGLOBIN (HGB A1C): Hemoglobin A1C: 7.9 % — AB (ref 4.0–5.6)

## 2017-10-10 LAB — TSH: TSH: 2.73 u[IU]/mL (ref 0.35–4.50)

## 2017-10-10 LAB — LDL CHOLESTEROL, DIRECT: Direct LDL: 90 mg/dL

## 2017-10-10 MED ORDER — SEMAGLUTIDE (1 MG/DOSE) 2 MG/1.5ML ~~LOC~~ SOPN
1.0000 mg | PEN_INJECTOR | SUBCUTANEOUS | 3 refills | Status: DC
Start: 2017-10-10 — End: 2017-12-26

## 2017-10-10 MED ORDER — FLUCONAZOLE 100 MG PO TABS
100.0000 mg | ORAL_TABLET | Freq: Every day | ORAL | 5 refills | Status: DC
Start: 2017-10-10 — End: 2018-09-19

## 2017-10-10 MED ORDER — EPIPEN 2-PAK 0.3 MG/0.3ML IJ SOAJ: 0.3000 mg | Freq: Once | INTRAMUSCULAR | 5 refills | Status: AC

## 2017-10-10 MED ORDER — ROSUVASTATIN CALCIUM 5 MG PO TABS: 5.0000 mg | ORAL_TABLET | Freq: Every day | ORAL | 3 refills | Status: DC

## 2017-10-10 NOTE — Patient Instructions (Addendum)
I have sent a prescription to your pharmacy: For the yeast infection.   To change the cholesterol pill to 1 that does not have interactions.  To change januvia to "Ozempic."   good diet and exercise significantly improve the control of your diabetes.  please let me know if you wish to be referred to a dietician.  high blood sugar is very risky to your health.  you should see an eye doctor and dentist every year.  It is very important to get all recommended vaccinations.  blood tests are requested for you today.  We'll let you know about the results.  Please consider these measures for your health:  minimize alcohol.  Do not use tobacco products.  Have a colonoscopy at least every 10 years from age 12.  Women should have an annual mammogram from age 49.  Keep firearms safely stored.  Always use seat belts.  have working smoke alarms in your home.  See an eye doctor and dentist regularly.  Never drive under the influence of alcohol or drugs (including prescription drugs).  Those with fair skin should take precautions against the sun, and should carefully examine their skin once per month, for any new or changed moles. It is critically important to prevent falling down (keep floor areas well-lit, dry, and free of loose objects.  If you have a cane, walker, or wheelchair, you should use it, even for short trips around the house.  Wear flat-soled shoes.  Also, try not to rush)

## 2017-10-10 NOTE — Progress Notes (Signed)
we discussed code status.  pt requests full code, but would not want to be started or maintained on artificial life-support measures if there was not a reasonable chance of recovery 

## 2017-10-10 NOTE — Progress Notes (Signed)
Subjective:    Patient ID: Xavier [name], male    DOB: May 07, 1966, 52 y.o.   MRN: [id number]  HPI Pt returns for f/u of diabetes mellitus:  DM type: 2 Dx'ed: 2005 Complications: mild CAD, and polyneuropathy.   Therapy: 3 oral meds DKA: never Severe hypoglycemia: never.   Pancreatitis: never.  Other: he did not tolerate pioglitizone (edema); he has never taken insulin, except in the hospital.  Interval history: no cbg record, but states cbg's are well-controlled.  pt states he feels well in general.  He has moderate swelling at the penis, and assoc redness. Past Medical History:  Diagnosis Date  . ANEMIA, PERNICIOUS 04/03/2007  . Anxiety   . Arthritis    "left hand" (06/26/2016)  . Chronic lower back pain   . Complication of anesthesia    "w/hand OR; spouse had to go into PACU & nudge me q little bit; just wouldn't wake up"  . CORONARY ARTERY DISEASE 12/05/2006  . Depression   . DIABETES MELLITUS, TYPE II dx'd 2001  . DVT (deep venous thrombosis) (HCC) 2016   BLE  . GERD (gastroesophageal reflux disease)   . History of blood transfusion 2001   "when I had my hand OR"  . History of gout   . History of kidney stones   . HYPERCHOLESTEROLEMIA 07/17/2007  . HYPERTENSION 12/05/2006  . Learning disability   . Migraine    "1-2/month" (06/26/2016)  . Myocardial infarction (HCC) ~ 2003  . OSA on CPAP   . PE (pulmonary embolism) 2016  . Stroke [facility]) ~ 2006   denies residual on 06/26/2016  . TIA (transient ischemic attack) ?06/25/2016  . VISUAL ACUITY, DECREASED, RIGHT EYE 02/19/2009    Past Surgical History:  Procedure Laterality Date  . CARDIAC CATHETERIZATION  1990s; 2012  . CATARACT EXTRACTION W/ INTRAOCULAR LENS IMPLANT Right 2017  . CORONARY STENT INTERVENTION N/A 06/27/2016   Procedure: Coronary Stent Intervention;  Surgeon: Marykay Lex, MD;  Location: [facility] INVASIVE CV LAB;  Service: Cardiovascular;  Laterality: N/A;  . EYE SURGERY Right    Cataract  . INGUINAL HERNIA  REPAIR Bilateral 1990s  . INTRAVASCULAR PRESSURE WIRE/FFR STUDY N/A 06/27/2016   Procedure: Intravascular Pressure Wire/FFR Study;  Surgeon: Marykay Lex, MD;  Location: [facility] INVASIVE CV LAB;  Service: Cardiovascular;  Laterality: N/A;  . LEFT HEART CATH AND CORONARY ANGIOGRAPHY N/A 06/27/2016   Procedure: Left Heart Cath and Coronary Angiography;  Surgeon: Marykay Lex, MD;  Location: [facility] INVASIVE CV LAB;  Service: Cardiovascular;  Laterality: N/A;  . REATTACHMENT HAND Left ~ 2001   w/carpal tunnel release  . UMBILICAL HERNIA REPAIR  1990s   w/IHR    Social History   Socioeconomic History  . Marital status: Married    Spouse name: Not on file  . Number of children: Not on file  . Years of education: Not on file  . Highest education level: Not on file  Occupational History  . Occupation: Disabled    Employer: DISABLED  Social Needs  . Financial resource strain: Not on file  . Food insecurity:    Worry: Not on file    Inability: Not on file  . Transportation needs:    Medical: Not on file    Non-medical: Not on file  Tobacco Use  . Smoking status: Former Smoker    Packs/day: 0.10    Years: 3.00    Pack years: 0.30    Types: Cigarettes    Last attempt to  quit: 05/15/1984    Years since quitting: 33.4  . Smokeless tobacco: Never Used  Substance and Sexual Activity  . Alcohol use: No    Frequency: Never  . Drug use: No  . Sexual activity: Not on file  Lifestyle  . Physical activity:    Days per week: Not on file    Minutes per session: Not on file  . Stress: Not on file  Relationships  . Social connections:    Talks on phone: Not on file    Gets together: Not on file    Attends religious service: Not on file    Active member of club or organization: Not on file    Attends meetings of clubs or organizations: Not on file    Relationship status: Not on file  . Intimate partner violence:    Fear of current or ex partner: Not on file    Emotionally abused: Not on file     Physically abused: Not on file    Forced sexual activity: Not on file  Other Topics Concern  . Not on file  Social History Narrative   Disabled due to left wrist injury at work    Current Outpatient Medications on File Prior to Visit  Medication Sig Dispense Refill  . acetaminophen (TYLENOL) 500 MG tablet Take 1,000 mg by mouth every 6 (six) hours as needed.    Marland Kitchen aspirin 81 MG chewable tablet Chew 1 tablet (81 mg total) by mouth daily.    . bromocriptine (PARLODEL) 2.5 MG tablet 1/4 tab daily 25 tablet 11  . clopidogrel (PLAVIX) 75 MG tablet Take 1 tablet (75 mg total) by mouth daily. 90 tablet 3  . diazepam (VALIUM) 10 MG tablet Take 10 mg by mouth 3 (three) times daily.    . DULoxetine (CYMBALTA) 30 MG capsule Take 30 mg by mouth daily.      Marland Kitchen FLUoxetine (PROZAC) 40 MG capsule Take 40 mg by mouth daily.      . fluticasone (FLONASE) 50 MCG/ACT nasal spray 1 spray each nares twice daily 10 g 1  . JANUVIA 100 MG tablet TAKE 1 TABLET (100 MG TOTAL) BY MOUTH DAILY. 90 tablet 1  . JARDIANCE 10 MG TABS tablet TAKE 1 TABLET BY MOUTH DAILY. 30 tablet 3  . losartan-hydrochlorothiazide (HYZAAR) 50-12.5 MG tablet TAKE 1 TABLET BY MOUTH DAILY. 30 tablet 5  . metFORMIN (GLUCOPHAGE-XR) 500 MG 24 hr tablet TAKE 2 TABLETS (1,000 MG TOTAL) BY MOUTH 2 (TWO) TIMES DAILY. 360 tablet 0  . nitroGLYCERIN (NITROSTAT) 0.4 MG SL tablet Place 1 tablet (0.4 mg total) under the tongue every 5 (five) minutes as needed for chest pain. 10 tablet 0  . nystatin cream (MYCOSTATIN) Apply to affected area 2 times daily 60 g 0  . ONETOUCH VERIO test strip USE TO CHECK BLOOD SUGAR 1 TIME PER DAY. DX CODE: E11.9 100 each 2  . rivaroxaban (XARELTO) 20 MG TABS tablet TAKE 1 TABLET BY MOUTH EVERY DAY BEFORE SUPPER 30 tablet 5   No current facility-administered medications on file prior to visit.     Allergies  Allergen Reactions  . Actos [Pioglitazone] Other (See Comments)    Potential cause of DVT  . Bee Venom  Anaphylaxis  . Klonopin [Clonazepam] Anaphylaxis  . Invokana [Canagliflozin] Nausea Only    Nausea/dizzy/bad taste in mouth  . Niacin Other (See Comments)    "makes his crazy"    Family History  Problem Relation Age of Onset  . COPD Mother   .  Lymphoma Father     BP 132/84   Pulse 77   Wt 263 lb (119.3 kg)   SpO2 95%   BMI 35.67 kg/m   Review of Systems  He has dysuria.  He has gained a few lbs    Objective:   Physical Exam  VITAL SIGNS:  See vs page GENERAL: no distress Penis: erythema of the head and foreskin Pulses: dorsalis pedis intact bilat.   MSK: no deformity of the feet CV: trace bilat leg edema Skin:  no ulcer on the feet.  normal color and temp on the feet. Neuro: sensation is intact to touch on the feet Ext: There is bilateral onychomycosis of the toenails   Lab Results  Component Value Date   HGBA1C 7.9 (A) 10/10/2017      Assessment & Plan:  Type 2 DM, with CAD: he needs increased rx Balanitis: worse Dyslipidemia: there is an interaction between zocor and diflucan  I have sent a prescription to your pharmacy: For the yeast infection.   To change the cholesterol pill to 1 that does not have interactions.  To change januvia to "Ozempic."  Subjective:   Patient here for Medicare annual wellness visit and management of other chronic and acute problems.     Risk factors: advanced age    Roster of Physicians Providing Medical Care to Patient:  See "snapshot"   Activities of Daily Living: In your present state of health, do you have any difficulty performing the following activities (lives with wife)?:  Preparing food and eating?: No  Bathing yourself: No  Getting dressed: No  Using the toilet:No  Moving around from place to place: No  In the past year have you fallen or had a near fall?: No    Home Safety: Has smoke detector and wears seat belts. Firearms are safely stored. No excess sun exposure.   Opioid Use: none.   Diet and Exercise    Current exercise habits: pt says good Dietary issues discussed: pt reports a fairly healthy diet   Depression Screen  Q1: Over the past two weeks, have you felt down, depressed or hopeless? no  Q2: Over the past two weeks, have you felt little interest or pleasure in doing things? no   The following portions of the patient's history were reviewed and updated as appropriate: allergies, current medications, past family history, past medical history, past social history, past surgical history and problem list.   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   Hearing: grossly normal Body mass index:  See vs page Msk: pt easily and quickly performs "get-up-and-go" from a sitting position.   Cognitive Impairment Assessment: cognition, memory and judgment appear normal.  remembers 1/3 at 5 minutes (? effort).  excellent recall.  can easily read and write a sentence.  alert and oriented x 3.    Assessment:   Medicare wellness utd on preventive parameters    Plan:   During the course of the visit the patient was educated and counseled about appropriate screening and preventive services including:        Fall prevention is advised today  Diabetes screening is UTD Nutrition counseling is offered.   advanced directives/end of life addressed today:  see healthcare directives hyperlink  Vaccines are updated as needed  Patient Instructions (the written plan) was given to the patient.

## 2017-11-12 ENCOUNTER — Telehealth: Payer: Self-pay

## 2017-11-12 NOTE — Telephone Encounter (Signed)
I called patient to let him know that we received a fax stating that epipen was super expensive. They asked for a cheaper alternative, but Dr. Everardo AllEllison didn't know one. Patient stated that he didn't even know what the cost was. He stated that he would contact pharmacy because he has to have pen in case of a bee sting.

## 2017-11-26 ENCOUNTER — Encounter: Payer: Self-pay | Admitting: Pulmonary Disease

## 2017-11-26 ENCOUNTER — Ambulatory Visit (INDEPENDENT_AMBULATORY_CARE_PROVIDER_SITE_OTHER): Payer: Medicare Other | Admitting: Pulmonary Disease

## 2017-11-26 VITALS — BP 110/70 | HR 71 | Ht 72.0 in | Wt 254.6 lb

## 2017-11-26 DIAGNOSIS — G4733 Obstructive sleep apnea (adult) (pediatric): Secondary | ICD-10-CM | POA: Diagnosis not present

## 2017-11-26 NOTE — Patient Instructions (Signed)
Can try adjusting setting on CPAP humidifier  If mouth dryness persists, then call or email  Follow up in 1 year

## 2017-11-26 NOTE — Progress Notes (Signed)
[facility]  Chief Complaint  Patient presents with  . Follow-up    Pt has not used cpap machine in last 2 months, still snoring loud and has daytime sleepiness.    Constitutional: BP 110/70 (BP Location: Left Arm, Cuff Size: Normal)   Pulse 71   Ht 6' (1.829 m)   Wt 254 lb 9.6 oz (115.5 kg)   SpO2 98%   BMI 34.53 kg/m   History of Present Illness: Xavier [name] is a 52 y.o. male former smoker with obstructive sleep apnea, and pulmonary embolism.  Xavier [name] hasn't been able to use CPAP for past two months.  Has been getting mouth dryness.  Sleeps better with CPAP and feels more rested.  No issue with mask fit.  Denies sinus congestion or sore throat.  Comprehensive Respiratory Exam:  Appearance - well kempt  ENMT - nasal mucosa moist, turbinates clear, midline nasal septum, poor dentition, no gingival bleeding, no oral exudates, no tonsillar hypertrophy Neck - no masses, trachea midline, no thyromegaly, no elevation in JVP Respiratory - normal appearance of chest wall, normal respiratory effort w/o accessory muscle use, no dullness on percussion, no tactile fremitus, no wheezing or rales CV - s1s2 regular rate and rhythm, no murmurs, no peripheral edema, no varicosities, radial pulses symmetric GI - soft, non tender, no masses, no hepatosplenomegaly Lymph - no adenopathy noted in neck and axillary areas MSK - normal muscle strength and tone, normal gait Ext - no cyanosis, clubbing, or joint inflammation noted Skin - ulcer on left thumb, no drainage or tenderness Neuro - oriented to person, place, and time Psych - normal mood and affect   Assessment/Plan:  Obstructive sleep apnea. - Xavier [name] will try to resume - discussed adjusting his humidifier setting - discussed options for mask fit  Cardiovascular risk. - had an extensive discussion regarding the adverse health consequences related to untreated sleep disordered breathing - specifically  discussed the risks for hypertension, coronary artery disease, cardiac dysrhythmias, cerebrovascular disease, and diabetes - lifestyle modification discussed  Safe driving practices. - discussed how sleep disruption can increase risk of accidents, particularly when driving - safe driving practices were discussed  History of unprovoked PE. - Xavier [name] gets xarelto filled by cardiology    Patient Instructions  Can try adjusting setting on CPAP humidifier  If mouth dryness persists, then call or email  Follow up in 1 year    Coralyn HellingVineet Sood, MD [facility] 11/26/2017, 9:56 AM  Flow Sheet  Pulmonary tests: CT angio chest 11/25/13 >> PE RLL, fatty liver  Sleep tests: HST 07/19/15 >> 36.4, SaO2 low 80%  Cardiac tests: LHC 06/27/16 >> EF 55 to 65%, stent to LAD  Past Medical History: Xavier [name]  has a past medical history of ANEMIA, PERNICIOUS (04/03/2007), Anxiety, Arthritis, Chronic lower back pain, Complication of anesthesia, CORONARY ARTERY DISEASE (12/05/2006), Depression, DIABETES MELLITUS, TYPE II (dx'd 2001), DVT (deep venous thrombosis) (HCC) (2016), GERD (gastroesophageal reflux disease), History of blood transfusion (2001), History of gout, History of kidney stones, HYPERCHOLESTEROLEMIA (07/17/2007), HYPERTENSION (12/05/2006), Learning disability, Migraine, Myocardial infarction (HCC) (~ 2003), OSA on CPAP, PE (pulmonary embolism) (2016), Stroke (HCC) (~ 2006), TIA (transient ischemic attack) (?06/25/2016), and VISUAL ACUITY, DECREASED, RIGHT EYE (02/19/2009).  Past Surgical History: Xavier [name]  has a past surgical history that includes Eye surgery (Right); Inguinal hernia repair (Bilateral, 1990s); Umbilical hernia repair (1990s); Reattachment hand (Left, ~ 2001); Cataract extraction w/ intraocular lens implant (Right, 2017); Cardiac catheterization (1990s; 2012); LEFT  HEART CATH AND CORONARY ANGIOGRAPHY (N/A, 06/27/2016); INTRAVASCULAR PRESSURE WIRE/FFR STUDY (N/A, 06/27/2016); and CORONARY  STENT INTERVENTION (N/A, 06/27/2016).  Family History: His family history includes COPD in his mother; Lymphoma in his father.  Social History: Xavier [name]  reports that Xavier [name] quit smoking about 33 years ago. His smoking use included cigarettes. Xavier [name] has a 0.30 pack-year smoking history. Xavier [name] has never used smokeless tobacco. Xavier [name] reports that Xavier [name] does not drink alcohol or use drugs.  Medications: Allergies as of 11/26/2017      Reactions   Actos [pioglitazone] Other (See Comments)   Potential cause of DVT   Bee Venom Anaphylaxis   Klonopin [clonazepam] Anaphylaxis   Invokana [canagliflozin] Nausea Only   Nausea/dizzy/bad taste in mouth   Niacin Other (See Comments)   "makes his crazy"      Medication List        Accurate as of 11/26/17  9:56 AM. Always use your most recent med list.          acetaminophen 500 MG tablet Commonly known as:  TYLENOL Take 1,000 mg by mouth every 6 (six) hours as needed.   aspirin 81 MG chewable tablet Chew 1 tablet (81 mg total) by mouth daily.   bromocriptine 2.5 MG tablet Commonly known as:  PARLODEL 1/4 tab daily   clopidogrel 75 MG tablet Commonly known as:  PLAVIX Take 1 tablet (75 mg total) by mouth daily.   diazepam 10 MG tablet Commonly known as:  VALIUM Take 10 mg by mouth 3 (three) times daily.   DULoxetine 30 MG capsule Commonly known as:  CYMBALTA Take 30 mg by mouth daily.   fluconazole 100 MG tablet Commonly known as:  DIFLUCAN Take 1 tablet (100 mg total) by mouth daily.   FLUoxetine 40 MG capsule Commonly known as:  PROZAC Take 40 mg by mouth daily.   fluticasone 50 MCG/ACT nasal spray Commonly known as:  FLONASE 1 spray each nares twice daily   JANUVIA 100 MG tablet Generic drug:  sitaGLIPtin TAKE 1 TABLET (100 MG TOTAL) BY MOUTH DAILY.   JARDIANCE 10 MG Tabs tablet Generic drug:  empagliflozin TAKE 1 TABLET BY MOUTH DAILY.   losartan-hydrochlorothiazide 50-12.5 MG tablet Commonly known as:  HYZAAR TAKE 1 TABLET BY  MOUTH DAILY.   metFORMIN 500 MG 24 hr tablet Commonly known as:  GLUCOPHAGE-XR TAKE 2 TABLETS (1,000 MG TOTAL) BY MOUTH 2 (TWO) TIMES DAILY.   nitroGLYCERIN 0.4 MG SL tablet Commonly known as:  NITROSTAT Place 1 tablet (0.4 mg total) under the tongue every 5 (five) minutes as needed for chest pain.   nystatin cream Commonly known as:  MYCOSTATIN Apply to affected area 2 times daily   ONETOUCH VERIO test strip Generic drug:  glucose blood USE TO CHECK BLOOD SUGAR 1 TIME PER DAY. DX CODE: E11.9   rivaroxaban 20 MG Tabs tablet Commonly known as:  XARELTO TAKE 1 TABLET BY MOUTH EVERY DAY BEFORE SUPPER   rosuvastatin 5 MG tablet Commonly known as:  CRESTOR Take 1 tablet (5 mg total) by mouth daily.   Semaglutide 1 MG/DOSE Sopn Commonly known as:  OZEMPIC Inject 1 mg into the skin once a week.

## 2017-12-13 ENCOUNTER — Other Ambulatory Visit: Payer: Self-pay | Admitting: Endocrinology

## 2017-12-16 ENCOUNTER — Other Ambulatory Visit: Payer: Self-pay | Admitting: Endocrinology

## 2017-12-16 NOTE — Progress Notes (Signed)
CARDIOLOGY OFFICE NOTE  Date:  12/19/2017    Trina AoBobby E Pask Date of Birth: 06-08-1965 Medical Record #[id number]  PCP:  Romero BellingEllison, Sean, MD  Cardiologist:  Eden EmmsNishan   No chief complaint on file.   History of Present Illness:  52 y.o. f/u CAD. CRF;s include HTN, HLD DM-2. 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 Likes to do [place] and likes his "jacked up" trucks Two boys one races cars [place] and / Fransisca [place]auf[place]  [place] Still sad about mom and dad passing Use to live on their property but sister sold it all  Past Medical History:  Diagnosis Date  . ANEMIA, PERNICIOUS 04/03/2007  . Anxiety   . Arthritis    "left hand" (06/26/2016)  . Chronic lower back pain   . Complication of anesthesia    "w/hand OR; spouse had to go into PACU & nudge me q little bit; just wouldn't wake up"  . CORONARY ARTERY DISEASE 12/05/2006  . Depression   . DIABETES MELLITUS, TYPE II dx'd 2001  . DVT (deep venous thrombosis) (HCC) 2016   BLE  . GERD (gastroesophageal reflux disease)   . History of blood transfusion 2001   "when I had my hand OR"  . History of gout   . History of kidney stones   . HYPERCHOLESTEROLEMIA 07/17/2007  . HYPERTENSION 12/05/2006  . Learning disability   . Migraine    "1-2/month" (06/26/2016)  . Myocardial infarction (HCC) ~ 2003  . OSA on CPAP   . PE (pulmonary embolism) 2016  . Stroke [facility](HCC) ~ 2006   denies residual on 06/26/2016  . TIA (transient ischemic attack) ?06/25/2016  . VISUAL ACUITY, DECREASED, RIGHT EYE 02/19/2009    Past Surgical History:  Procedure Laterality Date  . CARDIAC CATHETERIZATION  1990s; 2012  . CATARACT EXTRACTION W/ INTRAOCULAR LENS IMPLANT Right 2017  . CORONARY STENT INTERVENTION N/A  06/27/2016   Procedure: Coronary Stent Intervention;  Surgeon: Marykay Lexavid W Harding, MD;  [facility];  Service: Cardiovascular;  Laterality: N/A;  . EYE SURGERY Right    Cataract  . INGUINAL HERNIA REPAIR Bilateral 1990s  . INTRAVASCULAR PRESSURE WIRE/FFR STUDY N/A 06/27/2016   Procedure: Intravascular Pressure Wire/FFR Study;  Surgeon: Marykay Lexavid W Harding, MD;  [facility];  Service: Cardiovascular;  Laterality: N/A;  . LEFT HEART CATH AND CORONARY ANGIOGRAPHY N/A 06/27/2016   Procedure: Left Heart Cath and Coronary Angiography;  Surgeon: Marykay Lexavid W Harding, MD;  [facility];  Service: Cardiovascular;  Laterality: N/A;  . REATTACHMENT HAND Left ~ 2001   w/carpal tunnel release  . UMBILICAL HERNIA REPAIR  1990s   w/IHR     Medications: Current Outpatient Medications  Medication Sig Dispense Refill  . acetaminophen (TYLENOL) 500 MG tablet Take 1,000 mg by mouth every 6 (six) hours as needed.    Marland Kitchen. aspirin 81 MG chewable tablet Chew 1 tablet (81 mg total) by mouth daily.    . bromocriptine (PARLODEL) 2.5 MG tablet 1/4 tab daily 25 tablet 11  . clopidogrel (PLAVIX) 75 MG tablet Take 1 tablet (75 mg total) by mouth daily. 90 tablet 3  . diazepam (VALIUM) 10 MG tablet  Take 10 mg by mouth 3 (three) times daily.    . DULoxetine (CYMBALTA) 30 MG capsule Take 30 mg by mouth daily.      . fluconazole (DIFLUCAN) 100 MG tablet Take 1 tablet (100 mg total) by mouth daily. 5 tablet 5  . FLUoxetine (PROZAC) 40 MG capsule Take 40 mg by mouth daily.      . fluticasone (FLONASE) 50 MCG/ACT nasal spray 1 spray each nares twice daily 10 g 1  . fluticasone (FLONASE) 50 MCG/ACT nasal spray SPRAY ONE SPRAY IN EACH NOTRIL TWICE A DAY 16 g 1  . hydrochlorothiazide (HYDRODIURIL) 12.5 MG tablet TAKE 1 TABLET BY MOUTH EVERY DAY WITH THE LOSARTAN 90 tablet 1  . JANUVIA 100 MG tablet TAKE 1 TABLET (100 MG TOTAL) BY MOUTH DAILY. 90 tablet 1  . JARDIANCE 10 MG TABS tablet TAKE 1  TABLET BY MOUTH DAILY. 30 tablet 3  . losartan (COZAAR) 50 MG tablet TAKE 1 TABLET BY MOUTH EVERY DAY WITH THE HCTZ 90 tablet 1  . losartan-hydrochlorothiazide (HYZAAR) 50-12.5 MG tablet TAKE 1 TABLET BY MOUTH DAILY. 30 tablet 5  . metFORMIN (GLUCOPHAGE-XR) 500 MG 24 hr tablet TAKE 2 TABLETS (1,000 MG TOTAL) BY MOUTH 2 (TWO) TIMES DAILY. 360 tablet 0  . nitroGLYCERIN (NITROSTAT) 0.4 MG SL tablet Place 1 tablet (0.4 mg total) under the tongue every 5 (five) minutes as needed for chest pain. 10 tablet 0  . nystatin cream (MYCOSTATIN) Apply to affected area 2 times daily 60 g 0  . ONETOUCH VERIO test strip USE TO CHECK BLOOD SUGAR 1 TIME PER DAY. DX CODE: E11.9 100 each 2  . rivaroxaban (XARELTO) 20 MG TABS tablet TAKE 1 TABLET BY MOUTH EVERY DAY BEFORE SUPPER 30 tablet 5  . rosuvastatin (CRESTOR) 5 MG tablet Take 1 tablet (5 mg total) by mouth daily. 90 tablet 3  . Semaglutide (OZEMPIC) 1 MG/DOSE SOPN Inject 1 mg into the skin once a week. 12 pen 3   No current facility-administered medications for this visit.     Allergies: Allergies  Allergen Reactions  . Actos [Pioglitazone] Other (See Comments)    Potential cause of DVT  . Bee Venom Anaphylaxis  . Klonopin [Clonazepam] Anaphylaxis  . Invokana [Canagliflozin] Nausea Only    Nausea/dizzy/bad taste in mouth  . Niacin Other (See Comments)    "makes his crazy"    Social History: The patient  reports that he quit smoking about 33 years ago. His smoking use included cigarettes. He has a 0.30 pack-year smoking history. He has never used smokeless tobacco. He reports that he does not drink alcohol or use drugs.   Family History: The patient's family history includes COPD in his mother; Lymphoma in his father.   Review of Systems: Please see the history of present illness.   Otherwise, the review of systems is positive for none.   All other systems are reviewed and negative.   Physical Exam: VS:  BP 140/88   Pulse 82   Ht 6' (1.829 m)    Wt 253 lb 4 oz (114.9 kg)   SpO2 98%   BMI 34.35 kg/m  .  BMI Body mass index is 34.35 kg/m.  Wt Readings from Last 3 Encounters:  12/19/17 253 lb 4 oz (114.9 kg)  11/26/17 254 lb 9.6 oz (115.5 kg)  10/10/17 263 lb (119.3 kg)   Affect appropriate Healthy:  appears stated age HEENT: normal Neck supple with no adenopathy JVP normal no bruits no thyromegaly Lungs  clear with no wheezing and good diaphragmatic motion Heart:  S1/S2 no murmur, no rub, gallop or click PMI normal Abdomen: benighn, BS positve, no tenderness, no AAA no bruit.  No HSM or HJR Distal pulses intact with no bruits No edema Neuro non-focal Skin warm and dry No muscular weakness Surgical scars left hand with weakness    LABORATORY DATA:  EKG:   SR poor R wave progression nonspecific ST/T wave changes 06/28/16 12/19/17 SR rate 74 nonspecific ST changes stable   Lab Results  Component Value Date   WBC 8.9 10/10/2017   HGB 16.1 10/10/2017   HCT 46.7 10/10/2017   PLT 368.0 10/10/2017   GLUCOSE 171 (H) 10/10/2017   CHOL 179 10/10/2017   TRIG (H) 10/10/2017    405.0 Triglyceride is over 400; calculations on Lipids are invalid.   HDL 49.00 10/10/2017   LDLDIRECT 90.0 10/10/2017   LDLCALC (H) 06/11/2010    120        Total Cholesterol/HDL:CHD Risk Coronary Heart Disease Risk Table                     Men   Women  1/2 Average Risk   3.4   3.3  Average Risk       5.0   4.4  2 X Average Risk   9.6   7.1  3 X Average Risk  23.4   11.0        Use the calculated Patient Ratio above and the CHD Risk Table to determine the patient's CHD Risk.        ATP III CLASSIFICATION (LDL):  <100     mg/dL   Optimal  161-096  mg/dL   Near or Above                    Optimal  130-159  mg/dL   Borderline  045-409  mg/dL   High  >811     mg/dL   Very High   ALT 36 91/47/8295   AST 23 10/10/2017   NA 135 10/10/2017   K 4.8 10/10/2017   CL 95 (L) 10/10/2017   CREATININE 1.10 10/10/2017   BUN 15 10/10/2017   CO2  29 10/10/2017   TSH 2.73 10/10/2017   PSA 0.26 10/10/2017   INR 1.07 06/26/2016   HGBA1C 7.9 (A) 10/10/2017   MICROALBUR 1.2 10/10/2017    BNP (last 3 results) No results for input(s): BNP in the last 8760 hours.  ProBNP (last 3 results) No results for input(s): PROBNP in the last 8760 hours.   Other Studies Reviewed Today:  Cardiac Cath Conclusion 06/2016     The left ventricular systolic function is normal. The left ventricular ejection fraction is 55-65% by visual estimate.  LV end diastolic pressure is normal.  ________Pertinent Coronary Anatomy Findings__________  Dist LAD-1 lesion, 70 %stenosed. FFR 0.77 (Physiologically Significant)  A STENT SYNERGY DES 2.25X12 drug eluting stent was successfully placed (post-dilated to 2.5 mm)  Post intervention, there is a 0% residual stenosis.    Successful FFR guided PCI of distal LAD focal 70-75% lesion with DES stent.  Otherwise no sniffing CAD noted.  Plan:  Return to nursing unit for ongoing care and TR band removal.  Would continue aspirin plus Brilinta for minimum of 3 months. After that would be okay to stop aspirin if necessary.  Continue risk factor modification by Cardiology Consultation Team  He Would Be Okay Discharge from a Cardiac Standpoint/Post PCI on  February 14.    Bryan Lemma, MD     Assessment/Plan:  1. CAD - Admission 07/12/16  for chest pain - s/p cath and subsequent PCI with DES to the distal LAD for focal 70 to 75% lesion significant by FFR - ASA stopped after 3 months due to need for anticoagulation   2. HTN - Well controlled.  Continue current medications and low sodium Dash type diet.    3. HLD - on statin labs with primary   4. 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   5. DM - followed by PCP - ok to use  Jardiance - would monitor renal function.   [name]

## 2017-12-18 ENCOUNTER — Other Ambulatory Visit: Payer: Self-pay | Admitting: Endocrinology

## 2017-12-18 DIAGNOSIS — I2510 Atherosclerotic heart disease of native coronary artery without angina pectoris: Secondary | ICD-10-CM

## 2017-12-18 DIAGNOSIS — E119 Type 2 diabetes mellitus without complications: Secondary | ICD-10-CM

## 2017-12-18 DIAGNOSIS — I10 Essential (primary) hypertension: Secondary | ICD-10-CM

## 2017-12-18 DIAGNOSIS — Z125 Encounter for screening for malignant neoplasm of prostate: Secondary | ICD-10-CM

## 2017-12-18 DIAGNOSIS — E7800 Pure hypercholesterolemia, unspecified: Secondary | ICD-10-CM

## 2017-12-19 ENCOUNTER — Ambulatory Visit (INDEPENDENT_AMBULATORY_CARE_PROVIDER_SITE_OTHER): Payer: Medicare Other | Admitting: Cardiovascular Disease

## 2017-12-19 ENCOUNTER — Encounter: Payer: Self-pay | Admitting: Cardiovascular Disease

## 2017-12-19 VITALS — BP 140/88 | HR 82 | Ht 72.0 in | Wt 253.2 lb

## 2017-12-19 DIAGNOSIS — Z86718 Personal history of other venous thrombosis and embolism: Secondary | ICD-10-CM

## 2017-12-19 DIAGNOSIS — I2510 Atherosclerotic heart disease of native coronary artery without angina pectoris: Secondary | ICD-10-CM | POA: Diagnosis not present

## 2017-12-19 DIAGNOSIS — I10 Essential (primary) hypertension: Secondary | ICD-10-CM | POA: Diagnosis not present

## 2017-12-19 DIAGNOSIS — E785 Hyperlipidemia, unspecified: Secondary | ICD-10-CM | POA: Diagnosis not present

## 2017-12-19 NOTE — Patient Instructions (Addendum)

## 2017-12-26 ENCOUNTER — Other Ambulatory Visit: Payer: Self-pay | Admitting: Endocrinology

## 2017-12-26 ENCOUNTER — Other Ambulatory Visit: Payer: Self-pay | Admitting: Internal Medicine

## 2017-12-26 ENCOUNTER — Other Ambulatory Visit: Payer: Self-pay | Admitting: Cardiovascular Disease

## 2017-12-27 ENCOUNTER — Other Ambulatory Visit: Payer: Self-pay | Admitting: Endocrinology

## 2018-01-04 ENCOUNTER — Other Ambulatory Visit: Payer: Self-pay

## 2018-01-04 ENCOUNTER — Telehealth: Payer: Self-pay | Admitting: Emergency Medicine

## 2018-01-04 NOTE — Telephone Encounter (Signed)
No, the ozempic replaces the [place].  I took the [place] off med list

## 2018-01-04 NOTE — Telephone Encounter (Signed)
Yes, Please continue the same [place] for now.  If trulicty is alternative which does not need PA, that is OK

## 2018-01-04 NOTE — Telephone Encounter (Signed)
I called pharmacy & they stated that they need PA for Ozempic. I asked if this was because it appeared that he was taking Januvia as well as Ozempic, they said no. I called patient back & stated that I would do PA first thing Monday morning & see if I could get it approved, but sometimes it takes up to 72 hours. Can patient just take Januvia because he will be out as of Sunday of the Ozempic? I will also call & tell patient when this is all done.

## 2018-01-04 NOTE — Telephone Encounter (Signed)
Pt wife called and wants to know if patient is suppose to still be taking JANUVIA 100 MG tablet? She is also wondering if he needs to still be taking OZEMPIC 1 MG/DOSE SOPN? If so he is going to needs a PA. Please give her a call back thanks.

## 2018-01-04 NOTE — Telephone Encounter (Signed)
I see both on patient's chart, but please advise on if he should be on both?

## 2018-01-07 NOTE — Telephone Encounter (Signed)
The jardiance is the once causing the yeast infection.  Why don't you do this: get  Refill of the fluconazole, and reduce the jardiance to 1/2 pill per day.  Ok with you?

## 2018-01-07 NOTE — Telephone Encounter (Signed)
Patient called back and stated he is getting a yeast infection from [place] and wants to be switched- should I call in trulicity instead please advise

## 2018-01-07 NOTE — Telephone Encounter (Signed)
LVM with MD advice from previous note and requested a call back if he had any questions

## 2018-01-08 ENCOUNTER — Other Ambulatory Visit: Payer: Self-pay | Admitting: Endocrinology

## 2018-01-08 NOTE — Telephone Encounter (Signed)
[facility] & I am waiting on response. They gave me a 72 hour time window & reference# [id number]. I have called patient's wife & LVM on this update as well as pharmacy.

## 2018-01-10 ENCOUNTER — Encounter: Payer: Self-pay | Admitting: Nurse Practitioner

## 2018-01-10 ENCOUNTER — Ambulatory Visit (INDEPENDENT_AMBULATORY_CARE_PROVIDER_SITE_OTHER): Payer: Medicare Other | Admitting: Nurse Practitioner

## 2018-01-10 ENCOUNTER — Other Ambulatory Visit (INDEPENDENT_AMBULATORY_CARE_PROVIDER_SITE_OTHER): Payer: Medicare Other

## 2018-01-10 VITALS — BP 126/80 | HR 86 | Ht 72.0 in | Wt 253.0 lb

## 2018-01-10 DIAGNOSIS — I10 Essential (primary) hypertension: Secondary | ICD-10-CM | POA: Diagnosis not present

## 2018-01-10 DIAGNOSIS — E538 Deficiency of other specified B group vitamins: Secondary | ICD-10-CM | POA: Diagnosis not present

## 2018-01-10 DIAGNOSIS — E7800 Pure hypercholesterolemia, unspecified: Secondary | ICD-10-CM | POA: Diagnosis not present

## 2018-01-10 DIAGNOSIS — Z23 Encounter for immunization: Secondary | ICD-10-CM

## 2018-01-10 DIAGNOSIS — E119 Type 2 diabetes mellitus without complications: Secondary | ICD-10-CM

## 2018-01-10 DIAGNOSIS — Z91030 Bee allergy status: Secondary | ICD-10-CM | POA: Diagnosis not present

## 2018-01-10 LAB — LIPID PANEL
Cholesterol: 148 mg/dL (ref 0–200)
HDL: 49.6 mg/dL (ref >39.00)
NonHDL: 97.92
Total CHOL/HDL Ratio: 3
Triglycerides: 308 mg/dL — ABNORMAL HIGH (ref 0.0–149.0)
VLDL: 61.6 mg/dL — ABNORMAL HIGH (ref 0.0–40.0)

## 2018-01-10 LAB — VITAMIN B12: Vitamin B-12: 329 pg/mL (ref 211–911)

## 2018-01-10 LAB — LDL CHOLESTEROL, DIRECT: Direct LDL: 72 mg/dL

## 2018-01-10 LAB — HEMOGLOBIN A1C: Hgb A1c MFr Bld: 7 % — ABNORMAL HIGH (ref 4.6–6.5)

## 2018-01-10 MED ORDER — EPINEPHRINE 0.3 MG/0.3ML IJ SOAJ: 0.3000 mg | Freq: Once | INTRAMUSCULAR | 5 refills | Status: AC

## 2018-01-10 NOTE — Patient Instructions (Signed)
Please head downstairs for lab work. If any of your test results are critically abnormal, you will be contacted right away. Otherwise, I will contact you within a week about your test results and follow up recommendations  We will determine a follow up visit when I get your labs back.  It was nice to meet you. Thanks for letting me take care of you today.

## 2018-01-10 NOTE — Assessment & Plan Note (Addendum)
He does not want to return to endocrinology but it does not sound that his diabetes is well controlled at this time so we discussed that we may need to continue endocrinology follow up for management of his diabetes We will update A1c today and follow up with further plan of care pending results - Hemoglobin A1c; Future

## 2018-01-10 NOTE — Assessment & Plan Note (Signed)
Maintained on rosuvastatin 5 daily Lipid panel was checked in May with elevated triglycerides, no new medication was started, he was instructed to work on diet and exercise, will update lipid panel today to determine further plan of care Unsure if he is fasting - Lipid panel; Future

## 2018-01-10 NOTE — Assessment & Plan Note (Signed)
Update labs F/U with further recommendations pending lab results - Vitamin B12; Future 

## 2018-01-10 NOTE — Assessment & Plan Note (Signed)
Stable.  Continue current medications.

## 2018-01-10 NOTE — Progress Notes (Signed)
Name: Xavier [name]   MRN: [id number]    DOB: August 20, 1965   Date:01/10/2018       Progress Note  Subjective  Chief Complaint Establish care  HPI [name] is here today to establish care, transferring from his endocrinologist who was managing his primary care as well as his diabetes prior to now. Aside from primary care, he follows routinely with psychiatry for mood disorder, pulmonology for OSA,  and cardiology for coronary artery disease, essential hypertension, HLD, history of DVT/PE. He has no complaints today, and we will review his chronic conditions managed by his PCP including HLD, HTN, DM. He is also requesting a refill of his EPI pen, used for bee stings, has not needed to use the EPI in quite some time but does have anaphylactic reactions to bee stings, and requesting to update his B12 labs today, as he was on B12 injections in the past but has stopped taking them.  Cholesterol- maintained on rosuvastatin 5 daily  Reports daily medication compliance without adverse medication effects including myalgias.  Lab Results  Component Value Date   CHOL 179 10/10/2017   HDL 49.00 10/10/2017   LDLCALC (H) 06/11/2010    120        Total Cholesterol/HDL:CHD Risk Coronary Heart Disease Risk Table                     Men   Women  1/2 Average Risk   3.4   3.3  Average Risk       5.0   4.4  2 X Average Risk   9.6   7.1  3 X Average Risk  23.4   11.0        Use the calculated Patient Ratio above and the CHD Risk Table to determine the patient's CHD Risk.        ATP III CLASSIFICATION (LDL):  <100     mg/dL   Optimal  [id number]  mg/dL   Near or Above                    Optimal  130-159  mg/dL   Borderline  [id number]  mg/dL   High  >629>190     mg/dL   Very High   LDLDIRECT 90.0 10/10/2017   TRIG (H) 10/10/2017    405.0 Triglyceride is over 400; calculations on Lipids are invalid.   CHOLHDL 4 10/10/2017    Hypertension -maintained on losartan 50, HCTZ 12.5 daily  Reports daily medication  compliance without noted adverse medication effects. Reports he occasionally checks BP readings at home with normal readings No  headaches, vision changes, chest pain, shortness of breath, edema.  BP Readings from Last 3 Encounters:  01/10/18 126/80  12/19/17 140/88  11/26/17 110/70    Diabetes- maintained on ozempic weekly, metformin 1000 BID, jardiance 10 daily and was also recently started on bromocriptine 2.5 1/4 tablet daily, which he thinks is for his diabetes. Reports daily medication compliance but has noted increased yeast infections since starting jardiance, has been treated with diflucan course several times now Reports occasionally checking his blood sugars at home, but not routinely check. Denies  polyuria, polydipsia, polyphagia, but does say he sometimes feels shaky or sweaty and that his blood sugar might be low.  Lab Results  Component Value Date   HGBA1C 7.9 (A) 10/10/2017     Patient Active Problem List   Diagnosis Date Noted  . Low back pain 07/11/2016  . Type 2  diabetes mellitus without complication, without long-term current use of insulin (HCC)   . Dyspnea   . Abnormal dobutamine stress echocardiogram   . Chest pain 06/25/2016  . Chronic back pain 06/15/2016  . Screening examination for infectious disease 06/15/2016  . Obesity 03/29/2016  . OSA (obstructive sleep apnea) 09/06/2015  . Hypersomnia with sleep apnea 07/08/2015  . RLS (restless legs syndrome) 07/08/2015  . Cramp of both lower extremities 06/23/2015  . Personal history of DVT (deep vein thrombosis) 06/23/2015  . History of snoring 06/23/2015  . Excessive somnolence disorder 06/23/2015  . Acute pulmonary embolism (HCC) 06/23/2015  . Obesity, morbid (HCC) 12/25/2014  . Cellulitis 12/17/2013  . 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 Depressive disorder 12/03/2013  . Pulmonary embolism (HCC) 11/26/2013  . Screening for prostate cancer 09/24/2012  .  Encounter for long-term (current) use of other medications 09/24/2012  . Abnormal breath sounds 02/13/2011  . Visual loss 02/19/2009  . HYPERCHOLESTEROLEMIA 07/17/2007  . ANEMIA, PERNICIOUS 04/03/2007  . Diabetes (HCC) 12/05/2006  . Essential hypertension 12/05/2006  . Coronary atherosclerosis 12/05/2006    Past Surgical History:  Procedure Laterality Date  . CARDIAC CATHETERIZATION  1990s; 2012  . CATARACT EXTRACTION W/ INTRAOCULAR LENS IMPLANT Right 2017  . CORONARY STENT INTERVENTION N/A 06/27/2016   Procedure: Coronary Stent Intervention;  Surgeon: Marykay Lex, MD;  Location: [facility] INVASIVE CV LAB;  Service: Cardiovascular;  Laterality: N/A;  . EYE SURGERY Right    Cataract  . INGUINAL HERNIA REPAIR Bilateral 1990s  . INTRAVASCULAR PRESSURE WIRE/FFR STUDY N/A 06/27/2016   Procedure: Intravascular Pressure Wire/FFR Study;  Surgeon: Marykay Lex, MD;  Location: [facility] INVASIVE CV LAB;  Service: Cardiovascular;  Laterality: N/A;  . LEFT HEART CATH AND CORONARY ANGIOGRAPHY N/A 06/27/2016   Procedure: Left Heart Cath and Coronary Angiography;  Surgeon: Marykay Lex, MD;  Location: [facility] INVASIVE CV LAB;  Service: Cardiovascular;  Laterality: N/A;  . REATTACHMENT HAND Left ~ 2001   w/carpal tunnel release  . UMBILICAL HERNIA REPAIR  1990s   w/I[facility]    Family History  Problem Relation Age of Onset  . COPD Mother   . Lymphoma Father     Social History   Socioeconomic History  . Marital status: Married    Spouse name: Not on file  . Number of children: Not on file  . Years of education: Not on file  . Highest education level: Not on file  Occupational History  . Occupation: Disabled    Employer: DISABLED  Social Needs  . Financial resource strain: Not on file  . Food insecurity:    Worry: Not on file    Inability: Not on file  . Transportation needs:    Medical: Not on file    Non-medical: Not on file  Tobacco Use  . Smoking status: Former Smoker    Packs/day: 0.10     Years: 3.00    Pack years: 0.30    Types: Cigarettes    Last attempt to quit: 05/15/1984    Years since quitting: 33.6  . Smokeless tobacco: Never Used  Substance and Sexual Activity  . Alcohol use: No    Frequency: Never  . Drug use: No  . Sexual activity: Not on file  Lifestyle  . Physical activity:    Days per week: Not on file    Minutes per session: Not on file  . Stress: Not on file  Relationships  . Social connections:    Talks  on phone: Not on file    Gets together: Not on file    Attends religious service: Not on file    Active member of club or organization: Not on file    Attends meetings of clubs or organizations: Not on file    Relationship status: Not on file  . Intimate partner violence:    Fear of current or ex partner: Not on file    Emotionally abused: Not on file    Physically abused: Not on file    Forced sexual activity: Not on file  Other Topics Concern  . Not on file  Social History Narrative   Disabled due to left wrist injury at work     Current Outpatient Medications:  .  acetaminophen (TYLENOL) 500 MG tablet, Take 1,000 mg by mouth every 6 (six) hours as needed., Disp: , Rfl:  .  aspirin 81 MG chewable tablet, Chew 1 tablet (81 mg total) by mouth daily., Disp: , Rfl:  .  bromocriptine (PARLODEL) 2.5 MG tablet, 1/4 tab daily, Disp: 25 tablet, Rfl: 11 .  clopidogrel (PLAVIX) 75 MG tablet, TAKE 1 TABLET BY MOUTH EVERY DAY, Disp: 90 tablet, Rfl: 3 .  diazepam (VALIUM) 10 MG tablet, Take 10 mg by mouth 3 (three) times daily., Disp: , Rfl:  .  DULoxetine (CYMBALTA) 30 MG capsule, Take 30 mg by mouth daily.  , Disp: , Rfl:  .  EPINEPHrine (EPIPEN 2-PAK) 0.3 mg/0.3 mL IJ SOAJ injection, Inject 0.3 mLs (0.3 mg total) into the muscle once for 1 dose., Disp: 2 Device, Rfl: 5 .  fluconazole (DIFLUCAN) 100 MG tablet, Take 1 tablet (100 mg total) by mouth daily., Disp: 5 tablet, Rfl: 5 .  FLUoxetine (PROZAC) 40 MG capsule, Take 40 mg by mouth daily.  , Disp: ,  Rfl:  .  fluticasone (FLONASE) 50 MCG/ACT nasal spray, USE ONE SPRAY IN EACH NOSTRIL TWICE A DAY, Disp: 16 g, Rfl: 1 .  hydrochlorothiazide (HYDRODIURIL) 12.5 MG tablet, TAKE 1 TABLET BY MOUTH EVERY DAY WITH THE LOSARTAN, Disp: 90 tablet, Rfl: 1 .  losartan (COZAAR) 50 MG tablet, TAKE 1 TABLET BY MOUTH EVERY DAY WITH THE HCTZ, Disp: 90 tablet, Rfl: 1 .  losartan-hydrochlorothiazide (HYZAAR) 50-12.5 MG tablet, TAKE 1 TABLET BY MOUTH DAILY., Disp: 30 tablet, Rfl: 5 .  metFORMIN (GLUCOPHAGE-XR) 500 MG 24 hr tablet, TAKE 2 TABLETS (1,000 MG TOTAL) BY MOUTH 2 (TWO) TIMES DAILY., Disp: 360 tablet, Rfl: 0 .  nitroGLYCERIN (NITROSTAT) 0.4 MG SL tablet, Place 1 tablet (0.4 mg total) under the tongue every 5 (five) minutes as needed for chest pain., Disp: 10 tablet, Rfl: 0 .  nystatin cream (MYCOSTATIN), Apply to affected area 2 times daily, Disp: 60 g, Rfl: 0 .  ONETOUCH VERIO test strip, USE TO CHECK BLOOD SUGAR 1 TIME PER DAY. DX CODE: E11.9, Disp: 100 each, Rfl: 2 .  OZEMPIC 1 MG/DOSE SOPN, INJECT 1 MG INTO THE SKIN ONCE A WEEK., Disp: 12 pen, Rfl: 3 .  rosuvastatin (CRESTOR) 5 MG tablet, Take 1 tablet (5 mg total) by mouth daily., Disp: 90 tablet, Rfl: 3 .  XARELTO 20 MG TABS tablet, TAKE 1 TABLET BY MOUTH EVERY DAY BEFORE SUPPER, Disp: 90 tablet, Rfl: 0 .  JARDIANCE 10 MG TABS tablet, TAKE 1 TABLET BY MOUTH DAILY. (Patient not taking: Reported on 01/10/2018), Disp: 30 tablet, Rfl: 3  Allergies  Allergen Reactions  . Actos [Pioglitazone] Other (See Comments)    Potential cause of DVT  .  Bee Venom Anaphylaxis  . Klonopin [Clonazepam] Anaphylaxis  . Invokana [Canagliflozin] Nausea Only    Nausea/dizzy/bad taste in mouth  . Niacin Other (See Comments)    "makes his crazy"     ROS See HPI  Objective  Vitals:   01/10/18 1339  BP: 126/80  Pulse: 86  SpO2: 95%  Weight: 253 lb (114.8 kg)  Height: 6' (1.829 m)    Body mass index is 34.31 kg/m.  Physical Exam Vital signs  reviewed. Constitutional: Patient appears well-developed and well-nourished. No distress.  HENT: Head: Normocephalic and atraumatic. Nose: Nose normal. Mouth/Throat: Oropharynx is clear and moist. No oropharyngeal exudate.  Eyes: Conjunctivae and EOM are normal. Pupils are equal, round, and reactive to light. No scleral icterus.  Neck: Normal range of motion. Neck supple. Cardiovascular: Normal rate, regular rhythm and normal heart sounds.  No murmur heard.  Distal pulses intact. Pulmonary/Chest: Effort normal and breath sounds normal. No respiratory distress. Musculoskeletal: Normal range of motion, No gross deformities Neurological: He is alert and oriented to person, place, and time. No cranial nerve deficit. Coordination, balance, strength, speech and gait are normal.  Skin: Skin is warm and dry. No rash noted. No erythema.  Psychiatric: Patient has a normal mood and affect. behavior is normal. Judgment and thought content normal.    Assessment & Plan F/U TBD pending labs today 10/10/17: CBC WNL, hepatic function panel WNL, TSH WNL, BMET stable  -Reviewed Health Maintenance:  Need for influenza vaccination- Flu Vaccine QUAD 36+ mos IM  Bee sting allergy Epipen Rx renewed He is aware that he should seek emergency care immediately upon use of the EPIpen - EPINEPHrine (EPIPEN 2-PAK) 0.3 mg/0.3 mL IJ SOAJ injection; Inject 0.3 mLs (0.3 mg total) into the muscle once for 1 dose.  Dispense: 2 Device; Refill: 5

## 2018-01-19 ENCOUNTER — Encounter: Payer: Self-pay | Admitting: Nurse Practitioner

## 2018-01-21 ENCOUNTER — Encounter: Payer: Self-pay | Admitting: Endocrinology

## 2018-01-22 ENCOUNTER — Other Ambulatory Visit: Payer: Self-pay | Admitting: Nurse Practitioner

## 2018-01-22 DIAGNOSIS — E782 Mixed hyperlipidemia: Secondary | ICD-10-CM

## 2018-01-22 MED ORDER — FENOFIBRATE 48 MG PO TABS: 48.0000 mg | ORAL_TABLET | Freq: Every day | ORAL | 1 refills | Status: DC

## 2018-01-29 ENCOUNTER — Other Ambulatory Visit: Payer: Self-pay | Admitting: Endocrinology

## 2018-01-29 NOTE — Telephone Encounter (Signed)
If pt wants to f/u DM with me, that is fine.  If so, Please come back for a follow-up appointment in 3 months

## 2018-01-29 NOTE — Telephone Encounter (Signed)
SE-When are you wanting pt to F/U? Plz advise/thx dmf

## 2018-01-30 ENCOUNTER — Other Ambulatory Visit: Payer: Self-pay | Admitting: Endocrinology

## 2018-02-07 ENCOUNTER — Encounter: Payer: Self-pay | Admitting: Endocrinology

## 2018-02-14 ENCOUNTER — Other Ambulatory Visit: Payer: Self-pay | Admitting: Nurse Practitioner

## 2018-02-14 DIAGNOSIS — E782 Mixed hyperlipidemia: Secondary | ICD-10-CM

## 2018-03-07 ENCOUNTER — Other Ambulatory Visit: Payer: Self-pay | Admitting: Endocrinology

## 2018-03-08 ENCOUNTER — Other Ambulatory Visit: Payer: Self-pay | Admitting: Nurse Practitioner

## 2018-03-08 DIAGNOSIS — E782 Mixed hyperlipidemia: Secondary | ICD-10-CM

## 2018-03-19 ENCOUNTER — Other Ambulatory Visit: Payer: Self-pay | Admitting: Internal Medicine

## 2018-03-27 ENCOUNTER — Other Ambulatory Visit: Payer: Self-pay | Admitting: Endocrinology

## 2018-03-31 ENCOUNTER — Other Ambulatory Visit: Payer: Self-pay | Admitting: Endocrinology

## 2018-03-31 NOTE — Telephone Encounter (Signed)
Please refill x 1 Ov is due  

## 2018-04-04 ENCOUNTER — Other Ambulatory Visit: Payer: Self-pay | Admitting: Nurse Practitioner

## 2018-04-04 DIAGNOSIS — E782 Mixed hyperlipidemia: Secondary | ICD-10-CM

## 2018-04-24 ENCOUNTER — Other Ambulatory Visit: Payer: Self-pay | Admitting: Endocrinology

## 2018-05-02 ENCOUNTER — Other Ambulatory Visit: Payer: Self-pay | Admitting: Endocrinology

## 2018-05-02 ENCOUNTER — Other Ambulatory Visit: Payer: Self-pay | Admitting: Nurse Practitioner

## 2018-05-02 DIAGNOSIS — E782 Mixed hyperlipidemia: Secondary | ICD-10-CM

## 2018-06-05 ENCOUNTER — Other Ambulatory Visit: Payer: Self-pay | Admitting: Endocrinology

## 2018-06-05 NOTE — Telephone Encounter (Signed)
Please advise if refill is appropriate 

## 2018-06-05 NOTE — Telephone Encounter (Signed)
Please forward refill request to pt's new primary care provider.  

## 2018-06-07 ENCOUNTER — Other Ambulatory Visit: Payer: Self-pay | Admitting: Endocrinology

## 2018-06-07 NOTE — Telephone Encounter (Signed)
Please forward refill request to pt's new primary care provider.  

## 2018-06-11 ENCOUNTER — Other Ambulatory Visit: Payer: Self-pay | Admitting: Internal Medicine

## 2018-06-13 ENCOUNTER — Other Ambulatory Visit: Payer: Self-pay | Admitting: Endocrinology

## 2018-06-16 ENCOUNTER — Other Ambulatory Visit: Payer: Self-pay | Admitting: Endocrinology

## 2018-06-16 NOTE — Telephone Encounter (Signed)
Please refill x 1 Ov is due  

## 2018-06-24 ENCOUNTER — Other Ambulatory Visit: Payer: Self-pay | Admitting: Endocrinology

## 2018-06-24 MED ORDER — HYDROCHLOROTHIAZIDE 12.5 MG PO TABS: ORAL_TABLET | ORAL | 1 refills | Status: DC

## 2018-06-24 MED ORDER — LOSARTAN POTASSIUM 50 MG PO TABS: ORAL_TABLET | ORAL | 1 refills | Status: DC

## 2018-06-24 NOTE — Telephone Encounter (Signed)
Can we let him know I have sent refills of his blood pressure medications

## 2018-06-24 NOTE — Telephone Encounter (Signed)
Patient has not been seen since May of 2019. Would you like the patient to be seen before refilling?

## 2018-06-24 NOTE — Telephone Encounter (Signed)
Dr. Everardo All would prefer the patients' PCP now begin to prescribe these medications.

## 2018-06-24 NOTE — Addendum Note (Signed)
Addended by: Evaristo Bury on: 06/24/2018 12:42 PM   Modules accepted: Orders

## 2018-06-24 NOTE — Telephone Encounter (Signed)
Please forward refill request to pt's new primary care provider.  

## 2018-07-08 ENCOUNTER — Other Ambulatory Visit: Payer: Self-pay | Admitting: *Deleted

## 2018-07-08 MED ORDER — LOSARTAN POTASSIUM 100 MG PO TABS: 50.0000 mg | ORAL_TABLET | Freq: Every day | ORAL | 1 refills | Status: DC

## 2018-07-08 NOTE — Telephone Encounter (Signed)
Received a fax stating Losartan 50 mg is on backorder. Pharmacy is requesting new Rx for different dose. New Rx sent. See meds.

## 2018-08-05 ENCOUNTER — Other Ambulatory Visit: Payer: Self-pay | Admitting: *Deleted

## 2018-08-05 DIAGNOSIS — E782 Mixed hyperlipidemia: Secondary | ICD-10-CM

## 2018-08-05 MED ORDER — FENOFIBRATE 48 MG PO TABS: 48.0000 mg | ORAL_TABLET | Freq: Every day | ORAL | 2 refills | Status: DC

## 2018-08-06 ENCOUNTER — Other Ambulatory Visit: Payer: Self-pay | Admitting: Endocrinology

## 2018-08-13 ENCOUNTER — Other Ambulatory Visit: Payer: Self-pay | Admitting: Endocrinology

## 2018-08-24 ENCOUNTER — Other Ambulatory Visit: Payer: Self-pay | Admitting: Endocrinology

## 2018-08-25 NOTE — Telephone Encounter (Signed)
Please forward refill request to pt's new primary care provider.  

## 2018-08-26 ENCOUNTER — Other Ambulatory Visit: Payer: Self-pay | Admitting: Family

## 2018-08-26 DIAGNOSIS — E782 Mixed hyperlipidemia: Secondary | ICD-10-CM

## 2018-08-26 MED ORDER — FENOFIBRATE 48 MG PO TABS: 48.0000 mg | ORAL_TABLET | Freq: Every day | ORAL | 0 refills | Status: DC

## 2018-09-04 ENCOUNTER — Other Ambulatory Visit: Payer: Self-pay | Admitting: Pulmonary Disease

## 2018-09-17 ENCOUNTER — Other Ambulatory Visit: Payer: Self-pay | Admitting: Endocrinology

## 2018-09-17 ENCOUNTER — Telehealth: Payer: Self-pay

## 2018-09-17 NOTE — Telephone Encounter (Signed)
Unable to process refill request at this time without an appt. LVM requesting returned call.

## 2018-09-19 ENCOUNTER — Encounter: Payer: Self-pay | Admitting: Endocrinology

## 2018-09-19 ENCOUNTER — Ambulatory Visit (INDEPENDENT_AMBULATORY_CARE_PROVIDER_SITE_OTHER): Payer: Medicare Other | Admitting: Endocrinology

## 2018-09-19 ENCOUNTER — Other Ambulatory Visit: Payer: Self-pay

## 2018-09-19 VITALS — BP 112/78 | HR 83 | Ht 72.0 in | Wt 250.0 lb

## 2018-09-19 DIAGNOSIS — M25511 Pain in right shoulder: Secondary | ICD-10-CM | POA: Diagnosis not present

## 2018-09-19 DIAGNOSIS — E119 Type 2 diabetes mellitus without complications: Secondary | ICD-10-CM

## 2018-09-19 LAB — POCT GLYCOSYLATED HEMOGLOBIN (HGB A1C): Hemoglobin A1C: 7 % — AB (ref 4.0–5.6)

## 2018-09-19 LAB — BASIC METABOLIC PANEL
BUN: 16 mg/dL (ref 6–23)
CO2: 27 mEq/L (ref 19–32)
Calcium: 9.9 mg/dL (ref 8.4–10.5)
Chloride: 99 mEq/L (ref 96–112)
Creatinine, Ser: 1.18 mg/dL (ref 0.40–1.50)
GFR: 64.76 mL/min (ref >60.00)
Glucose, Bld: 131 mg/dL — ABNORMAL HIGH (ref 70–99)
Potassium: 4.7 mEq/L (ref 3.5–5.1)
Sodium: 135 mEq/L (ref 135–145)

## 2018-09-19 MED ORDER — BROMOCRIPTINE MESYLATE 2.5 MG PO TABS: 2.5000 mg | ORAL_TABLET | Freq: Every day | ORAL | 3 refills | Status: DC

## 2018-09-19 MED ORDER — EMPAGLIFLOZIN 25 MG PO TABS: 25.0000 mg | ORAL_TABLET | Freq: Every day | ORAL | 3 refills | Status: DC

## 2018-09-19 NOTE — Progress Notes (Signed)
Subjective:    Patient ID: Xavier [name], male    DOB: 1965/10/03, 53 y.o.   MRN: [id number]  HPI Pt returns for f/u of diabetes mellitus:  DM type: 2 Dx'ed: 2005 Complications: mild CAD, and polyneuropathy.   Therapy: Ozempic and 3 oral meds.   DKA: never Severe hypoglycemia: never.   Pancreatitis: never.  Other: he did not tolerate pioglitizone (edema); he has never taken insulin, except in the hospital.  Interval history: no cbg record, but states cbg's are well-controlled.  pt states he feels well in general.  Past Medical History:  Diagnosis Date  . ANEMIA, PERNICIOUS 04/03/2007  . Anxiety   . Arthritis    "left hand" (06/26/2016)  . Chronic lower back pain   . Complication of anesthesia    "w/hand OR; spouse had to go into PACU & nudge me q little bit; just wouldn't wake up"  . CORONARY ARTERY DISEASE 12/05/2006  . Depression   . DIABETES MELLITUS, TYPE II dx'd 2001  . DVT (deep venous thrombosis) (HCC) 2016   BLE  . GERD (gastroesophageal reflux disease)   . History of blood transfusion 2001   "when I had my hand OR"  . History of gout   . History of kidney stones   . HYPERCHOLESTEROLEMIA 07/17/2007  . HYPERTENSION 12/05/2006  . Learning disability   . Migraine    "1-2/month" (06/26/2016)  . Myocardial infarction (HCC) ~ 2003  . OSA on CPAP   . PE (pulmonary embolism) 2016  . Stroke [facility]) ~ 2006   denies residual on 06/26/2016  . TIA (transient ischemic attack) ?06/25/2016  . VISUAL ACUITY, DECREASED, RIGHT EYE 02/19/2009    Past Surgical History:  Procedure Laterality Date  . CARDIAC CATHETERIZATION  1990s; 2012  . CATARACT EXTRACTION W/ INTRAOCULAR LENS IMPLANT Right 2017  . CORONARY STENT INTERVENTION N/A 06/27/2016   Procedure: Coronary Stent Intervention;  Surgeon: Marykay Lex, MD;  Location: [facility] INVASIVE CV LAB;  Service: Cardiovascular;  Laterality: N/A;  . EYE SURGERY Right    Cataract  . INGUINAL HERNIA REPAIR Bilateral 1990s  . INTRAVASCULAR  PRESSURE WIRE/FFR STUDY N/A 06/27/2016   Procedure: Intravascular Pressure Wire/FFR Study;  Surgeon: Marykay Lex, MD;  Location: [facility] INVASIVE CV LAB;  Service: Cardiovascular;  Laterality: N/A;  . LEFT HEART CATH AND CORONARY ANGIOGRAPHY N/A 06/27/2016   Procedure: Left Heart Cath and Coronary Angiography;  Surgeon: Marykay Lex, MD;  Location: [facility] INVASIVE CV LAB;  Service: Cardiovascular;  Laterality: N/A;  . REATTACHMENT HAND Left ~ 2001   w/carpal tunnel release  . UMBILICAL HERNIA REPAIR  1990s   w/[facility]    Social History   Socioeconomic History  . Marital status: Married    Spouse name: Not on file  . Number of children: Not on file  . Years of education: Not on file  . Highest education level: Not on file  Occupational History  . Occupation: Disabled    Employer: DISABLED  Social Needs  . Financial resource strain: Not on file  . Food insecurity:    Worry: Not on file    Inability: Not on file  . Transportation needs:    Medical: Not on file    Non-medical: Not on file  Tobacco Use  . Smoking status: Former Smoker    Packs/day: 0.10    Years: 3.00    Pack years: 0.30    Types: Cigarettes    Last attempt to quit: 05/15/1984    Years  since quitting: 34.3  . Smokeless tobacco: Never Used  Substance and Sexual Activity  . Alcohol use: No    Frequency: Never  . Drug use: No  . Sexual activity: Not on file  Lifestyle  . Physical activity:    Days per week: Not on file    Minutes per session: Not on file  . Stress: Not on file  Relationships  . Social connections:    Talks on phone: Not on file    Gets together: Not on file    Attends religious service: Not on file    Active member of club or organization: Not on file    Attends meetings of clubs or organizations: Not on file    Relationship status: Not on file  . Intimate partner violence:    Fear of current or ex partner: Not on file    Emotionally abused: Not on file    Physically abused: Not on file     Forced sexual activity: Not on file  Other Topics Concern  . Not on file  Social History Narrative   Disabled due to left wrist injury at work    Current Outpatient Medications on File Prior to Visit  Medication Sig Dispense Refill  . acetaminophen (TYLENOL) 500 MG tablet Take 1,000 mg by mouth every 6 (six) hours as needed.    Marland Kitchen. aspirin 81 MG chewable tablet Chew 1 tablet (81 mg total) by mouth daily.    . clopidogrel (PLAVIX) 75 MG tablet TAKE 1 TABLET BY MOUTH EVERY DAY 90 tablet 3  . diazepam (VALIUM) 10 MG tablet Take 10 mg by mouth 3 (three) times daily.    . DULoxetine (CYMBALTA) 30 MG capsule Take 30 mg by mouth daily.      . fenofibrate (TRICOR) 48 MG tablet Take 1 tablet (48 mg total) by mouth daily. Needs appointment 30 tablet 0  . FLUoxetine (PROZAC) 40 MG capsule Take 40 mg by mouth daily.      . fluticasone (FLONASE) 50 MCG/ACT nasal spray USE ONE SPRAY IN EACH NOSTRIL TWICE A DAY 16 g 2  . hydrochlorothiazide (HYDRODIURIL) 12.5 MG tablet TAKE 1 TABLET BY MOUTH EVERY DAY WITH THE LOSARTAN 90 tablet 1  . losartan (COZAAR) 100 MG tablet Take 0.5 tablets (50 mg total) by mouth daily. 45 tablet 1  . metFORMIN (GLUCOPHAGE-XR) 500 MG 24 hr tablet TAKE 2 TABLETS BY MOUTH TWICE A DAY 360 tablet 0  . nitroGLYCERIN (NITROSTAT) 0.4 MG SL tablet Place 1 tablet (0.4 mg total) under the tongue every 5 (five) minutes as needed for chest pain. 10 tablet 0  . nystatin cream (MYCOSTATIN) Apply to affected area 2 times daily 60 g 0  . ONETOUCH VERIO test strip USE TO CHECK BLOOD SUGAR 1 TIME PER DAY. DX CODE: E11.9 100 each 2  . OZEMPIC 1 MG/DOSE SOPN INJECT 1 MG INTO THE SKIN ONCE A WEEK. 12 pen 3  . rosuvastatin (CRESTOR) 5 MG tablet Take 1 tablet (5 mg total) by mouth daily. 90 tablet 3  . XARELTO 20 MG TABS tablet TAKE 1 TABLET BY MOUTH EVERY DAY BEFORE SUPPER 90 tablet 0   No current facility-administered medications on file prior to visit.     Allergies  Allergen Reactions  . Actos  [Pioglitazone] Other (See Comments)    Potential cause of DVT  . Bee Venom Anaphylaxis  . Klonopin [Clonazepam] Anaphylaxis  . Invokana [Canagliflozin] Nausea Only    Nausea/dizzy/bad taste in mouth  . Niacin  Other (See Comments)    "makes his crazy"    Family History  Problem Relation Age of Onset  . COPD Mother   . Lymphoma Father     BP 112/78 (BP Location: Left Arm, Patient Position: Sitting, Cuff Size: Large)   Pulse 83   Ht 6' (1.829 m)   Wt 250 lb (113.4 kg)   SpO2 95%   BMI 33.91 kg/m    Review of Systems Penile redness and irritation are resolved.  He has right shoulder pain (no injury).      Objective:   Physical Exam VITAL SIGNS:  See vs page GENERAL: no distress Pulses: dorsalis pedis intact bilat.   MSK: no deformity of the feet CV: no leg edema Skin:  no ulcer on the feet.  normal color and temp on the feet. Neuro: sensation is intact to touch on the feet.   A1c=7.0%  Lab Results  Component Value Date   CREATININE 1.10 10/10/2017   BUN 15 10/10/2017   NA 135 10/10/2017   K 4.8 10/10/2017   CL 95 (L) 10/10/2017   CO2 29 10/10/2017       Assessment & Plan:  Shoulder pain, new, uncertain etiology.  Type 2 DM, with CAD: well-controlled.  Edema, by hx: This limits rx options   Patient Instructions  I have sent prescriptions to your pharmacy, to increase the jardiance and bromocriptine check your blood sugar once a day.  vary the time of day when you check, between before the 3 meals, and at bedtime.  also check if you have symptoms of your blood sugar being too high or too low.  please keep a record of the readings and bring it to your next appointment here (or you can bring the meter itself).  You can write it on any piece of paper.  please call us sooner if your blood sugar goes below 70, or if you have a lot of readings over 200. blood tests are requested for you today.  We'll let you know about the results.  Please see a specialist, for you  shoulder pain.  you will receive a phone call, about a day and time for an appointment. Please come back for a follow-up appointment in 4-6 months

## 2018-09-19 NOTE — Patient Instructions (Addendum)
I have sent prescriptions to your pharmacy, to increase the jardiance and bromocriptine check your blood sugar once a day.  vary the time of day when you check, between before the 3 meals, and at bedtime.  also check if you have symptoms of your blood sugar being too high or too low.  please keep a record of the readings and bring it to your next appointment here (or you can bring the meter itself).  You can write it on any piece of paper.  please call us sooner if your blood sugar goes below 70, or if you have a lot of readings over 200. blood tests are requested for you today.  We'll let you know about the results.  Please see a specialist, for you shoulder pain.  you will receive a phone call, about a day and time for an appointment. Please come back for a follow-up appointment in 4-6 months

## 2018-09-20 ENCOUNTER — Telehealth: Payer: Self-pay | Admitting: Endocrinology

## 2018-09-20 NOTE — Telephone Encounter (Signed)
[employer]DME called for confirmation of receipt for fax sent 05/01 and 05/05 regarding DME.

## 2018-09-20 NOTE — Telephone Encounter (Signed)
Documents just received today. Placed on Dr. George Hugh desk for him to complete and sign. Will fax back once completed and returned.

## 2018-09-24 ENCOUNTER — Telehealth: Payer: Self-pay | Admitting: Emergency Medicine

## 2018-09-24 NOTE — Telephone Encounter (Signed)
Copied from CRM [id number]. Topic: General - Other >> Sep 23, 2018  4:20 PM Elliot Gault wrote: Jethro Bolus name: Barbara Cower Relation to pt: [employer]  Call back number: [phone]   Reason for call:  Forms faxed over 08/20/2018 re fax 09/23/2018  for durable medical equipment for back lumbar support requesting provider signature and check dx code. Forms faxed to [phone]

## 2018-09-24 NOTE — Telephone Encounter (Signed)
Pt does not have any DX of back issues. I do believe this is a SPAM fax

## 2018-09-25 ENCOUNTER — Ambulatory Visit (INDEPENDENT_AMBULATORY_CARE_PROVIDER_SITE_OTHER): Payer: Medicare Other | Admitting: Sports Medicine

## 2018-09-25 ENCOUNTER — Encounter: Payer: Self-pay | Admitting: Sports Medicine

## 2018-09-25 ENCOUNTER — Other Ambulatory Visit: Payer: Self-pay

## 2018-09-25 VITALS — BP 136/80 | Ht 72.0 in | Wt 250.0 lb

## 2018-09-25 DIAGNOSIS — M25511 Pain in right shoulder: Secondary | ICD-10-CM

## 2018-09-25 MED ORDER — TRAMADOL HCL 50 MG PO TABS: ORAL_TABLET | ORAL | 1 refills | Status: DC

## 2018-09-25 MED ORDER — METHYLPREDNISOLONE ACETATE 40 MG/ML IJ SUSP
40.0000 mg | Freq: Once | INTRAMUSCULAR | Status: AC
  Administered 2018-09-25: 40 mg via INTRA_ARTICULAR

## 2018-09-25 NOTE — Telephone Encounter (Signed)
I called pt- he states he did request this form for his back and left  wrist. He also need to establish with a new PCP.

## 2018-09-25 NOTE — Telephone Encounter (Signed)
I informed Barbara Cower from [employer] equipment to re-fax forms for patient and to leave physician info blank.

## 2018-09-25 NOTE — Progress Notes (Signed)
   Subjective:    Patient ID: Xavier [name], male    DOB: [birth year], 53 y.o.   MRN: [id number]  HPI chief complaint: Right shoulder pain  Very pleasant 53 year old right-hand-dominant male comes in today complaining of 1 month of right shoulder pain.  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  Pain is along the anterior lateral shoulder and is worse at night.  He is unable to sleep on his right side secondary to pain.  He also endorses pain with reaching directly overhead or behind his back.  He has not noticed any weakness. He does get some radiating pain down the right arm.  No neck pain.  He denies similar problems in the past.  No prior shoulder surgeries.  He has tried some topical pain reliever as well as over-the-counter Advil without relief.  Past medical history reviewed Medications reviewed Allergies reviewed   Review of Systems    As above Objective:   Physical Exam  Well-developed, well-nourished.  No acute distress.  Awake alert and oriented x3.  Vital signs reviewed.  Right shoulder: Patient has full and active range of motion with a positive painful arc.  No tenderness to palpation.  Positive empty can, positive Hawkins.  Rotator cuff strength is 5/5 but does reproduce pain with resisted supraspinatus.  Negative O'Brien's.  Neurovascularly intact distally.     Assessment & Plan:   Right shoulder pain likely secondary to subacromial bursitis/rotator cuff tendinitis  Patient has good strength on exam today.  I recommended that we try subacromial cortisone injection for pain relief.  Patient is in agreement with this.  I have given him some simple range of motion exercises to do at home and he will follow-up with me again in 3 weeks.  I have also given him a limited amount of tramadol to take at night if needed for pain.  If symptoms persist at follow-up consider imaging to  rule out rotator cuff tear.  Patient will call with questions or concerns in the interim.  Consent obtained and verified. Time-out conducted. Noted no overlying erythema, induration, or other signs of local infection. Skin prepped in a sterile fashion. Topical analgesic spray: Ethyl chloride. Joint: right shoulder (subacromial) Needle: 25g 1.5 inch Completed without difficulty. Meds: 3cc 1% xylocaine, 1cc (40mg ) depomedrol  Advised to call if fevers/chills, erythema, induration, drainage, or persistent bleeding.

## 2018-09-25 NOTE — Telephone Encounter (Signed)
Ok, these have been signed, thanks

## 2018-09-25 NOTE — Telephone Encounter (Signed)
Forms received. Will forward to Dr. Jonny Ruiz for review.

## 2018-09-25 NOTE — Telephone Encounter (Addendum)
Signed forms faxed to number on fax. Pt informed.

## 2018-09-26 ENCOUNTER — Telehealth: Payer: Self-pay | Admitting: Endocrinology

## 2018-09-26 NOTE — Telephone Encounter (Signed)
Document placed on Dr. George Hugh desk. States this is not something he can complete. This is a document that needs completed by PCP. Faxed back to company informing them as well. Confirmation received.

## 2018-09-26 NOTE — Telephone Encounter (Signed)
Company [employer] called in regards to faxing documents for patient getting a bras for his wrist.  Ph # [phone]

## 2018-09-28 ENCOUNTER — Other Ambulatory Visit: Payer: Self-pay | Admitting: Endocrinology

## 2018-09-29 NOTE — Telephone Encounter (Signed)
Please forward refill request to pt's new primary care provider.  

## 2018-10-01 ENCOUNTER — Other Ambulatory Visit: Payer: Self-pay | Admitting: Endocrinology

## 2018-10-05 ENCOUNTER — Other Ambulatory Visit: Payer: Self-pay | Admitting: Pulmonary Disease

## 2018-10-14 ENCOUNTER — Ambulatory Visit (INDEPENDENT_AMBULATORY_CARE_PROVIDER_SITE_OTHER): Payer: Medicare Other | Admitting: Sports Medicine

## 2018-10-14 ENCOUNTER — Other Ambulatory Visit: Payer: Self-pay

## 2018-10-14 ENCOUNTER — Encounter: Payer: Self-pay | Admitting: Sports Medicine

## 2018-10-14 DIAGNOSIS — M25511 Pain in right shoulder: Secondary | ICD-10-CM

## 2018-10-14 MED ORDER — TRAMADOL HCL 50 MG PO TABS: ORAL_TABLET | ORAL | 1 refills | Status: DC

## 2018-10-14 NOTE — Progress Notes (Signed)
   Subjective:    Patient ID: Xavier [name], male    DOB: October 16, 1965, 53 y.o.   MRN: [id number]  HPI   Patient comes in today for follow-up on right shoulder pain.  Pain is worse despite recent subacromial cortisone injection.  It is diffuse around his shoulder.  He is having problems with range of motion and just about every plane.  Great difficulty sleeping at night.  Tramadol has been somewhat helpful.   Review of Systems    As above Objective:   Physical Exam  Well-developed, well-nourished.  No acute distress.  Awake alert and oriented x3.  Vital signs reviewed  Right shoulder: Patient has limited active range of motion in all planes with a positive painful arc.  Good passive external rotation.  Rotator cuff strength is 4/5 with resisted supraspinatus.  5/5 with resisted external rotation and internal rotation.  No tenderness to palpation.  Neurovascularly intact distally.      Assessment & Plan:   Persistent right shoulder pain-rule out rotator cuff tear  MRI specifically to rule out a rotator cuff tear that may need operative intervention.  Phone follow-up with those results when available.  We will delineate further treatment based on those findings.  In the meantime, I have encouraged him to continue with his pendulum exercises to help prevent adhesive capsulitis and I have agreed to give him a few more tramadol to take as needed.

## 2018-10-21 ENCOUNTER — Ambulatory Visit
Admission: RE | Admit: 2018-10-21 | Discharge: 2018-10-21 | Disposition: A | Discharge status: Home / Self Care | Payer: Medicare Other | Source: Ambulatory Visit | Attending: Sports Medicine | Admitting: Sports Medicine

## 2018-10-21 ENCOUNTER — Other Ambulatory Visit: Payer: Self-pay

## 2018-10-21 DIAGNOSIS — M25511 Pain in right shoulder: Secondary | ICD-10-CM | POA: Diagnosis not present

## 2018-10-22 ENCOUNTER — Telehealth: Payer: Self-pay | Admitting: Sports Medicine

## 2018-10-22 NOTE — Telephone Encounter (Signed)
  I spoke with Xavier [name] on the phone today after reviewing the MRI of his right shoulder.  MRI shows moderate tendinosis of the rotator cuff with moderate arthropathy of the acromioclavicular joint.  He also has a superior posterior labral tear which extends into the remainder of the posterior labrum.  Patient has had symptoms for several months and has failed conservative treatment including a recent cortisone injection.  For that reason, I would like to refer him to Dr. Noemi Chapel to discuss merits of arthroscopy.  I will defer further treatment to the discretion of Dr. Noemi Chapel and the patient will follow-up with me as needed.

## 2018-10-22 NOTE — Telephone Encounter (Signed)
[facility] Dr Noemi Chapel Monday 10/28/18 @ 230p [address] [place] [address] [phone]

## 2018-10-28 DIAGNOSIS — M7501 Adhesive capsulitis of right shoulder: Secondary | ICD-10-CM | POA: Diagnosis not present

## 2018-10-29 ENCOUNTER — Telehealth: Payer: Self-pay

## 2018-10-29 NOTE — Telephone Encounter (Signed)
   [facility] Pre-operative Risk Assessment    Request for surgical clearance:  1. What type of surgery is being performed? Right Shoulder Scope and Manipulation   2. When is this surgery scheduled? TBD   3. What type of clearance is required (medical clearance vs. Pharmacy clearance to hold med vs. Both)? Both  4. Are there any medications that need to be held prior to surgery and how long? Plavix and Xarelto   5. Practice name and name of physician performing surgery? [facility]   6. What is your office phone number [phone]    7.   What is your office fax number [phone] ATTN: Xavier [name]  8.   Anesthesia type (None, local, MAC, general) ? None listed   Xavier [name] 10/29/2018, 10:05 AM  _________________________________________________________________   (provider comments below)

## 2018-10-29 NOTE — Telephone Encounter (Signed)
Can stop plavix Can hold xarelto 4 days before scope. Post can resume xarelto when ok with surgeon and resume just 81 mg ASA as his stent was over a year ago

## 2018-10-29 NOTE — Telephone Encounter (Signed)
   Primary Cardiologist: Jenkins Rouge, MD  Chart reviewed as part of pre-operative protocol coverage. Given past medical history and time since last visit, based on ACC/AHA guidelines, Xavier [name] would be at acceptable risk for the planned procedure without further cardiovascular testing. He is easily getting > 8mets of activity.   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 Other hx includes CAD, CRF, HTN and DM.  Dr. Johnsie Cancel, Can you given recommendations on Plavix and [name] (do we need to ask PCP)?     [place], [place] 10/29/2018, 10:45 AM

## 2018-10-31 ENCOUNTER — Other Ambulatory Visit: Payer: Self-pay | Admitting: Endocrinology

## 2018-10-31 ENCOUNTER — Ambulatory Visit (INDEPENDENT_AMBULATORY_CARE_PROVIDER_SITE_OTHER): Payer: Medicare Other | Admitting: Family Medicine

## 2018-10-31 ENCOUNTER — Encounter: Payer: Self-pay | Admitting: Family Medicine

## 2018-10-31 ENCOUNTER — Other Ambulatory Visit: Payer: Self-pay

## 2018-10-31 DIAGNOSIS — E7800 Pure hypercholesterolemia, unspecified: Secondary | ICD-10-CM

## 2018-10-31 DIAGNOSIS — Z9989 Dependence on other enabling machines and devices: Secondary | ICD-10-CM

## 2018-10-31 DIAGNOSIS — Z86718 Personal history of other venous thrombosis and embolism: Secondary | ICD-10-CM

## 2018-10-31 DIAGNOSIS — G4733 Obstructive sleep apnea (adult) (pediatric): Secondary | ICD-10-CM | POA: Diagnosis not present

## 2018-10-31 DIAGNOSIS — Z7901 Long term (current) use of anticoagulants: Secondary | ICD-10-CM | POA: Diagnosis not present

## 2018-10-31 DIAGNOSIS — I10 Essential (primary) hypertension: Secondary | ICD-10-CM

## 2018-10-31 DIAGNOSIS — Z86711 Personal history of pulmonary embolism: Secondary | ICD-10-CM | POA: Diagnosis not present

## 2018-10-31 DIAGNOSIS — Z125 Encounter for screening for malignant neoplasm of prostate: Secondary | ICD-10-CM | POA: Diagnosis not present

## 2018-10-31 DIAGNOSIS — M75111 Incomplete rotator cuff tear or rupture of right shoulder, not specified as traumatic: Secondary | ICD-10-CM

## 2018-10-31 NOTE — Progress Notes (Signed)
Virtual Visit via Video Note  I connected with pt on 11/03/18 at  1:30 PM EDT by a video enabled telemedicine application and verified that I am speaking with the correct person using two identifiers.  Location patient: home Location provider:work or home office Persons participating in the virtual visit: patient, provider  I discussed the limitations of evaluation and management by telemedicine and the availability of in person appointments. The patient expressed understanding and agreed to proceed.  Telemedicine visit is a necessity given the COVID-19 restrictions in place at the current time.  HPI: 53 y/o WM who is being seen to establish care today. Most recent PCP: Dr. Micheline Chapman.   Reviewed EPIC/HL EMR records before/during visit today. Currently states he feels very well.  No physical limitations with strenuous exercise/activity.  He has Right RC tear and will be undergoing surgical repair by Dr. Noemi Chapel after he gets clearance from his cardiologist (which has already been done) and myself. 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 These seem to have been unprovoked from information gathered by pt.  +Hx of CAD with stent placement and is on plavix (no ASA). Hx of remote CVA w/out residual deficit, TIA approx 2018. He is not limited in anything from a cardiopulmonary exertion aspect-->no CP or DOE.  He has OSA and wears CPAP but only for the last 2 weeks.  He was off it for quite a while prior.   He says he gets q 6 mo f/u--most recent f/u with Dr. Halford Chessman in EMR was 11/2017. He last followed up with Dr. Loanne Drilling 09/2018 and A1c was 7%. HLD: he takes low dose tricor and low dose crestor.  Review of Systems  Constitutional: Negative for appetite change, chills, fatigue and fever.  HENT: Negative for congestion, dental problem, ear pain and sore throat.   Eyes: Negative for discharge, redness and visual disturbance.  Respiratory: Negative for cough, chest tightness,  shortness of breath and wheezing.   Cardiovascular: Negative for chest pain, palpitations and leg swelling.  Gastrointestinal: Negative for abdominal pain, blood in stool, diarrhea, nausea and vomiting.  Genitourinary: Negative for difficulty urinating, dysuria, flank pain, frequency, hematuria and urgency.  Musculoskeletal: Positive for arthralgias (right shoulder). Negative for back pain, joint swelling, myalgias and neck stiffness.  Skin: Negative for pallor and rash.  Neurological: Negative for dizziness, speech difficulty, weakness and headaches.  Hematological: Negative for adenopathy. Does not bruise/bleed easily.  Psychiatric/Behavioral: Negative for confusion and sleep disturbance. The patient is not nervous/anxious.       Past Medical History:  Diagnosis Date  . ANEMIA, PERNICIOUS 04/03/2007  . Anxiety   . Arthritis    "left hand" (06/26/2016)  . Chronic lower back pain   . Colon cancer screening 10/27/2016   Cologuard NEG-->rpt 3 yrs  . Complication of anesthesia    "w/hand OR; spouse had to go into PACU & nudge me q little bit; just wouldn't wake up"  . CORONARY ARTERY DISEASE 12/05/2006  . Depression   . DIABETES MELLITUS, TYPE II dx'd 2001  . DVT (deep venous thrombosis) ([place]) 2016   BLE  . GERD (gastroesophageal reflux disease)   . History of blood transfusion 2001   "when I had my hand OR"  . History of gout   . History of kidney stones   . HYPERCHOLESTEROLEMIA 07/17/2007  . HYPERTENSION 12/05/2006  . Learning disability   . Migraine    "1-2/month" (06/26/2016)  . Myocardial infarction ([place]) ~ 2003  .  OSA on CPAP    Pt cannot recall setting clearly but thinks it is 5 cm h20.    . PE (pulmonary embolism) 2016  . Stroke [facility](H[facility]) ~ 2006   denies residual on 06/26/2016  . TIA (transient ischemic attack) ?06/25/2016  . VISUAL ACUITY, DECREASED, RIGHT EYE 02/19/2009    Past Surgical History:  Procedure Laterality Date  . CARDIAC CATHETERIZATION  1990s; 2012  .  CATARACT EXTRACTION W/ INTRAOCULAR LENS IMPLANT Right 2017  . CORONARY STENT INTERVENTION N/A 06/27/2016   Procedure: Coronary Stent Intervention;  Surgeon: Marykay Lexavid W Harding, MD;  [facility];  Service: Cardiovascular;  Laterality: N/A;  . EYE SURGERY Right    Cataract  . INGUINAL HERNIA REPAIR Bilateral 1990s  . INTRAVASCULAR PRESSURE WIRE/FFR STUDY N/A 06/27/2016   Procedure: Intravascular Pressure Wire/FFR Study;  Surgeon: Marykay Lexavid W Harding, MD;  [facility];  Service: Cardiovascular;  Laterality: N/A;  . LEFT HEART CATH AND CORONARY ANGIOGRAPHY N/A 06/27/2016   Procedure: Left Heart Cath and Coronary Angiography;  Surgeon: Marykay Lexavid W Harding, MD;  [facility];  Service: Cardiovascular;  Laterality: N/A;  . REATTACHMENT HAND Left ~ 2001   w/carpal tunnel release  . UMBILICAL HERNIA REPAIR  1990s   w/[facility]    Family History  Problem Relation Age of Onset  . COPD Mother   . Lymphoma Father     Social History   Socioeconomic History  . Marital status: Married    Spouse name: Not on file  . Number of children: Not on file  . Years of education: Not on file  . Highest education level: Not on file  Occupational History  . Occupation: Disabled    Employer: DISABLED  Social Needs  . Financial resource strain: Not on file  . Food insecurity    Worry: Not on file    Inability: Not on file  . Transportation needs    Medical: Not on file    Non-medical: Not on file  Tobacco Use  . Smoking status: Former Smoker    Packs/day: 0.10    Years: 3.00    Pack years: 0.30    Types: Cigarettes    Quit date: 05/15/1984    Years since quitting: 34.4  . Smokeless tobacco: Never Used  Substance and Sexual Activity  . Alcohol use: No    Frequency: Never  . Drug use: No  . Sexual activity: Not on file  Lifestyle  . Physical activity    Days per week: Not on file    Minutes per session: Not on file  . Stress: Not on file  Relationships  .  Social Musicianconnections    Talks on phone: Not on file    Gets together: Not on file    Attends religious service: Not on file    Active member of club or organization: Not on file    Attends meetings of clubs or organizations: Not on file    Relationship status: Not on file  Other Topics Concern  . Not on file  Social History Narrative   Married, has 2 sons, no grandchildren.   [place]   Disabled due to left wrist injury at work.   Tobacco: none   Alc: none     Not on ASA listed below.  Current Outpatient Medications:  .  acetaminophen (TYLENOL) 500 MG tablet, Take 1,000 mg by mouth every 6 (six) hours as needed., Disp: , Rfl:  .  bromocriptine (PARLODEL) 2.5 MG tablet, Take 1 tablet (2.5 mg total) by mouth daily. TAKE 1/4 TABLET BY MOUTH EVERY DAY, Disp: 45 tablet, Rfl: 3 .  clopidogrel (PLAVIX) 75 MG tablet, TAKE 1 TABLET BY MOUTH EVERY DAY, Disp: 90 tablet, Rfl: 3 .  diazepam (VALIUM) 10 MG tablet, Take 10 mg by mouth 3 (three) times daily., Disp: , Rfl:  .  DULoxetine (CYMBALTA) 30 MG capsule, Take 30 mg by mouth daily.  , Disp: , Rfl:  .  empagliflozin (JARDIANCE) 25 MG TABS tablet, Take 25 mg by mouth daily., Disp: 90 tablet, Rfl: 3 .  fenofibrate (TRICOR) 48 MG tablet, Take 1 tablet (48 mg total) by mouth daily. Needs appointment, Disp: 30 tablet, Rfl: 0 .  FLUoxetine (PROZAC) 40 MG capsule, Take 40 mg by mouth daily.  , Disp: , Rfl:  .  fluticasone (FLONASE) 50 MCG/ACT nasal spray, USE ONE SPRAY IN EACH NOSTRIL TWICE A DAY, Disp: 16 g, Rfl: 2 .  hydrochlorothiazide (HYDRODIURIL) 12.5 MG tablet, TAKE 1 TABLET BY MOUTH EVERY DAY WITH THE LOSARTAN, Disp: 90 tablet, Rfl: 1 .  losartan (COZAAR) 100 MG tablet, Take 0.5 tablets (50 mg total) by mouth daily., Disp: 45 tablet, Rfl: 1 .  nystatin cream (MYCOSTATIN), Apply to affected area 2 times daily, Disp: 60 g, Rfl: 0 .  ONETOUCH VERIO test strip, USE TO CHECK BLOOD SUGAR 1 TIME PER DAY. DX CODE: E11.9,  Disp: 100 each, Rfl: 2 .  OZEMPIC 1 MG/DOSE SOPN, INJECT 1 MG INTO THE SKIN ONCE A WEEK., Disp: 12 pen, Rfl: 3 .  rosuvastatin (CRESTOR) 5 MG tablet, Take 1 tablet (5 mg total) by mouth daily., Disp: 90 tablet, Rfl: 3 .  traMADol (ULTRAM) 50 MG tablet, Take 1-2 tabs at bedtime prn pain, Disp: 14 tablet, Rfl: 1 .  XARELTO 20 MG TABS tablet, TAKE 1 TABLET BY MOUTH EVERY DAY BEFORE SUPPER, Disp: 90 tablet, Rfl: 0 .  metFORMIN (GLUCOPHAGE) 500 MG tablet, Take 2 tablets by mouth twice daily, Disp: 180 tablet, Rfl: 3 .  nitroGLYCERIN (NITROSTAT) 0.4 MG SL tablet, Place 1 tablet (0.4 mg total) under the tongue every 5 (five) minutes as needed for chest pain. (Patient not taking: Reported on 10/31/2018), Disp: 10 tablet, Rfl: 0  EXAM:  VITALS per patient if applicable: There were no vitals taken for this visit.   GENERAL: alert, oriented, appears well and in no acute distress  HEENT: atraumatic, conjunttiva clear, no obvious abnormalities on inspection of external nose and ears  NECK: normal movements of the head and neck  LUNGS: on inspection no signs of respiratory distress, breathing rate appears normal, no obvious gross SOB, gasping or wheezing  CV: no obvious cyanosis  MS: moves all visible extremities without noticeable abnormality  PSYCH/NEURO: pleasant and cooperative, no obvious depression or anxiety, speech and thought processing grossly intact  LABS: none today  Lab Results  Component Value Date   VITAMINB12 329 01/10/2018    Lab Results  Component Value Date   TSH 2.73 10/10/2017   Lab Results  Component Value Date   WBC 8.9 10/10/2017   HGB 16.1 10/10/2017   HCT 46.7 10/10/2017   MCV 91.4 10/10/2017   PLT 368.0 10/10/2017   Lab Results  Component Value Date   CREATININE 1.18 09/19/2018   BUN 16 09/19/2018   NA 135 09/19/2018   K 4.7 09/19/2018   CL 99 09/19/2018   CO2 27 09/19/2018   Lab Results  Component Value Date  ALT 36 10/10/2017   AST 23  10/10/2017   ALKPHOS 70 10/10/2017   BILITOT 0.8 10/10/2017   Lab Results  Component Value Date   CHOL 148 01/10/2018   Lab Results  Component Value Date   HDL 49.60 01/10/2018   Lab Results  Component Value Date   LDLCALC (H) 06/11/2010    120        Total Cholesterol/HDL:CHD Risk Coronary Heart Disease Risk Table                     Men   Women  1/2 Average Risk   3.4   3.3  Average Risk       5.0   4.4  2 X Average Risk   9.6   7.1  3 X Average Risk  23.4   11.0        Use the calculated Patient Ratio above and the CHD Risk Table to determine the patient's CHD Risk.        ATP III CLASSIFICATION (LDL):  <100     mg/dL   Optimal  [id number]  mg/dL   Near or Above                    Optimal  130-159  mg/dL   Borderline  [id number]  mg/dL   High  >811>190     mg/dL   Very High   Lab Results  Component Value Date   TRIG 308.0 (H) 01/10/2018   Lab Results  Component Value Date   CHOLHDL 3 01/10/2018   Lab Results  Component Value Date   PSA 0.26 10/10/2017   PSA 0.13 09/12/2016   PSA 0.21 09/24/2012   Lab Results  Component Value Date   HGBA1C 7.0 (A) 09/19/2018   ASSESSMENT AND PLAN:  Discussed the following assessment and plan:  1) HTN: .contin Check lytes/cr good 09/2018.  2) HLD, mixed: FLP.  3) OSA with CPAP: noncompliance is a problem.  Has been back on  CPAP for about a week. He will call and arrange f/u with Dr. Craige CottaSood.  4) Hx of DVT and PE: on lifetime xarelto.  No sign of abnl bleeding.  CBC check--future.  5) CAD with hx of MI + stents: asymptomatic.  He takes plavix. CBC-future.  Continue current med regimen as per cardiologist.  He has obtained necessary full surgical clearance from cardiologist.  After surgery he can be started on 81mg  ASA and NOT restarted on plavix since his stent procedure was > 1 yr ago.  6) Prostate ca screen: PSA-future.  I discussed the assessment and treatment plan with the patient. The patient was provided an  opportunity to ask questions and all were answered. The patient agreed with the plan and demonstrated an understanding of the instructions.   The patient was advised to call back or seek an in-person evaluation if the symptoms worsen or if the condition fails to improve as anticipated.  F/u: 6 mo RCI  Signed:  Santiago BumpersPhil , MD           11/03/2018

## 2018-10-31 NOTE — Telephone Encounter (Signed)
   Primary Cardiologist: Jenkins Rouge, MD  Chart reviewed as part of pre-operative protocol coverage. Given past medical history and time since last visit, based on ACC/AHA guidelines, Xavier Richison Eatonwould be at acceptable risk for the planned procedure without further cardiovascular testing. He is easily getting > 55mets of activity without complication based on discussion with patient.   Per Dr. Johnsie Cancel, can stop Plavix and can hold Xarelto 4 days prior to procedure. Post procedure, can resume Xarelto when safe per surgeon and just resume ASA 81, as his stent was over a year ago.   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 Other hx includes CAD, CRF, HTN and DM.  I will route this recommendation to the requesting party via Epic fax function and remove from pre-op pool.  Please call with questions.  Kathyrn Drown, NP 10/31/2018, 3:23 PM

## 2018-11-01 ENCOUNTER — Other Ambulatory Visit: Payer: Self-pay

## 2018-11-01 DIAGNOSIS — E119 Type 2 diabetes mellitus without complications: Secondary | ICD-10-CM

## 2018-11-01 MED ORDER — METFORMIN HCL 500 MG PO TABS: ORAL_TABLET | ORAL | 3 refills | Status: DC

## 2018-11-03 ENCOUNTER — Encounter: Payer: Self-pay | Admitting: Family Medicine

## 2018-11-04 ENCOUNTER — Ambulatory Visit (INDEPENDENT_AMBULATORY_CARE_PROVIDER_SITE_OTHER): Payer: Medicare Other | Admitting: Family Medicine

## 2018-11-04 ENCOUNTER — Other Ambulatory Visit: Payer: Self-pay

## 2018-11-04 DIAGNOSIS — Z125 Encounter for screening for malignant neoplasm of prostate: Secondary | ICD-10-CM

## 2018-11-04 DIAGNOSIS — Z7901 Long term (current) use of anticoagulants: Secondary | ICD-10-CM | POA: Diagnosis not present

## 2018-11-04 DIAGNOSIS — E782 Mixed hyperlipidemia: Secondary | ICD-10-CM

## 2018-11-04 DIAGNOSIS — E7800 Pure hypercholesterolemia, unspecified: Secondary | ICD-10-CM | POA: Diagnosis not present

## 2018-11-04 LAB — LIPID PANEL
Cholesterol: 152 mg/dL (ref 0–200)
HDL: 49.5 mg/dL (ref >39.00)
NonHDL: 102.35
Total CHOL/HDL Ratio: 3
Triglycerides: 216 mg/dL — ABNORMAL HIGH (ref 0.0–149.0)
VLDL: 43.2 mg/dL — ABNORMAL HIGH (ref 0.0–40.0)

## 2018-11-04 LAB — CBC WITH DIFFERENTIAL/PLATELET
Basophils Absolute: 0.1 10*3/uL (ref 0.0–0.1)
Basophils Relative: 0.8 % (ref 0.0–3.0)
Eosinophils Absolute: 0.2 10*3/uL (ref 0.0–0.7)
Eosinophils Relative: 2.6 % (ref 0.0–5.0)
HCT: 45 % (ref 39.0–52.0)
Hemoglobin: 15 g/dL (ref 13.0–17.0)
Lymphocytes Relative: 28.5 % (ref 12.0–46.0)
Lymphs Abs: 2.4 10*3/uL (ref 0.7–4.0)
MCHC: 33.3 g/dL (ref 30.0–36.0)
MCV: 92.6 fl (ref 78.0–100.0)
Monocytes Absolute: 0.6 10*3/uL (ref 0.1–1.0)
Monocytes Relative: 7.4 % (ref 3.0–12.0)
Neutro Abs: 5.1 10*3/uL (ref 1.4–7.7)
Neutrophils Relative %: 60.7 % (ref 43.0–77.0)
Platelets: 427 10*3/uL — ABNORMAL HIGH (ref 150.0–400.0)
RBC: 4.86 Mil/uL (ref 4.22–5.81)
RDW: 13.2 % (ref 11.5–15.5)
WBC: 8.4 10*3/uL (ref 4.0–10.5)

## 2018-11-04 LAB — PSA, MEDICARE: PSA: 0.17 ng/ml (ref 0.10–4.00)

## 2018-11-04 LAB — LDL CHOLESTEROL, DIRECT: Direct LDL: 82 mg/dL

## 2018-11-05 ENCOUNTER — Other Ambulatory Visit: Payer: Self-pay | Admitting: Endocrinology

## 2018-11-06 ENCOUNTER — Telehealth: Payer: Self-pay | Admitting: Acute Care

## 2018-11-06 NOTE — Telephone Encounter (Signed)
Looked at pt's appt which is scheduled with SG tomorrow 6/25 at 9:30 for surgical clearance visit. Pt is needing to have COVID screen performed as this has not been done yet.  Attempted to call pt but unable to reach. Left message for pt to return call.

## 2018-11-06 NOTE — Telephone Encounter (Signed)
Pt returning call and can be reached @ [phone].Xavier [name]

## 2018-11-06 NOTE — Telephone Encounter (Signed)
Called and spoke with Patient.  Covid screen questions asked, negative.  Nothing further needed at this time.

## 2018-11-07 ENCOUNTER — Other Ambulatory Visit: Payer: Self-pay

## 2018-11-07 ENCOUNTER — Encounter: Payer: Self-pay | Admitting: Acute Care

## 2018-11-07 ENCOUNTER — Ambulatory Visit (INDEPENDENT_AMBULATORY_CARE_PROVIDER_SITE_OTHER): Payer: Medicare Other | Admitting: Acute Care

## 2018-11-07 VITALS — BP 118/80 | HR 75 | Temp 97.7°F | Ht 70.0 in | Wt 243.0 lb

## 2018-11-07 DIAGNOSIS — Z01811 Encounter for preprocedural respiratory examination: Secondary | ICD-10-CM | POA: Diagnosis not present

## 2018-11-07 DIAGNOSIS — Z9989 Dependence on other enabling machines and devices: Secondary | ICD-10-CM | POA: Diagnosis not present

## 2018-11-07 DIAGNOSIS — E782 Mixed hyperlipidemia: Secondary | ICD-10-CM

## 2018-11-07 DIAGNOSIS — G4733 Obstructive sleep apnea (adult) (pediatric): Secondary | ICD-10-CM | POA: Diagnosis not present

## 2018-11-07 MED ORDER — FENOFIBRATE 145 MG PO TABS: 145.0000 mg | ORAL_TABLET | Freq: Every day | ORAL | 3 refills | Status: DC

## 2018-11-07 NOTE — Progress Notes (Signed)
History of Present Illness Xavier AoBobby E [name] is a 53 y.o. male former smoker with OSA on CPAP and PE ( 2016) .  He is followed by Xavier [name].   11/07/2018 Surgical Clearance OV  Pt. Presents for surgical clearance for rotator cuff surgery. He states he resumed his CPAP therapy 2 weeks ago. He has not been compliant with his device with the exception of the last 2 weeks. . Per the last OV 11/26/2017 he had was not using his device. He has started using the machine to get surgical clearance. He states it is because of the difficulty to clean the device. When he is compliant with the device, he has great control. He states his wife told him he does not snore when he uses his device. He has no daytime sleepiness, and states he has more energy when wearing his CPAP. He states he does not wake up as much at night. He denies fever, chest pain, orthopnea or hemoptysis. He understands the increased risks he carries when he comes off his blood thinner for surgery and that he will need to resume his Xavier [name] per his surgical teams direction .   1) RISK FOR PROLONGED MECHANICAL VENTILAION - > 48h  1A) Arozullah - Prolonged mech ventilation risk Arozullah Postperative Pulmonary Risk Score - for mech ventilation dependence >48h USAA([employer] dataset, Ann Surg 2000, major non-cardiac surgery) Comment Score  Type of surgery - abd [name] aneurysm (27), thoracic (21), neurosurgery / upper abdominal / vascular (21), neck (11) Orthopedic 0  Emergency Surgery - (11) 0 0  ALbumin < 3 or poor nutritional state - (9) NA no labs   BUN > 30 -  (8) NA no labs 0  Partial or completely dependent functional status - (7) 0 0  COPD -  (6) 0 0  Age - 60 to 69 (4), > 70  (6) 0 0  TOTAL 0 0  Risk Stratifcation scores  - < 10 (0.5%), 11-19 (1.8%), 20-27 (4.2%), 28-40 (10.1%), >40 (26.6%) 0 0    Major Pulmonary risks identified in the multifactorial risk analysis are but not limited to a) pneumonia; b) recurrent intubation risk; c) prolonged  or recurrent acute respiratory failure needing mechanical ventilation; d) prolonged hospitalization; e) DVT/Pulmonary embolism; f) Acute Pulmonary edema  Recommend 1. Short duration of surgery as much as possible and avoid paralytic if possible 2. Recovery in step down or ICU with Pulmonary consultation as condition dictates per surgical team assessment. 3. CPAP device every night pre and post op in the hospital 4. Will need bridging with Lovenox and DVT prophylaxis with  transition back to Xavier [name] prior to discharge home per the surgical team. 5. Aggressive pulmonary toilet with O2, bronchodilatation, and incentive spirometry and early ambulation   I have explained to the patient that he has increased risk of clot when he comes off his anticoagulation for surgery. He verbalized understanding.  Test Results: Pulmonary tests: CT angio chest 11/25/13 >> PE RLL, fatty liver  Sleep tests: HST 07/19/15 >> 36.4, SaO2 low 80%  Cardiac tests: LHC 06/27/16 >> EF 55 to 65%, stent to LAD   CBC Latest Ref Rng & Units 11/04/2018 10/10/2017 07/12/2016  WBC 4.0 - 10.5 K/uL 8.4 8.9 7.0  Hemoglobin 13.0 - 17.0 g/dL 16.115.0 09.616.1 04.514.0  Hematocrit 39.0 - 52.0 % 45.0 46.7 39.6  Platelets 150.0 - 400.0 K/uL 427.0(H) 368.0 399(H)    BMP Latest Ref Rng & Units 09/19/2018 10/10/2017 07/12/2016  Glucose 70 - 99 mg/dL  131(H) 171(H) 157(H)  BUN 6 - 23 mg/dL 16 15 17   Creatinine 0.40 - 1.50 mg/dL 1.18 1.10 1.09  BUN/Creat Ratio 9 - 20 - - 16  Sodium 135 - 145 mEq/L 135 135 137  Potassium 3.5 - 5.1 mEq/L 4.7 4.8 4.7  Chloride 96 - 112 mEq/L 99 95(L) 96  CO2 19 - 32 mEq/L 27 29 24   Calcium 8.4 - 10.5 mg/dL 9.9 10.4 9.7    BNP    Component Value Date/Time   BNP 6.9 06/25/2016 1552    ProBNP    Component Value Date/Time   PROBNP 49.4 11/26/2013 0525    PFT No results found for: FEV1PRE, FEV1POST, FVCPRE, FVCPOST, TLC, DLCOUNC, PREFEV1FVCRT, PSTFEV1FVCRT  Mr Shoulder Right Wo Contrast  Result Date:  10/21/2018 CLINICAL DATA:  Right shoulder pain down the arm and into hand for the past 5-6 months. Weakness in the arms. EXAM: MRI OF THE RIGHT SHOULDER WITHOUT CONTRAST TECHNIQUE: Multiplanar, multisequence MR imaging of the shoulder was performed. No intravenous contrast was administered. COMPARISON:  None. FINDINGS: Rotator cuff: Moderate tendinosis of the supraspinatus tendon with fraying along the bursal surface and a tiny insertional interstitial tear. Mild tendinosis of the infraspinatus tendon. Teres minor tendon is intact. Subscapularis tendon is intact. Muscles: No atrophy or fatty replacement of nor abnormal signal within, the muscles of the rotator cuff. Biceps long head:  Intact. Acromioclavicular Joint: Moderate arthropathy of the acromioclavicular joint. Type I acromion. Trace subacromial/subdeltoid bursal fluid. Glenohumeral Joint: No joint effusion. No chondral defect. Labrum: Superior posterior labral tear extending into the remainder of the posterior labrum. Bones:  No acute osseous abnormality.  No aggressive osseous lesion. Other: No fluid collection or hematoma. IMPRESSION: 1. Moderate tendinosis of the supraspinatus tendon with fraying along the bursal surface and a tiny insertional interstitial tear. 2. Mild tendinosis of the infraspinatus tendon. 3. Moderate arthropathy of the acromioclavicular joint. 4. Superior posterior labral tear extending into the remainder of the posterior labrum. Electronically Signed   By: Kathreen Devoid   On: 10/21/2018 08:19     Past medical hx Past Medical History:  Diagnosis Date  . ANEMIA, PERNICIOUS 04/03/2007  . Anxiety   . Arthritis    "left hand" (06/26/2016)  . Chronic lower back pain   . Colon cancer screening 10/27/2016   Cologuard NEG-->rpt 3 yrs  . Complication of anesthesia    "w/hand OR; spouse had to go into PACU & nudge me q little bit; just wouldn't wake up"  . CORONARY ARTERY DISEASE 12/05/2006  . Depression   . DIABETES MELLITUS,  TYPE II dx'd 2001  . DVT (deep venous thrombosis) ([place]) 2016   BLE  . GERD (gastroesophageal reflux disease)   . History of blood transfusion 2001   "when I had my hand OR"  . History of gout   . History of kidney stones   . HYPERCHOLESTEROLEMIA 07/17/2007  . HYPERTENSION 12/05/2006  . Learning disability   . Migraine    "1-2/month" (06/26/2016)  . Myocardial infarction ([place]) ~ 2003  . OSA on CPAP    Pt cannot recall setting clearly but thinks it is 5 cm h20.    . PE (pulmonary embolism) 2016  . Stroke [facility]) ~ 2006   denies residual on 06/26/2016  . TIA (transient ischemic attack) ?06/25/2016  . VISUAL ACUITY, DECREASED, RIGHT EYE 02/19/2009     Social History   Tobacco Use  . Smoking status: Former Smoker    Packs/day: 0.10  Years: 3.00    Pack years: 0.30    Types: Cigarettes    Quit date: 05/15/1984    Years since quitting: 34.5  . Smokeless tobacco: Never Used  Substance Use Topics  . Alcohol use: No    Frequency: Never  . Drug use: No    Mr.Xavier [name] reports that he quit smoking about 34 years ago. His smoking use included cigarettes. He has a 0.30 pack-year smoking history. He has never used smokeless tobacco. He reports that he does not drink alcohol or use drugs.  Tobacco Cessation: Former smoker , quit 1986 with a 0.3 pack year smoking history  Past surgical hx, Family hx, Social hx all reviewed.  Current Outpatient Medications on File Prior to Visit  Medication Sig  . acetaminophen (TYLENOL) 500 MG tablet Take 1,000 mg by mouth every 6 (six) hours as needed.  . bromocriptine (PARLODEL) 2.5 MG tablet Take 1 tablet (2.5 mg total) by mouth daily. TAKE 1/4 TABLET BY MOUTH EVERY DAY  . clopidogrel (PLAVIX) 75 MG tablet TAKE 1 TABLET BY MOUTH EVERY DAY  . diazepam (VALIUM) 10 MG tablet Take 10 mg by mouth 3 (three) times daily.  . DULoxetine (CYMBALTA) 30 MG capsule Take 30 mg by mouth daily.    . empagliflozin (JARDIANCE) 25 MG TABS tablet Take 25 mg by mouth daily.   . fenofibrate (TRICOR) 48 MG tablet Take 1 tablet (48 mg total) by mouth daily. Needs appointment  . FLUoxetine (PROZAC) 40 MG capsule Take 40 mg by mouth daily.    . fluticasone (FLONASE) 50 MCG/ACT nasal spray USE ONE SPRAY IN EACH NOSTRIL TWICE A DAY  . hydrochlorothiazide (HYDRODIURIL) 12.5 MG tablet TAKE 1 TABLET BY MOUTH EVERY DAY WITH THE LOSARTAN  . losartan (COZAAR) 100 MG tablet Take 0.5 tablets (50 mg total) by mouth daily.  . metFORMIN (GLUCOPHAGE) 500 MG tablet Take 2 tablets by mouth twice daily  . nitroGLYCERIN (NITROSTAT) 0.4 MG SL tablet Place 1 tablet (0.4 mg total) under the tongue every 5 (five) minutes as needed for chest pain.  Marland Kitchen. nystatin cream (MYCOSTATIN) Apply to affected area 2 times daily  . ONETOUCH VERIO test strip USE TO CHECK BLOOD SUGAR 1 TIME PER DAY. DX CODE: E11.9  . OZEMPIC 1 MG/DOSE SOPN INJECT 1 MG INTO THE SKIN ONCE A WEEK.  . rosuvastatin (CRESTOR) 5 MG tablet Take 1 tablet (5 mg total) by mouth daily.  . traMADol (ULTRAM) 50 MG tablet Take 1-2 tabs at bedtime prn pain  . XARELTO 20 MG TABS tablet TAKE 1 TABLET BY MOUTH EVERY DAY BEFORE SUPPER   No current facility-administered medications on file prior to visit.      Allergies  Allergen Reactions  . Actos [Pioglitazone] Other (See Comments)    Potential cause of DVT  . Bee Venom Anaphylaxis  . Klonopin [Clonazepam] Anaphylaxis  . Invokana [Canagliflozin] Nausea Only    Nausea/dizzy/bad taste in mouth  . Niacin Other (See Comments)    "makes his crazy"    Review Of Systems:  Constitutional:   + intentional   weight loss, No night sweats,  Fevers, chills, fatigue, or  lassitude.  HEENT:   No headaches,  Difficulty swallowing,  Tooth/dental problems, or  Sore throat,                No sneezing, itching, ear ache, nasal congestion, post nasal drip,   CV:  No chest pain,  Orthopnea, PND, swelling in lower extremities, anasarca, dizziness, palpitations, syncope.  GI  No heartburn,  indigestion, abdominal pain, nausea, vomiting, diarrhea, change in bowel habits, loss of appetite, bloody stools.   Resp: No shortness of breath with exertion or at rest.  No excess mucus, no productive cough,  No non-productive cough,  No coughing up of blood.  No change in color of mucus.  No wheezing.  No chest wall deformity  Skin: no rash or lesions.  GU: no dysuria, change in color of urine, no urgency or frequency.  No flank pain, no hematuria   MS:  No joint pain or swelling.  No decreased range of motion.  No back pain.  Psych:  No change in mood or affect. No depression or anxiety.  No memory loss.   Vital Signs BP 118/80 (BP Location: Left Arm, Cuff Size: Large)   Pulse 75   Temp 97.7 F (36.5 C) (Oral)   Ht 5\' 10"  (1.778 m)   Wt 243 lb (110.2 kg)   SpO2 99%   BMI 34.87 kg/m    Physical Exam:  General- No distress,  A&Ox3, pleasant ENT: No sinus tenderness, TM clear, pale nasal mucosa, no oral exudate,no post nasal drip, no LAN Cardiac: S1, S2, regular rate and rhythm, no murmur Chest: No wheeze/ rales/ dullness; no accessory muscle use, no nasal flaring, no sternal retractions Abd.: Soft Non-tender, ND, BS +, Body mass index is 34.87 kg/m. Ext: No clubbing cyanosis, edema Neuro:  normal strength, MAE x 4, A&O x 3, appropriate Skin: No rashes,no lesions, warm and dry Psych: normal mood and behavior   Assessment/Plan  Surgical Clearance Rotator Cuff Surgery Clearance ok'd from a pulmonary standpoint only Recommend/ Plan 1. Short duration of surgery as much as possible and avoid paralytic if possible 2. Recovery in step down or ICU with Pulmonary consultation 3. CPAP device every night pre and post op in the hospital 4. Will need bridging with a short acting anticoagulation  and DVT prophylaxis with  transition back to [place] prior to discharge home per the surgical team 5. I have explained the increased risk of clot with discontinuation of blood thinner for  the surgery.Pt. verbalized understanding 6. Aggressive pulmonary toilet with O2, bronchodilatation, and incentive spirometry and early ambulation  OSA on CPAP Non-compliant with exception of last 9 days Whe he is compliant his OSA is well controlled. Plan Continue on CPAP at bedtime. You appear to be benefiting from the treatment  Goal is to wear for at least 4 hours each night for maximal clinical benefit. Continue to work on weight loss, as the link between excess weight  and sleep apnea is well established.   Remember to establish a good bedtime routine, and work on sleep hygiene.  Limit daytime naps , avoid stimulants such as caffeine and nicotine close to bedtime, exercise daily to promote sleep quality, avoid heavy , spicy, fried , or rich foods before bed. Ensure adequate exposure to natural light during the day,establish a relaxing bedtime routine with a pleasant sleep environment ( Bedroom between 60 and 67 degrees, turn off bright lights , TV or device screens screens , consider black out curtains or white noise machines) Do not drive if sleepy. Remember to clean mask, tubing, filter, and reservoir once weekly with soapy water.  Follow up with Xavier [name]   In 6 months or before as needed.     Bevelyn NgoSarah F Groce, NP 11/07/2018  9:02 AM

## 2018-11-07 NOTE — Patient Instructions (Signed)
It is good to see you today. We will clear you for surgery from a pulmonary perspective only. Major Pulmonary risks identified in the multifactorial risk analysis are but not limited to a) pneumonia; b) recurrent intubation risk; c) prolonged or recurrent acute respiratory failure needing mechanical ventilation; d) prolonged hospitalization; e) DVT/Pulmonary embolism; f) Acute Pulmonary edema  Recommend 1. Short duration of surgery as much as possible and avoid paralytic if possible 2. Recovery in step down or ICU with Pulmonary consultation 3. CPAP device every night pre and post op in the hospital 4. Will need bridging with a short acting anticoagulation  and DVT prophylaxis with  transition back to [place] prior to discharge home. 5. I have explained the increased risk of clot with discontinuation of blood thinner for the surgery.Pt. verbalized understanding 6. Aggressive pulmonary toilet with O2, bronchodilatation, and incentive spirometry and early ambulation    Continue on CPAP at bedtime. You appear to be benefiting from the treatment  Goal is to wear for at least 4 hours each night for maximal clinical benefit. Continue to work on weight loss, as the link between excess weight  and sleep apnea is well established.   Remember to establish a good bedtime routine, and work on sleep hygiene.  Limit daytime naps , avoid stimulants such as caffeine and nicotine close to bedtime, exercise daily to promote sleep quality, avoid heavy , spicy, fried , or rich foods before bed. Ensure adequate exposure to natural light during the day,establish a relaxing bedtime routine with a pleasant sleep environment ( Bedroom between 60 and 67 degrees, turn off bright lights , TV or device screens screens , consider black out curtains or white noise machines) Do not drive if sleepy. Remember to clean mask, tubing, filter, and reservoir once weekly with soapy water.  Follow up with Dr. Halford Chessman   In 6 months or  before as needed.

## 2018-11-12 ENCOUNTER — Other Ambulatory Visit: Payer: Self-pay

## 2018-11-14 ENCOUNTER — Other Ambulatory Visit: Payer: Self-pay

## 2018-11-14 ENCOUNTER — Ambulatory Visit (INDEPENDENT_AMBULATORY_CARE_PROVIDER_SITE_OTHER): Payer: Medicare Other | Admitting: Endocrinology

## 2018-11-14 ENCOUNTER — Encounter: Payer: Self-pay | Admitting: Endocrinology

## 2018-11-14 VITALS — BP 112/78 | HR 76 | Ht 70.0 in | Wt 242.0 lb

## 2018-11-14 DIAGNOSIS — E119 Type 2 diabetes mellitus without complications: Secondary | ICD-10-CM | POA: Diagnosis not present

## 2018-11-14 LAB — POCT GLYCOSYLATED HEMOGLOBIN (HGB A1C): Hemoglobin A1C: 6.8 % — AB (ref 4.0–5.6)

## 2018-11-14 NOTE — Patient Instructions (Addendum)
Please continue the same medications. It is good that you have lost weight.  Please continue your efforts. check your blood sugar once a day.  vary the time of day when you check, between before the 3 meals, and at bedtime.  also check if you have symptoms of your blood sugar being too high or too low.  please keep a record of the readings and bring it to your next appointment here (or you can bring the meter itself).  You can write it on any piece of paper.  please call us sooner if your blood sugar goes below 70, or if you have a lot of readings over 200.  Please come back for a follow-up appointment in 4-6 months

## 2018-11-14 NOTE — Progress Notes (Signed)
Subjective:    Patient ID: Xavier [name], male    DOB: 03-Jul-1965, 53 y.o.   MRN: [id number]  HPI Pt returns for f/u of diabetes mellitus:  DM type: 2 Dx'ed: 2005 Complications: mild CAD, and polyneuropathy.   Therapy: Ozempic and 3 oral meds.   DKA: never Severe hypoglycemia: never.   Pancreatitis: never.  Other: he did not tolerate pioglitizone (edema); he has never taken insulin, except in the hospital.  Interval history: no cbg record, but states cbg's are well-controlled.  pt states he feels well in general.   Past Medical History:  Diagnosis Date  . ANEMIA, PERNICIOUS 04/03/2007  . Anxiety   . Arthritis    "left hand" (06/26/2016)  . Chronic lower back pain   . Colon cancer screening 10/27/2016   Cologuard NEG-->rpt 3 yrs  . Complication of anesthesia    "w/hand OR; spouse had to go into PACU & nudge me q little bit; just wouldn't wake up"  . CORONARY ARTERY DISEASE 12/05/2006  . Depression   . DIABETES MELLITUS, TYPE II dx'd 2001  . DVT (deep venous thrombosis) ([facility]) 2016   BLE  . GERD (gastroesophageal reflux disease)   . History of blood transfusion 2001   "when I had my hand OR"  . History of gout   . History of kidney stones   . HYPERCHOLESTEROLEMIA 07/17/2007  . HYPERTENSION 12/05/2006  . Learning disability   . Migraine    "1-2/month" (06/26/2016)  . Myocardial infarction ([facility]) ~ 2003  . OSA on CPAP    Pt cannot recall setting clearly but thinks it is 5 cm h20.    . PE (pulmonary embolism) 2016  . Stroke [facility]) ~ 2006   denies residual on 06/26/2016  . TIA (transient ischemic attack) ?06/25/2016  . VISUAL ACUITY, DECREASED, RIGHT EYE 02/19/2009    Past Surgical History:  Procedure Laterality Date  . CARDIAC CATHETERIZATION  1990s; 2012; 06/2016   06/2016 distal LAD DES stent.  EF 55-65%  . CARDIOVASCULAR STRESS TEST  06/2016   Echo stress test->"inconclusive"-->cath was done after this  . CATARACT EXTRACTION W/ INTRAOCULAR LENS IMPLANT Right 2017  .  CORONARY STENT INTERVENTION N/A 06/27/2016   Procedure: Coronary Stent Intervention;  Surgeon: Marykay Lexavid W Harding, MD;  [facility];  Service: Cardiovascular;  Laterality: N/A;  . EYE SURGERY Right    Cataract  . INGUINAL HERNIA REPAIR Bilateral 1990s  . INTRAVASCULAR PRESSURE WIRE/FFR STUDY N/A 06/27/2016   Procedure: Intravascular Pressure Wire/FFR Study;  Surgeon: Marykay Lexavid W Harding, MD;  [facility];  Service: Cardiovascular;  Laterality: N/A;  . LEFT HEART CATH AND CORONARY ANGIOGRAPHY N/A 06/27/2016   Procedure: Left Heart Cath and Coronary Angiography;  Surgeon: Marykay Lexavid W Harding, MD;  [facility];  Service: Cardiovascular;  Laterality: N/A;  . REATTACHMENT HAND Left ~ 2001   w/carpal tunnel release  . UMBILICAL HERNIA REPAIR  1990s   w/[facility]    Social History   Socioeconomic History  . Marital status: Married    Spouse name: Not on file  . Number of children: Not on file  . Years of education: Not on file  . Highest education level: Not on file  Occupational History  . Occupation: Disabled    Employer: DISABLED  Social Needs  . Financial resource strain: Not on file  . Food insecurity    Worry: Not on file    Inability: Not on file  . Transportation needs  Medical: Not on file    Non-medical: Not on file  Tobacco Use  . Smoking status: Former Smoker    Packs/day: 0.10    Years: 3.00    Pack years: 0.30    Types: Cigarettes    Quit date: 05/15/1984    Years since quitting: 34.5  . Smokeless tobacco: Never Used  Substance and Sexual Activity  . Alcohol use: No    Frequency: Never  . Drug use: No  . Sexual activity: Not on file  Lifestyle  . Physical activity    Days per week: Not on file    Minutes per session: Not on file  . Stress: Not on file  Relationships  . Social Musicianconnections    Talks on phone: Not on file    Gets together: Not on file    Attends religious service: Not on file    Active member of club or  organization: Not on file    Attends meetings of clubs or organizations: Not on file    Relationship status: Not on file  . Intimate partner violence    Fear of current or ex partner: Not on file    Emotionally abused: Not on file    Physically abused: Not on file    Forced sexual activity: Not on file  Other Topics Concern  . Not on file  Social History Narrative   Married, has 2 sons, no grandchildren.   [place]   Disabled due to left wrist injury at work.   Tobacco: none   Alc: none    Current Outpatient Medications on File Prior to Visit  Medication Sig Dispense Refill  . acetaminophen (TYLENOL) 500 MG tablet Take 1,000 mg by mouth every 6 (six) hours as needed.    . bromocriptine (PARLODEL) 2.5 MG tablet Take 1 tablet (2.5 mg total) by mouth daily. TAKE 1/4 TABLET BY MOUTH EVERY DAY 45 tablet 3  . clopidogrel (PLAVIX) 75 MG tablet TAKE 1 TABLET BY MOUTH EVERY DAY 90 tablet 3  . diazepam (VALIUM) 10 MG tablet Take 10 mg by mouth 3 (three) times daily.    . DULoxetine (CYMBALTA) 30 MG capsule Take 30 mg by mouth daily.      . empagliflozin (JARDIANCE) 25 MG TABS tablet Take 25 mg by mouth daily. 90 tablet 3  . fenofibrate (TRICOR) 145 MG tablet Take 1 tablet (145 mg total) by mouth daily. 30 tablet 3  . FLUoxetine (PROZAC) 40 MG capsule Take 40 mg by mouth daily.      . fluticasone (FLONASE) 50 MCG/ACT nasal spray USE ONE SPRAY IN EACH NOSTRIL TWICE A DAY 16 g 2  . hydrochlorothiazide (HYDRODIURIL) 12.5 MG tablet TAKE 1 TABLET BY MOUTH EVERY DAY WITH THE LOSARTAN 90 tablet 1  . losartan (COZAAR) 100 MG tablet Take 0.5 tablets (50 mg total) by mouth daily. 45 tablet 1  . metFORMIN (GLUCOPHAGE) 500 MG tablet Take 2 tablets by mouth twice daily 180 tablet 3  . nitroGLYCERIN (NITROSTAT) 0.4 MG SL tablet Place 1 tablet (0.4 mg total) under the tongue every 5 (five) minutes as needed for chest pain. 10 tablet 0  . nystatin cream (MYCOSTATIN) Apply to  affected area 2 times daily 60 g 0  . ONETOUCH VERIO test strip USE TO CHECK BLOOD SUGAR 1 TIME PER DAY. DX CODE: E11.9 100 each 2  . OZEMPIC 1 MG/DOSE SOPN INJECT 1 MG INTO THE SKIN ONCE A WEEK. 12 pen 3  .  rosuvastatin (CRESTOR) 5 MG tablet Take 1 tablet (5 mg total) by mouth daily. 90 tablet 3  . traMADol (ULTRAM) 50 MG tablet Take 1-2 tabs at bedtime prn pain 14 tablet 1  . XARELTO 20 MG TABS tablet TAKE 1 TABLET BY MOUTH EVERY DAY BEFORE SUPPER 90 tablet 0   No current facility-administered medications on file prior to visit.     Allergies  Allergen Reactions  . Actos [Pioglitazone] Other (See Comments)    Potential cause of DVT  . Bee Venom Anaphylaxis  . Klonopin [Clonazepam] Anaphylaxis  . Invokana [Canagliflozin] Nausea Only    Nausea/dizzy/bad taste in mouth  . Niacin Other (See Comments)    "makes his crazy"    Family History  Problem Relation Age of Onset  . COPD Mother   . Lymphoma Father     BP 112/78 (BP Location: Left Arm, Patient Position: Sitting, Cuff Size: Large)   Pulse 76   Ht 5\' 10"  (1.778 m)   Wt 242 lb (109.8 kg)   SpO2 95%   BMI 34.72 kg/m    Review of Systems He has lost 8 lbs since last ov.      Objective:   Physical Exam VITAL SIGNS:  See vs page GENERAL: no distress Pulses: dorsalis pedis intact bilat.   MSK: no deformity of the feet CV: no leg edema Skin:  no ulcer on the feet.  normal color and temp on the feet. Neuro: sensation is intact to touch on the feet  Lab Results  Component Value Date   HGBA1C 6.8 (A) 11/14/2018       Assessment & Plan:  Type 2 DM, with CAD: well-controlled Weight loss: I advised pt to continue his efforts  Patient Instructions  Please continue the same medications. It is good that you have lost weight.  Please continue your efforts. check your blood sugar once a day.  vary the time of day when you check, between before the 3 meals, and at bedtime.  also check if you have symptoms of your blood  sugar being too high or too low.  please keep a record of the readings and bring it to your next appointment here (or you can bring the meter itself).  You can write it on any piece of paper.  please call us sooner if your blood sugar goes below 70, or if you have a lot of readings over 200.  Please come back for a follow-up appointment in 4-6 months

## 2018-11-18 ENCOUNTER — Other Ambulatory Visit: Payer: Self-pay | Admitting: Endocrinology

## 2018-11-19 ENCOUNTER — Other Ambulatory Visit: Payer: Self-pay

## 2018-12-01 ENCOUNTER — Other Ambulatory Visit: Payer: Self-pay | Admitting: Endocrinology

## 2018-12-01 NOTE — Telephone Encounter (Signed)
Please forward refill request to pt's new primary care provider.  

## 2018-12-02 NOTE — Telephone Encounter (Signed)
I can RF the rosuvastatin, but I won't RF the fluconazole.  Pls ask pt why he is requesting fluconazole rF (what dx has it been used to treat in the past??)

## 2018-12-02 NOTE — Telephone Encounter (Signed)
At Dr. Ellison's request, I am forwarding you this patient's refill request. Please review and refill if you deem appropriate. 

## 2018-12-10 ENCOUNTER — Other Ambulatory Visit: Payer: Self-pay | Admitting: Cardiovascular Disease

## 2018-12-13 ENCOUNTER — Telehealth: Payer: Self-pay | Admitting: Family Medicine

## 2018-12-13 NOTE — Telephone Encounter (Signed)
Xavier [name] from [name] to report that she will be faxing a requisition is for cardio vascular genetics test [id number] per patient  CB- [phone]

## 2018-12-17 NOTE — Telephone Encounter (Signed)
Signed and put in box to go up front.  

## 2018-12-17 NOTE — Telephone Encounter (Signed)
Received requisition for genetic testing. PCP will review and sign, if appropriate.

## 2018-12-23 DIAGNOSIS — I10 Essential (primary) hypertension: Secondary | ICD-10-CM | POA: Diagnosis not present

## 2018-12-23 DIAGNOSIS — Z8249 Family history of ischemic heart disease and other diseases of the circulatory system: Secondary | ICD-10-CM | POA: Diagnosis not present

## 2018-12-25 ENCOUNTER — Telehealth: Payer: Self-pay | Admitting: Family Medicine

## 2018-12-25 NOTE — Telephone Encounter (Signed)
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 He said the employee tested positive for COVID-19.   He wants to get tested. Told him that we would have set up virtual appt, [place] give order to get COVID-19 test. He declined appt. He said [facility]

## 2018-12-30 ENCOUNTER — Other Ambulatory Visit: Payer: Self-pay | Admitting: Pulmonary Disease

## 2019-01-02 ENCOUNTER — Other Ambulatory Visit: Payer: Self-pay | Admitting: Family Medicine

## 2019-01-03 NOTE — Telephone Encounter (Signed)
RF request for: Losartan 100mg  RF request for: Hydrochlorothiazide 12.5mg   LOV: 10/31/18 Next ov: not scheduled. Supposed to follow up in 6 months  Last written: patient is a new established patient. Old pcp prescribed these medications. Are you okay with filling them. I called and lvm to schedule a 6 month follow up.  Thank you

## 2019-01-12 ENCOUNTER — Other Ambulatory Visit: Payer: Self-pay | Admitting: Endocrinology

## 2019-01-17 ENCOUNTER — Encounter: Payer: Self-pay | Admitting: Family Medicine

## 2019-01-17 ENCOUNTER — Ambulatory Visit (INDEPENDENT_AMBULATORY_CARE_PROVIDER_SITE_OTHER): Payer: Medicare Other | Admitting: Family Medicine

## 2019-01-17 ENCOUNTER — Other Ambulatory Visit: Payer: Self-pay

## 2019-01-17 ENCOUNTER — Other Ambulatory Visit (INDEPENDENT_AMBULATORY_CARE_PROVIDER_SITE_OTHER): Payer: Medicare Other

## 2019-01-17 VITALS — BP 130/87 | HR 80 | Temp 98.3°F | Resp 16 | Ht 70.0 in | Wt 245.6 lb

## 2019-01-17 DIAGNOSIS — I2583 Coronary atherosclerosis due to lipid rich plaque: Secondary | ICD-10-CM

## 2019-01-17 DIAGNOSIS — Z86711 Personal history of pulmonary embolism: Secondary | ICD-10-CM

## 2019-01-17 DIAGNOSIS — E7800 Pure hypercholesterolemia, unspecified: Secondary | ICD-10-CM | POA: Diagnosis not present

## 2019-01-17 DIAGNOSIS — I2510 Atherosclerotic heart disease of native coronary artery without angina pectoris: Secondary | ICD-10-CM | POA: Diagnosis not present

## 2019-01-17 DIAGNOSIS — Z23 Encounter for immunization: Secondary | ICD-10-CM | POA: Diagnosis not present

## 2019-01-17 DIAGNOSIS — Z86718 Personal history of other venous thrombosis and embolism: Secondary | ICD-10-CM

## 2019-01-17 DIAGNOSIS — I10 Essential (primary) hypertension: Secondary | ICD-10-CM

## 2019-01-17 MED ORDER — TETANUS-DIPHTH-ACELL PERTUSSIS 5-2-15.5 LF-MCG/0.5 IM SUSP: 0.5000 mL | Freq: Once | INTRAMUSCULAR | 0 refills | Status: AC

## 2019-01-17 NOTE — Progress Notes (Signed)
OFFICE VISIT  01/17/2019   CC:  Chief Complaint  Patient presents with  . Follow-up    RCI, pt is fasting   HPI:    Patient is a 53 y.o. 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 He has CAD with hx of MI + stents, preserved LV function (plavix qd)--Dr. Eden EmmsNishan. He has DM and is followed by Dr. Everardo AllEllison for this.  Interim hx: Doing great, no acute complaints. He is active, gets HR and RR up regularly and has no CP or unusual SOB. No cough, fevers, or Sob. No signs of bleeding.   He is taking all meds as rx'd and has no side effects. He is using his CPAP consistently.  ROS: no CP, no SOB, no wheezing, no cough, no dizziness, no HAs, no rashes, no melena/hematochezia.  No polyuria or polydipsia.  No myalgias or arthralgias.  Past Medical History:  Diagnosis Date  . ANEMIA, PERNICIOUS 04/03/2007  . Anxiety   . Arthritis    "left hand" (06/26/2016)  . Chronic lower back pain   . Colon cancer screening 10/27/2016   Cologuard NEG-->rpt 3 yrs  . Complication of anesthesia    "w/hand OR; spouse had to go into PACU & nudge me q little bit; just wouldn't wake up"  . CORONARY ARTERY DISEASE 12/05/2006  . Depression   . DIABETES MELLITUS, TYPE II dx'd 2001  . DVT (deep venous thrombosis) (HCC) 2016   BLE  . GERD (gastroesophageal reflux disease)   . History of blood transfusion 2001   "when I had my hand OR"  . History of gout   . History of kidney stones   . HYPERCHOLESTEROLEMIA 07/17/2007  . HYPERTENSION 12/05/2006  . Learning disability   . Migraine    "1-2/month" (06/26/2016)  . Myocardial infarction (HCC) ~ 2003  . OSA on CPAP    Pt cannot recall setting clearly but thinks it is 5 cm h20.    . PE (pulmonary embolism) 2016  . Stroke [facility](HCC) ~ 2006   denies residual on 06/26/2016  . TIA (transient ischemic attack) ?06/25/2016  . VISUAL ACUITY, DECREASED, RIGHT EYE 02/19/2009    Past Surgical History:  Procedure Laterality  Date  . CARDIAC CATHETERIZATION  1990s; 2012; 06/2016   06/2016 distal LAD DES stent.  EF 55-65%  . CARDIOVASCULAR STRESS TEST  06/2016   Echo stress test->"inconclusive"-->cath was done after this  . CATARACT EXTRACTION W/ INTRAOCULAR LENS IMPLANT Right 2017  . CORONARY STENT INTERVENTION N/A 06/27/2016   Procedure: Coronary Stent Intervention;  Surgeon: Marykay Lexavid W Harding, MD;  [facility];  Service: Cardiovascular;  Laterality: N/A;  . EYE SURGERY Right    Cataract  . INGUINAL HERNIA REPAIR Bilateral 1990s  . INTRAVASCULAR PRESSURE WIRE/FFR STUDY N/A 06/27/2016   Procedure: Intravascular Pressure Wire/FFR Study;  Surgeon: Marykay Lexavid W Harding, MD;  [facility];  Service: Cardiovascular;  Laterality: N/A;  . LEFT HEART CATH AND CORONARY ANGIOGRAPHY N/A 06/27/2016   Procedure: Left Heart Cath and Coronary Angiography;  Surgeon: Marykay Lexavid W Harding, MD;  [facility];  Service: Cardiovascular;  Laterality: N/A;  . REATTACHMENT HAND Left ~ 2001   w/carpal tunnel release  . UMBILICAL HERNIA REPAIR  1990s   w/IHR    Outpatient Medications Prior to Visit  Medication Sig Dispense Refill  . acetaminophen (TYLENOL) 500 MG tablet Take 1,000 mg by mouth every 6 (six) hours as needed.    .Marland Kitchen  bromocriptine (PARLODEL) 2.5 MG tablet Take 1 tablet (2.5 mg total) by mouth daily. TAKE 1/4 TABLET BY MOUTH EVERY DAY 45 tablet 3  . clopidogrel (PLAVIX) 75 MG tablet TAKE 1 TABLET BY MOUTH EVERY DAY 90 tablet 0  . diazepam (VALIUM) 10 MG tablet Take 10 mg by mouth 3 (three) times daily.    . DULoxetine (CYMBALTA) 30 MG capsule Take 30 mg by mouth daily.      . empagliflozin (JARDIANCE) 25 MG TABS tablet Take 25 mg by mouth daily. 90 tablet 3  . fenofibrate (TRICOR) 145 MG tablet Take 1 tablet (145 mg total) by mouth daily. 30 tablet 3  . fluconazole (DIFLUCAN) 100 MG tablet TAKE 1 TABLET BY MOUTH EVERY DAY 5 tablet 5  . FLUoxetine (PROZAC) 40 MG capsule Take 40 mg by mouth  daily.      . fluticasone (FLONASE) 50 MCG/ACT nasal spray USE ONE SPRAY IN EACH NOSTRIL TWICE A DAY 16 g 2  . hydrochlorothiazide (HYDRODIURIL) 12.5 MG tablet TAKE 1 TABLET BY MOUTH EVERY DAY WITH THE LOSARTAN 90 tablet 1  . losartan (COZAAR) 100 MG tablet TAKE 1/2 TABLET BY MOUTH DAILY 45 tablet 1  . metFORMIN (GLUCOPHAGE) 500 MG tablet Take 2 tablets by mouth twice daily 180 tablet 3  . nitroGLYCERIN (NITROSTAT) 0.4 MG SL tablet Place 1 tablet (0.4 mg total) under the tongue every 5 (five) minutes as needed for chest pain. 10 tablet 0  . nystatin cream (MYCOSTATIN) Apply to affected area 2 times daily 60 g 0  . ONETOUCH VERIO test strip USE TO CHECK BLOOD SUGAR 1 TIME PER DAY. DX CODE: E11.9 100 each 2  . OZEMPIC, 1 MG/DOSE, 2 MG/1.5ML SOPN INJECT 1 MG INTO THE SKIN ONCE A WEEK. 9 pen 3  . rosuvastatin (CRESTOR) 5 MG tablet TAKE 1 TABLET BY MOUTH EVERY DAY 90 tablet 1  . traMADol (ULTRAM) 50 MG tablet Take 1-2 tabs at bedtime prn pain 14 tablet 1  . XARELTO 20 MG TABS tablet TAKE 1 TABLET BY MOUTH EVERY DAY BEFORE SUPPER 90 tablet 1   No facility-administered medications prior to visit.     Allergies  Allergen Reactions  . Actos [Pioglitazone] Other (See Comments)    Potential cause of DVT  . Bee Venom Anaphylaxis  . Klonopin [Clonazepam] Anaphylaxis  . Invokana [Canagliflozin] Nausea Only    Nausea/dizzy/bad taste in mouth  . Niacin Other (See Comments)    "makes his crazy"    PE: Blood pressure 130/87, pulse 80, temperature 98.3 F (36.8 C), temperature source Temporal, resp. rate 16, height 5\' 10"  (1.778 m), weight 245 lb 9.6 oz (111.4 kg), SpO2 96 %. Body mass index is 35.24 kg/m.  Gen: Alert, well appearing.  Patient is oriented to person, place, time, and situation. AFFECT: pleasant, lucid thought and speech. No further exam today.  LABS:  Lab Results  Component Value Date   TSH 2.73 10/10/2017   Lab Results  Component Value Date   WBC 8.4 11/04/2018   HGB 15.0  11/04/2018   HCT 45.0 11/04/2018   MCV 92.6 11/04/2018   PLT 427.0 (H) 11/04/2018   Lab Results  Component Value Date   CREATININE 1.18 09/19/2018   BUN 16 09/19/2018   NA 135 09/19/2018   K 4.7 09/19/2018   CL 99 09/19/2018   CO2 27 09/19/2018   Lab Results  Component Value Date   ALT 36 10/10/2017   AST 23 10/10/2017   ALKPHOS 70 10/10/2017  BILITOT 0.8 10/10/2017   Lab Results  Component Value Date   CHOL 152 11/04/2018   Lab Results  Component Value Date   HDL 49.50 11/04/2018   Lab Results  Component Value Date   LDLCALC (H) 06/11/2010    120        Total Cholesterol/HDL:CHD Risk Coronary Heart Disease Risk Table                     Men   Women  1/2 Average Risk   3.4   3.3  Average Risk       5.0   4.4  2 X Average Risk   9.6   7.1  3 X Average Risk  23.4   11.0        Use the calculated Patient Ratio above and the CHD Risk Table to determine the patient's CHD Risk.        ATP III CLASSIFICATION (LDL):  <100     mg/dL   Optimal  670-141  mg/dL   Near or Above                    Optimal  130-159  mg/dL   Borderline  030-131  mg/dL   High  >438     mg/dL   Very High   Lab Results  Component Value Date   TRIG 216.0 (H) 11/04/2018   Lab Results  Component Value Date   CHOLHDL 3 11/04/2018   Lab Results  Component Value Date   PSA 0.17 11/04/2018   PSA 0.26 10/10/2017   PSA 0.13 09/12/2016   Lab Results  Component Value Date   HGBA1C 6.8 (A) 11/14/2018   Lab Results  Component Value Date   PSA 0.17 11/04/2018   PSA 0.26 10/10/2017   PSA 0.13 09/12/2016    IMPRESSION AND PLAN:  1) HTN: The current medical regimen is effective;  continue present plan and medications. Lytes/cr today.  2) HLD: tolerating statin.  Continue fenofibrate and crestor. Hepatic panel today.  3) Hx of PE and DVT->xarelto indefinitely.  No sign of bleeding.  4) CAD: hx of stent.  Continue plavix per cardiologist's orders.  5) Preventative health: flu  vaccine today.  Tdap rx sent to pharmacy.  An After Visit Summary was printed and given to the patient.  FOLLOW UP: Return in about 6 months (around 07/17/2019) for routine chronic illness f/u.  Signed:  Santiago Bumpers, MD           01/17/2019

## 2019-01-18 LAB — COMPREHENSIVE METABOLIC PANEL
AG Ratio: 1.7 (calc) (ref 1.0–2.5)
ALT: 17 U/L (ref 9–46)
AST: 20 U/L (ref 10–35)
Albumin: 4.7 g/dL (ref 3.6–5.1)
Alkaline phosphatase (APISO): 38 U/L (ref 35–144)
BUN: 15 mg/dL (ref 7–25)
CO2: 23 mmol/L (ref 20–32)
Calcium: 10 mg/dL (ref 8.6–10.3)
Chloride: 100 mmol/L (ref 98–110)
Creat: 1.24 mg/dL (ref 0.70–1.33)
Globulin: 2.7 g/dL (calc) (ref 1.9–3.7)
Glucose, Bld: 105 mg/dL — ABNORMAL HIGH (ref 65–99)
Potassium: 4.3 mmol/L (ref 3.5–5.3)
Sodium: 135 mmol/L (ref 135–146)
Total Bilirubin: 0.5 mg/dL (ref 0.2–1.2)
Total Protein: 7.4 g/dL (ref 6.1–8.1)

## 2019-01-21 ENCOUNTER — Ambulatory Visit: Payer: Medicare Other | Admitting: Endocrinology

## 2019-02-07 ENCOUNTER — Other Ambulatory Visit: Payer: Self-pay | Admitting: Family Medicine

## 2019-02-10 ENCOUNTER — Other Ambulatory Visit: Payer: Self-pay | Admitting: Family Medicine

## 2019-02-22 NOTE — Progress Notes (Signed)
Virtual Visit via Telephone Note   This visit type was conducted due to national recommendations for restrictions regarding the COVID-19 Pandemic (e.g. social distancing) in an effort to limit this patient's exposure and mitigate transmission in our community.  Due to his co-morbid illnesses, this patient is at least at moderate risk for complications without adequate follow up.  This format is felt to be most appropriate for this patient at this time.  The patient did not have access to video technology/had technical difficulties with video requiring transitioning to audio format only (telephone).  All issues noted in this document were discussed and addressed.  No physical exam could be performed with this format.  Please refer to the patient's chart for his  consent to telehealth for [facility].   Date:  02/24/2019   ID:  Xavier [name], DOB [birth year], MRN [id number]  Patient Location: Home Provider Location: Home  PCP:  Xavier Massed, MD  Cardiologist:  Xavier Haws, MD   Evaluation Performed:  Follow-Up Visit  Chief Complaint:  1 year follow up, seen for Dr. Eden Emms   History of Present Illness:    Xavier [name] is a 53 y.o. 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   He was last seen by Dr. Eden Emms 12/19/2017 and was doing well from a cardiac perspective. He was noted to like fishing and [place] shooting.   ASA was stopped secondary to the need for chronic anticoagulation.   Today [name] has no complaints.  He has been doing well from a cardiac perspective.  Is having some shoulder issues and has an expected shoulder surgery coming up however with Covid hospital situation, plans were to hold off for a little longer.  Has been exercising a little more and losing some weight.  Reports feeling better.  He has been eating less, snacks on carrots instead of junk food.  Has canceled his normal  fishing trip secondary to shoulder issues.  Continues to [place] shoot.  Denies recurrent anginal symptoms, no shortness of breath, PND, diaphoresis, dizziness or syncope.  Overall doing very well.  The patient does not have symptoms concerning for COVID-19 infection (fever, chills, cough, or new shortness of breath).   Past Medical History:  Diagnosis Date  . ANEMIA, PERNICIOUS 04/03/2007  . Anxiety   . Arthritis    "left hand" (06/26/2016)  . Chronic lower back pain   . Colon cancer screening 10/27/2016   Cologuard NEG-->rpt 3 yrs  . Complication of anesthesia    "w/hand OR; spouse had to go into PACU & nudge me q little bit; just wouldn't wake up"  . CORONARY ARTERY DISEASE 12/05/2006  . Depression   . DIABETES MELLITUS, TYPE II dx'd 2001  . DVT (deep venous thrombosis) (HCC) 2016   BLE  . GERD (gastroesophageal reflux disease)   . History of blood transfusion 2001   "when I had my hand OR"  . History of gout   . History of kidney stones   . HYPERCHOLESTEROLEMIA 07/17/2007  . HYPERTENSION 12/05/2006  . Learning disability   . Migraine    "1-2/month" (06/26/2016)  . Myocardial infarction (HCC) ~ 2003  . Obesity, Class II, BMI 35-39.9   . OSA on CPAP    Pt cannot recall setting clearly but thinks it is 5 cm h20.    . PE (pulmonary embolism) 2016  . Stroke [facility]) ~ 2006  denies residual on 06/26/2016  . TIA (transient ischemic attack) ?06/25/2016  . VISUAL ACUITY, DECREASED, RIGHT EYE 02/19/2009   Past Surgical History:  Procedure Laterality Date  . CARDIAC CATHETERIZATION  1990s; 2012; 06/2016   06/2016 distal LAD DES stent.  EF 55-65%  . CARDIOVASCULAR STRESS TEST  06/2016   Echo stress test->"inconclusive"-->cath was done after this  . CATARACT EXTRACTION W/ INTRAOCULAR LENS IMPLANT Right 2017  . CORONARY STENT INTERVENTION N/A 06/27/2016   Procedure: Coronary Stent Intervention;  Surgeon: Marykay Lexavid W Harding, MD;  [facility];  Service: Cardiovascular;  Laterality:  N/A;  . EYE SURGERY Right    Cataract  . INGUINAL HERNIA REPAIR Bilateral 1990s  . INTRAVASCULAR PRESSURE WIRE/FFR STUDY N/A 06/27/2016   Procedure: Intravascular Pressure Wire/FFR Study;  Surgeon: Marykay Lexavid W Harding, MD;  [facility];  Service: Cardiovascular;  Laterality: N/A;  . LEFT HEART CATH AND CORONARY ANGIOGRAPHY N/A 06/27/2016   Procedure: Left Heart Cath and Coronary Angiography;  Surgeon: Marykay Lexavid W Harding, MD;  [facility];  Service: Cardiovascular;  Laterality: N/A;  . REATTACHMENT HAND Left ~ 2001   w/carpal tunnel release  . UMBILICAL HERNIA REPAIR  1990s   w/IHR     Current Meds  Medication Sig  . acetaminophen (TYLENOL) 500 MG tablet Take 1,000 mg by mouth every 6 (six) hours as needed.  . bromocriptine (PARLODEL) 2.5 MG tablet Take 1 tablet (2.5 mg total) by mouth daily. TAKE 1/4 TABLET BY MOUTH EVERY DAY  . clopidogrel (PLAVIX) 75 MG tablet TAKE 1 TABLET BY MOUTH EVERY DAY  . diazepam (VALIUM) 10 MG tablet Take 10 mg by mouth 3 (three) times daily.  . DULoxetine (CYMBALTA) 30 MG capsule Take 30 mg by mouth daily.    . empagliflozin (JARDIANCE) 25 MG TABS tablet Take 25 mg by mouth daily.  . fenofibrate (TRICOR) 145 MG tablet TAKE 1 TABLET BY MOUTH EVERY DAY  . fluconazole (DIFLUCAN) 100 MG tablet TAKE 1 TABLET BY MOUTH EVERY DAY  . FLUoxetine (PROZAC) 40 MG capsule Take 40 mg by mouth daily.    . fluticasone (FLONASE) 50 MCG/ACT nasal spray USE ONE SPRAY IN EACH NOSTRIL TWICE A DAY  . hydrochlorothiazide (HYDRODIURIL) 12.5 MG tablet TAKE 1 TABLET BY MOUTH EVERY DAY WITH THE LOSARTAN  . losartan (COZAAR) 100 MG tablet TAKE 1/2 TABLET BY MOUTH DAILY  . metFORMIN (GLUCOPHAGE) 500 MG tablet Take 2 tablets by mouth twice daily  . nitroGLYCERIN (NITROSTAT) 0.4 MG SL tablet Place 1 tablet (0.4 mg total) under the tongue every 5 (five) minutes as needed for chest pain.  Marland Kitchen. nystatin cream (MYCOSTATIN) Apply to affected area 2 times daily  . ONETOUCH  VERIO test strip USE TO CHECK BLOOD SUGAR 1 TIME PER DAY. DX CODE: E11.9  . OZEMPIC, 1 MG/DOSE, 2 MG/1.5ML SOPN INJECT 1 MG INTO THE SKIN ONCE A WEEK.  . rosuvastatin (CRESTOR) 5 MG tablet TAKE 1 TABLET BY MOUTH EVERY DAY  . traMADol (ULTRAM) 50 MG tablet Take 1-2 tabs at bedtime prn pain  . XARELTO 20 MG TABS tablet TAKE 1 TABLET BY MOUTH EVERY DAY BEFORE SUPPER     Allergies:   Actos [pioglitazone], Bee venom, Klonopin [clonazepam], Invokana [canagliflozin], and Niacin   Social History   Tobacco Use  . Smoking status: Former Smoker    Packs/day: 0.10    Years: 3.00    Pack years: 0.30    Types: Cigarettes    Quit date:  05/15/1984    Years since quitting: 34.8  . Smokeless tobacco: Never Used  Substance Use Topics  . Alcohol use: No    Frequency: Never  . Drug use: No     Family Hx: The patient's family history includes COPD in his mother; Lymphoma in his father.  ROS:   Please see the history of present illness.     All other systems reviewed and are negative.  Prior CV studies:   The following studies were reviewed today:  Cardiac Cath Conclusion 06/2016     The left ventricular systolic function is normal. The left ventricular ejection fraction is 55-65% by visual estimate.  LV end diastolic pressure is normal.  ________Pertinent Coronary Anatomy Findings__________  Dist LAD-1 lesion, 70 %stenosed. FFR 0.77 (Physiologically Significant)  A STENT SYNERGY DES 2.25X12 drug eluting stent was successfully placed (post-dilated to 2.5 mm)  Post intervention, there is a 0% residual stenosis.   Successful FFR guided PCI of distal LAD focal 70-75% lesion with DES stent.  Otherwise no sniffing CAD noted.  Plan:  Return to nursing unit for ongoing care and TR band removal.  Would continue aspirin plus Brilinta for minimum of 3 months. After that would be okay to stop aspirin if necessary.  Continue risk factor modification by Cardiology Consultation  Team  He Would Be Okay Discharge from a Cardiac Standpoint/Post PCI on February 14.     Labs/Other Tests and Data Reviewed:    EKG:  An ECG dated 12/19/2017 was personally reviewed today and demonstrated:  NSR  Recent Labs: 11/04/2018: Hemoglobin 15.0; Platelets 427.0 01/17/2019: ALT 17; BUN 15; Creat 1.24; Potassium 4.3; Sodium 135   Recent Lipid Panel Lab Results  Component Value Date/Time   CHOL 152 11/04/2018 09:36 AM   TRIG 216.0 (H) 11/04/2018 09:36 AM   HDL 49.50 11/04/2018 09:36 AM   CHOLHDL 3 11/04/2018 09:36 AM   LDLCALC (H) 06/11/2010 03:30 AM    120        Total Cholesterol/HDL:CHD Risk Coronary Heart Disease Risk Table                     Men   Women  1/2 Average Risk   3.4   3.3  Average Risk       5.0   4.4  2 X Average Risk   9.6   7.1  3 X Average Risk  23.4   11.0        Use the calculated Patient Ratio above and the CHD Risk Table to determine the patient's CHD Risk.        ATP III CLASSIFICATION (LDL):  <100     mg/dL   Optimal  100-129  mg/dL   Near or Above                    Optimal  130-159  mg/dL   Borderline  160-189  mg/dL   High  >190     mg/dL   Very High   LDLDIRECT 82.0 11/04/2018 09:36 AM    Wt Readings from Last 3 Encounters:  02/24/19 242 lb (109.8 kg)  01/17/19 245 lb 9.6 oz (111.4 kg)  11/14/18 242 lb (109.8 kg)     Objective:    Vital Signs:  Ht 5\' 10"  (1.778 m)   Wt 242 lb (109.8 kg)   BMI 34.72 kg/m    VITAL SIGNS:  reviewed GEN:  no acute distress RESPIRATORY:  normal respiratory effort NEURO:  alert and oriented x 3 PSYCH:  normal affect  ASSESSMENT & PLAN:    1. CAD s/p PCI to LAD 2018: -Stable with no anginal complaints -No ASA in the setting of AC for recurrent DVT/PE -Continue Plavix, statin  2. HTN: -Reports BP has been stable at most recent PCP office visit -Continue current regimen   3. HLD: -Last LDL, 82 -May need to increase? -Trig, 216 -Started on fenofibrate per PCP -Discussed diet and  exercise modifications to help with HLD reduction>>pt understands and agrees  -Continue current regimen   4. DVT/PE: -On chronic Xarelto with no c/o of acute bleeding in stool or urine  5. DM2: -On Jardiance  -Last Hb A1c, 6.8 on 11/14/2018 -Followed by PCP   6. Mild renal insufficiency: -Creatinine, 1.24 on 01/17/2019>>resolved  -Baseline in the 1.0 range    COVID-19 Education: The signs and symptoms of COVID-19 were discussed with the patient and how to seek care for testing (follow up with PCP or arrange E-visit).  The importance of social distancing was discussed today.  Time:   Today, I have spent 15 minutes with the patient with telehealth technology discussing the above problems.    Medication Adjustments/Labs and Tests Ordered: Current medicines are reviewed at length with the patient today.  Concerns regarding medicines are outlined above.   Tests Ordered: No orders of the defined types were placed in this encounter.   Medication Changes: No orders of the defined types were placed in this encounter.   Follow Up:  Either In Person or Virtual Visit With Dr. Eden Emms in 1 year   Signed, Georgie Chard, NP  02/24/2019 11:03 AM    [facility]

## 2019-02-24 ENCOUNTER — Encounter: Payer: Self-pay | Admitting: Cardiology

## 2019-02-24 ENCOUNTER — Telehealth (INDEPENDENT_AMBULATORY_CARE_PROVIDER_SITE_OTHER): Payer: Medicare Other | Admitting: Cardiology

## 2019-02-24 ENCOUNTER — Other Ambulatory Visit: Payer: Self-pay

## 2019-02-24 VITALS — Ht 70.0 in | Wt 242.0 lb

## 2019-02-24 DIAGNOSIS — I10 Essential (primary) hypertension: Secondary | ICD-10-CM

## 2019-02-24 DIAGNOSIS — E785 Hyperlipidemia, unspecified: Secondary | ICD-10-CM | POA: Diagnosis not present

## 2019-02-24 DIAGNOSIS — I2510 Atherosclerotic heart disease of native coronary artery without angina pectoris: Secondary | ICD-10-CM | POA: Diagnosis not present

## 2019-02-24 DIAGNOSIS — Z86718 Personal history of other venous thrombosis and embolism: Secondary | ICD-10-CM

## 2019-02-24 DIAGNOSIS — I2699 Other pulmonary embolism without acute cor pulmonale: Secondary | ICD-10-CM

## 2019-02-24 NOTE — Patient Instructions (Signed)
Medication Instructions:  Your physician recommends that you continue on your current medications as directed. Please refer to the Current Medication list given to you today.  If you need a refill on your cardiac medications before your next appointment, please call your pharmacy.   Lab work: NONE ORDERED If you have labs (blood work) drawn today and your tests are completely normal, you will receive your results only by: Marland Kitchen MyChart Message (if you have MyChart) OR . A paper copy in the mail If you have any lab test that is abnormal or we need to change your treatment, we will call you to review the results.  Testing/Procedures: NONE ORDERED  Follow-Up: At [facility], you and your health needs are our priority.  As part of our continuing mission to provide you with exceptional heart care, we have created designated Provider Care Teams.  These Care Teams include your primary Cardiologist (physician) and Advanced Practice Providers (APPs -  Physician Assistants and Nurse Practitioners) who all work together to provide you with the care you need, when you need it. You will need a follow up appointment in 12 months.  Please call our office 2 months in advance to schedule this appointment.  You may see Jenkins Rouge, MD or one of the following Advanced Practice Providers on your designated Care Team:   Truitt Merle, NP Cecilie Kicks, NP . Kathyrn Drown, NP  Any Other Special Instructions Will Be Listed Below (If Applicable).

## 2019-03-04 ENCOUNTER — Other Ambulatory Visit: Payer: Self-pay | Admitting: Cardiovascular Disease

## 2019-03-09 ENCOUNTER — Other Ambulatory Visit: Payer: Self-pay | Admitting: Endocrinology

## 2019-03-09 DIAGNOSIS — E119 Type 2 diabetes mellitus without complications: Secondary | ICD-10-CM

## 2019-04-01 ENCOUNTER — Other Ambulatory Visit: Payer: Self-pay

## 2019-04-03 ENCOUNTER — Encounter: Payer: Self-pay | Admitting: Endocrinology

## 2019-04-03 ENCOUNTER — Ambulatory Visit (INDEPENDENT_AMBULATORY_CARE_PROVIDER_SITE_OTHER): Payer: Medicare Other | Admitting: Endocrinology

## 2019-04-03 ENCOUNTER — Other Ambulatory Visit: Payer: Self-pay

## 2019-04-03 VITALS — BP 124/82 | HR 88 | Ht 70.0 in | Wt 243.4 lb

## 2019-04-03 DIAGNOSIS — E119 Type 2 diabetes mellitus without complications: Secondary | ICD-10-CM

## 2019-04-03 DIAGNOSIS — I2583 Coronary atherosclerosis due to lipid rich plaque: Secondary | ICD-10-CM | POA: Diagnosis not present

## 2019-04-03 DIAGNOSIS — I2510 Atherosclerotic heart disease of native coronary artery without angina pectoris: Secondary | ICD-10-CM | POA: Diagnosis not present

## 2019-04-03 LAB — POCT GLYCOSYLATED HEMOGLOBIN (HGB A1C): Hemoglobin A1C: 6.6 % — AB (ref 4.0–5.6)

## 2019-04-03 NOTE — Patient Instructions (Signed)
Please continue the same medications check your blood sugar once a day.  vary the time of day when you check, between before the 3 meals, and at bedtime.  also check if you have symptoms of your blood sugar being too high or too low.  please keep a record of the readings and bring it to your next appointment here (or you can bring the meter itself).  You can write it on any piece of paper.  please call us sooner if your blood sugar goes below 70, or if you have a lot of readings over 200. Please come back for a follow-up appointment in 4-6 months.  

## 2019-04-03 NOTE — Progress Notes (Signed)
Subjective:    Patient ID: Xavier [name], male    DOB: [birth year], 53 y.o.   MRN: [id number]  HPI Pt returns for f/u of diabetes mellitus:  DM type: 2 Dx'ed: 2005 Complications: mild CAD, renal insuff, and polyneuropathy.   Therapy: Ozempic and 3 oral meds.   DKA: never Severe hypoglycemia: never.   Pancreatitis: never.  Other: he did not tolerate pioglitizone (edema); he has never taken insulin, except in the hospital.   Interval history: no cbg record, but states cbg's are well-controlled.  pt states he feels well in general.  He takes meds as rx'ed.   Past Medical History:  Diagnosis Date  . ANEMIA, PERNICIOUS 04/03/2007  . Anxiety   . Arthritis    "left hand" (06/26/2016)  . Chronic lower back pain   . Colon cancer screening 10/27/2016   Cologuard NEG-->rpt 3 yrs  . Complication of anesthesia    "w/hand OR; spouse had to go into PACU & nudge me q little bit; just wouldn't wake up"  . CORONARY ARTERY DISEASE 12/05/2006  . Depression   . DIABETES MELLITUS, TYPE II dx'd 2001  . DVT (deep venous thrombosis) (HCC) 2016   BLE  . GERD (gastroesophageal reflux disease)   . History of blood transfusion 2001   "when I had my hand OR"  . History of gout   . History of kidney stones   . HYPERCHOLESTEROLEMIA 07/17/2007  . HYPERTENSION 12/05/2006  . Learning disability   . Migraine    "1-2/month" (06/26/2016)  . Myocardial infarction (HCC) ~ 2003  . Obesity, Class II, BMI 35-39.9   . OSA on CPAP    Pt cannot recall setting clearly but thinks it is 5 cm h20.    . PE (pulmonary embolism) 2016  . Stroke [facility]) ~ 2006   denies residual on 06/26/2016  . TIA (transient ischemic attack) ?06/25/2016  . VISUAL ACUITY, DECREASED, RIGHT EYE 02/19/2009    Past Surgical History:  Procedure Laterality Date  . CARDIAC CATHETERIZATION  1990s; 2012; 06/2016   06/2016 distal LAD DES stent.  EF 55-65%  . CARDIOVASCULAR STRESS TEST  06/2016   Echo stress test->"inconclusive"-->cath was done after  this  . CATARACT EXTRACTION W/ INTRAOCULAR LENS IMPLANT Right 2017  . CORONARY STENT INTERVENTION N/A 06/27/2016   Procedure: Coronary Stent Intervention;  Surgeon: Marykay Lex, MD;  Location: [facility] INVASIVE CV LAB;  Service: Cardiovascular;  Laterality: N/A;  . EYE SURGERY Right    Cataract  . INGUINAL HERNIA REPAIR Bilateral 1990s  . INTRAVASCULAR PRESSURE WIRE/FFR STUDY N/A 06/27/2016   Procedure: Intravascular Pressure Wire/FFR Study;  Surgeon: Marykay Lex, MD;  Location: [facility] INVASIVE CV LAB;  Service: Cardiovascular;  Laterality: N/A;  . LEFT HEART CATH AND CORONARY ANGIOGRAPHY N/A 06/27/2016   Procedure: Left Heart Cath and Coronary Angiography;  Surgeon: Marykay Lex, MD;  Location: [facility] INVASIVE CV LAB;  Service: Cardiovascular;  Laterality: N/A;  . REATTACHMENT HAND Left ~ 2001   w/carpal tunnel release  . UMBILICAL HERNIA REPAIR  1990s   w/IHR    Social History   Socioeconomic History  . Marital status: Married    Spouse name: Not on file  . Number of children: Not on file  . Years of education: Not on file  . Highest education level: Not on file  Occupational History  . Occupation: Disabled    Employer: DISABLED  Social Needs  . Financial resource strain: Not on file  . Food insecurity  Worry: Not on file    Inability: Not on file  . Transportation needs    Medical: Not on file    Non-medical: Not on file  Tobacco Use  . Smoking status: Former Smoker    Packs/day: 0.10    Years: 3.00    Pack years: 0.30    Types: Cigarettes    Quit date: 05/15/1984    Years since quitting: 34.9  . Smokeless tobacco: Never Used  Substance and Sexual Activity  . Alcohol use: No    Frequency: Never  . Drug use: No  . Sexual activity: Not on file  Lifestyle  . Physical activity    Days per week: Not on file    Minutes per session: Not on file  . Stress: Not on file  Relationships  . Social Herbalist on phone: Not on file    Gets together: Not on file     Attends religious service: Not on file    Active member of club or organization: Not on file    Attends meetings of clubs or organizations: Not on file    Relationship status: Not on file  . Intimate partner violence    Fear of current or ex partner: Not on file    Emotionally abused: Not on file    Physically abused: Not on file    Forced sexual activity: Not on file  Other Topics Concern  . Not on file  Social History Narrative   Married, has 2 sons, no grandchildren.   Orig from [place], born in [place].   Disabled due to left wrist injury at work.   Tobacco: none   Alc: none    Current Outpatient Medications on File Prior to Visit  Medication Sig Dispense Refill  . acetaminophen (TYLENOL) 500 MG tablet Take 1,000 mg by mouth every 6 (six) hours as needed.    . bromocriptine (PARLODEL) 2.5 MG tablet Take 1 tablet (2.5 mg total) by mouth daily. TAKE 1/4 TABLET BY MOUTH EVERY DAY (Patient taking differently: Take 2.5 mg by mouth daily. ) 45 tablet 3  . clopidogrel (PLAVIX) 75 MG tablet TAKE 1 TABLET BY MOUTH EVERY DAY 90 tablet 3  . diazepam (VALIUM) 10 MG tablet Take 10 mg by mouth 3 (three) times daily.    . DULoxetine (CYMBALTA) 30 MG capsule Take 30 mg by mouth daily.      . empagliflozin (JARDIANCE) 25 MG TABS tablet Take 25 mg by mouth daily. 90 tablet 3  . fenofibrate (TRICOR) 145 MG tablet TAKE 1 TABLET BY MOUTH EVERY DAY 90 tablet 1  . FLUoxetine (PROZAC) 40 MG capsule Take 40 mg by mouth daily.      . fluticasone (FLONASE) 50 MCG/ACT nasal spray USE ONE SPRAY IN EACH NOSTRIL TWICE A DAY 16 g 2  . hydrochlorothiazide (HYDRODIURIL) 12.5 MG tablet TAKE 1 TABLET BY MOUTH EVERY DAY WITH THE LOSARTAN 90 tablet 1  . losartan (COZAAR) 100 MG tablet TAKE 1/2 TABLET BY MOUTH DAILY 45 tablet 1  . metFORMIN (GLUCOPHAGE) 500 MG tablet TAKE 2 TABLETS BY MOUTH TWICE A DAY 360 tablet 1  . nitroGLYCERIN (NITROSTAT) 0.4 MG SL tablet Place 1 tablet (0.4 mg total) under the tongue every 5  (five) minutes as needed for chest pain. 10 tablet 0  . nystatin cream (MYCOSTATIN) Apply to affected area 2 times daily 60 g 0  . ONETOUCH VERIO test strip USE TO CHECK BLOOD SUGAR 1 TIME PER DAY. DX  CODE: E11.9 100 each 2  . OZEMPIC, 1 MG/DOSE, 2 MG/1.5ML SOPN INJECT 1 MG INTO THE SKIN ONCE A WEEK. 9 pen 3  . rosuvastatin (CRESTOR) 5 MG tablet TAKE 1 TABLET BY MOUTH EVERY DAY 90 tablet 1  . traMADol (ULTRAM) 50 MG tablet Take 1-2 tabs at bedtime prn pain 14 tablet 1  . XARELTO 20 MG TABS tablet TAKE 1 TABLET BY MOUTH EVERY DAY BEFORE SUPPER 90 tablet 1   No current facility-administered medications on file prior to visit.     Allergies  Allergen Reactions  . Actos [Pioglitazone] Other (See Comments)    Potential cause of DVT  . Bee Venom Anaphylaxis  . Klonopin [Clonazepam] Anaphylaxis  . Invokana [Canagliflozin] Nausea Only    Nausea/dizzy/bad taste in mouth  . Niacin Other (See Comments)    "makes his crazy"    Family History  Problem Relation Age of Onset  . COPD Mother   . Lymphoma Father     BP 124/82 (BP Location: Right Arm, Patient Position: Sitting, Cuff Size: Large)   Pulse 88   Ht 5\' 10"  (1.778 m)   Wt 243 lb 6.4 oz (110.4 kg)   SpO2 98%   BMI 34.92 kg/m    Review of Systems Denies nausea.      Objective:   Physical Exam VITAL SIGNS:  See vs page GENERAL: no distress Pulses: dorsalis pedis intact bilat.   MSK: no deformity of the feet CV: trace bilat leg edema Skin:  no ulcer on the feet.  normal color and temp on the feet. Neuro: sensation is intact to touch on the feet  Lab Results  Component Value Date   CREATININE 1.24 01/17/2019   BUN 15 01/17/2019   NA 135 01/17/2019   K 4.3 01/17/2019   CL 100 01/17/2019   CO2 23 01/17/2019    Lab Results  Component Value Date   HGBA1C 6.6 (A) 04/03/2019      Assessment & Plan:  Type 2 DM, with CAD: well-controlled Edema: This limits rx options   Patient Instructions  Please continue the same  medications. check your blood sugar once a day.  vary the time of day when you check, between before the 3 meals, and at bedtime.  also check if you have symptoms of your blood sugar being too high or too low.  please keep a record of the readings and bring it to your next appointment here (or you can bring the meter itself).  You can write it on any piece of paper.  please call us sooner if your blood sugar goes below 70, or if you have a lot of readings over 200.  Please come back for a follow-up appointment in 4-6 months

## 2019-06-01 ENCOUNTER — Other Ambulatory Visit: Payer: Self-pay | Admitting: Endocrinology

## 2019-06-28 ENCOUNTER — Other Ambulatory Visit: Payer: Self-pay | Admitting: Family Medicine

## 2019-07-16 ENCOUNTER — Other Ambulatory Visit: Payer: Self-pay

## 2019-07-17 ENCOUNTER — Ambulatory Visit (INDEPENDENT_AMBULATORY_CARE_PROVIDER_SITE_OTHER): Payer: Medicare Other | Admitting: Family Medicine

## 2019-07-17 ENCOUNTER — Encounter: Payer: Self-pay | Admitting: Family Medicine

## 2019-07-17 VITALS — BP 123/86 | HR 74 | Temp 97.7°F | Resp 16 | Ht 70.0 in | Wt 235.8 lb

## 2019-07-17 DIAGNOSIS — E7800 Pure hypercholesterolemia, unspecified: Secondary | ICD-10-CM

## 2019-07-17 DIAGNOSIS — Z7901 Long term (current) use of anticoagulants: Secondary | ICD-10-CM

## 2019-07-17 DIAGNOSIS — E118 Type 2 diabetes mellitus with unspecified complications: Secondary | ICD-10-CM

## 2019-07-17 DIAGNOSIS — I2510 Atherosclerotic heart disease of native coronary artery without angina pectoris: Secondary | ICD-10-CM | POA: Diagnosis not present

## 2019-07-17 DIAGNOSIS — I10 Essential (primary) hypertension: Secondary | ICD-10-CM

## 2019-07-17 DIAGNOSIS — Z86718 Personal history of other venous thrombosis and embolism: Secondary | ICD-10-CM

## 2019-07-17 LAB — CBC
HCT: 46 % (ref 39.0–52.0)
Hemoglobin: 15.3 g/dL (ref 13.0–17.0)
MCHC: 33.3 g/dL (ref 30.0–36.0)
MCV: 92.1 fl (ref 78.0–100.0)
Platelets: 397 10*3/uL (ref 150.0–400.0)
RBC: 4.99 Mil/uL (ref 4.22–5.81)
RDW: 13.5 % (ref 11.5–15.5)
WBC: 7.6 10*3/uL (ref 4.0–10.5)

## 2019-07-17 LAB — BASIC METABOLIC PANEL
BUN: 19 mg/dL (ref 6–23)
CO2: 27 mEq/L (ref 19–32)
Calcium: 10.1 mg/dL (ref 8.4–10.5)
Chloride: 99 mEq/L (ref 96–112)
Creatinine, Ser: 1.2 mg/dL (ref 0.40–1.50)
GFR: 63.32 mL/min (ref >60.00)
Glucose, Bld: 131 mg/dL — ABNORMAL HIGH (ref 70–99)
Potassium: 3.9 mEq/L (ref 3.5–5.1)
Sodium: 136 mEq/L (ref 135–145)

## 2019-07-17 LAB — LIPID PANEL
Cholesterol: 159 mg/dL (ref 0–200)
HDL: 61 mg/dL (ref >39.00)
LDL Cholesterol: 68 mg/dL (ref 0–99)
NonHDL: 97.84
Total CHOL/HDL Ratio: 3
Triglycerides: 147 mg/dL (ref 0.0–149.0)
VLDL: 29.4 mg/dL (ref 0.0–40.0)

## 2019-07-17 NOTE — Progress Notes (Signed)
OFFICE VISIT  07/17/2019   CC:  Chief Complaint  Patient presents with  . Follow-up    RCI, pt is not fasting   HPI:    Patient is a 54 y.o. 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 He has CAD with hx of MI + stents, preserved LV function (plavix qd)--Dr. Johnsie Cancel. He has DM and is followed by Dr. Loanne Drilling for this.  Last visit with me all was stable, no changes were made. Visit wth Dr. Loanne Drilling 04/03/19 for DM f/u -->all stable, no changes.  Interim hx: Feeling well.  [place] shooting for fun.  HTN: no home bp monitoring. Diet: limiting starches, has avoided fast food.   NO formal exercise but he is active.  HLD: no probs with statin and fibrate.  DVT, anticoag indefinitely with DOAC.   No nosebleeds, rectal or urine bleeding, no excessive bruising.  Taking some tramadol prn for orthopedist, no procedure has been done yet, so he just grins and bears it. Taking valium qAM for anxiety.  PMP AWARE reviewed today: most recent rx for valium 10mg  was filled 06/16/19, # 71, rx by Dr. Toy Care.  No red flags.  ROS: no fevers, no CP, no SOB, no wheezing, no cough, no dizziness, no HAs, no rashes, no melena/hematochezia.  No polyuria or polydipsia.  No myalgias or arthralgias.   Past Medical History:  Diagnosis Date  . ANEMIA, PERNICIOUS 04/03/2007  . Anxiety   . Arthritis    "left hand" (06/26/2016)  . Chronic lower back pain   . Colon cancer screening 10/27/2016   Cologuard NEG-->rpt 3 yrs  . Complication of anesthesia    "w/hand OR; spouse had to go into PACU & nudge me q little bit; just wouldn't wake up"  . CORONARY ARTERY DISEASE 12/05/2006  . Depression   . DIABETES MELLITUS, TYPE II dx'd 2001  . DVT (deep venous thrombosis) ([place]) 2016   BLE  . GERD (gastroesophageal reflux disease)   . History of blood transfusion 2001   "when I had my hand OR"  . History of gout   . History of kidney stones   .  HYPERCHOLESTEROLEMIA 07/17/2007  . HYPERTENSION 12/05/2006  . Learning disability   . Migraine    "1-2/month" (06/26/2016)  . Myocardial infarction ([place]) ~ 2003  . Obesity, Class II, BMI 35-39.9   . OSA on CPAP    Pt cannot recall setting clearly but thinks it is 5 cm h20.    . PE (pulmonary embolism) 2016  . Stroke [facility]) ~ 2006   denies residual on 06/26/2016  . TIA (transient ischemic attack) ?06/25/2016  . VISUAL ACUITY, DECREASED, RIGHT EYE 02/19/2009    Past Surgical History:  Procedure Laterality Date  . CARDIAC CATHETERIZATION  1990s; 2012; 06/2016   06/2016 distal LAD DES stent.  EF 55-65%  . CARDIOVASCULAR STRESS TEST  06/2016   Echo stress test->"inconclusive"-->cath was done after this  . CATARACT EXTRACTION W/ INTRAOCULAR LENS IMPLANT Right 2017  . CORONARY STENT INTERVENTION N/A 06/27/2016   Procedure: Coronary Stent Intervention;  Surgeon: Leonie Man, MD;  Location: [place] CV LAB;  Service: Cardiovascular;  Laterality: N/A;  . EYE SURGERY Right    Cataract  . INGUINAL HERNIA REPAIR Bilateral 1990s  . INTRAVASCULAR PRESSURE WIRE/FFR STUDY N/A 06/27/2016   Procedure: Intravascular Pressure Wire/FFR Study;  Surgeon: Leonie Man, MD;  Location: [place] CV LAB;  Service: Cardiovascular;  Laterality: N/A;  .  LEFT HEART CATH AND CORONARY ANGIOGRAPHY N/A 06/27/2016   Procedure: Left Heart Cath and Coronary Angiography;  Surgeon: Marykay Lex, MD;  Location[facility] INVASIVE CV LAB;  Service: Cardiovascular;  Laterality: N/A;  . REATTACHMENT HAND Left ~ 2001   w/carpal tunnel release  . UMBILICAL HERNIA REPAIR  1990s   w/IHR   Social History   Socioeconomic History  . Marital status: Married    Spouse name: Not on file  . Number of children: Not on file  . Years of education: Not on file  . Highest education level: Not on file  Occupational History  . Occupation: Disabled    Employer: DISABLED  Tobacco Use  . Smoking status: Former Smoker    Packs/day: 0.10     Years: 3.00    Pack years: 0.30    Types: Cigarettes    Quit date: 05/15/1984    Years since quitting: 35.1  . Smokeless tobacco: Never Used  Substance and Sexual Activity  . Alcohol use: No  . Drug use: No  . Sexual activity: Not on file  Other Topics Concern  . Not on file  Social History Narrative   Married, has 2 sons, no grandchildren.   Orig from [place], born in [place].   Disabled due to left wrist injury at work.   Tobacco: none   Alc: none   Social Determinants of Health   Financial Resource Strain:   . Difficulty of Paying Living Expenses: Not on file  Food Insecurity:   . Worried About Programme researcher, broadcasting/film/video in the Last Year: Not on file  . Ran Out of Food in the Last Year: Not on file  Transportation Needs:   . Lack of Transportation (Medical): Not on file  . Lack of Transportation (Non-Medical): Not on file  Physical Activity:   . Days of Exercise per Week: Not on file  . Minutes of Exercise per Session: Not on file  Stress:   . Feeling of Stress : Not on file  Social Connections:   . Frequency of Communication with Friends and Family: Not on file  . Frequency of Social Gatherings with Friends and Family: Not on file  . Attends Religious Services: Not on file  . Active Member of Clubs or Organizations: Not on file  . Attends Banker Meetings: Not on file  . Marital Status: Not on file    Outpatient Medications Prior to Visit  Medication Sig Dispense Refill  . bromocriptine (PARLODEL) 2.5 MG tablet Take 1 tablet (2.5 mg total) by mouth daily. TAKE 1/4 TABLET BY MOUTH EVERY DAY (Patient taking differently: Take 2.5 mg by mouth daily. ) 45 tablet 3  . clopidogrel (PLAVIX) 75 MG tablet TAKE 1 TABLET BY MOUTH EVERY DAY 90 tablet 3  . Cyanocobalamin (VITAMIN B12 PO) Take by mouth daily.    . diazepam (VALIUM) 10 MG tablet Take 10 mg by mouth 3 (three) times daily.    . DULoxetine (CYMBALTA) 30 MG capsule Take 30 mg by mouth daily.      .  empagliflozin (JARDIANCE) 25 MG TABS tablet Take 25 mg by mouth daily. 90 tablet 3  . fenofibrate (TRICOR) 145 MG tablet TAKE 1 TABLET BY MOUTH EVERY DAY 90 tablet 1  . FLUoxetine (PROZAC) 40 MG capsule Take 40 mg by mouth daily.      . hydrochlorothiazide (HYDRODIURIL) 12.5 MG tablet TAKE 1 TABLET BY MOUTH EVERY DAY WITH THE LOSARTAN 30 tablet 0  . losartan (COZAAR)  100 MG tablet TAKE 1/2 TABLET BY MOUTH DAILY 45 tablet 1  . metFORMIN (GLUCOPHAGE) 500 MG tablet TAKE 2 TABLETS BY MOUTH TWICE A DAY 360 tablet 1  . ONETOUCH VERIO test strip USE TO CHECK BLOOD SUGAR 1 TIME PER DAY. DX CODE: E11.9 100 each 2  . OZEMPIC, 1 MG/DOSE, 2 MG/1.5ML SOPN INJECT 1 MG INTO THE SKIN ONCE A WEEK. 9 pen 3  . rosuvastatin (CRESTOR) 5 MG tablet TAKE 1 TABLET BY MOUTH EVERY DAY 90 tablet 1  . traMADol (ULTRAM) 50 MG tablet Take 1-2 tabs at bedtime prn pain 14 tablet 1  . XARELTO 20 MG TABS tablet TAKE 1 TABLET BY MOUTH EVERY DAY BEFORE SUPPER 90 tablet 1  . acetaminophen (TYLENOL) 500 MG tablet Take 1,000 mg by mouth every 6 (six) hours as needed.    . fluticasone (FLONASE) 50 MCG/ACT nasal spray USE ONE SPRAY IN EACH NOSTRIL TWICE A DAY (Patient not taking: Reported on 07/17/2019) 16 g 2  . nitroGLYCERIN (NITROSTAT) 0.4 MG SL tablet Place 1 tablet (0.4 mg total) under the tongue every 5 (five) minutes as needed for chest pain. (Patient not taking: Reported on 07/17/2019) 10 tablet 0  . nystatin cream (MYCOSTATIN) Apply to affected area 2 times daily (Patient not taking: Reported on 07/17/2019) 60 g 0  . metFORMIN (GLUCOPHAGE-XR) 500 MG 24 hr tablet TAKE 2 TABLETS BY MOUTH TWICE A DAY (Patient not taking: Reported on 07/17/2019) 360 tablet 0   No facility-administered medications prior to visit.    Allergies  Allergen Reactions  . Actos [Pioglitazone] Other (See Comments)    Potential cause of DVT  . Bee Venom Anaphylaxis  . Klonopin [Clonazepam] Anaphylaxis  . Invokana [Canagliflozin] Nausea Only    Nausea/dizzy/bad  taste in mouth  . Niacin Other (See Comments)    "makes his crazy"    ROS As per HPI  PE: Blood pressure 123/86, pulse 74, temperature 97.7 F (36.5 C), temperature source Temporal, resp. rate 16, height 5\' 10"  (1.778 m), weight 235 lb 12.8 oz (107 kg), SpO2 95 %. Body mass index is 33.83 kg/m.  Gen: Alert, well appearing.  Patient is oriented to person, place, time, and situation. AFFECT: pleasant, lucid thought and speech. CV: RRR, no m/r/g.   LUNGS: CTA bilat, nonlabored resps, good aeration in all lung fields. EXT: no clubbing or cyanosis.  no edema.    LABS:  Lab Results  Component Value Date   TSH 2.73 10/10/2017   Lab Results  Component Value Date   WBC 8.4 11/04/2018   HGB 15.0 11/04/2018   HCT 45.0 11/04/2018   MCV 92.6 11/04/2018   PLT 427.0 (H) 11/04/2018   Lab Results  Component Value Date   CREATININE 1.24 01/17/2019   BUN 15 01/17/2019   NA 135 01/17/2019   K 4.3 01/17/2019   CL 100 01/17/2019   CO2 23 01/17/2019   Lab Results  Component Value Date   ALT 17 01/17/2019   AST 20 01/17/2019   ALKPHOS 70 10/10/2017   BILITOT 0.5 01/17/2019   Lab Results  Component Value Date   CHOL 152 11/04/2018   Lab Results  Component Value Date   HDL 49.50 11/04/2018   Lab Results  Component Value Date   LDLCALC (H) 06/11/2010    120        Total Cholesterol/HDL:CHD Risk Coronary Heart Disease Risk Table  Men   Women  1/2 Average Risk   3.4   3.3  Average Risk       5.0   4.4  2 X Average Risk   9.6   7.1  3 X Average Risk  23.4   11.0        Use the calculated Patient Ratio above and the CHD Risk Table to determine the patient's CHD Risk.        ATP III CLASSIFICATION (LDL):  <100     mg/dL   Optimal  700-174  mg/dL   Near or Above                    Optimal  130-159  mg/dL   Borderline  944-967  mg/dL   High  >591     mg/dL   Very High   Lab Results  Component Value Date   TRIG 216.0 (H) 11/04/2018   Lab Results   Component Value Date   CHOLHDL 3 11/04/2018   Lab Results  Component Value Date   PSA 0.17 11/04/2018   PSA 0.26 10/10/2017   PSA 0.13 09/12/2016   Lab Results  Component Value Date   HGBA1C 6.6 (A) 04/03/2019   Lab Results  Component Value Date   PSA 0.17 11/04/2018   PSA 0.26 10/10/2017   PSA 0.13 09/12/2016    IMPRESSION AND PLAN:  1) HTN: The current medical regimen is effective;  continue present plan and medications. Lytes/cr today.  2) HLD: tolerating statin and fibrate.  3) Hx of recurrent DVT, now on anticoag with xarelto indefinitely. Also on plavix for hx of CAD with stent.  No sign of bleeding. CBC today.  4) DM: managed by Dr. Everardo All, good control.  5) CAD, hx of stent, preserved LV function, no angina. Continue cardiology f/u.  Cont plavix and statin.  An After Visit Summary was printed and given to the patient.  FOLLOW UP: Return in about 6 months (around 01/17/2020) for routine chronic illness f/u.  Signed:  Santiago Bumpers, MD           07/17/2019

## 2019-07-21 ENCOUNTER — Telehealth: Payer: Self-pay

## 2019-07-21 NOTE — Telephone Encounter (Signed)
LM for patient to call back about fax received from [facility]. Pharmacy not listed on patient's chart.

## 2019-07-22 ENCOUNTER — Other Ambulatory Visit: Payer: Self-pay | Admitting: Family Medicine

## 2019-07-22 NOTE — Telephone Encounter (Signed)
Contacted patient regarding form received from [facility] pharmacy. Patient will not be using this company. Okay to disregard form

## 2019-07-25 ENCOUNTER — Telehealth: Payer: Self-pay

## 2019-07-25 NOTE — Telephone Encounter (Signed)
Received medicare written order for Freestyle libre 14 day sensor. Placed on PCP desk to review and sign, if appropriate.

## 2019-07-28 NOTE — Telephone Encounter (Signed)
PCP signed and completed form. Paperwork currently being faxed and waiting for confirmation.

## 2019-07-28 NOTE — Telephone Encounter (Addendum)
Fax confirmation received. Paperwork incomplete, needing amount of time testing and time injecting. Placed on PCP desk to review and sign, if appropriate.

## 2019-08-04 ENCOUNTER — Other Ambulatory Visit: Payer: Self-pay | Admitting: Pulmonary Disease

## 2019-08-05 ENCOUNTER — Telehealth: Payer: Self-pay

## 2019-08-05 NOTE — Telephone Encounter (Signed)
Received requisite form, regarding diagnostic pharmacogenomic test that can be completed from pt's home. Patient was told this was requested by PCP.  Placed on PCP desk to review and sign, if appropriate.

## 2019-08-08 ENCOUNTER — Other Ambulatory Visit: Payer: Self-pay

## 2019-08-08 MED ORDER — FENOFIBRATE 145 MG PO TABS: 145.0000 mg | ORAL_TABLET | Freq: Every day | ORAL | 1 refills | Status: DC

## 2019-08-11 NOTE — Telephone Encounter (Signed)
I'm shredding this form. I did not recommend this.

## 2019-08-12 ENCOUNTER — Telehealth: Payer: Self-pay

## 2019-08-12 NOTE — Telephone Encounter (Signed)
Received refill request via fax for pt's [facility] [place]. Last refill sent 08/08/19 #90 with [facility] [place]. Left message for pt to call back if pharmacy needs to be updated or transferred.

## 2019-08-13 ENCOUNTER — Other Ambulatory Visit: Payer: Self-pay

## 2019-08-13 MED ORDER — LOSARTAN POTASSIUM 100 MG PO TABS: 50.0000 mg | ORAL_TABLET | Freq: Every day | ORAL | 1 refills | Status: DC

## 2019-08-13 NOTE — Telephone Encounter (Signed)
Patient Name: Marcello Neaves Gender: Male DOB: 05-31-1965 Age: 54 Y 1 M 20 D Return Phone Number: [phone] (Primary), [phone] (Secondary) Address[place] [address] Client [facility] Night - Client Client Site [facility] Night Physician Santiago Bumpers - MD Contact Type Call Who Is Calling Patient / Member / Family / Caregiver Call Type Triage / Clinical Relationship To Patient Self Return Phone Number [phone] (Primary) Chief Complaint Prescription Refill or Medication Request (non symptomatic) Reason for Call Medication Question / Request Initial Comment Caller states his Losartan was sent to the wrong pharmacy. No Sx. Translation No Nurse Assessment Nurse: Ladona Ridgel, RN, Clydie Braun Date/Time Lamount Cohen Time): 08/12/2019 10:36:12 PM Confirm and document reason for call. If symptomatic, describe symptoms. ---Caller states that his losartan was sent to wrong pharmacy. [phone] He has 3 pills left. He has been out for 2 weeks prior. He states that she will call pharmacy to have it transferred and will follow up with pcp. No symptoms. Has the patient had close contact with a person known or suspected to have the novel coronavirus illness OR traveled / lives in area with major community spread (including international travel) in the last 14 days from the onset of symptoms? * If Asymptomatic, screen for exposure and travel within the last 14 days. ---No Does the patient have any new or worsening symptoms? ---Yes Will a triage be completed? ---Yes Related visit to physician within the last 2 weeks? ---Yes Does the PT have any chronic conditions? (i.e. diabetes, asthma, this includes High risk factors for pregnancy, etc.) ---Yes List chronic conditions. ---htn Is this a behavioral health or substance abuse call? ---No Guidelines Guideline Title Affirmed Question  Affirmed Notes Nurse Date/Time (Eastern Time) Disp. Time Lamount Cohen Time) Disposition Final User 08/12/2019 10:18:56 PM Send To Nurse Donzetta Matters, RN, Kaila3/30/2021 10:50:51 PM Clinical Call Yes Ladona Ridgel, RN, Clydie Braun

## 2019-08-13 NOTE — Telephone Encounter (Signed)
Ref[facility] sent [facility] [place]. Patient confirmed he only uses [place] location. Nothing further needed.

## 2019-08-23 ENCOUNTER — Other Ambulatory Visit: Payer: Self-pay | Admitting: Family Medicine

## 2019-08-24 ENCOUNTER — Other Ambulatory Visit: Payer: Self-pay | Admitting: Endocrinology

## 2019-09-02 ENCOUNTER — Other Ambulatory Visit: Payer: Self-pay

## 2019-09-03 ENCOUNTER — Ambulatory Visit (INDEPENDENT_AMBULATORY_CARE_PROVIDER_SITE_OTHER): Payer: Medicare Other | Admitting: Endocrinology

## 2019-09-03 ENCOUNTER — Encounter: Payer: Self-pay | Admitting: Endocrinology

## 2019-09-03 VITALS — BP 104/70 | HR 78 | Ht 70.0 in | Wt 239.0 lb

## 2019-09-03 DIAGNOSIS — I2510 Atherosclerotic heart disease of native coronary artery without angina pectoris: Secondary | ICD-10-CM | POA: Diagnosis not present

## 2019-09-03 DIAGNOSIS — E119 Type 2 diabetes mellitus without complications: Secondary | ICD-10-CM

## 2019-09-03 LAB — POCT GLYCOSYLATED HEMOGLOBIN (HGB A1C): Hemoglobin A1C: 6.7 % — AB (ref 4.0–5.6)

## 2019-09-03 NOTE — Progress Notes (Signed)
   Subjective:    Patient ID: Xavier [name], male    DOB: [birth year], 54 y.o.   MRN: [id number]  HPI Pt returns for f/u of diabetes mellitus:  DM type: 2 Dx'ed: 2005 Complications: CAD, CRI, and PN.   Therapy: Ozempic and 3 oral meds.   DKA: never Severe hypoglycemia: never.   Pancreatitis: never.  Other: he did not tolerate pioglitizone (edema); he has never taken insulin, except in the hospital.   Interval history: no cbg record, but states cbg's are well-controlled.  pt states he feels well in general.  He takes meds as rx'ed.   .bsai  Review of Systems He denies hypoglycemia and nausea    Objective:   Physical Exam VITAL SIGNS:  See vs page GENERAL: no distress Pulses: dorsalis pedis intact bilat.   MSK: no deformity of the feet CV: trace bilat leg edema.   Skin:  no ulcer on the feet.  normal color and temp on the feet.  Neuro: sensation is intact to touch on the feet.    Lab Results  Component Value Date   HGBA1C 6.7 (A) 09/03/2019   Lab Results  Component Value Date   TSH 2.73 10/10/2017   Lab Results  Component Value Date   CREATININE 1.20 07/17/2019   BUN 19 07/17/2019   NA 136 07/17/2019   K 3.9 07/17/2019   CL 99 07/17/2019   CO2 27 07/17/2019       Assessment & Plan:  Type 2 DM: well-controlled Edema: This limits rx options   Patient Instructions  Please continue the same medications. check your blood sugar once a day.  vary the time of day when you check, between before the 3 meals, and at bedtime.  also check if you have symptoms of your blood sugar being too high or too low.  please keep a record of the readings and bring it to your next appointment here (or you can bring the meter itself).  You can write it on any piece of paper.  please call us sooner if your blood sugar goes below 70, or if you have a lot of readings over 200.  Please come back for a follow-up appointment in 6 months.

## 2019-09-03 NOTE — Patient Instructions (Addendum)
Please continue the same medications check your blood sugar once a day.  vary the time of day when you check, between before the 3 meals, and at bedtime.  also check if you have symptoms of your blood sugar being too high or too low.  please keep a record of the readings and bring it to your next appointment here (or you can bring the meter itself).  You can write it on any piece of paper.  please call us sooner if your blood sugar goes below 70, or if you have a lot of readings over 200. Please come back for a follow-up appointment in 6 months.   

## 2019-09-08 ENCOUNTER — Other Ambulatory Visit: Payer: Self-pay | Admitting: Endocrinology

## 2019-11-03 ENCOUNTER — Other Ambulatory Visit: Payer: Self-pay | Admitting: Pulmonary Disease

## 2019-11-20 ENCOUNTER — Other Ambulatory Visit: Payer: Self-pay | Admitting: Endocrinology

## 2019-12-05 DIAGNOSIS — I499 Cardiac arrhythmia, unspecified: Secondary | ICD-10-CM | POA: Diagnosis not present

## 2019-12-05 DIAGNOSIS — Z9861 Coronary angioplasty status: Secondary | ICD-10-CM | POA: Diagnosis not present

## 2019-12-05 DIAGNOSIS — R531 Weakness: Secondary | ICD-10-CM | POA: Diagnosis not present

## 2019-12-05 DIAGNOSIS — R002 Palpitations: Secondary | ICD-10-CM | POA: Diagnosis not present

## 2019-12-06 DIAGNOSIS — Z23 Encounter for immunization: Secondary | ICD-10-CM | POA: Diagnosis not present

## 2019-12-14 ENCOUNTER — Other Ambulatory Visit: Payer: Self-pay | Admitting: Endocrinology

## 2019-12-27 DIAGNOSIS — Z23 Encounter for immunization: Secondary | ICD-10-CM | POA: Diagnosis not present

## 2020-01-20 ENCOUNTER — Ambulatory Visit (INDEPENDENT_AMBULATORY_CARE_PROVIDER_SITE_OTHER): Payer: Medicare Other | Admitting: Family Medicine

## 2020-01-20 ENCOUNTER — Other Ambulatory Visit: Payer: Self-pay

## 2020-01-20 ENCOUNTER — Encounter: Payer: Self-pay | Admitting: Family Medicine

## 2020-01-20 VITALS — BP 119/79 | HR 67 | Temp 97.7°F | Resp 16 | Wt 237.6 lb

## 2020-01-20 DIAGNOSIS — Z1211 Encounter for screening for malignant neoplasm of colon: Secondary | ICD-10-CM

## 2020-01-20 DIAGNOSIS — I10 Essential (primary) hypertension: Secondary | ICD-10-CM | POA: Diagnosis not present

## 2020-01-20 DIAGNOSIS — I2510 Atherosclerotic heart disease of native coronary artery without angina pectoris: Secondary | ICD-10-CM | POA: Diagnosis not present

## 2020-01-20 DIAGNOSIS — Z23 Encounter for immunization: Secondary | ICD-10-CM

## 2020-01-20 DIAGNOSIS — E7800 Pure hypercholesterolemia, unspecified: Secondary | ICD-10-CM

## 2020-01-20 DIAGNOSIS — Z7901 Long term (current) use of anticoagulants: Secondary | ICD-10-CM | POA: Diagnosis not present

## 2020-01-20 DIAGNOSIS — Z125 Encounter for screening for malignant neoplasm of prostate: Secondary | ICD-10-CM

## 2020-01-20 DIAGNOSIS — Z86718 Personal history of other venous thrombosis and embolism: Secondary | ICD-10-CM

## 2020-01-20 LAB — CBC WITH DIFFERENTIAL/PLATELET
Basophils Absolute: 0.1 10*3/uL (ref 0.0–0.1)
Basophils Relative: 1.3 % (ref 0.0–3.0)
Eosinophils Absolute: 0.1 10*3/uL (ref 0.0–0.7)
Eosinophils Relative: 2.6 % (ref 0.0–5.0)
HCT: 41.5 % (ref 39.0–52.0)
Hemoglobin: 13.9 g/dL (ref 13.0–17.0)
Lymphocytes Relative: 24.8 % (ref 12.0–46.0)
Lymphs Abs: 1.3 10*3/uL (ref 0.7–4.0)
MCHC: 33.4 g/dL (ref 30.0–36.0)
MCV: 93.2 fl (ref 78.0–100.0)
Monocytes Absolute: 0.5 10*3/uL (ref 0.1–1.0)
Monocytes Relative: 9.2 % (ref 3.0–12.0)
Neutro Abs: 3.3 10*3/uL (ref 1.4–7.7)
Neutrophils Relative %: 62.1 % (ref 43.0–77.0)
Platelets: 394 10*3/uL (ref 150.0–400.0)
RBC: 4.45 Mil/uL (ref 4.22–5.81)
RDW: 13.9 % (ref 11.5–15.5)
WBC: 5.4 10*3/uL (ref 4.0–10.5)

## 2020-01-20 LAB — COMPREHENSIVE METABOLIC PANEL
ALT: 19 U/L (ref 0–53)
AST: 16 U/L (ref 0–37)
Albumin: 4.4 g/dL (ref 3.5–5.2)
Alkaline Phosphatase: 30 U/L — ABNORMAL LOW (ref 39–117)
BUN: 21 mg/dL (ref 6–23)
CO2: 27 mEq/L (ref 19–32)
Calcium: 9.8 mg/dL (ref 8.4–10.5)
Chloride: 103 mEq/L (ref 96–112)
Creatinine, Ser: 1.32 mg/dL (ref 0.40–1.50)
GFR: 56.61 mL/min — ABNORMAL LOW (ref >60.00)
Glucose, Bld: 106 mg/dL — ABNORMAL HIGH (ref 70–99)
Potassium: 4.5 mEq/L (ref 3.5–5.1)
Sodium: 139 mEq/L (ref 135–145)
Total Bilirubin: 0.5 mg/dL (ref 0.2–1.2)
Total Protein: 6.8 g/dL (ref 6.0–8.3)

## 2020-01-20 LAB — PSA, MEDICARE: PSA: 0.18 ng/ml (ref 0.10–4.00)

## 2020-01-20 LAB — LIPID PANEL
Cholesterol: 145 mg/dL (ref 0–200)
HDL: 63.6 mg/dL (ref >39.00)
LDL Cholesterol: 57 mg/dL (ref 0–99)
NonHDL: 81.64
Total CHOL/HDL Ratio: 2
Triglycerides: 122 mg/dL (ref 0.0–149.0)
VLDL: 24.4 mg/dL (ref 0.0–40.0)

## 2020-01-20 MED ORDER — ZOSTER VAC RECOMB ADJUVANTED 50 MCG/0.5ML IM SUSR: 0.5000 mL | Freq: Once | INTRAMUSCULAR | 0 refills | Status: AC

## 2020-01-20 MED ORDER — EPINEPHRINE 0.3 MG/0.3ML IJ SOAJ: 0.3000 mg | INTRAMUSCULAR | 1 refills | Status: DC | PRN

## 2020-01-20 MED ORDER — NITROGLYCERIN 0.4 MG SL SUBL: 0.4000 mg | SUBLINGUAL_TABLET | SUBLINGUAL | 0 refills | Status: DC | PRN

## 2020-01-20 NOTE — Progress Notes (Signed)
OFFICE VISIT  01/20/2020  CC:  Chief Complaint  Patient presents with  . Follow-up    RCI   HPI:    Patient is a 54 y.o. Caucasian male who presents for 6 mo f/u HTN, HLD, CAD, hx of recurrent DVT (with chronic anticoag). He has DM 2 managed by Dr. Everardo All in endocrinology. Anx/dep managed by Dr. Evelene Croon, psychiatrist.   A/P as of last visit: "1) HTN: The current medical regimen is effective;  continue present plan and medications. Lytes/cr today.  2) HLD: tolerating statin and fibrate.  3) Hx of recurrent DVT, now on anticoag with xarelto indefinitely. Also on plavix for hx of CAD with stent.  No sign of bleeding. CBC today.  4) DM: managed by Dr. Everardo All, good control.  5) CAD, hx of stent, preserved LV function, no angina. Continue cardiology f/u.  Cont plavix and statin."  INTERIM HX: Feeling well. Active but no formal exercise. Weed eats.  Does some push mowing lawn. No sign of bleeding.  Compliant with statin and fibrate and no s/e's.  No home bp monitoring but compliant with all meds.  No new complaints but requests renewed rx's for epi-pen (bees) and sl NTG (expired).  PMP AWARE reviewed today: most recent rx for diazepam was filled 12/16/19, # 90, rx by Dr. Evelene Croon (psychiatrist). No red flags.  ROS: no fevers, no CP, no SOB, no wheezing, no cough, no dizziness, no HAs, no rashes, no melena/hematochezia.  No polyuria or polydipsia.  No myalgias or arthralgias.  No focal weakness, paresthesias, or tremors.  No acute vision or hearing abnormalities. No n/v/d or abd pain.  No palpitations.    Past Medical History:  Diagnosis Date  . ANEMIA, PERNICIOUS 04/03/2007  . Anxiety and depression   . Arthritis    "left hand" (06/26/2016)  . Chronic lower back pain   . Colon cancer screening 10/27/2016   Cologuard NEG-->rpt 3 yrs  . CORONARY ARTERY DISEASE 12/05/2006  . DIABETES MELLITUS, TYPE II dx'd 2001  . DVT (deep venous thrombosis) (HCC) 2016   BLE  . GERD  (gastroesophageal reflux disease)   . History of blood transfusion 2001   "when I had my hand OR"  . History of gout   . History of kidney stones   . HYPERCHOLESTEROLEMIA 07/17/2007  . HYPERTENSION 12/05/2006  . Learning disability   . Migraine    "1-2/month" (06/26/2016)  . Myocardial infarction (HCC) ~ 2003  . Obesity, Class II, BMI 35-39.9   . OSA on CPAP    Pt cannot recall setting clearly but thinks it is 5 cm h20.    . PE (pulmonary embolism) 2016  . Stroke [facility]) ~ 2006   denies residual on 06/26/2016  . TIA (transient ischemic attack) ?06/25/2016  . VISUAL ACUITY, DECREASED, RIGHT EYE 02/19/2009    Past Surgical History:  Procedure Laterality Date  . CARDIAC CATHETERIZATION  1990s; 2012; 06/2016   06/2016 distal LAD DES stent.  EF 55-65%  . CARDIOVASCULAR STRESS TEST  06/2016   Echo stress test->"inconclusive"-->cath was done after this  . CATARACT EXTRACTION W/ INTRAOCULAR LENS IMPLANT Right 2017  . CORONARY STENT INTERVENTION N/A 06/27/2016   Procedure: Coronary Stent Intervention;  Surgeon: Marykay Lex, MD;  Location: [facility] INVASIVE CV LAB;  Service: Cardiovascular;  Laterality: N/A;  . EYE SURGERY Right    Cataract  . INGUINAL HERNIA REPAIR Bilateral 1990s  . INTRAVASCULAR PRESSURE WIRE/FFR STUDY N/A 06/27/2016   Procedure: Intravascular Pressure Wire/FFR Study;  Surgeon:  Marykay Lex, MD;  Location: [facility] INVASIVE CV LAB;  Service: Cardiovascular;  Laterality: N/A;  . LEFT HEART CATH AND CORONARY ANGIOGRAPHY N/A 06/27/2016   Procedure: Left Heart Cath and Coronary Angiography;  Surgeon: Marykay Lex, MD;  Location: [facility] INVASIVE CV LAB;  Service: Cardiovascular;  Laterality: N/A;  . REATTACHMENT HAND Left ~ 2001   w/carpal tunnel release  . UMBILICAL HERNIA REPAIR  1990s   w/IHR    Outpatient Medications Prior to Visit  Medication Sig Dispense Refill  . acetaminophen (TYLENOL) 500 MG tablet Take 1,000 mg by mouth every 6 (six) hours as needed.    . bromocriptine  (PARLODEL) 2.5 MG tablet TAKE 1 TABLET (2.5 MG TOTAL) BY MOUTH DAILY. TAKE 1/4 TABLET BY MOUTH EVERY DAY 45 tablet 1  . clopidogrel (PLAVIX) 75 MG tablet TAKE 1 TABLET BY MOUTH EVERY DAY 90 tablet 3  . Cyanocobalamin (VITAMIN B12 PO) Take by mouth daily.    . diazepam (VALIUM) 10 MG tablet Take 10 mg by mouth 3 (three) times daily.    . DULoxetine (CYMBALTA) 30 MG capsule Take 30 mg by mouth daily.      . fenofibrate (TRICOR) 145 MG tablet Take 1 tablet (145 mg total) by mouth daily. 90 tablet 1  . FLUoxetine (PROZAC) 40 MG capsule Take 40 mg by mouth daily.      . fluticasone (FLONASE) 50 MCG/ACT nasal spray USE ONE SPRAY IN EACH NOSTRIL TWICE A DAY 16 g 2  . hydrochlorothiazide (HYDRODIURIL) 12.5 MG tablet TAKE 1 TABLET BY MOUTH EVERY DAY WITH THE LOSARTAN 90 tablet 1  . JARDIANCE 25 MG TABS tablet TAKE 1 TABLET BY MOUTH EVERY DAY 90 tablet 1  . losartan (COZAAR) 100 MG tablet Take 0.5 tablets (50 mg total) by mouth daily. 45 tablet 1  . metFORMIN (GLUCOPHAGE-XR) 500 MG 24 hr tablet TAKE 2 TABLETS BY MOUTH TWICE A DAY 360 tablet 0  . nitroGLYCERIN (NITROSTAT) 0.4 MG SL tablet Place 1 tablet (0.4 mg total) under the tongue every 5 (five) minutes as needed for chest pain. 10 tablet 0  . nystatin cream (MYCOSTATIN) Apply to affected area 2 times daily 60 g 0  . ONETOUCH VERIO test strip USE TO CHECK BLOOD SUGAR 1 TIME PER DAY. DX CODE: E11.9 100 each 2  . OZEMPIC, 1 MG/DOSE, 2 MG/1.5ML SOPN INJECT 1 MG INTO THE SKIN ONCE A WEEK. 6 pen 3  . rosuvastatin (CRESTOR) 5 MG tablet TAKE 1 TABLET BY MOUTH EVERY DAY 90 tablet 2  . traMADol (ULTRAM) 50 MG tablet Take 1-2 tabs at bedtime prn pain 14 tablet 1  . XARELTO 20 MG TABS tablet TAKE 1 TABLET BY MOUTH EVERY DAY BEFORE SUPPER 90 tablet 1   No facility-administered medications prior to visit.    Allergies  Allergen Reactions  . Actos [Pioglitazone] Other (See Comments)    Potential cause of DVT  . Bee Venom Anaphylaxis  . Klonopin [Clonazepam]  Anaphylaxis  . Invokana [Canagliflozin] Nausea Only    Nausea/dizzy/bad taste in mouth  . Niacin Other (See Comments)    "makes his crazy"    ROS As per HPI  PE: Vitals with BMI 01/20/2020 09/03/2019 07/17/2019  Height - 5\' 10"  5\' 10"   Weight 237 lbs 10 oz 239 lbs 235 lbs 13 oz  BMI - 34.29 33.83  Systolic 119 104  Diastolic 79 70 86  Pulse 67 78 74     Gen: Alert, well appearing.  Patient is oriented  to person, place, time, and situation. AFFECT: pleasant, lucid thought and speech. CV: RRR, no m/r/g.   LUNGS: CTA bilat, nonlabored resps, good aeration in all lung fields. EXT: no clubbing or cyanosis.  no edema.    LABS:  Lab Results  Component Value Date   TSH 2.73 10/10/2017   Lab Results  Component Value Date   WBC 7.6 07/17/2019   HGB 15.3 07/17/2019   HCT 46.0 07/17/2019   MCV 92.1 07/17/2019   PLT 397.0 07/17/2019   Lab Results  Component Value Date   CREATININE 1.20 07/17/2019   BUN 19 07/17/2019   NA 136 07/17/2019   K 3.9 07/17/2019   CL 99 07/17/2019   CO2 27 07/17/2019   Lab Results  Component Value Date   ALT 17 01/17/2019   AST 20 01/17/2019   ALKPHOS 70 10/10/2017   BILITOT 0.5 01/17/2019   Lab Results  Component Value Date   CHOL 159 07/17/2019   Lab Results  Component Value Date   HDL 61.00 07/17/2019   Lab Results  Component Value Date   LDLCALC 68 07/17/2019   Lab Results  Component Value Date   TRIG 147.0 07/17/2019   Lab Results  Component Value Date   CHOLHDL 3 07/17/2019   Lab Results  Component Value Date   PSA 0.17 11/04/2018   PSA 0.26 10/10/2017   PSA 0.13 09/12/2016   Lab Results  Component Value Date   HGBA1C 6.7 (A) 09/03/2019    IMPRESSION AND PLAN:  1) HTN: The current medical regimen is effective;  continue present plan and medications. Lytes/cr today.  2) HLD: tolerating statin and fibrate. FLP and hepatic panel today.  3) Hx of recurrent DVT, on chronic OAC.  No sign of bleeding. CBC  today.  4) CAD, hx of stent.  Cont plavix and statin.  5) DM: as per Dr Everardo All.  6) Anx/dep: as per Dr. Evelene Croon.  7)  Preventative health: cologuard repeat is due for colon ca screening-->given today. Prostate ca screening: DRE today, PSA today. Flu vaccine given today. Tdap->rx to pharmacy today. Shingrix rx sent to pharmacy.  An After Visit Summary was printed and given to the patient.  FOLLOW UP: No follow-ups on file.  Signed:  Santiago Bumpers, MD           01/20/2020

## 2020-01-20 NOTE — Addendum Note (Signed)
Addended by: Paschal Dopp on: 01/20/2020 09:39 AM   Modules accepted: Orders

## 2020-01-20 NOTE — Addendum Note (Signed)
Addended by: Alysia Penna on: 01/20/2020 09:01 AM   Modules accepted: Orders

## 2020-01-21 NOTE — Progress Notes (Signed)
Pt notified of lab results

## 2020-01-26 ENCOUNTER — Other Ambulatory Visit: Payer: Self-pay | Admitting: Family Medicine

## 2020-02-02 ENCOUNTER — Other Ambulatory Visit: Payer: Self-pay | Admitting: Family Medicine

## 2020-02-27 ENCOUNTER — Ambulatory Visit (INDEPENDENT_AMBULATORY_CARE_PROVIDER_SITE_OTHER): Payer: Medicare Other | Admitting: Endocrinology

## 2020-02-27 ENCOUNTER — Other Ambulatory Visit: Payer: Self-pay

## 2020-02-27 VITALS — BP 128/80 | HR 83 | Ht 71.0 in | Wt 233.2 lb

## 2020-02-27 DIAGNOSIS — I2510 Atherosclerotic heart disease of native coronary artery without angina pectoris: Secondary | ICD-10-CM | POA: Diagnosis not present

## 2020-02-27 DIAGNOSIS — E119 Type 2 diabetes mellitus without complications: Secondary | ICD-10-CM

## 2020-02-27 NOTE — Progress Notes (Signed)
Subjective:    Patient ID: Xavier [name], male    DOB: Dec 28, 1965, 54 y.o.   MRN: [id number]  HPI Pt returns for f/u of diabetes mellitus:  DM type: 2 Dx'ed: 2005 Complications: CAD, CRI, and PN.   Therapy: Ozempic and 3 oral meds.   DKA: never Severe hypoglycemia: never.   Pancreatitis: never.  Other: he did not tolerate pioglitizone (edema); he has never taken insulin, except in the hospital.   Interval history: no cbg record, but states cbg's are well-controlled.  pt states he feels well in general.  He takes meds as rx'ed.    Past Medical History:  Diagnosis Date  . ANEMIA, PERNICIOUS 04/03/2007  . Anxiety and depression   . Arthritis    "left hand" (06/26/2016)  . Chronic lower back pain   . Colon cancer screening 10/27/2016   Cologuard NEG-->rpt 3 yrs  . CORONARY ARTERY DISEASE 12/05/2006  . DIABETES MELLITUS, TYPE II dx'd 2001  . DVT (deep venous thrombosis) (HCC) 2016   BLE  . GERD (gastroesophageal reflux disease)   . History of blood transfusion 2001   "when I had my hand OR"  . History of gout   . History of kidney stones   . HYPERCHOLESTEROLEMIA 07/17/2007  . HYPERTENSION 12/05/2006  . Learning disability   . Migraine    "1-2/month" (06/26/2016)  . Myocardial infarction (HCC) ~ 2003  . Obesity, Class II, BMI 35-39.9   . OSA on CPAP    Pt cannot recall setting clearly but thinks it is 5 cm h20.    . PE (pulmonary embolism) 2016  . Stroke [facility]) ~ 2006   denies residual on 06/26/2016  . TIA (transient ischemic attack) ?06/25/2016  . VISUAL ACUITY, DECREASED, RIGHT EYE 02/19/2009    Past Surgical History:  Procedure Laterality Date  . CARDIAC CATHETERIZATION  1990s; 2012; 06/2016   06/2016 distal LAD DES stent.  EF 55-65%  . CARDIOVASCULAR STRESS TEST  06/2016   Echo stress test->"inconclusive"-->cath was done after this  . CATARACT EXTRACTION W/ INTRAOCULAR LENS IMPLANT Right 2017  . CORONARY STENT INTERVENTION N/A 06/27/2016   Procedure: Coronary Stent  Intervention;  Surgeon: Marykay Lex, MD;  Location: [facility] INVASIVE CV LAB;  Service: Cardiovascular;  Laterality: N/A;  . EYE SURGERY Right    Cataract  . INGUINAL HERNIA REPAIR Bilateral 1990s  . INTRAVASCULAR PRESSURE WIRE/FFR STUDY N/A 06/27/2016   Procedure: Intravascular Pressure Wire/FFR Study;  Surgeon: Marykay Lex, MD;  Location: [facility] INVASIVE CV LAB;  Service: Cardiovascular;  Laterality: N/A;  . LEFT HEART CATH AND CORONARY ANGIOGRAPHY N/A 06/27/2016   Procedure: Left Heart Cath and Coronary Angiography;  Surgeon: Marykay Lex, MD;  Location: [facility] INVASIVE CV LAB;  Service: Cardiovascular;  Laterality: N/A;  . REATTACHMENT HAND Left ~ 2001   w/carpal tunnel release  . UMBILICAL HERNIA REPAIR  1990s   w/IHR    Social History   Socioeconomic History  . Marital status: Married    Spouse name: Not on file  . Number of children: Not on file  . Years of education: Not on file  . Highest education level: Not on file  Occupational History  . Occupation: Disabled    Employer: DISABLED  Tobacco Use  . Smoking status: Former Smoker    Packs/day: 0.10    Years: 3.00    Pack years: 0.30    Types: Cigarettes    Quit date: 05/15/1984    Years since quitting: 35.8  .  Smokeless tobacco: Never Used  Vaping Use  . Vaping Use: Never used  Substance and Sexual Activity  . Alcohol use: No  . Drug use: No  . Sexual activity: Not on file  Other Topics Concern  . Not on file  Social History Narrative   Married, has 2 sons, no grandchildren.   Orig from [place], born in [place].   Disabled due to left wrist injury at work.   Tobacco: none   Alc: none   Social Determinants of Health   Financial Resource Strain:   . Difficulty of Paying Living Expenses: Not on file  Food Insecurity:   . Worried About Programme researcher, broadcasting/film/video in the Last Year: Not on file  . Ran Out of Food in the Last Year: Not on file  Transportation Needs:   . Lack of Transportation (Medical): Not on file  .  Lack of Transportation (Non-Medical): Not on file  Physical Activity:   . Days of Exercise per Week: Not on file  . Minutes of Exercise per Session: Not on file  Stress:   . Feeling of Stress : Not on file  Social Connections:   . Frequency of Communication with Friends and Family: Not on file  . Frequency of Social Gatherings with Friends and Family: Not on file  . Attends Religious Services: Not on file  . Active Member of Clubs or Organizations: Not on file  . Attends Banker Meetings: Not on file  . Marital Status: Not on file  Intimate Partner Violence:   . Fear of Current or Ex-Partner: Not on file  . Emotionally Abused: Not on file  . Physically Abused: Not on file  . Sexually Abused: Not on file    Current Outpatient Medications on File Prior to Visit  Medication Sig Dispense Refill  . acetaminophen (TYLENOL) 500 MG tablet Take 1,000 mg by mouth every 6 (six) hours as needed.    . bromocriptine (PARLODEL) 2.5 MG tablet TAKE 1 TABLET (2.5 MG TOTAL) BY MOUTH DAILY. TAKE 1/4 TABLET BY MOUTH EVERY DAY 45 tablet 1  . clopidogrel (PLAVIX) 75 MG tablet TAKE 1 TABLET BY MOUTH EVERY DAY 90 tablet 3  . Cyanocobalamin (VITAMIN B12 PO) Take by mouth daily.    . diazepam (VALIUM) 10 MG tablet Take 10 mg by mouth 3 (three) times daily.    . DULoxetine (CYMBALTA) 30 MG capsule Take 30 mg by mouth daily.      Marland Kitchen EPINEPHrine 0.3 mg/0.3 mL IJ SOAJ injection Inject 0.3 mLs (0.3 mg total) into the muscle as needed for anaphylaxis. 2 each 1  . fenofibrate (TRICOR) 145 MG tablet TAKE 1 TABLET BY MOUTH EVERY DAY 90 tablet 1  . FLUoxetine (PROZAC) 40 MG capsule Take 40 mg by mouth daily.      . fluticasone (FLONASE) 50 MCG/ACT nasal spray USE ONE SPRAY IN EACH NOSTRIL TWICE A DAY 16 g 2  . hydrochlorothiazide (HYDRODIURIL) 12.5 MG tablet TAKE 1 TABLET BY MOUTH EVERY DAY WITH THE LOSARTAN 90 tablet 1  . JARDIANCE 25 MG TABS tablet TAKE 1 TABLET BY MOUTH EVERY DAY 90 tablet 1  . losartan  (COZAAR) 100 MG tablet Take 0.5 tablets (50 mg total) by mouth daily. 45 tablet 1  . metFORMIN (GLUCOPHAGE-XR) 500 MG 24 hr tablet TAKE 2 TABLETS BY MOUTH TWICE A DAY 360 tablet 0  . nitroGLYCERIN (NITROSTAT) 0.4 MG SL tablet Place 1 tablet (0.4 mg total) under the tongue every 5 (five) minutes  as needed for chest pain. 10 tablet 0  . nystatin cream (MYCOSTATIN) Apply to affected area 2 times daily 60 g 0  . ONETOUCH VERIO test strip USE TO CHECK BLOOD SUGAR 1 TIME PER DAY. DX CODE: E11.9 100 each 2  . OZEMPIC, 1 MG/DOSE, 2 MG/1.5ML SOPN INJECT 1 MG INTO THE SKIN ONCE A WEEK. 6 pen 3  . rosuvastatin (CRESTOR) 5 MG tablet TAKE 1 TABLET BY MOUTH EVERY DAY 90 tablet 2  . traMADol (ULTRAM) 50 MG tablet Take 1-2 tabs at bedtime prn pain 14 tablet 1  . XARELTO 20 MG TABS tablet TAKE 1 TABLET BY MOUTH EVERY DAY BEFORE SUPPER 90 tablet 1   No current facility-administered medications on file prior to visit.    Allergies  Allergen Reactions  . Actos [Pioglitazone] Other (See Comments)    Potential cause of DVT  . Bee Venom Anaphylaxis  . Klonopin [Clonazepam] Anaphylaxis  . Invokana [Canagliflozin] Nausea Only    Nausea/dizzy/bad taste in mouth  . Niacin Other (See Comments)    "makes his crazy"    Family History  Problem Relation Age of Onset  . COPD Mother   . Lymphoma Father     BP 128/80 (BP Location: Left Arm, Patient Position: Sitting, Cuff Size: Normal)   Pulse 83   Ht 5\' 11"  (1.803 m)   Wt 233 lb 3.2 oz (105.8 kg)   SpO2 97%   BMI 32.52 kg/m    Review of Systems Denies nausea.      Objective:   Physical Exam VITAL SIGNS:  See vs page GENERAL: no distress Pulses: dorsalis pedis intact bilat.   MSK: no deformity of the feet.   CV: no leg edema.   Skin:  no ulcer on the feet.  normal color and temp on the feet. Neuro: sensation is intact to touch on the feet.   Ext: there is bilateral onychomycosis of the toenails.    A1c=6.2%    Assessment & Plan:  Type 2 DM,  with CRI: well-controlled.  Please continue the same 4 diabetes medications  Patient Instructions  Please continue the same medications. check your blood sugar once a day.  vary the time of day when you check, between before the 3 meals, and at bedtime.  also check if you have symptoms of your blood sugar being too high or too low.  please keep a record of the readings and bring it to your next appointment here (or you can bring the meter itself).  You can write it on any piece of paper.  please call sooner if your blood sugar goes below 70, or if you have a lot of readings over 200.  Please come back for a follow-up appointment in 6 months.

## 2020-02-27 NOTE — Patient Instructions (Signed)
Please continue the same medications check your blood sugar once a day.  vary the time of day when you check, between before the 3 meals, and at bedtime.  also check if you have symptoms of your blood sugar being too high or too low.  please keep a record of the readings and bring it to your next appointment here (or you can bring the meter itself).  You can write it on any piece of paper.  please call us sooner if your blood sugar goes below 70, or if you have a lot of readings over 200. Please come back for a follow-up appointment in 6 months.   

## 2020-03-02 ENCOUNTER — Other Ambulatory Visit: Payer: Self-pay | Admitting: Endocrinology

## 2020-03-04 ENCOUNTER — Ambulatory Visit: Payer: Medicare Other | Admitting: Endocrinology

## 2020-03-09 ENCOUNTER — Other Ambulatory Visit: Payer: Self-pay | Admitting: Endocrinology

## 2020-04-12 ENCOUNTER — Other Ambulatory Visit: Payer: Self-pay | Admitting: Family Medicine

## 2020-04-12 ENCOUNTER — Telehealth: Payer: Self-pay | Admitting: Family Medicine

## 2020-04-12 ENCOUNTER — Other Ambulatory Visit: Payer: Self-pay

## 2020-04-12 ENCOUNTER — Encounter: Payer: Self-pay | Admitting: Family Medicine

## 2020-04-12 ENCOUNTER — Ambulatory Visit (INDEPENDENT_AMBULATORY_CARE_PROVIDER_SITE_OTHER): Payer: Medicare Other | Admitting: Family Medicine

## 2020-04-12 VITALS — BP 121/77 | HR 68 | Temp 97.6°F | Resp 16 | Ht 71.0 in | Wt 244.4 lb

## 2020-04-12 DIAGNOSIS — N481 Balanitis: Secondary | ICD-10-CM | POA: Diagnosis not present

## 2020-04-12 DIAGNOSIS — I2510 Atherosclerotic heart disease of native coronary artery without angina pectoris: Secondary | ICD-10-CM | POA: Diagnosis not present

## 2020-04-12 DIAGNOSIS — Z789 Other specified health status: Secondary | ICD-10-CM

## 2020-04-12 DIAGNOSIS — R21 Rash and other nonspecific skin eruption: Secondary | ICD-10-CM | POA: Diagnosis not present

## 2020-04-12 MED ORDER — FLUCONAZOLE 150 MG PO TABS: ORAL_TABLET | ORAL | 1 refills | Status: DC

## 2020-04-12 MED ORDER — NYSTATIN 100000 UNIT/GM EX POWD: 1.0000 "application " | Freq: Three times a day (TID) | CUTANEOUS | 1 refills | Status: DC

## 2020-04-12 NOTE — Telephone Encounter (Signed)
Please advise, thanks.

## 2020-04-12 NOTE — Telephone Encounter (Signed)
Patient states pharmacy cannot fill the Nystatin powder because insurance will not pay for it. Please change RX to cream.

## 2020-04-12 NOTE — Progress Notes (Signed)
OFFICE VISIT  04/12/2020  CC:  Chief Complaint  Patient presents with  . Yeast infection    x1 week, last occurred 3-4 years ago and was seen by Dr.Ellison and given Rx for pills and cream. Unable to recall name    HPI:    Patient is a 53 y.o. Caucasian male who presents for "yeast infection". About 2-3 mo hx rash in GU region, just recently started getting worse, some painful cracks in skin lately. Hx of this in remote past and treated with diflucan and nystatin and got signif better.    Past Medical History:  Diagnosis Date  . ANEMIA, PERNICIOUS 04/03/2007  . Anxiety and depression   . Arthritis    "left hand" (06/26/2016)  . Chronic lower back pain   . Colon cancer screening 10/27/2016   Cologuard NEG-->rpt 3 yrs  . CORONARY ARTERY DISEASE 12/05/2006  . DIABETES MELLITUS, TYPE II dx'd 2001  . DVT (deep venous thrombosis) (HCC) 2016   BLE  . GERD (gastroesophageal reflux disease)   . History of blood transfusion 2001   "when I had my hand OR"  . History of gout   . History of kidney stones   . HYPERCHOLESTEROLEMIA 07/17/2007  . HYPERTENSION 12/05/2006  . Learning disability   . Migraine    "1-2/month" (06/26/2016)  . Myocardial infarction (HCC) ~ 2003  . Obesity, Class II, BMI 35-39.9   . OSA on CPAP    Pt cannot recall setting clearly but thinks it is 5 cm h20.    . PE (pulmonary embolism) 2016  . Stroke [facility]) ~ 2006   denies residual on 06/26/2016  . TIA (transient ischemic attack) ?06/25/2016  . VISUAL ACUITY, DECREASED, RIGHT EYE 02/19/2009    Past Surgical History:  Procedure Laterality Date  . CARDIAC CATHETERIZATION  1990s; 2012; 06/2016   06/2016 distal LAD DES stent.  EF 55-65%  . CARDIOVASCULAR STRESS TEST  06/2016   Echo stress test->"inconclusive"-->cath was done after this  . CATARACT EXTRACTION W/ INTRAOCULAR LENS IMPLANT Right 2017  . CORONARY STENT INTERVENTION N/A 06/27/2016   Procedure: Coronary Stent Intervention;  Surgeon: Marykay Lex, MD;   Location: [facility] INVASIVE CV LAB;  Service: Cardiovascular;  Laterality: N/A;  . EYE SURGERY Right    Cataract  . INGUINAL HERNIA REPAIR Bilateral 1990s  . INTRAVASCULAR PRESSURE WIRE/FFR STUDY N/A 06/27/2016   Procedure: Intravascular Pressure Wire/FFR Study;  Surgeon: Marykay Lex, MD;  Location: [facility] INVASIVE CV LAB;  Service: Cardiovascular;  Laterality: N/A;  . LEFT HEART CATH AND CORONARY ANGIOGRAPHY N/A 06/27/2016   Procedure: Left Heart Cath and Coronary Angiography;  Surgeon: Marykay Lex, MD;  Location: [facility] INVASIVE CV LAB;  Service: Cardiovascular;  Laterality: N/A;  . REATTACHMENT HAND Left ~ 2001   w/carpal tunnel release  . UMBILICAL HERNIA REPAIR  1990s   w/IHR    Outpatient Medications Prior to Visit  Medication Sig Dispense Refill  . acetaminophen (TYLENOL) 500 MG tablet Take 1,000 mg by mouth every 6 (six) hours as needed.    . bromocriptine (PARLODEL) 2.5 MG tablet TAKE 1 TABLET (2.5 MG TOTAL) BY MOUTH DAILY. TAKE 1/4 TABLET BY MOUTH EVERY DAY 45 tablet 1  . clopidogrel (PLAVIX) 75 MG tablet TAKE 1 TABLET BY MOUTH EVERY DAY 90 tablet 3  . Cyanocobalamin (VITAMIN B12 PO) Take by mouth daily.    . diazepam (VALIUM) 10 MG tablet Take 10 mg by mouth 3 (three) times daily.    . DULoxetine (CYMBALTA)  30 MG capsule Take 30 mg by mouth daily.      . fenofibrate (TRICOR) 145 MG tablet TAKE 1 TABLET BY MOUTH EVERY DAY 90 tablet 1  . FLUoxetine (PROZAC) 40 MG capsule Take 40 mg by mouth daily.      . hydrochlorothiazide (HYDRODIURIL) 12.5 MG tablet TAKE 1 TABLET BY MOUTH EVERY DAY WITH THE LOSARTAN 90 tablet 1  . JARDIANCE 25 MG TABS tablet TAKE 1 TABLET BY MOUTH EVERY DAY 90 tablet 1  . losartan (COZAAR) 100 MG tablet Take 0.5 tablets (50 mg total) by mouth daily. 45 tablet 1  . metFORMIN (GLUCOPHAGE-XR) 500 MG 24 hr tablet TAKE 2 TABLETS BY MOUTH TWICE A DAY 360 tablet 0  . ONETOUCH VERIO test strip USE TO CHECK BLOOD SUGAR 1 TIME PER DAY. DX CODE: E11.9 100 each 2  . OZEMPIC, 1  MG/DOSE, 2 MG/1.5ML SOPN INJECT 1 MG INTO THE SKIN ONCE A WEEK. 6 pen 3  . rosuvastatin (CRESTOR) 5 MG tablet TAKE 1 TABLET BY MOUTH EVERY DAY 90 tablet 2  . traMADol (ULTRAM) 50 MG tablet Take 1-2 tabs at bedtime prn pain 14 tablet 1  . XARELTO 20 MG TABS tablet TAKE 1 TABLET BY MOUTH EVERY DAY BEFORE SUPPER 90 tablet 1  . EPINEPHrine 0.3 mg/0.3 mL IJ SOAJ injection Inject 0.3 mLs (0.3 mg total) into the muscle as needed for anaphylaxis. (Patient not taking: Reported on 04/12/2020) 2 each 1  . fluticasone (FLONASE) 50 MCG/ACT nasal spray USE ONE SPRAY IN EACH NOSTRIL TWICE A DAY (Patient not taking: Reported on 04/12/2020) 16 g 2  . nitroGLYCERIN (NITROSTAT) 0.4 MG SL tablet Place 1 tablet (0.4 mg total) under the tongue every 5 (five) minutes as needed for chest pain. (Patient not taking: Reported on 04/12/2020) 10 tablet 0  . nystatin cream (MYCOSTATIN) Apply to affected area 2 times daily (Patient not taking: Reported on 04/12/2020) 60 g 0   No facility-administered medications prior to visit.    Allergies  Allergen Reactions  . Actos [Pioglitazone] Other (See Comments)    Potential cause of DVT  . Bee Venom Anaphylaxis  . Klonopin [Clonazepam] Anaphylaxis  . Invokana [Canagliflozin] Nausea Only    Nausea/dizzy/bad taste in mouth  . Niacin Other (See Comments)    "makes his crazy"    ROS As per HPI  PE: Vitals with BMI 04/12/2020 02/27/2020 01/20/2020  Height 5\' 11"  5\' 11"  -  Weight 244 lbs 6 oz 233 lbs 3 oz 237 lbs 10 oz  BMI 34.1 32.54 -  Systolic 121 128  Diastolic 77 80 79  Pulse 68 83 67     Gen: Alert, well appearing.  Patient is oriented to person, place, time, and situation. AFFECT: pleasant, lucid thought and speech. GU: no distinct rash.  Moisture noted on penile shaft, foreskin, and glans. He is not circumcized.  He is able to retract foreskin but it is painful. A few light pink superficial skin fissures are noted on shaft and around urethral  meatus.   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 He is on jardiance, which can potentiate this. This has been a chronic/recurring problem. Will treat with once a week dose of diflucan 150mg  plus nystatin powder tid and ask urology to see him--? Would circumcision help?  An After Visit Summary was printed and given to the patient.  FOLLOW UP: No follow-ups on file.  Signed:  , MD           04/12/2020

## 2020-04-13 MED ORDER — NYSTATIN 100000 UNIT/GM EX CREA: 1.0000 "application " | TOPICAL_CREAM | Freq: Two times a day (BID) | CUTANEOUS | 1 refills | Status: DC

## 2020-04-13 NOTE — Telephone Encounter (Signed)
Verified with pharmacy that Rx received due to e-prescribing error message earlier. Tried to contact patient regarding this, unable to leave message.

## 2020-04-13 NOTE — Telephone Encounter (Signed)
Nystatin changed to cream

## 2020-04-22 ENCOUNTER — Telehealth: Payer: Self-pay | Admitting: Family Medicine

## 2020-04-22 NOTE — Telephone Encounter (Signed)
Left message for patient to schedule Annual Wellness Visit.  Please schedule with Nurse Health Advisor Martha Stanley, RN at [facility] [place]  °

## 2020-04-27 NOTE — Progress Notes (Signed)
Subjective:   Xavier [name] is a 54 y.o. male who presents for Medicare Annual/Subsequent preventive examination.  I connected with Xavier [name] today by telephone and verified that I am speaking with the correct person using two identifiers. Location patient: home Location provider: work Persons participating in the virtual visit: patient, Marine scientist.    I discussed the limitations, risks, security and privacy concerns of performing an evaluation and management service by telephone and the availability of in person appointments. I also discussed with the patient that there may be a patient responsible charge related to this service. The patient expressed understanding and verbally consented to this telephonic visit.    Interactive audio and video telecommunications were attempted between this provider and patient, however failed, due to patient having technical difficulties OR patient did not have access to video capability.  We continued and completed visit with audio only.  Some vital signs may be absent or patient reported.   Time Spent with patient on telephone encounter: 30 minutes   Review of Systems     Cardiac Risk Factors include: advanced age (>45mn, >>26women);male gender;diabetes mellitus;dyslipidemia;hypertension;obesity (BMI >30kg/m2);sedentary lifestyle     Objective:    Today's Vitals   04/28/20 1415  Weight: 244 lb (110.7 kg)  Height: 5' 11"  (1.803 m)  PainSc: 7    Body mass index is 34.03 kg/m.  Advanced Directives 04/28/2020 10/10/2017 06/05/2017 05/13/2017 06/26/2016 11/11/2015 09/29/2015  Does Patient Have a Medical Advance Directive? No No No No No No Yes  Would patient like information on creating a medical advance directive? No - Patient declined - - - No - Patient declined - -  Pre-existing out of facility DNR order (yellow form or pink MOST form) - - - - - - -    Current Medications (verified) Outpatient Encounter Medications as of 04/28/2020  Medication Sig   . acetaminophen (TYLENOL) 500 MG tablet Take 1,000 mg by mouth every 6 (six) hours as needed.  . bromocriptine (PARLODEL) 2.5 MG tablet TAKE 1 TABLET (2.5 MG TOTAL) BY MOUTH DAILY. TAKE 1/4 TABLET BY MOUTH EVERY DAY  . clopidogrel (PLAVIX) 75 MG tablet TAKE 1 TABLET BY MOUTH EVERY DAY  . Cyanocobalamin (VITAMIN B12 PO) Take by mouth daily.  . diazepam (VALIUM) 10 MG tablet Take 10 mg by mouth 3 (three) times daily.  . DULoxetine (CYMBALTA) 30 MG capsule Take 30 mg by mouth daily.  . fenofibrate (TRICOR) 145 MG tablet TAKE 1 TABLET BY MOUTH EVERY DAY  . fluconazole (DIFLUCAN) 150 MG tablet 1 tab po every 7 days  . FLUoxetine (PROZAC) 40 MG capsule Take 40 mg by mouth daily.  . hydrochlorothiazide (HYDRODIURIL) 12.5 MG tablet TAKE 1 TABLET BY MOUTH EVERY DAY WITH THE LOSARTAN  . JARDIANCE 25 MG TABS tablet TAKE 1 TABLET BY MOUTH EVERY DAY  . losartan (COZAAR) 100 MG tablet Take 0.5 tablets (50 mg total) by mouth daily.  . metFORMIN (GLUCOPHAGE-XR) 500 MG 24 hr tablet TAKE 2 TABLETS BY MOUTH TWICE A DAY  . nystatin cream (MYCOSTATIN) Apply 1 application topically 2 (two) times daily.  .Glory RosebushVERIO test strip USE TO CHECK BLOOD SUGAR 1 TIME PER DAY. DX CODE: E11.9  . OZEMPIC, 1 MG/DOSE, 2 MG/1.5ML SOPN INJECT 1 MG INTO THE SKIN ONCE A WEEK.  . rosuvastatin (CRESTOR) 5 MG tablet TAKE 1 TABLET BY MOUTH EVERY DAY  . traMADol (ULTRAM) 50 MG tablet Take 1-2 tabs at bedtime prn pain  . XARELTO 20 MG TABS tablet  TAKE 1 TABLET BY MOUTH EVERY DAY BEFORE SUPPER  . EPINEPHrine 0.3 mg/0.3 mL IJ SOAJ injection Inject 0.3 mLs (0.3 mg total) into the muscle as needed for anaphylaxis. (Patient not taking: No sig reported)  . fluticasone (FLONASE) 50 MCG/ACT nasal spray USE ONE SPRAY IN EACH NOSTRIL TWICE A DAY (Patient not taking: No sig reported)  . nitroGLYCERIN (NITROSTAT) 0.4 MG SL tablet Place 1 tablet (0.4 mg total) under the tongue every 5 (five) minutes as needed for chest pain. (Patient not taking:  No sig reported)   No facility-administered encounter medications on file as of 04/28/2020.    Allergies (verified) Actos [pioglitazone], Bee venom, Klonopin [clonazepam], Invokana [canagliflozin], and Niacin   History: Past Medical History:  Diagnosis Date  . ANEMIA, PERNICIOUS 04/03/2007  . Anxiety and depression   . Arthritis    "left hand" (06/26/2016)  . Chronic lower back pain   . Colon cancer screening 10/27/2016   Cologuard NEG-->rpt 3 yrs  . CORONARY ARTERY DISEASE 12/05/2006  . DIABETES MELLITUS, TYPE II dx'd 2001  . DVT (deep venous thrombosis) ([place]) 2016   BLE  . GERD (gastroesophageal reflux disease)   . History of blood transfusion 2001   "when I had my hand OR"  . History of gout   . History of kidney stones   . HYPERCHOLESTEROLEMIA 07/17/2007  . HYPERTENSION 12/05/2006  . Learning disability   . Migraine    "1-2/month" (06/26/2016)  . Myocardial infarction ([place]) ~ 2003  . Obesity, Class II, BMI 35-39.9   . OSA on CPAP    Pt cannot recall setting clearly but thinks it is 5 cm h20.    . PE (pulmonary embolism) 2016  . Stroke [facility]) ~ 2006   denies residual on 06/26/2016  . TIA (transient ischemic attack) ?06/25/2016  . VISUAL ACUITY, DECREASED, RIGHT EYE 02/19/2009   Past Surgical History:  Procedure Laterality Date  . CARDIAC CATHETERIZATION  1990s; 2012; 06/2016   06/2016 distal LAD DES stent.  EF 55-65%  . CARDIOVASCULAR STRESS TEST  06/2016   Echo stress test->"inconclusive"-->cath was done after this  . CATARACT EXTRACTION W/ INTRAOCULAR LENS IMPLANT Right 2017  . CORONARY STENT INTERVENTION N/A 06/27/2016   Procedure: Coronary Stent Intervention;  Surgeon: Leonie Man, MD;  Location: [place] CV LAB;  Service: Cardiovascular;  Laterality: N/A;  . EYE SURGERY Right    Cataract  . INGUINAL HERNIA REPAIR Bilateral 1990s  . INTRAVASCULAR PRESSURE WIRE/FFR STUDY N/A 06/27/2016   Procedure: Intravascular Pressure Wire/FFR Study;  Surgeon: Leonie Man,  MD;  Location: [facility] CV LAB;  Service: Cardiovascular;  Laterality: N/A;  . LEFT HEART CATH AND CORONARY ANGIOGRAPHY N/A 06/27/2016   Procedure: Left Heart Cath and Coronary Angiography;  Surgeon: Leonie Man, MD;  Location: [facility] CV LAB;  Service: Cardiovascular;  Laterality: N/A;  . REATTACHMENT HAND Left ~ 2001   w/carpal tunnel release  . UMBILICAL HERNIA REPAIR  1990s   w/IHR   Family History  Problem Relation Age of Onset  . COPD Mother   . Lymphoma Father    Social History   Socioeconomic History  . Marital status: Married    Spouse name: Not on file  . Number of children: Not on file  . Years of education: Not on file  . Highest education level: Not on file  Occupational History  . Occupation: Disabled    Employer: DISABLED  Tobacco Use  . Smoking status: Former Smoker  Packs/day: 0.10    Years: 3.00    Pack years: 0.30    Types: Cigarettes    Quit date: 05/15/1984    Years since quitting: 35.9  . Smokeless tobacco: Never Used  Vaping Use  . Vaping Use: Never used  Substance and Sexual Activity  . Alcohol use: No  . Drug use: No  . Sexual activity: Not on file  Other Topics Concern  . Not on file  Social History Narrative   Married, has 2 sons, no grandchildren.   Orig from [place], born in [place].   Disabled due to left wrist injury at work.   Tobacco: none   Alc: none   Social Determinants of Health   Financial Resource Strain: Low Risk   . Difficulty of Paying Living Expenses: Not hard at all  Food Insecurity: No Food Insecurity  . Worried About Charity fundraiser in the Last Year: Never true  . Ran Out of Food in the Last Year: Never true  Transportation Needs: No Transportation Needs  . Lack of Transportation (Medical): No  . Lack of Transportation (Non-Medical): No  Physical Activity: Inactive  . Days of Exercise per Week: 0 days  . Minutes of Exercise per Session: 0 min  Stress: No Stress Concern Present  . Feeling of  Stress : Not at all  Social Connections: Moderately Integrated  . Frequency of Communication with Friends and Family: More than three times a week  . Frequency of Social Gatherings with Friends and Family: More than three times a week  . Attends Religious Services: Never  . Active Member of Clubs or Organizations: Yes  . Attends Archivist Meetings: 1 to 4 times per year  . Marital Status: Married    Tobacco Counseling Counseling given: Not Answered   Clinical Intake:  Pre-visit preparation completed: Yes  Pain : 0-10 Pain Score: 7  Pain Type: Chronic pain Pain Location: Back Pain Onset: More than a month ago Pain Frequency: Intermittent     Nutritional Status: BMI > 30  Obese Nutritional Risks: None Diabetes: Yes CBG done?: No Did pt. bring in CBG monitor from home?: No (phone visit)  How often do you need to have someone help you when you read instructions, pamphlets, or other written materials from your doctor or pharmacy?: 1 - Never What is the last grade level you completed in school?: 12th grade  Diabetes:  Is the patient diabetic?  Yes  If diabetic, was a CBG obtained today?  No  Did the patient bring in their glucometer from home?  No phone visit How often do you monitor your CBG's? daily.   Financial Strains and Diabetes Management:  Are you having any financial strains with the device, your supplies or your medication? No .  Does the patient want to be seen by Chronic Care Management for management of their diabetes?  No  Would the patient like to be referred to a Nutritionist or for Diabetic Management?  No   Diabetic Exams:  Diabetic Eye Exam:. Overdue for diabetic eye exam. Pt has been advised about the importance in completing this exam. Patient to make an appt soon.  Diabetic Foot Exam: Completed 02/27/2020.    Interpreter Needed?: No  Information entered by :: Caroleen Hamman LPN   Activities of Daily Living In your present state  of health, do you have any difficulty performing the following activities: 04/28/2020  Hearing? N  Vision? N  Difficulty concentrating or making decisions? N  Walking or climbing stairs? N  Dressing or bathing? N  Doing errands, shopping? N  Preparing Food and eating ? N  Using the Toilet? N  In the past six months, have you accidently leaked urine? N  Do you have problems with loss of bowel control? N  Managing your Medications? N  Managing your Finances? N  Housekeeping or managing your Housekeeping? N  Some recent data might be hidden    Patient Care Team: Tammi Sou, MD as PCP - General (Family Medicine) Josue Hector, MD as PCP - Cardiology (Cardiology) [name], MD as Consulting Physician (Endocrinology) Chucky May, MD as Consulting Physician (Psychiatry) Tanda Rockers, MD as Consulting Physician (Pulmonary Disease) Chesley Mires, MD as Consulting Physician (Pulmonary Disease)  Indicate any recent Medical Services you may have received from other than [facility] providers in the past year (date may be approximate).     Assessment:   This is a routine wellness examination for [place].  Hearing/Vision screen  Hearing Screening   125Hz  250Hz  500Hz  1000Hz  2000Hz  3000Hz  4000Hz  6000Hz  8000Hz   Right ear:           Left ear:           Comments: No issues  Vision Screening Comments: Cataract removed from right eye Last eye exam-date unknown   Dietary issues and exercise activities discussed: Current Exercise Habits: The patient does not participate in regular exercise at present, Exercise limited by: None identified  Goals    . Patient Stated     Increase activity, lose some weight & do more activities for brain stimulation.      Depression Screen PHQ 2/9 Scores 04/28/2020  PHQ - 2 Score 0    Fall Risk Fall Risk  04/28/2020  Falls in the past year? 0  Number falls in past yr: 0  Injury with Fall? 0  Follow up Falls prevention discussed    FALL  RISK PREVENTION PERTAINING TO THE HOME:  Any stairs in or around the home? No  Home free of loose throw rugs in walkways, pet beds, electrical cords, etc? Yes  Adequate lighting in your home to reduce risk of falls? Yes   ASSISTIVE DEVICES UTILIZED TO PREVENT FALLS:  Life alert? No  Use of a cane, walker or w/c? No  Grab bars in the bathroom? Yes  Shower chair or bench in shower? No  Elevated toilet seat or a handicapped toilet? No   TIMED UP AND GO:  Was the test performed? No . Phone visit   Cognitive Function:Normal cognitive status assessed by  this Nurse Health Advisor. No abnormalities found.          Immunizations Immunization History  Administered Date(s) Administered  . Influenza,inj,Quad PF,6+ Mos 06/23/2015, 01/10/2018, 01/17/2019, 01/20/2020  . PFIZER SARS-COV-2 Vaccination 12/06/2019, 12/27/2019  . Pneumococcal Polysaccharide-23 10/07/2010  . Td 12/17/2008  . Tdap 01/17/2019, 01/22/2020    TDAP status: Up to date  Flu Vaccine status: Up to date  Pneumococcal vaccine status: Up to date  Covid-19 vaccine status: Completed vaccines  Qualifies for Shingles Vaccine? Yes   Zostavax completed No   Shingrix Completed?: No.    Education has been provided regarding the importance of this vaccine. Patient has been advised to call insurance company to determine out of pocket expense if they have not yet received this vaccine. Advised may also receive vaccine at local pharmacy or Health Dept. Verbalized acceptance and understanding.  Screening Tests Health Maintenance  Topic Date Due  .  Hepatitis C Screening  Never done  . OPHTHALMOLOGY EXAM  09/21/2015  . Fecal DNA (Cologuard)  10/28/2019  . HEMOGLOBIN A1C  03/04/2020  . COVID-19 Vaccine (3 - Booster for Pfizer series) 06/28/2020  . FOOT EXAM  02/26/2021  . TETANUS/TDAP  01/21/2030  . INFLUENZA VACCINE  Completed  . PNEUMOCOCCAL POLYSACCHARIDE VACCINE AGE 65-64 HIGH RISK  Completed  . HIV Screening   Discontinued    Health Maintenance  Health Maintenance Due  Topic Date Due  . Hepatitis C Screening  Never done  . OPHTHALMOLOGY EXAM  09/21/2015  . Fecal DNA (Cologuard)  10/28/2019  . HEMOGLOBIN A1C  03/04/2020    Colorectal cancer screening:Cologuard ordered by PCP. Patient states he has received kit in the mail but has not collected specimen yet. He plans to do so soon.  Lung Cancer Screening: (Low Dose CT Chest recommended if Age 60-80 years, 30 pack-year currently smoking OR have quit w/in 15years.) does not qualify.     Additional Screening:  Hepatitis C Screening: does not qualify  Vision Screening: Recommended annual ophthalmology exams for early detection of glaucoma and other disorders of the eye. Is the patient up to date with their annual eye exam?  No  Who is the provider or what is the name of the office in which the patient attends annual eye exams? Unsure of name   Dental Screening: Recommended annual dental exams for proper oral hygiene  Community Resource Referral / Chronic Care Management: CRR required this visit?  No   CCM required this visit?  No      Plan:     I have personally reviewed and noted the following in the patient's chart:   . Medical and social history . Use of alcohol, tobacco or illicit drugs  . Current medications and supplements . Functional ability and status . Nutritional status . Physical activity . Advanced directives . List of other physicians . Hospitalizations, surgeries, and ER visits in previous 12 months . Vitals . Screenings to include cognitive, depression, and falls . Referrals and appointments  In addition, I have reviewed and discussed with patient certain preventive protocols, quality metrics, and best practice recommendations. A written personalized care plan for preventive services as well as general preventive health recommendations were provided to patient.   Due to this being a telephonic visit, the  after visit summary with patients personalized plan was offered to patient via mail or my-chart.  Patient would like to access on my-chart.    Marta Antu, LPN   [id number]  Nurse Health Advisor  Nurse Notes: None

## 2020-04-28 ENCOUNTER — Ambulatory Visit (INDEPENDENT_AMBULATORY_CARE_PROVIDER_SITE_OTHER): Payer: Medicare Other

## 2020-04-28 VITALS — Ht 71.0 in | Wt 244.0 lb

## 2020-04-28 DIAGNOSIS — Z0000 Encounter for general adult medical examination without abnormal findings: Secondary | ICD-10-CM | POA: Diagnosis not present

## 2020-04-28 NOTE — Patient Instructions (Signed)
Xavier [name] , Thank you for taking time to complete your Medicare Wellness Visit. I appreciate your ongoing commitment to your health goals. Please review the following plan we discussed and let me know if I can assist you in the future.   Screening recommendations/referrals: Colonoscopy: Cologuard Due. Please collect specimen & send in per instructions in the kit you received. Recommended yearly ophthalmology/optometry visit for glaucoma screening and checkup Recommended yearly dental visit for hygiene and checkup  Vaccinations: Influenza vaccine: Up to date Pneumococcal vaccine: Up to date Tdap vaccine: Up to date-Due-01/21/2030 Shingles vaccine: Completed  First dose.  Covid-19: Completed vaccines  Advanced directives: Declined information today.  Conditions/risks identified: See problem list  Next appointment: Follow up in one year for your annual wellness visit 05/04/2021 @ 2:15pm.  Preventive Care 40-64 Years, Male Preventive care refers to lifestyle choices and visits with your health care provider that can promote health and wellness. What does preventive care include?  A yearly physical exam. This is also called an annual well check.  Dental exams once or twice a year.  Routine eye exams. Ask your health care provider how often you should have your eyes checked.  Personal lifestyle choices, including:  Daily care of your teeth and gums.  Regular physical activity.  Eating a healthy diet.  Avoiding tobacco and drug use.  Limiting alcohol use.  Practicing safe sex.  Taking low-dose aspirin every day starting at age 2. What happens during an annual well check? The services and screenings done by your health care provider during your annual well check will depend on your age, overall health, lifestyle risk factors, and family history of disease. Counseling  Your health care provider may ask you questions about your:  Alcohol use.  Tobacco use.  Drug  use.  Emotional well-being.  Home and relationship well-being.  Sexual activity.  Eating habits.  Work and work Statistician. Screening  You may have the following tests or measurements:  Height, weight, and BMI.  Blood pressure.  Lipid and cholesterol levels. These may be checked every 5 years, or more frequently if you are over 10 years old.  Skin check.  Lung cancer screening. You may have this screening every year starting at age 90 if you have a 30-pack-year history of smoking and currently smoke or have quit within the past 15 years.  Fecal occult blood test (FOBT) of the stool. You may have this test every year starting at age 29.  Flexible sigmoidoscopy or colonoscopy. You may have a sigmoidoscopy every 5 years or a colonoscopy every 10 years starting at age 53.  Prostate cancer screening. Recommendations will vary depending on your family history and other risks.  Hepatitis C blood test.  Hepatitis B blood test.  Sexually transmitted disease (STD) testing.  Diabetes screening. This is done by checking your blood sugar (glucose) after you have not eaten for a while (fasting). You may have this done every 1-3 years. Discuss your test results, treatment options, and if necessary, the need for more tests with your health care provider. Vaccines  Your health care provider may recommend certain vaccines, such as:  Influenza vaccine. This is recommended every year.  Tetanus, diphtheria, and acellular pertussis (Tdap, Td) vaccine. You may need a Td booster every 10 years.  Zoster vaccine. You may need this after age 24.  Pneumococcal 13-valent conjugate (PCV13) vaccine. You may need this if you have certain conditions and have not been vaccinated.  Pneumococcal polysaccharide (PPSV23) vaccine. You may  need one or two doses if you smoke cigarettes or if you have certain conditions. Talk to your health care provider about which screenings and vaccines you need and how  often you need them. This information is not intended to replace advice given to you by your health care provider. Make sure you discuss any questions you have with your health care provider. Document Released: 05/28/2015 Document Revised: 01/19/2016 Document Reviewed: 03/02/2015 Elsevier Interactive Patient Education  2017 [place] Prevention in the Home Falls can cause injuries. They can happen to people of all ages. There are many things you can do to make your home safe and to help prevent falls. What can I do on the outside of my home?  Regularly fix the edges of walkways and driveways and fix any cracks.  Remove anything that might make you trip as you walk through a door, such as a raised step or threshold.  Trim any bushes or trees on the path to your home.  Use bright outdoor lighting.  Clear any walking paths of anything that might make someone trip, such as rocks or tools.  Regularly check to see if handrails are loose or broken. Make sure that both sides of any steps have handrails.  Any raised decks and porches should have guardrails on the edges.  Have any leaves, snow, or ice cleared regularly.  Use sand or salt on walking paths during winter.  Clean up any spills in your garage right away. This includes oil or grease spills. What can I do in the bathroom?  Use night lights.  Install grab bars by the toilet and in the tub and shower. Do not use towel bars as grab bars.  Use non-skid mats or decals in the tub or shower.  If you need to sit down in the shower, use a plastic, non-slip stool.  Keep the floor dry. Clean up any water that spills on the floor as soon as it happens.  Remove soap buildup in the tub or shower regularly.  Attach bath mats securely with double-sided non-slip rug tape.  Do not have throw rugs and other things on the floor that can make you trip. What can I do in the bedroom?  Use night lights.  Make sure that you have a  light by your bed that is easy to reach.  Do not use any sheets or blankets that are too big for your bed. They should not hang down onto the floor.  Have a firm chair that has side arms. You can use this for support while you get dressed.  Do not have throw rugs and other things on the floor that can make you trip. What can I do in the kitchen?  Clean up any spills right away.  Avoid walking on wet floors.  Keep items that you use a lot in easy-to-reach places.  If you need to reach something above you, use a strong step stool that has a grab bar.  Keep electrical cords out of the way.  Do not use floor polish or wax that makes floors slippery. If you must use wax, use non-skid floor wax.  Do not have throw rugs and other things on the floor that can make you trip. What can I do with my stairs?  Do not leave any items on the stairs.  Make sure that there are handrails on both sides of the stairs and use them. Fix handrails that are broken or loose. Make sure  that handrails are as long as the stairways.  Check any carpeting to make sure that it is firmly attached to the stairs. Fix any carpet that is loose or worn.  Avoid having throw rugs at the top or bottom of the stairs. If you do have throw rugs, attach them to the floor with carpet tape.  Make sure that you have a light switch at the top of the stairs and the bottom of the stairs. If you do not have them, ask someone to add them for you. What else can I do to help prevent falls?  Wear shoes that:  Do not have high heels.  Have rubber bottoms.  Are comfortable and fit you well.  Are closed at the toe. Do not wear sandals.  If you use a stepladder:  Make sure that it is fully opened. Do not climb a closed stepladder.  Make sure that both sides of the stepladder are locked into place.  Ask someone to hold it for you, if possible.  Clearly mark and make sure that you can see:  Any grab bars or  handrails.  First and last steps.  Where the edge of each step is.  Use tools that help you move around (mobility aids) if they are needed. These include:  Canes.  Walkers.  Scooters.  Crutches.  Turn on the lights when you go into a dark area. Replace any light bulbs as soon as they burn out.  Set up your furniture so you have a clear path. Avoid moving your furniture around.  If any of your floors are uneven, fix them.  If there are any pets around you, be aware of where they are.  Review your medicines with your doctor. Some medicines can make you feel dizzy. This can increase your chance of falling. Ask your doctor what other things that you can do to help prevent falls. This information is not intended to replace advice given to you by your health care provider. Make sure you discuss any questions you have with your health care provider. Document Released: 02/25/2009 Document Revised: 10/07/2015 Document Reviewed: 06/05/2014 Elsevier Interactive Patient Education  2017 [employer].

## 2020-04-29 ENCOUNTER — Other Ambulatory Visit: Payer: Self-pay | Admitting: Cardiovascular Disease

## 2020-04-29 ENCOUNTER — Other Ambulatory Visit: Payer: Self-pay | Admitting: Endocrinology

## 2020-04-29 ENCOUNTER — Other Ambulatory Visit: Payer: Self-pay | Admitting: Family Medicine

## 2020-04-29 ENCOUNTER — Other Ambulatory Visit: Payer: Self-pay | Admitting: Pulmonary Disease

## 2020-05-12 ENCOUNTER — Other Ambulatory Visit: Payer: Self-pay | Admitting: Family Medicine

## 2020-05-12 ENCOUNTER — Other Ambulatory Visit: Payer: Self-pay | Admitting: Pulmonary Disease

## 2020-05-15 DIAGNOSIS — N183 Chronic kidney disease, stage 3 unspecified: Secondary | ICD-10-CM

## 2020-05-15 HISTORY — DX: Chronic kidney disease, stage 3 unspecified: N18.30

## 2020-05-23 ENCOUNTER — Other Ambulatory Visit: Payer: Self-pay | Admitting: Endocrinology

## 2020-05-23 ENCOUNTER — Other Ambulatory Visit: Payer: Self-pay | Admitting: Cardiovascular Disease

## 2020-06-04 ENCOUNTER — Encounter: Payer: Self-pay | Admitting: Family Medicine

## 2020-07-11 ENCOUNTER — Other Ambulatory Visit: Payer: Self-pay | Admitting: Pulmonary Disease

## 2020-07-14 ENCOUNTER — Other Ambulatory Visit: Payer: Self-pay | Admitting: Pulmonary Disease

## 2020-07-15 NOTE — Telephone Encounter (Signed)
Pt has not been seen at office since 11/07/2018. Pt needs to be scheduled for an appt so we can continue refilling his meds.  Attempted to call pt but unable to reach. Left pt a detailed message letting him know that we need follow up appt scheduled due to it being almost 2 years since last seen. Once we see that pt has a f/u appt scheduled, we will then refill med. Will hold med refill request in the refill pool so we can keep an eye out to see if pt has made an appt.

## 2020-07-20 ENCOUNTER — Ambulatory Visit (INDEPENDENT_AMBULATORY_CARE_PROVIDER_SITE_OTHER): Payer: Medicare Other | Admitting: Primary Care

## 2020-07-20 ENCOUNTER — Other Ambulatory Visit: Payer: Self-pay

## 2020-07-20 ENCOUNTER — Encounter: Payer: Self-pay | Admitting: Primary Care

## 2020-07-20 VITALS — BP 120/80 | HR 70 | Temp 97.3°F | Ht 71.0 in | Wt 246.8 lb

## 2020-07-20 DIAGNOSIS — I2699 Other pulmonary embolism without acute cor pulmonale: Secondary | ICD-10-CM | POA: Diagnosis not present

## 2020-07-20 DIAGNOSIS — Z9989 Dependence on other enabling machines and devices: Secondary | ICD-10-CM

## 2020-07-20 DIAGNOSIS — G4733 Obstructive sleep apnea (adult) (pediatric): Secondary | ICD-10-CM

## 2020-07-20 NOTE — Patient Instructions (Addendum)
Recommendations: - Resume CPAP use after you receive new machine or if device is not listed on recall list - Aim to wear every night 4-6 hours or longer, do not drive if experiencing excessive daytime fatigue or somnolence, continues to work on weight loss efforts   Register your device: https://www.usa.http://www.black-smith.org/ OR Https://www.philipssrcupdate.expertinquiry.com/?ulang=en  Orders: New CPAP machine   Follow-up: 6 months with Dr. Craige Cotta or [facility] NP     Sleep Apnea Sleep apnea affects breathing during sleep. It causes breathing to stop for a short time or to become shallow. It can also increase the risk of:  Heart attack.  Stroke.  Being very overweight (obese).  Diabetes.  Heart failure.  Irregular heartbeat. The goal of treatment is to help you breathe normally again. What are the causes? There are three kinds of sleep apnea:  Obstructive sleep apnea. This is caused by a blocked or collapsed airway.  Central sleep apnea. This happens when the brain does not send the right signals to the muscles that control breathing.  Mixed sleep apnea. This is a combination of obstructive and central sleep apnea. The most common cause of this condition is a collapsed or blocked airway. This can happen if:  Your throat muscles are too relaxed.  Your tongue and tonsils are too large.  You are overweight.  Your airway is too small.   What increases the risk?  Being overweight.  Smoking.  Having a small airway.  Being older.  Being male.  Drinking alcohol.  Taking medicines to calm yourself (sedatives or tranquilizers).  Having family members with the condition. What are the signs or symptoms?  Trouble staying asleep.  Being sleepy or tired during the day.  Getting angry a lot.  Loud snoring.  Headaches in the morning.  Not being able to focus your mind (concentrate).  Forgetting things.  Less interest in  sex.  Mood swings.  Personality changes.  Feelings of sadness (depression).  Waking up a lot during the night to pee (urinate).  Dry mouth.  Sore throat. How is this diagnosed?  Your medical history.  A physical exam.  A test that is done when you are sleeping (sleep study). The test is most often done in a sleep lab but may also be done at home. How is this treated?  Sleeping on your side.  Using a medicine to get rid of mucus in your nose (decongestant).  Avoiding the use of alcohol, medicines to help you relax, or certain pain medicines (narcotics).  Losing weight, if needed.  Changing your diet.  Not smoking.  Using a machine to open your airway while you sleep, such as: ? An oral appliance. This is a mouthpiece that shifts your lower jaw forward. ? A CPAP device. This device blows air through a mask when you breathe out (exhale). ? An EPAP device. This has valves that you put in each nostril. ? A BPAP device. This device blows air through a mask when you breathe in (inhale) and breathe out.  Having surgery if other treatments do not work. It is important to get treatment for sleep apnea. Without treatment, it can lead to:  High blood pressure.  Coronary artery disease.  In men, not being able to have an erection (impotence).  Reduced thinking ability.   Follow these instructions at home: Lifestyle  Make changes that your doctor recommends.  Eat a healthy diet.  Lose weight if needed.  Avoid alcohol, medicines to help you relax, and some pain medicines.  Do not use any products that contain nicotine or tobacco, such as cigarettes, e-cigarettes, and chewing tobacco. If you need help quitting, ask your doctor. General instructions  Take over-the-counter and prescription medicines only as told by your doctor.  If you were given a machine to use while you sleep, use it only as told by your doctor.  If you are having surgery, make sure to tell your  doctor you have sleep apnea. You may need to bring your device with you.  Keep all follow-up visits as told by your doctor. This is important. Contact a doctor if:  The machine that you were given to use during sleep bothers you or does not seem to be working.  You do not get better.  You get worse. Get help right away if:  Your chest hurts.  You have trouble breathing in enough air.  You have an uncomfortable feeling in your back, arms, or stomach.  You have trouble talking.  One side of your body feels weak.  A part of your face is hanging down. These symptoms may be an emergency. Do not wait to see if the symptoms will go away. Get medical help right away. Call your local emergency services (911 in the [place] Do not drive yourself to the hospital. Summary  This condition affects breathing during sleep.  The most common cause is a collapsed or blocked airway.  The goal of treatment is to help you breathe normally while you sleep. This information is not intended to replace advice given to you by your health care provider. Make sure you discuss any questions you have with your health care provider. Document Revised: 02/15/2018 Document Reviewed: 12/25/2017 Elsevier Patient Education  2021 [employer].

## 2020-07-20 NOTE — Progress Notes (Signed)
Reviewed and agree with assessment/plan.   Coralyn Helling, MD [facility] Pulmonary/Critical Care 07/20/2020, 2:34 PM Pager:  [phone]

## 2020-07-20 NOTE — Progress Notes (Signed)
@Patient  ID: , male    DOB: 09/25/1965, 55 y.o.   MRN: [id number]  Chief Complaint  Patient presents with  . Follow-up    Pt states he has been doing okay since last visit. States that he stopped wearing his CPAP due to recall on the machine.     Referring provider: [id number], MD  HPI: 55 year old male, former smoker. PMH significant for HTN, pulmonary embolism, coronary atherosclerosis, OSA, type 2 diabetes, obesity. Patient of Dr. 57, last seen by pulmonary NP on 11/07/18. HST 07/19/15 showed severe obstructive sleep apnea, AHI 36.4/hr with SpO2 low 80%. Maintained on CPAP 5-15cm h20.   07/20/2020- Interim hx  Patient present today for annual follow-up OSA. He stopped using CPAP machine several months ago d/t [employer] recall. Prior to that he was tolerating machine well. He gets on average 8 hours of sleep a night. Continues to snore and wakes himself up talking. Energy level is lower. He has lost 20-30 total since his sleep study in 2017. No download available. [facility] 9/24.     Allergies  Allergen Reactions  . Actos [Pioglitazone] Other (See Comments)    Potential cause of DVT  . Bee Venom Anaphylaxis  . Klonopin [Clonazepam] Anaphylaxis  . Invokana [Canagliflozin] Nausea Only    Nausea/dizzy/bad taste in mouth  . Niacin Other (See Comments)    "makes his crazy"    Immunization History  Administered Date(s) Administered  . Influenza,inj,Quad PF,6+ Mos 06/23/2015, 01/10/2018, 01/17/2019, 01/20/2020  . PFIZER(Purple Top)SARS-COV-2 Vaccination 12/06/2019, 12/27/2019  . Pneumococcal Polysaccharide-23 10/07/2010  . Td 12/17/2008  . Tdap 01/17/2019, 01/22/2020    Past Medical History:  Diagnosis Date  . ANEMIA, PERNICIOUS 04/03/2007  . Anxiety and depression   . Arthritis    "left hand" (06/26/2016)  . Chronic lower back pain   . Colon cancer screening 10/27/2016   Cologuard NEG-->rpt 3 yrs  . CORONARY ARTERY DISEASE 12/05/2006  . DIABETES MELLITUS,  TYPE II dx'd 2001  . DVT (deep venous thrombosis) (HCC) 2016   BLE  . GERD (gastroesophageal reflux disease)   . History of balanitis    x 2. Holding off on circ as of 05/2020 urol f/u  . History of blood transfusion 2001   "when I had my hand OR"  . History of gout   . History of kidney stones   . HYPERCHOLESTEROLEMIA 07/17/2007  . HYPERTENSION 12/05/2006  . Learning disability   . Migraine    "1-2/month" (06/26/2016)  . Myocardial infarction (HCC) ~ 2003  . Obesity, Class II, BMI 35-39.9   . OSA on CPAP    Pt cannot recall setting clearly but thinks it is 5 cm h20.    . PE (pulmonary embolism) 2016  . Stroke [facility]) ~ 2006   denies residual on 06/26/2016  . TIA (transient ischemic attack) ?06/25/2016  . VISUAL ACUITY, DECREASED, RIGHT EYE 02/19/2009    Tobacco History: Social History   Tobacco Use  Smoking Status Former Smoker  . Packs/day: 0.10  . Years: 3.00  . Pack years: 0.30  . Types: Cigarettes  . Start date: 31  . Quit date: 05/15/1984  . Years since quitting: 36.2  Smokeless Tobacco Never Used   Counseling given: Not Answered   Outpatient Medications Prior to Visit  Medication Sig Dispense Refill  . acetaminophen (TYLENOL) 500 MG tablet Take 1,000 mg by mouth every 6 (six) hours as needed.    . bromocriptine (PARLODEL) 2.5 MG tablet TAKE 1  TABLET (2.5 MG TOTAL) BY MOUTH DAILY. TAKE 1/4 TABLET BY MOUTH EVERY DAY 45 tablet 1  . clopidogrel (PLAVIX) 75 MG tablet TAKE 1 TABLET BY MOUTH EVERY DAY 30 tablet 0  . Cyanocobalamin (VITAMIN B12 PO) Take by mouth daily.    . diazepam (VALIUM) 10 MG tablet Take 10 mg by mouth 3 (three) times daily.    . DULoxetine (CYMBALTA) 30 MG capsule Take 30 mg by mouth daily.    Marland Kitchen EPINEPHrine 0.3 mg/0.3 mL IJ SOAJ injection Inject 0.3 mLs (0.3 mg total) into the muscle as needed for anaphylaxis. 2 each 1  . fenofibrate (TRICOR) 145 MG tablet TAKE 1 TABLET BY MOUTH EVERY DAY 90 tablet 1  . fluconazole (DIFLUCAN) 150 MG tablet 1 tab po  every 7 days 4 tablet 1  . FLUoxetine (PROZAC) 40 MG capsule Take 40 mg by mouth daily.    . hydrochlorothiazide (HYDRODIURIL) 12.5 MG tablet TAKE 1 TABLET BY MOUTH EVERY DAY WITH THE LOSARTAN 90 tablet 1  . JARDIANCE 25 MG TABS tablet TAKE 1 TABLET BY MOUTH EVERY DAY 90 tablet 1  . losartan (COZAAR) 100 MG tablet TAKE 1/2 TABLET BY MOUTH DAILY 45 tablet 1  . metFORMIN (GLUCOPHAGE-XR) 500 MG 24 hr tablet TAKE 2 TABLETS BY MOUTH TWICE A DAY 360 tablet 0  . nitroGLYCERIN (NITROSTAT) 0.4 MG SL tablet Place 1 tablet (0.4 mg total) under the tongue every 5 (five) minutes as needed for chest pain. 10 tablet 0  . nystatin cream (MYCOSTATIN) Apply 1 application topically 2 (two) times daily. 30 g 1  . ONETOUCH VERIO test strip USE TO CHECK BLOOD SUGAR 1 TIME PER DAY. DX CODE: E11.9 100 each 2  . OZEMPIC, 1 MG/DOSE, 2 MG/1.5ML SOPN INJECT 1 MG INTO THE SKIN ONCE A WEEK. 6 pen 3  . rosuvastatin (CRESTOR) 5 MG tablet TAKE 1 TABLET BY MOUTH EVERY DAY 90 tablet 1  . traMADol (ULTRAM) 50 MG tablet Take 1-2 tabs at bedtime prn pain 14 tablet 1  . XARELTO 20 MG TABS tablet TAKE 1 TABLET BY MOUTH EVERY DAY BEFORE SUPPER 30 tablet 0  . fluticasone (FLONASE) 50 MCG/ACT nasal spray USE ONE SPRAY IN EACH NOSTRIL TWICE A DAY (Patient not taking: No sig reported) 16 g 2   No facility-administered medications prior to visit.   Review of Systems  Review of Systems  Constitutional: Negative.   HENT: Negative.   Respiratory: Negative.   Cardiovascular: Negative.    Physical Exam  BP 120/80 (BP Location: Left Arm, Cuff Size: Large)   Pulse 70   Temp (!) 97.3 F (36.3 C) (Temporal)   Ht 5\' 11"  (1.803 m)   Wt 246 lb 12.8 oz (111.9 kg)   SpO2 96% Comment: RA  BMI 34.42 kg/m  Physical Exam Constitutional:      Appearance: Normal appearance.  HENT:     Head: Normocephalic and atraumatic.     Mouth/Throat:     Comments: Deferred d/t masking Cardiovascular:     Rate and Rhythm: Normal rate and regular  rhythm.  Pulmonary:     Effort: Pulmonary effort is normal.     Breath sounds: Normal breath sounds.  Musculoskeletal:        General: Normal range of motion.  Skin:    General: Skin is warm and dry.  Neurological:     General: No focal deficit present.     Mental Status: He is alert and oriented to person, place, and time. Mental status  is at baseline.  Psychiatric:        Mood and Affect: Mood normal.        Behavior: Behavior normal.        Thought Content: Thought content normal.        Judgment: Judgment normal.      Lab Results:  CBC    Component Value Date/Time   WBC 5.4 01/20/2020 0826   RBC 4.45 01/20/2020 0826   HGB 13.9 01/20/2020 0826   HGB 14.0 07/12/2016 0931   HCT 41.5 01/20/2020 0826   HCT 39.6 07/12/2016 0931   PLT 394.0 01/20/2020 0826   PLT 399 (H) 07/12/2016 0931   MCV 93.2 01/20/2020 0826   MCV 90 07/12/2016 0931   MCH 31.7 07/12/2016 0931   MCH 30.3 06/28/2016 0159   MCHC 33.4 01/20/2020 0826   RDW 13.9 01/20/2020 0826   RDW 12.9 07/12/2016 0931   LYMPHSABS 1.3 01/20/2020 0826   MONOABS 0.5 01/20/2020 0826   EOSABS 0.1 01/20/2020 0826   BASOSABS 0.1 01/20/2020 0826    BMET    Component Value Date/Time   NA 139 01/20/2020 0826   NA 137 07/12/2016 0931   K 4.5 01/20/2020 0826   CL 103 01/20/2020 0826   CO2 27 01/20/2020 0826   GLUCOSE 106 (H) 01/20/2020 0826   BUN 21 01/20/2020 0826   BUN 17 07/12/2016 0931   CREATININE 1.32 01/20/2020 0826   CREATININE 1.24 01/17/2019 1630   CALCIUM 9.8 01/20/2020 0826   GFRNONAA 79 07/12/2016 0931   GFRAA 91 07/12/2016 0931    BNP    Component Value Date/Time   BNP 6.9 06/25/2016 1552    ProBNP    Component Value Date/Time   PROBNP 49.4 11/26/2013 0525    Imaging: No results found.   Assessment & Plan:   OSA (obstructive sleep apnea) - HST in 2017 showed severe obstructive sleep apnea. Maintained on CPAP auto titrate 5-15cm h20. He stopped using CPAP machine several months ago d/t  recall. I have given him information on registering his device with [employer]. He is approaching 5 years with current machine so we will also place an order for new CPAP machine with [employer]. Encourage he resume use when able, aim to wear 4-6 hours or longer each night. Continue to work on weight loss efforts. Recommend following up in 6 months for compliance check.   Pulmonary embolism (HCC) - Dx 2015. Continues Xarelto 20mg  daily      , NP 07/20/2020

## 2020-07-20 NOTE — Assessment & Plan Note (Addendum)
-   Dx 2015. Continues Xarelto 20mg  daily

## 2020-07-20 NOTE — Assessment & Plan Note (Signed)
-   HST in 2017 showed severe obstructive sleep apnea. Maintained on CPAP auto titrate 5-15cm h20. He stopped using CPAP machine several months ago d/t recall. I have given him information on registering his device with [employer]. He is approaching 5 years with current machine so we will also place an order for new CPAP machine with [employer]. Encourage he resume use when able, aim to wear 4-6 hours or longer each night. Continue to work on weight loss efforts. Recommend following up in 6 months for compliance check.

## 2020-08-10 ENCOUNTER — Other Ambulatory Visit: Payer: Self-pay | Admitting: Cardiovascular Disease

## 2020-08-17 ENCOUNTER — Other Ambulatory Visit: Payer: Self-pay | Admitting: Primary Care

## 2020-08-17 ENCOUNTER — Other Ambulatory Visit: Payer: Self-pay | Admitting: Endocrinology

## 2020-08-17 ENCOUNTER — Other Ambulatory Visit: Payer: Self-pay | Admitting: Cardiovascular Disease

## 2020-08-17 ENCOUNTER — Other Ambulatory Visit: Payer: Self-pay | Admitting: Family Medicine

## 2020-08-17 NOTE — Progress Notes (Signed)
Date:  08/19/2020   ID:  Xavier [name]., DOB 03-25-1966, MRN [id number]  PCP:  [name], MD  Cardiologist:  [name], MD   Evaluation Performed:  Follow-Up Visit  Chief Complaint:  CAD  History of Present Illness:    Xavier [name]. is a 55 y.o. 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   I have not seen him since 2019   Active no angina likes fishing and [place] shooting  Vaccinated for COVID  Chronic shoulder pain   Has pain in LLE sounds more like neuropathy than claudication  Painful manubrium with bone sticking out    Past Medical History:  Diagnosis Date  . ANEMIA, PERNICIOUS 04/03/2007  . Anxiety and depression   . Arthritis    "left hand" (06/26/2016)  . Chronic lower back pain   . Colon cancer screening 10/27/2016   Cologuard NEG-->rpt 3 yrs  . CORONARY ARTERY DISEASE 12/05/2006  . DIABETES MELLITUS, TYPE II dx'd 2001  . DVT (deep venous thrombosis) (HCC) 2016   BLE  . GERD (gastroesophageal reflux disease)   . History of balanitis    x 2. Holding off on circ as of 05/2020 urol f/u  . History of blood transfusion 2001   "when I had my hand OR"  . History of gout   . History of kidney stones   . HYPERCHOLESTEROLEMIA 07/17/2007  . HYPERTENSION 12/05/2006  . Learning disability   . Migraine    "1-2/month" (06/26/2016)  . Myocardial infarction (HCC) ~ 2003  . Obesity, Class II, BMI 35-39.9   . OSA on CPAP    Pt cannot recall setting clearly but thinks it is 5 cm h20.    . PE (pulmonary embolism) 2016  . Stroke [facility]) ~ 2006   denies residual on 06/26/2016  . TIA (transient ischemic attack) ?06/25/2016  . VISUAL ACUITY, DECREASED, RIGHT EYE 02/19/2009   Past Surgical History:  Procedure Laterality Date  . CARDIAC CATHETERIZATION  1990s; 2012; 06/2016   06/2016 distal LAD DES stent.  EF 55-65%  . CARDIOVASCULAR STRESS TEST  06/2016   Echo stress  test->"inconclusive"-->cath was done after this  . CATARACT EXTRACTION W/ INTRAOCULAR LENS IMPLANT Right 2017  . CORONARY STENT INTERVENTION N/A 06/27/2016   Procedure: Coronary Stent Intervention;  Surgeon: Marykay Lex, MD;  Location: [facility] INVASIVE CV LAB;  Service: Cardiovascular;  Laterality: N/A;  . EYE SURGERY Right    Cataract  . INGUINAL HERNIA REPAIR Bilateral 1990s  . INTRAVASCULAR PRESSURE WIRE/FFR STUDY N/A 06/27/2016   Procedure: Intravascular Pressure Wire/FFR Study;  Surgeon: Marykay Lex, MD;  Location: [facility] INVASIVE CV LAB;  Service: Cardiovascular;  Laterality: N/A;  . LEFT HEART CATH AND CORONARY ANGIOGRAPHY N/A 06/27/2016   Procedure: Left Heart Cath and Coronary Angiography;  Surgeon: Marykay Lex, MD;  Location: [facility] INVASIVE CV LAB;  Service: Cardiovascular;  Laterality: N/A;  . REATTACHMENT HAND Left ~ 2001   w/carpal tunnel release  . UMBILICAL HERNIA REPAIR  1990s   w/IHR     Current Meds  Medication Sig  . acetaminophen (TYLENOL) 500 MG tablet Take 1,000 mg by mouth every 6 (six) hours as needed.  . bromocriptine (PARLODEL) 2.5 MG tablet TAKE 1 TABLET (2.5 MG TOTAL) BY MOUTH DAILY. TAKE 1/4 TABLET BY MOUTH EVERY DAY  . clopidogrel (PLAVIX) 75 MG tablet Take 1 tablet (75 mg  total) by mouth daily.  . Cyanocobalamin (VITAMIN B12 PO) Take by mouth daily.  . diazepam (VALIUM) 10 MG tablet Take 10 mg by mouth 3 (three) times daily.  . DULoxetine (CYMBALTA) 30 MG capsule Take 30 mg by mouth daily.  Marland Kitchen EPINEPHrine 0.3 mg/0.3 mL IJ SOAJ injection Inject 0.3 mLs (0.3 mg total) into the muscle as needed for anaphylaxis.  . fenofibrate (TRICOR) 145 MG tablet TAKE 1 TABLET BY MOUTH EVERY DAY  . fluconazole (DIFLUCAN) 150 MG tablet 1 tab po every 7 days  . FLUoxetine (PROZAC) 40 MG capsule Take 40 mg by mouth daily.  . fluticasone (FLONASE) 50 MCG/ACT nasal spray USE ONE SPRAY IN EACH NOSTRIL TWICE A DAY  . hydrochlorothiazide (HYDRODIURIL) 12.5 MG tablet TAKE 1 TABLET BY  MOUTH EVERY DAY WITH THE LOSARTAN  . JARDIANCE 25 MG TABS tablet TAKE 1 TABLET BY MOUTH EVERY DAY  . losartan (COZAAR) 100 MG tablet TAKE 1/2 TABLET BY MOUTH DAILY  . metFORMIN (GLUCOPHAGE-XR) 500 MG 24 hr tablet TAKE 2 TABLETS BY MOUTH TWICE A DAY  . nitroGLYCERIN (NITROSTAT) 0.4 MG SL tablet Place 1 tablet (0.4 mg total) under the tongue every 5 (five) minutes as needed for chest pain.  Marland Kitchen nystatin cream (MYCOSTATIN) Apply 1 application topically 2 (two) times daily.  Letta Pate VERIO test strip USE TO CHECK BLOOD SUGAR 1 TIME PER DAY. DX CODE: E11.9  . OZEMPIC, 1 MG/DOSE, 2 MG/1.5ML SOPN INJECT 1 MG INTO THE SKIN ONCE A WEEK.  . rosuvastatin (CRESTOR) 5 MG tablet TAKE 1 TABLET BY MOUTH EVERY DAY  . traMADol (ULTRAM) 50 MG tablet Take 1-2 tabs at bedtime prn pain  . XARELTO 20 MG TABS tablet TAKE 1 TABLET BY MOUTH EVERY DAY BEFORE SUPPER     Allergies:   Actos [pioglitazone], Bee venom, Klonopin [clonazepam], Invokana [canagliflozin], and Niacin   Social History   Tobacco Use  . Smoking status: Former Smoker    Packs/day: 0.10    Years: 3.00    Pack years: 0.30    Types: Cigarettes    Start date: 107    Quit date: 05/15/1984    Years since quitting: 36.2  . Smokeless tobacco: Never Used  Vaping Use  . Vaping Use: Never used  Substance Use Topics  . Alcohol use: No  . Drug use: No     Family Hx: The patient's family history includes COPD in his mother; Lymphoma in his father.  ROS:   Please see the history of present illness.     All other systems reviewed and are negative.  Prior CV studies:   The following studies were reviewed today:  Cardiac Cath Conclusion 06/2016     The left ventricular systolic function is normal. The left ventricular ejection fraction is 55-65% by visual estimate.  LV end diastolic pressure is normal.  ________Pertinent Coronary Anatomy Findings__________  Dist LAD-1 lesion, 70 %stenosed. FFR 0.77 (Physiologically Significant)  A  STENT SYNERGY DES 2.25X12 drug eluting stent was successfully placed (post-dilated to 2.5 mm)  Post intervention, there is a 0% residual stenosis.   Successful FFR guided PCI of distal LAD focal 70-75% lesion with DES stent.  Otherwise no sniffing CAD noted.  Plan:  Return to nursing unit for ongoing care and TR band removal.  Would continue aspirin plus Brilinta for minimum of 3 months. After that would be okay to stop aspirin if necessary.  Continue risk factor modification by Cardiology Consultation Team  He Would Be [facility] Discharge  from a Cardiac Standpoint/Post PCI on February 14.     Labs/Other Tests and Data Reviewed:    EKG:   12/19/17 SR rate 74 nonspecific ST changes 08/19/2020 SR rate 67 nonspecific ST changes   Recent Labs: 01/20/2020: ALT 19; BUN 21; Creatinine, Ser 1.32; Hemoglobin 13.9; Platelets 394.0; Potassium 4.5; Sodium 139   Recent Lipid Panel Lab Results  Component Value Date/Time   CHOL 145 01/20/2020 08:26 AM   TRIG 122.0 01/20/2020 08:26 AM   HDL 63.60 01/20/2020 08:26 AM   CHOLHDL 2 01/20/2020 08:26 AM   LDLCALC 57 01/20/2020 08:26 AM   LDLDIRECT 82.0 11/04/2018 09:36 AM    Wt Readings from Last 3 Encounters:  08/19/20 112.9 kg  07/20/20 111.9 kg  04/28/20 110.7 kg     Objective:    Vital Signs:  BP 114/70   Pulse 67   Ht 5\' 11"  (1.803 m)   Wt 112.9 kg   SpO2 98%   BMI 34.73 kg/m    Affect appropriate Healthy:  appears stated age HEENT: normal Neck supple with no adenopathy JVP normal no bruits no thyromegaly Lungs clear with no wheezing and good diaphragmatic motion Heart:  S1/S2 no murmur, no rub, gallop or click PMI normal Large protruding lower manubrial bone  Abdomen: benighn, BS positve, no tenderness, no AAA no bruit.  No HSM or HJR Distal pulses intact with no bruits No edema Neuro non-focal Skin warm and dry No muscular weakness   ASSESSMENT & PLAN:    1. CAD s/p PCI to LAD 2018: -Stable with no anginal  complaints -Plavix mono Rx due to anticoagulation  -Continue statin   2. HTN: -Well controlled.  Continue current medications and low sodium Dash type diet.    3. HLD: -started on fibrates by primary 05/13/20 continue crestor labs with primary  - LDL 69 01/20/20   4. DVT/PE: -On chronic Xarelto with no c/o of acute bleeding in stool or urine  5. DM2: -On Jardiance  -Last Hb A1c, 6.7 09/03/19  -Followed by PCP   6. Mild renal insufficiency: -Creatinine 1.32 01/20/20   7. LLE Pain:  Likely neuropathy will check ABI's but palpable DP/PT on exam   8. Manubrium:  Prominent painful bone abnormality refer to CVTS to see if it can be Operated on     Medication Adjustments/Labs and Tests Ordered: Current medicines are reviewed at length with the patient today.  Concerns regarding medicines are outlined above.   Tests Ordered: Orders Placed This Encounter  Procedures  . Ambulatory referral to Cardiothoracic Surgery  . EKG 12-Lead  . VAS 03/21/20 ABI WITH/WO TBI  . VAS [place] LOWER EXTREMITY ARTERIAL DUPLEX  ABI's   Medication Changes: No orders of the defined types were placed in this encounter.   Follow Up:  6 months     Signed, [place], MD  08/19/2020 9:54 AM    [facility]

## 2020-08-17 NOTE — Telephone Encounter (Signed)
LOV--02/27/20 Please advise

## 2020-08-19 ENCOUNTER — Other Ambulatory Visit: Payer: Self-pay

## 2020-08-19 ENCOUNTER — Encounter: Payer: Self-pay | Admitting: Cardiovascular Disease

## 2020-08-19 ENCOUNTER — Ambulatory Visit (INDEPENDENT_AMBULATORY_CARE_PROVIDER_SITE_OTHER): Payer: Medicare Other | Admitting: Cardiovascular Disease

## 2020-08-19 VITALS — BP 114/70 | HR 67 | Ht 71.0 in | Wt 249.0 lb

## 2020-08-19 DIAGNOSIS — I739 Peripheral vascular disease, unspecified: Secondary | ICD-10-CM | POA: Diagnosis not present

## 2020-08-19 DIAGNOSIS — I10 Essential (primary) hypertension: Secondary | ICD-10-CM

## 2020-08-19 DIAGNOSIS — M954 Acquired deformity of chest and rib: Secondary | ICD-10-CM | POA: Diagnosis not present

## 2020-08-19 DIAGNOSIS — I2510 Atherosclerotic heart disease of native coronary artery without angina pectoris: Secondary | ICD-10-CM

## 2020-08-19 DIAGNOSIS — E785 Hyperlipidemia, unspecified: Secondary | ICD-10-CM

## 2020-08-19 NOTE — Patient Instructions (Addendum)
Medication Instructions:  *If you need a refill on your cardiac medications before your next appointment, please call your pharmacy*  Lab Work: If you have labs (blood work) drawn today and your tests are completely normal, you will receive your results only by: Marland Kitchen MyChart Message (if you have MyChart) OR . A paper copy in the mail If you have any lab test that is abnormal or we need to change your treatment, we will call you to review the results.  Testing/Procedures: Your physician has requested that you have an ankle brachial index (ABI). During this test an ultrasound and blood pressure cuff are used to evaluate the arteries that supply the arms and legs with blood. Allow thirty minutes for this exam. There are no restrictions or special instructions.  Follow-Up: At [facility], you and your health needs are our priority.  As part of our continuing mission to provide you with exceptional heart care, we have created designated Provider Care Teams.  These Care Teams include your primary Cardiologist (physician) and Advanced Practice Providers (APPs -  Physician Assistants and Nurse Practitioners) who all work together to provide you with the care you need, when you need it.  We recommend signing up for the patient portal called "MyChart".  Sign up information is provided on this After Visit Summary.  MyChart is used to connect with patients for Virtual Visits (Telemedicine).  Patients are able to view lab/test results, encounter notes, upcoming appointments, etc.  Non-urgent messages can be sent to your provider as well.   To learn more about what you can do with MyChart, go to [email or web address].    Your next appointment:   6 month(s)  The format for your next appointment:   In Person  Provider:   You may see [name], MD or one of the following Advanced Practice Providers on your designated Care Team:    Georgie Chard, NP  You have been referred to Dr. Laneta Simmers with  cardiothoracic surgery consult.

## 2020-08-20 ENCOUNTER — Ambulatory Visit (INDEPENDENT_AMBULATORY_CARE_PROVIDER_SITE_OTHER): Payer: Medicare Other | Admitting: Endocrinology

## 2020-08-20 VITALS — BP 110/74 | HR 75 | Ht 71.0 in | Wt 251.4 lb

## 2020-08-20 DIAGNOSIS — E119 Type 2 diabetes mellitus without complications: Secondary | ICD-10-CM

## 2020-08-20 DIAGNOSIS — I2510 Atherosclerotic heart disease of native coronary artery without angina pectoris: Secondary | ICD-10-CM | POA: Diagnosis not present

## 2020-08-20 LAB — POCT GLYCOSYLATED HEMOGLOBIN (HGB A1C): Hemoglobin A1C: 6.6 % — AB (ref 4.0–5.6)

## 2020-08-20 MED ORDER — FREESTYLE LIBRE 2 SENSOR MISC: 1.0000 | 3 refills | Status: DC

## 2020-08-20 MED ORDER — FREESTYLE LIBRE 2 READER DEVI: 1.0000 | Freq: Once | 1 refills | Status: AC

## 2020-08-20 NOTE — Telephone Encounter (Signed)
Please advise 

## 2020-08-20 NOTE — Progress Notes (Signed)
Subjective:    Patient ID: Xavier Cory., male    DOB: 1966-04-26, 55 y.o.   MRN: [id number]  HPI Pt returns for f/u of diabetes mellitus:  DM type: 2 Dx'ed: 2005 Complications: CAD, CRI, and PN.   Therapy: Ozempic and 3 oral meds.   DKA: never Severe hypoglycemia: never.   Pancreatitis: never.  Other: he did not tolerate pioglitizone (edema); he has never taken insulin, except in the hospital.   Interval history: no cbg record, but states cbg's are well-controlled.  pt states he feels well in general.  He takes meds as rx'ed.  Past Medical History:  Diagnosis Date  . ANEMIA, PERNICIOUS 04/03/2007  . Anxiety and depression   . Arthritis    "left hand" (06/26/2016)  . Chronic lower back pain   . Colon cancer screening 10/27/2016   Cologuard NEG-->rpt 3 yrs  . CORONARY ARTERY DISEASE 12/05/2006  . DIABETES MELLITUS, TYPE II dx'd 2001  . DVT (deep venous thrombosis) (HCC) 2016   BLE  . GERD (gastroesophageal reflux disease)   . History of balanitis    x 2. Holding off on circ as of 05/2020 urol f/u  . History of blood transfusion 2001   "when I had my hand OR"  . History of gout   . History of kidney stones   . HYPERCHOLESTEROLEMIA 07/17/2007  . HYPERTENSION 12/05/2006  . Learning disability   . Migraine    "1-2/month" (06/26/2016)  . Myocardial infarction (HCC) ~ 2003  . Obesity, Class II, BMI 35-39.9   . OSA on CPAP    Pt cannot recall setting clearly but thinks it is 5 cm h20.    . PE (pulmonary embolism) 2016  . Stroke [facility] ~ 2006   denies residual on 06/26/2016  . TIA (transient ischemic attack) ?06/25/2016  . VISUAL ACUITY, DECREASED, RIGHT EYE 02/19/2009    Past Surgical History:  Procedure Laterality Date  . CARDIAC CATHETERIZATION  1990s; 2012; 06/2016   06/2016 distal LAD DES stent.  EF 55-65%  . CARDIOVASCULAR STRESS TEST  06/2016   Echo stress test->"inconclusive"-->cath was done after this  . CATARACT EXTRACTION W/ INTRAOCULAR LENS IMPLANT Right 2017  .  CORONARY STENT INTERVENTION N/A 06/27/2016   Procedure: Coronary Stent Intervention;  Surgeon: Marykay Lex, MD;  Location: [facility] INVASIVE CV LAB;  Service: Cardiovascular;  Laterality: N/A;  . EYE SURGERY Right    Cataract  . INGUINAL HERNIA REPAIR Bilateral 1990s  . INTRAVASCULAR PRESSURE WIRE/FFR STUDY N/A 06/27/2016   Procedure: Intravascular Pressure Wire/FFR Study;  Surgeon: Marykay Lex, MD;  Location: [facility] INVASIVE CV LAB;  Service: Cardiovascular;  Laterality: N/A;  . LEFT HEART CATH AND CORONARY ANGIOGRAPHY N/A 06/27/2016   Procedure: Left Heart Cath and Coronary Angiography;  Surgeon: Marykay Lex, MD;  Location: [facility] INVASIVE CV LAB;  Service: Cardiovascular;  Laterality: N/A;  . REATTACHMENT HAND Left ~ 2001   w/carpal tunnel release  . UMBILICAL HERNIA REPAIR  1990s   w/IHR    Social History   Socioeconomic History  . Marital status: Married    Spouse name: Not on file  . Number of children: Not on file  . Years of education: Not on file  . Highest education level: Not on file  Occupational History  . Occupation: Disabled    Employer: DISABLED  Tobacco Use  . Smoking status: Former Smoker    Packs/day: 0.10    Years: 3.00    Pack years: 0.30  Types: Cigarettes    Start date: 54    Quit date: 05/15/1984    Years since quitting: 36.2  . Smokeless tobacco: Never Used  Vaping Use  . Vaping Use: Never used  Substance and Sexual Activity  . Alcohol use: No  . Drug use: No  . Sexual activity: Not on file  Other Topics Concern  . Not on file  Social History Narrative   Married, has 2 sons, no grandchildren.   Orig from [place], born in [place].   Disabled due to left wrist injury at work.   Tobacco: none   Alc: none   Social Determinants of Health   Financial Resource Strain: Low Risk   . Difficulty of Paying Living Expenses: Not hard at all  Food Insecurity: No Food Insecurity  . Worried About Programme researcher, broadcasting/film/video in the Last Year: Never true  . Ran  Out of Food in the Last Year: Never true  Transportation Needs: No Transportation Needs  . Lack of Transportation (Medical): No  . Lack of Transportation (Non-Medical): No  Physical Activity: Inactive  . Days of Exercise per Week: 0 days  . Minutes of Exercise per Session: 0 min  Stress: No Stress Concern Present  . Feeling of Stress : Not at all  Social Connections: Moderately Integrated  . Frequency of Communication with Friends and Family: More than three times a week  . Frequency of Social Gatherings with Friends and Family: More than three times a week  . Attends Religious Services: Never  . Active Member of Clubs or Organizations: Yes  . Attends Banker Meetings: 1 to 4 times per year  . Marital Status: Married  Catering manager Violence: Not At Risk  . Fear of Current or Ex-Partner: No  . Emotionally Abused: No  . Physically Abused: No  . Sexually Abused: No    Current Outpatient Medications on File Prior to Visit  Medication Sig Dispense Refill  . acetaminophen (TYLENOL) 500 MG tablet Take 1,000 mg by mouth every 6 (six) hours as needed.    . bromocriptine (PARLODEL) 2.5 MG tablet TAKE 1 TABLET (2.5 MG TOTAL) BY MOUTH DAILY. TAKE 1/4 TABLET BY MOUTH EVERY DAY 45 tablet 1  . clopidogrel (PLAVIX) 75 MG tablet Take 1 tablet (75 mg total) by mouth daily. 90 tablet 0  . Cyanocobalamin (VITAMIN B12 PO) Take by mouth daily.    . diazepam (VALIUM) 10 MG tablet Take 10 mg by mouth 3 (three) times daily.    . DULoxetine (CYMBALTA) 30 MG capsule Take 30 mg by mouth daily.    Marland Kitchen EPINEPHrine 0.3 mg/0.3 mL IJ SOAJ injection Inject 0.3 mLs (0.3 mg total) into the muscle as needed for anaphylaxis. 2 each 1  . fenofibrate (TRICOR) 145 MG tablet TAKE 1 TABLET BY MOUTH EVERY DAY 90 tablet 1  . fluconazole (DIFLUCAN) 150 MG tablet TAKE 1 TAB BY MOUTH EVERY 7 DAYS 4 tablet 1  . FLUoxetine (PROZAC) 40 MG capsule Take 40 mg by mouth daily.    . fluticasone (FLONASE) 50 MCG/ACT nasal  spray USE ONE SPRAY IN EACH NOSTRIL TWICE A DAY 16 g 2  . hydrochlorothiazide (HYDRODIURIL) 12.5 MG tablet TAKE 1 TABLET BY MOUTH EVERY DAY WITH THE LOSARTAN 90 tablet 1  . JARDIANCE 25 MG TABS tablet TAKE 1 TABLET BY MOUTH EVERY DAY 90 tablet 1  . losartan (COZAAR) 100 MG tablet TAKE 1/2 TABLET BY MOUTH EVERY DAY 45 tablet 1  . metFORMIN (GLUCOPHAGE-XR)  500 MG 24 hr tablet TAKE 2 TABLETS BY MOUTH TWICE A DAY 360 tablet 0  . nitroGLYCERIN (NITROSTAT) 0.4 MG SL tablet Place 1 tablet (0.4 mg total) under the tongue every 5 (five) minutes as needed for chest pain. 10 tablet 0  . nystatin cream (MYCOSTATIN) Apply 1 application topically 2 (two) times daily. 30 g 1  . ONETOUCH VERIO test strip USE TO CHECK BLOOD SUGAR 1 TIME PER DAY. DX CODE: E11.9 100 each 2  . OZEMPIC, 1 MG/DOSE, 2 MG/1.5ML SOPN INJECT 1 MG INTO THE SKIN ONCE A WEEK. 6 pen 3  . rosuvastatin (CRESTOR) 5 MG tablet TAKE 1 TABLET BY MOUTH EVERY DAY 90 tablet 1  . traMADol (ULTRAM) 50 MG tablet Take 1-2 tabs at bedtime prn pain 14 tablet 1  . XARELTO 20 MG TABS tablet TAKE 1 TABLET BY MOUTH EVERY DAY BEFORE SUPPER 30 tablet 5   No current facility-administered medications on file prior to visit.    Allergies  Allergen Reactions  . Actos [Pioglitazone] Other (See Comments)    Potential cause of DVT  . Bee Venom Anaphylaxis  . Klonopin [Clonazepam] Anaphylaxis  . Invokana [Canagliflozin] Nausea Only    Nausea/dizzy/bad taste in mouth  . Niacin Other (See Comments)    "makes his crazy"    Family History  Problem Relation Age of Onset  . COPD Mother   . Lymphoma Father     BP 110/74 (BP Location: Right Arm, Patient Position: Sitting, Cuff Size: Large)   Pulse 75   Ht 5\' 11"  (1.803 m)   Wt 251 lb 6.4 oz (114 kg)   SpO2 97%   BMI 35.06 kg/m    Review of Systems     Objective:   Physical Exam VITAL SIGNS:  See vs page GENERAL: no distress Pulses: dorsalis pedis intact bilat.   MSK: no deformity of the feet CV: no  leg edema Skin:  no ulcer on the feet.  normal color and temp on the feet. Neuro: sensation is intact to touch on the feet   Lab Results  Component Value Date   HGBA1C 6.6 (A) 08/20/2020       Assessment & Plan:  Type 2 DM: well-controlled.   Patient Instructions  Please continue the same medications.   check your blood sugar once a day.  vary the time of day when you check, between before the 3 meals, and at bedtime.  also check if you have symptoms of your blood sugar being too high or too low.  please keep a record of the readings and bring it to your next appointment here (or you can bring the meter itself).  You can write it on any piece of paper.  please call 10/20/2020 sooner if your blood sugar goes below 70, or if you have a lot of readings over 200.   Please come back for a follow-up appointment in 6 months.

## 2020-08-20 NOTE — Patient Instructions (Signed)
Please continue the same medications check your blood sugar once a day.  vary the time of day when you check, between before the 3 meals, and at bedtime.  also check if you have symptoms of your blood sugar being too high or too low.  please keep a record of the readings and bring it to your next appointment here (or you can bring the meter itself).  You can write it on any piece of paper.  please call us sooner if your blood sugar goes below 70, or if you have a lot of readings over 200. Please come back for a follow-up appointment in 6 months.   

## 2020-08-22 ENCOUNTER — Other Ambulatory Visit: Payer: Self-pay | Admitting: Endocrinology

## 2020-08-23 ENCOUNTER — Ambulatory Visit (HOSPITAL_COMMUNITY)
Admission: RE | Admit: 2020-08-23 | Discharge: 2020-08-23 | Disposition: A | Discharge status: Home / Self Care | Payer: Medicare Other | Source: Ambulatory Visit | Attending: Cardiology | Admitting: Cardiology

## 2020-08-23 ENCOUNTER — Other Ambulatory Visit: Payer: Self-pay

## 2020-08-23 DIAGNOSIS — I739 Peripheral vascular disease, unspecified: Secondary | ICD-10-CM | POA: Diagnosis not present

## 2020-08-27 ENCOUNTER — Other Ambulatory Visit: Payer: Self-pay | Admitting: *Deleted

## 2020-08-27 DIAGNOSIS — R0782 Intercostal pain: Secondary | ICD-10-CM

## 2020-08-27 DIAGNOSIS — R0789 Other chest pain: Secondary | ICD-10-CM

## 2020-08-27 DIAGNOSIS — M954 Acquired deformity of chest and rib: Secondary | ICD-10-CM

## 2020-08-31 ENCOUNTER — Telehealth: Payer: Self-pay | Admitting: *Deleted

## 2020-08-31 DIAGNOSIS — E119 Type 2 diabetes mellitus without complications: Secondary | ICD-10-CM

## 2020-08-31 MED ORDER — FREESTYLE LIBRE 2 READER DEVI: 0 refills | Status: DC

## 2020-09-02 ENCOUNTER — Encounter (HOSPITAL_COMMUNITY): Payer: Self-pay

## 2020-09-02 ENCOUNTER — Emergency Department (HOSPITAL_COMMUNITY): Payer: Medicare Other

## 2020-09-02 ENCOUNTER — Emergency Department (HOSPITAL_COMMUNITY)
Admission: EM | Admit: 2020-09-02 | Discharge: 2020-09-02 | Disposition: A | Discharge status: Home / Self Care | Payer: Medicare Other | Attending: Emergency Medicine | Admitting: Emergency Medicine

## 2020-09-02 ENCOUNTER — Other Ambulatory Visit: Payer: Self-pay

## 2020-09-02 DIAGNOSIS — Z87891 Personal history of nicotine dependence: Secondary | ICD-10-CM | POA: Insufficient documentation

## 2020-09-02 DIAGNOSIS — E119 Type 2 diabetes mellitus without complications: Secondary | ICD-10-CM | POA: Insufficient documentation

## 2020-09-02 DIAGNOSIS — I10 Essential (primary) hypertension: Secondary | ICD-10-CM | POA: Diagnosis not present

## 2020-09-02 DIAGNOSIS — Z7902 Long term (current) use of antithrombotics/antiplatelets: Secondary | ICD-10-CM | POA: Diagnosis not present

## 2020-09-02 DIAGNOSIS — I2510 Atherosclerotic heart disease of native coronary artery without angina pectoris: Secondary | ICD-10-CM | POA: Diagnosis not present

## 2020-09-02 DIAGNOSIS — M5442 Lumbago with sciatica, left side: Secondary | ICD-10-CM | POA: Diagnosis not present

## 2020-09-02 DIAGNOSIS — Z7984 Long term (current) use of oral hypoglycemic drugs: Secondary | ICD-10-CM | POA: Insufficient documentation

## 2020-09-02 DIAGNOSIS — Z79899 Other long term (current) drug therapy: Secondary | ICD-10-CM | POA: Insufficient documentation

## 2020-09-02 DIAGNOSIS — M545 Low back pain, unspecified: Secondary | ICD-10-CM | POA: Diagnosis not present

## 2020-09-02 DIAGNOSIS — Z7901 Long term (current) use of anticoagulants: Secondary | ICD-10-CM | POA: Insufficient documentation

## 2020-09-02 MED ORDER — LIDOCAINE 5 % EX PTCH: 1.0000 | MEDICATED_PATCH | CUTANEOUS | 0 refills | Status: DC

## 2020-09-02 MED ORDER — CYCLOBENZAPRINE HCL 10 MG PO TABS: 10.0000 mg | ORAL_TABLET | Freq: Three times a day (TID) | ORAL | 0 refills | Status: DC | PRN

## 2020-09-02 MED ORDER — PREDNISONE 20 MG PO TABS: 40.0000 mg | ORAL_TABLET | Freq: Every day | ORAL | 0 refills | Status: DC

## 2020-09-02 NOTE — ED Provider Notes (Signed)
[facility] EMERGENCY DEPARTMENT Provider Note   CSN: [id number] Arrival date & time: 09/02/20  1627     History Chief Complaint  Patient presents with  . Back Pain    Xavier [name]. is a 55 y.o. male.  Patient presents to the ED with a chief complaint of low back pain.  Denies specific trauma, but states he might have done something to his back.  He states that it has been gradually worsening for the past month.  He reports seeing his cardiologist and endocrinologist, who told him he had good blood flow to his feet and that this is not thought to be diabetic neuropathy.  He states that the pain radiates down from his left low back into his leg and foot.  He denies bowel or bladder incontinence.  Denies any falls.  Denies abdominal pain or saddle anesthesia.  The history is provided by the patient. No language interpreter was used.       Past Medical History:  Diagnosis Date  . ANEMIA, PERNICIOUS 04/03/2007  . Anxiety and depression   . Arthritis    "left hand" (06/26/2016)  . Chronic lower back pain   . Colon cancer screening 10/27/2016   Cologuard NEG-->rpt 3 yrs  . CORONARY ARTERY DISEASE 12/05/2006  . DIABETES MELLITUS, TYPE II dx'd 2001  . DVT (deep venous thrombosis) (HCC) 2016   BLE  . GERD (gastroesophageal reflux disease)   . History of balanitis    x 2. Holding off on circ as of 05/2020 urol f/u  . History of blood transfusion 2001   "when I had my hand OR"  . History of gout   . History of kidney stones   . HYPERCHOLESTEROLEMIA 07/17/2007  . HYPERTENSION 12/05/2006  . Learning disability   . Migraine    "1-2/month" (06/26/2016)  . Myocardial infarction (HCC) ~ 2003  . Obesity, Class II, BMI 35-39.9   . OSA on CPAP    Pt cannot recall setting clearly but thinks it is 5 cm h20.    . PE (pulmonary embolism) 2016  . Stroke [facility]) ~ 2006   denies residual on 06/26/2016  . TIA (transient ischemic attack) ?06/25/2016  . VISUAL ACUITY, DECREASED,  RIGHT EYE 02/19/2009    Patient Active Problem List   Diagnosis Date Noted  . Shoulder pain, right 09/19/2018  . Vitamin B 12 deficiency 01/10/2018  . Type 2 diabetes mellitus without complication, without long-term current use of insulin (HCC)   . Abnormal dobutamine stress echocardiogram   . Chronic back pain 06/15/2016  . Screening examination for infectious disease 06/15/2016  . Obesity 03/29/2016  . OSA (obstructive sleep apnea) 09/06/2015  . Hypersomnia with sleep apnea 07/08/2015  . RLS (restless legs syndrome) 07/08/2015  . Cramp of both lower extremities 06/23/2015  . Personal history of DVT (deep vein thrombosis) 06/23/2015  . History of snoring 06/23/2015  . Excessive somnolence disorder 06/23/2015  . Acute pulmonary embolism (HCC) 06/23/2015  . Obesity, morbid (HCC) 12/25/2014  . Cellulitis 12/17/2013  . 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 Depressive disorder 12/03/2013  . Pulmonary embolism (HCC) 11/26/2013  . Screening for prostate cancer 09/24/2012  . Encounter for long-term (current) use of other medications 09/24/2012  . HYPERCHOLESTEROLEMIA 07/17/2007  . ANEMIA, PERNICIOUS 04/03/2007  . Diabetes (HCC) 12/05/2006  . Essential hypertension 12/05/2006  . Coronary atherosclerosis 12/05/2006    Past Surgical History:  Procedure Laterality Date  . CARDIAC CATHETERIZATION  1990s;  2012; 06/2016   06/2016 distal LAD DES stent.  EF 55-65%  . CARDIOVASCULAR STRESS TEST  06/2016   Echo stress test->"inconclusive"-->cath was done after this  . CATARACT EXTRACTION W/ INTRAOCULAR LENS IMPLANT Right 2017  . CORONARY STENT INTERVENTION N/A 06/27/2016   Procedure: Coronary Stent Intervention;  Surgeon: Marykay Lex, MD;  Location: [facility] INVASIVE CV LAB;  Service: Cardiovascular;  Laterality: N/A;  . EYE SURGERY Right    Cataract  . INGUINAL HERNIA REPAIR Bilateral 1990s  . INTRAVASCULAR PRESSURE WIRE/FFR STUDY N/A 06/27/2016    Procedure: Intravascular Pressure Wire/FFR Study;  Surgeon: Marykay Lex, MD;  Location: [facility] INVASIVE CV LAB;  Service: Cardiovascular;  Laterality: N/A;  . LEFT HEART CATH AND CORONARY ANGIOGRAPHY N/A 06/27/2016   Procedure: Left Heart Cath and Coronary Angiography;  Surgeon: Marykay Lex, MD;  Location: [facility] INVASIVE CV LAB;  Service: Cardiovascular;  Laterality: N/A;  . REATTACHMENT HAND Left ~ 2001   w/carpal tunnel release  . UMBILICAL HERNIA REPAIR  1990s   w/IHR       Family History  Problem Relation Age of Onset  . COPD Mother   . Lymphoma Father     Social History   Tobacco Use  . Smoking status: Former Smoker    Packs/day: 0.10    Years: 3.00    Pack years: 0.30    Types: Cigarettes    Start date: 9    Quit date: 05/15/1984    Years since quitting: 36.3  . Smokeless tobacco: Never Used  Vaping Use  . Vaping Use: Never used  Substance Use Topics  . Alcohol use: No  . Drug use: No    Home Medications Prior to Admission medications   Medication Sig Start Date End Date Taking? Authorizing Provider  acetaminophen (TYLENOL) 500 MG tablet Take 1,000 mg by mouth every 6 (six) hours as needed.    [provider]  bromocriptine (PARLODEL) 2.5 MG tablet TAKE 1 TABLET (2.5 MG TOTAL) BY MOUTH DAILY. TAKE 1/4 TABLET BY MOUTH EVERY DAY 08/22/20   Romero Belling, MD  clopidogrel (PLAVIX) 75 MG tablet Take 1 tablet (75 mg total) by mouth daily. 08/18/20   Wendall Stade, MD  Continuous Blood Gluc Receiver (FREESTYLE LIBRE 2 READER) DEVI Check sugar daily. E11.9 08/31/20   Romero Belling, MD  Continuous Blood Gluc Sensor (FREESTYLE LIBRE 2 SENSOR) MISC 1 Device by Does not apply route every 14 (fourteen) days. 08/20/20   Romero Belling, MD  Cyanocobalamin (VITAMIN B12 PO) Take by mouth daily.    [provider]  diazepam (VALIUM) 10 MG tablet Take 10 mg by mouth 3 (three) times daily. 05/23/16   [provider]  DULoxetine (CYMBALTA) 30 MG capsule Take 30 mg  by mouth daily.    [provider]  EPINEPHrine 0.3 mg/0.3 mL IJ SOAJ injection Inject 0.3 mLs (0.3 mg total) into the muscle as needed for anaphylaxis. 01/20/20   McGowen, Maryjean Morn, MD  fenofibrate (TRICOR) 145 MG tablet TAKE 1 TABLET BY MOUTH EVERY DAY 05/13/20   McGowen, Maryjean Morn, MD  fluconazole (DIFLUCAN) 150 MG tablet TAKE 1 TAB BY MOUTH EVERY 7 DAYS 08/19/20   McGowen, Maryjean Morn, MD  FLUoxetine (PROZAC) 40 MG capsule Take 40 mg by mouth daily.    [provider]  fluticasone (FLONASE) 50 MCG/ACT nasal spray USE ONE SPRAY IN EACH NOSTRIL TWICE A DAY 01/30/18   Romero Belling, MD  hydrochlorothiazide (HYDRODIURIL) 12.5 MG tablet TAKE 1 TABLET BY  MOUTH EVERY DAY WITH THE LOSARTAN 05/13/20   McGowen, Maryjean MornPhilip H, MD  JARDIANCE 25 MG TABS tablet TAKE 1 TABLET BY MOUTH EVERY DAY 08/17/20   Romero BellingEllison, Sean, MD  losartan (COZAAR) 100 MG tablet TAKE 1/2 TABLET BY MOUTH EVERY DAY 08/19/20   McGowen, Maryjean MornPhilip H, MD  metFORMIN (GLUCOPHAGE-XR) 500 MG 24 hr tablet TAKE 2 TABLETS BY MOUTH TWICE A DAY 08/17/20   Romero BellingEllison, Sean, MD  nitroGLYCERIN (NITROSTAT) 0.4 MG SL tablet Place 1 tablet (0.4 mg total) under the tongue every 5 (five) minutes as needed for chest pain. 01/20/20   McGowen, Maryjean MornPhilip H, MD  nystatin cream (MYCOSTATIN) Apply 1 application topically 2 (two) times daily. 04/13/20   McGowen, Maryjean MornPhilip H, MD  ONETOUCH VERIO test strip USE TO CHECK BLOOD SUGAR 1 TIME PER DAY. DX CODE: E11.9 06/21/17   Romero BellingEllison, Sean, MD  OZEMPIC, 1 MG/DOSE, 2 MG/1.5ML SOPN INJECT 1 MG INTO THE SKIN ONCE A WEEK. 12/14/19   Romero BellingEllison, Sean, MD  rosuvastatin (CRESTOR) 5 MG tablet TAKE 1 TABLET BY MOUTH EVERY DAY 05/13/20   McGowen, Maryjean MornPhilip H, MD  traMADol (ULTRAM) 50 MG tablet Take 1-2 tabs at bedtime prn pain 10/14/18   Ralene Corkraper, Timothy R, DO  XARELTO 20 MG TABS tablet TAKE 1 TABLET BY MOUTH EVERY DAY BEFORE SUPPER 08/17/20   Glenford BayleyWalsh, Elizabeth W, NP    Allergies    Actos [pioglitazone], Bee venom, Klonopin [clonazepam], Invokana  [canagliflozin], and Niacin  Review of Systems   Review of Systems  All other systems reviewed and are negative.   Physical Exam Updated Vital Signs BP 123/83 (BP Location: Left Arm)   Pulse 70   Temp 98 F (36.7 C) (Oral)   Resp 18   SpO2 97%   Physical Exam Vitals and nursing note reviewed.  Constitutional:      Appearance: He is well-developed.  HENT:     Head: Normocephalic and atraumatic.  Eyes:     Conjunctiva/sclera: Conjunctivae normal.  Cardiovascular:     Rate and Rhythm: Normal rate and regular rhythm.     Heart sounds: No murmur heard.   Pulmonary:     Effort: Pulmonary effort is normal. No respiratory distress.     Breath sounds: Normal breath sounds.  Abdominal:     Palpations: Abdomen is soft.     Tenderness: There is no abdominal tenderness.  Musculoskeletal:        General: No swelling, deformity or signs of injury.     Cervical back: Neck supple.     Right lower leg: No edema.     Left lower leg: No edema.     Comments: Left lumbar paraspinal muscles TTP Normal ROM and strength of BLE  Skin:    General: Skin is warm and dry.  Neurological:     Mental Status: He is alert and oriented to person, place, and time.     Comments: Normal objective sensation in lower extremities Normal patellar reflexes bilaterally  Psychiatric:        Mood and Affect: Mood normal.        Behavior: Behavior normal.     ED Results / Procedures / Treatments   Labs (all labs ordered are listed, but only abnormal results are displayed) Labs Reviewed - No data to display  EKG None  Radiology DG Lumbar Spine Complete  Result Date: 09/02/2020 CLINICAL DATA:  Low back pain EXAM: LUMBAR SPINE - COMPLETE 4+ VIEW COMPARISON:  None. FINDINGS: Early disc space narrowing and spurring  at L5-S1. Normal alignment. No fracture. SI joints symmetric and unremarkable. IMPRESSION: Early degenerative disc disease at L5-S1. No acute findings. Electronically Signed   By: Charlett Nose  M.D.   On: 09/02/2020 17:14    Procedures Procedures   Medications Ordered in ED Medications - No data to display  ED Course  I have reviewed the triage vital signs and the nursing notes.  Pertinent labs & imaging results that were available during my care of the patient were reviewed by me and considered in my medical decision making (see chart for details).    MDM Rules/Calculators/A&P                          Patient with back pain.    No neurological deficits and normal neuro exam.  Patient is ambulatory.  No loss of bowel or bladder control.  Doubt cauda equina.  Denies fever,  doubt epidural abscess or other lesion. Recommend back exercises, stretching, RICE, and will treat with a short course of prednisone and flexeril.  Consultants: none  Review of prior charts confirm recent evaluations by cards and endocrine.  Encouraged the patient that there could be a need for additional workup and/or imaging such as MRI, if the symptoms do not resolve. Patient advised that if the back pain does not resolve, or radiates, this could progress to more serious conditions and is encouraged to follow-up with PCP or orthopedics within 2 weeks.    Final Clinical Impression(s) / ED Diagnoses Final diagnoses:  Acute left-sided low back pain with left-sided sciatica    Rx / DC Orders ED Discharge Orders         Ordered    predniSONE (DELTASONE) 20 MG tablet  Daily        09/02/20 2313    cyclobenzaprine (FLEXERIL) 10 MG tablet  3 times daily PRN        09/02/20 2313    lidocaine (LIDODERM) 5 %  Every 24 hours        09/02/20 2313           Roxy Horseman, PA-C 09/02/20 2318    Dione Booze, MD 09/03/20 [phone]

## 2020-09-02 NOTE — ED Triage Notes (Signed)
Pt c/o left back pain radiating into left leg with left foot numbness, started a month ago. Pt states he was seen for same and was told he has good blood flow to leg.

## 2020-09-02 NOTE — Discharge Instructions (Signed)
Your x-ray shows degenerative changes in your back at the L5/S1 level.  This is likely contributing to your symptoms.  I recommend that you be seen by a back specialist.    Keep a close eye on your blood sugar while taking the prednisone.   Return to the ER if you have frequent falls or lose control of you bladder or bowels.

## 2020-09-02 NOTE — ED Triage Notes (Signed)
Emergency Medicine Provider Triage Evaluation Note  Xavier [name]. , a 55 y.o. male  was evaluated in triage.  Pt complains of low back pain, radiates to left foot, left foot numb. Seen by vascular, normal flow to leg, has not seen ortho/spine. No falls, no loss bowel/bladder control, no abdominal pain, no groin numbness.   Review of Systems  Positive: Low back pain Negative: Red flags as above  Physical Exam  BP 116/89 (BP Location: Left Arm)   Pulse 80   Temp 98 F (36.7 C) (Oral)   Resp 19   SpO2 99%  Gen:   Awake, no distress   HEENT:  Atraumatic  Resp:  Normal effort  Cardiac:  Normal rate  Abd:   Nondistended, nontender  MSK:   Moves extremities without difficulty, TTP L4-5 and left paraspinous Neuro:  Speech clear   Medical Decision Making  Medically screening exam initiated at 4:37 PM.  Appropriate orders placed.  Xavier [name]. was informed that the remainder of the evaluation will be completed by another provider, this initial triage assessment does not replace that evaluation, and the importance of remaining in the ED until their evaluation is complete.  Clinical Impression     Xavier Fend, PA-C 09/02/20 1641

## 2020-09-03 NOTE — Telephone Encounter (Signed)
Meter was not covered by insurance because of coding.  Per pharmacy and patient meter needs to be coded so that  Medicare Part B will pay

## 2020-09-06 ENCOUNTER — Telehealth: Payer: Self-pay | Admitting: Endocrinology

## 2020-09-06 ENCOUNTER — Other Ambulatory Visit: Payer: Self-pay | Admitting: *Deleted

## 2020-09-06 DIAGNOSIS — E119 Type 2 diabetes mellitus without complications: Secondary | ICD-10-CM

## 2020-09-06 MED ORDER — FREESTYLE LIBRE 2 READER DEVI: 0 refills | Status: DC

## 2020-09-06 NOTE — Telephone Encounter (Signed)
Resend rx to the pharmacy with code E11.9

## 2020-09-06 NOTE — Telephone Encounter (Signed)
Pt calling regarding Continuous Blood Gluc Sensor (FREESTYLE LIBRE 2 SENSOR) MISC pt went to the pharmacy and needs it filed under medicare part B so the pharmacy can fill it for the pt.  If any questions pt said the best # number to reach him back at it [phone] which is his wife's number.   [id number] [place], [place] - [address] [place]

## 2020-09-07 NOTE — Telephone Encounter (Signed)
Patient called again re: Patient states the RX for Continuous Blood Gluc Sensor (FREESTYLE LIBRE 2 SENSOR) MISC needs to be re-coded per insurance and resent to:  [id number] [place], [place] - [address] Phone:  [phone]  Fax:  [phone]     ([facility] the re-coded RX for the Freestyle Libre 2 Meter but did not receive the re-coded RX for the [employer])

## 2020-09-08 ENCOUNTER — Encounter: Payer: Self-pay | Admitting: Endocrinology

## 2020-09-09 ENCOUNTER — Other Ambulatory Visit: Payer: Self-pay | Admitting: Endocrinology

## 2020-09-09 ENCOUNTER — Other Ambulatory Visit: Payer: Self-pay | Admitting: *Deleted

## 2020-09-09 DIAGNOSIS — E119 Type 2 diabetes mellitus without complications: Secondary | ICD-10-CM

## 2020-09-09 MED ORDER — FREESTYLE LIBRE 2 SENSOR MISC: 1.0000 | 3 refills | Status: DC

## 2020-09-10 ENCOUNTER — Other Ambulatory Visit: Payer: Self-pay

## 2020-09-10 ENCOUNTER — Ambulatory Visit
Admission: RE | Admit: 2020-09-10 | Discharge: 2020-09-10 | Disposition: A | Discharge status: Home / Self Care | Payer: Medicare Other | Source: Ambulatory Visit | Attending: Cardiovascular Disease | Admitting: Cardiovascular Disease

## 2020-09-10 DIAGNOSIS — I2510 Atherosclerotic heart disease of native coronary artery without angina pectoris: Secondary | ICD-10-CM | POA: Diagnosis not present

## 2020-09-10 DIAGNOSIS — M954 Acquired deformity of chest and rib: Secondary | ICD-10-CM | POA: Diagnosis not present

## 2020-09-10 DIAGNOSIS — J9811 Atelectasis: Secondary | ICD-10-CM | POA: Diagnosis not present

## 2020-09-10 DIAGNOSIS — R0789 Other chest pain: Secondary | ICD-10-CM

## 2020-09-10 DIAGNOSIS — I700 Atherosclerosis of aorta: Secondary | ICD-10-CM | POA: Diagnosis not present

## 2020-09-13 ENCOUNTER — Other Ambulatory Visit: Payer: Self-pay

## 2020-09-13 ENCOUNTER — Ambulatory Visit (HOSPITAL_COMMUNITY)
Admission: EM | Admit: 2020-09-13 | Discharge: 2020-09-13 | Disposition: A | Discharge status: Home / Self Care | Payer: Medicare Other | Attending: Internal Medicine | Admitting: Internal Medicine

## 2020-09-13 ENCOUNTER — Encounter: Payer: Self-pay | Admitting: Family Medicine

## 2020-09-13 ENCOUNTER — Encounter (HOSPITAL_COMMUNITY): Payer: Self-pay

## 2020-09-13 DIAGNOSIS — R21 Rash and other nonspecific skin eruption: Secondary | ICD-10-CM | POA: Diagnosis not present

## 2020-09-13 DIAGNOSIS — E119 Type 2 diabetes mellitus without complications: Secondary | ICD-10-CM

## 2020-09-13 LAB — CBC WITH DIFFERENTIAL/PLATELET
Abs Immature Granulocytes: 0.12 10*3/uL — ABNORMAL HIGH (ref 0.00–0.07)
Basophils Absolute: 0.1 10*3/uL (ref 0.0–0.1)
Basophils Relative: 1 %
Eosinophils Absolute: 0.2 10*3/uL (ref 0.0–0.5)
Eosinophils Relative: 3 %
HCT: 45.8 % (ref 39.0–52.0)
Hemoglobin: 15.1 g/dL (ref 13.0–17.0)
Immature Granulocytes: 2 %
Lymphocytes Relative: 30 %
Lymphs Abs: 2 10*3/uL (ref 0.7–4.0)
MCH: 30.6 pg (ref 26.0–34.0)
MCHC: 33 g/dL (ref 30.0–36.0)
MCV: 92.9 fL (ref 80.0–100.0)
Monocytes Absolute: 0.6 10*3/uL (ref 0.1–1.0)
Monocytes Relative: 9 %
Neutro Abs: 3.5 10*3/uL (ref 1.7–7.7)
Neutrophils Relative %: 55 %
Platelets: 412 10*3/uL — ABNORMAL HIGH (ref 150–400)
RBC: 4.93 MIL/uL (ref 4.22–5.81)
RDW: 13.2 % (ref 11.5–15.5)
WBC: 6.5 10*3/uL (ref 4.0–10.5)
nRBC: 0 % (ref 0.0–0.2)

## 2020-09-13 LAB — COMPREHENSIVE METABOLIC PANEL
ALT: 26 U/L (ref 0–44)
AST: 20 U/L (ref 15–41)
Albumin: 4.2 g/dL (ref 3.5–5.0)
Alkaline Phosphatase: 41 U/L (ref 38–126)
Anion gap: 7 (ref 5–15)
BUN: 17 mg/dL (ref 6–20)
CO2: 26 mmol/L (ref 22–32)
Calcium: 9.8 mg/dL (ref 8.9–10.3)
Chloride: 99 mmol/L (ref 98–111)
Creatinine, Ser: 1.31 mg/dL — ABNORMAL HIGH (ref 0.61–1.24)
GFR, Estimated: >60 mL/min (ref >60)
Glucose, Bld: 139 mg/dL — ABNORMAL HIGH (ref 70–99)
Potassium: 5 mmol/L (ref 3.5–5.1)
Sodium: 132 mmol/L — ABNORMAL LOW (ref 135–145)
Total Bilirubin: 0.6 mg/dL (ref 0.3–1.2)
Total Protein: 7.1 g/dL (ref 6.5–8.1)

## 2020-09-13 LAB — SEDIMENTATION RATE: Sed Rate: 1 mm/hr (ref 0–16)

## 2020-09-13 LAB — C-REACTIVE PROTEIN: CRP: 0.9 mg/dL (ref <1.0)

## 2020-09-13 MED ORDER — ONDANSETRON 4 MG PO TBDP: 4.0000 mg | ORAL_TABLET | Freq: Three times a day (TID) | ORAL | 0 refills | Status: DC | PRN

## 2020-09-13 MED ORDER — ONDANSETRON 4 MG PO TBDP
ORAL_TABLET | ORAL | Status: AC
  Filled 2020-09-13: qty 1

## 2020-09-13 MED ORDER — ACCU-CHEK GUIDE ME W/DEVICE KIT: PACK | 0 refills | Status: DC

## 2020-09-13 MED ORDER — ONDANSETRON 4 MG PO TBDP
4.0000 mg | ORAL_TABLET | Freq: Once | ORAL | Status: AC
  Administered 2020-09-13: 4 mg via ORAL

## 2020-09-13 MED ORDER — ACCU-CHEK GUIDE VI STRP: ORAL_STRIP | 12 refills | Status: DC

## 2020-09-13 MED ORDER — ACCU-CHEK SOFTCLIX LANCETS MISC: 12 refills | Status: DC

## 2020-09-13 NOTE — Telephone Encounter (Addendum)
FYI. Please see below.  [facility] Day - Client TELEPHONE ADVICE RECORD Access[employer]urse Patient Name: Xavier [name] Gender: Male DOB: 06-Oct-1965 Age: 55 Y 2 M 21 D Return Phone Number: [phone] (Primary), [phone] (Secondary) Address: [place]  [address] Client [facility] Day - Client Client Site  [facility] [place] - Day Physician Santiago Bumpers - MD Contact Type Call Who Is Calling Patient / Member / Family / Caregiver Call Type Triage / Clinical Caller Name Xavier [name] Relationship To Patient Spouse Return Phone Number [phone] (Secondary) Chief Complaint Unclassified Symptom Reason for Call Symptomatic / Request for Health Information Initial Comment Caller states her husband has some redness and she is worried that it might be a blood clot. Translation No Nurse Assessment Nurse: Thana Farr, RN, Ladona Ridgel Date/Time Lamount Cohen Time): 09/13/2020 10:09:07 AM Confirm and document reason for call. If symptomatic, describe symptoms. ---Pt having red splotchy raised under skin in mid back area. Symptoms started this morning. Was seen in ED for lower back pain and numbness. Rx prednisone and muscle relaxers and lidocaine patches. Finished prednisone and not taking other medications. Drinking and urinating normally. Denies chest pain, difficulty breathing, itchiness, pain, fever, or any other symptoms at this time. Does the patient have any new or worsening symptoms? ---Yes Will a triage be completed? ---Yes Related visit to physician within the last 2 weeks? ---Yes Does the PT have any chronic conditions? (i.e. diabetes, asthma, this includes High risk factors for pregnancy, etc.) ---Yes List chronic conditions. ---HTN, Diabetes Is this a behavioral health or substance abuse call? ---No Guidelines Guideline Title Affirmed Question Affirmed Notes Nurse Date/Time (Eastern Time) Rash or Redness  - Localized [1] Localized purple or blood-colored spots or dots AND [2] not from injury or [facility] 09/13/2020 10:13:51 AM PLEASE NOTE: All timestamps contained within this report are represented as [place] Standard Time. CONFIDENTIALTY NOTICE: This fax transmission is intended only for the addressee. It contains information that is legally privileged, confidential or otherwise protected from use or disclosure. If you are not the intended recipient, you are strictly prohibited from reviewing, disclosing, copying using or disseminating any of this information or taking any action in reliance [name] or regarding this information. If you have received this fax in error, please notify us immediately by telephone so that we can arrange for its return to [place]. Phone: [phone], Toll-Free: [phone], Fax: [phone] Page: 2 of 2 Call Id: [id number] Guidelines Guideline Title Affirmed Question Affirmed Notes Nurse Date/Time Lamount Cohen Time) friction AND [3] no fever Disp. Time Lamount Cohen Time) Disposition Final User 09/13/2020 10:15:43 AM See HCP within 4 Hours (or PCP triage) Yes Thana Farr, RN, Ursula Beath Disagree/Comply Comply Caller Understands Yes PreDisposition Call Doctor Care Advice Given Per Guideline SEE HCP (OR PCP TRIAGE) WITHIN 4 HOURS: * You become worse CALL BACK IF: Comments User: Meade Maw, RN Date/Time (Eastern Time): 09/13/2020 10:23:03 AM Contacted back line and no appts open. Advised pt be seen @ [facility] within 4 hours. Caller verbalizes understanding. Referrals GO TO FACILITY UNDECIDED

## 2020-09-13 NOTE — Telephone Encounter (Signed)
Noted  

## 2020-09-13 NOTE — ED Provider Notes (Signed)
[place]    CSN: [id number] Arrival date & time: 09/13/20  1133      History   Chief Complaint Chief Complaint  Patient presents with  . Rash    HPI Xavier [name]. is a 55 y.o. male with a history of pulmonary embolism on Xarelto comes to urgent care with a 2-day history of rash over the mid back.  Rash is nontender.  There is no vesiculation.  It is associated with some mild erythema.  No fever or chills.  No trauma to the back.  Patient is complaining of some nausea but no vomiting.  No fever or chills.  No diarrhea.  Patient denies upper respiratory infection symptoms.  No sick contacts.   HPI  Past Medical History:  Diagnosis Date  . ANEMIA, PERNICIOUS 04/03/2007  . Anxiety and depression   . Arthritis    "left hand" (06/26/2016)  . Chronic lower back pain   . Colon cancer screening 10/27/2016   Cologuard NEG-->rpt 3 yrs  . CORONARY ARTERY DISEASE 12/05/2006  . DIABETES MELLITUS, TYPE II dx'd 2001  . DVT (deep venous thrombosis) ([place]) 2016   BLE  . GERD (gastroesophageal reflux disease)   . History of balanitis    x 2. Holding off on circ as of 05/2020 urol f/u  . History of blood transfusion 2001   "when I had my hand OR"  . History of gout   . History of kidney stones   . HYPERCHOLESTEROLEMIA 07/17/2007  . HYPERTENSION 12/05/2006  . Learning disability   . Migraine    "1-2/month" (06/26/2016)  . Myocardial infarction ([place]) ~ 2003  . Obesity, Class II, BMI 35-39.9   . OSA on CPAP    Pt cannot recall setting clearly but thinks it is 5 cm h20.    . PE (pulmonary embolism) 2016  . Stroke [facility]) ~ 2006   denies residual on 06/26/2016  . TIA (transient ischemic attack) ?06/25/2016  . VISUAL ACUITY, DECREASED, RIGHT EYE 02/19/2009    Patient Active Problem List   Diagnosis Date Noted  . Shoulder pain, right 09/19/2018  . Vitamin B 12 deficiency 01/10/2018  . Type 2 diabetes mellitus without complication, without long-term current use of insulin ([place])    . Abnormal dobutamine stress echocardiogram   . Chronic back pain 06/15/2016  . Screening examination for infectious disease 06/15/2016  . Obesity 03/29/2016  . OSA (obstructive sleep apnea) 09/06/2015  . Hypersomnia with sleep apnea 07/08/2015  . RLS (restless legs syndrome) 07/08/2015  . Cramp of both lower extremities 06/23/2015  . Personal history of DVT (deep vein thrombosis) 06/23/2015  . History of snoring 06/23/2015  . Excessive somnolence disorder 06/23/2015  . Acute pulmonary embolism ([place]) 06/23/2015  . Obesity, morbid ([place]) 12/25/2014  . Cellulitis 12/17/2013  . Amputation, arm/hand, unilateral, traumatic, with complication ([place]) 21/30/8657  . Depressive disorder 12/03/2013  . Pulmonary embolism ([place]) 11/26/2013  . Screening for prostate cancer 09/24/2012  . Encounter for long-term (current) use of other medications 09/24/2012  . HYPERCHOLESTEROLEMIA 07/17/2007  . ANEMIA, PERNICIOUS 04/03/2007  . Diabetes ([place]) 12/05/2006  . Essential hypertension 12/05/2006  . Coronary atherosclerosis 12/05/2006    Past Surgical History:  Procedure Laterality Date  . CARDIAC CATHETERIZATION  1990s; 2012; 06/2016   06/2016 distal LAD DES stent.  EF 55-65%  . CARDIOVASCULAR STRESS TEST  06/2016   Echo stress test->"inconclusive"-->cath was done after this  . CATARACT EXTRACTION W/ INTRAOCULAR LENS IMPLANT Right 2017  . CORONARY  STENT INTERVENTION N/A 06/27/2016   Procedure: Coronary Stent Intervention;  Surgeon: Leonie Man, MD;  Location: [place] [facility];  Service: Cardiovascular;  Laterality: N/A;  . EYE SURGERY Right    Cataract  . INGUINAL HERNIA REPAIR Bilateral 1990s  . INTRAVASCULAR PRESSURE WIRE/FFR STUDY N/A 06/27/2016   Procedure: Intravascular Pressure Wire/FFR Study;  Surgeon: Leonie Man, MD;  Location: [place] [facility];  Service: Cardiovascular;  Laterality: N/A;  . LEFT HEART CATH AND CORONARY ANGIOGRAPHY N/A 06/27/2016   Procedure: Left Heart Cath and  Coronary Angiography;  Surgeon: Leonie Man, MD;  Location: [place] [facility];  Service: Cardiovascular;  Laterality: N/A;  . REATTACHMENT HAND Left ~ 2001   w/carpal tunnel release  . UMBILICAL HERNIA REPAIR  1990s   w/IHR       Home Medications    Prior to Admission medications   Medication Sig Start Date End Date Taking? Authorizing Provider  ondansetron (ZOFRAN ODT) 4 MG disintegrating tablet Take 1 tablet (4 mg total) by mouth every 8 (eight) hours as needed for nausea or vomiting. 09/13/20  Yes Kosei Rhodes, Myrene Galas, MD  acetaminophen (TYLENOL) 500 MG tablet Take 1,000 mg by mouth every 6 (six) hours as needed.    [provider]  bromocriptine (PARLODEL) 2.5 MG tablet TAKE 1 TABLET (2.5 MG TOTAL) BY MOUTH DAILY. TAKE 1/4 TABLET BY MOUTH EVERY DAY 08/22/20   [name], MD  clopidogrel (PLAVIX) 75 MG tablet Take 1 tablet (75 mg total) by mouth daily. 08/18/20   Josue Hector, MD  Continuous Blood Gluc Receiver (FREESTYLE LIBRE 2 READER) DEVI Check sugar once a day. E11.9 09/06/20   [name], MD  Continuous Blood Gluc Sensor (FREESTYLE LIBRE 2 SENSOR) MISC 1 Device by Does not apply route every 14 (fourteen) days. E11.9 09/09/20   [name], MD  Cyanocobalamin (VITAMIN B12 PO) Take by mouth daily.    [provider]  cyclobenzaprine (FLEXERIL) 10 MG tablet Take 1 tablet (10 mg total) by mouth 3 (three) times daily as needed for muscle spasms. 09/02/20   Montine Circle, PA-C  diazepam (VALIUM) 10 MG tablet Take 10 mg by mouth 3 (three) times daily. 05/23/16   [provider]  DULoxetine (CYMBALTA) 30 MG capsule Take 30 mg by mouth daily.    [provider]  EPINEPHrine 0.3 mg/0.3 mL IJ SOAJ injection Inject 0.3 mLs (0.3 mg total) into the muscle as needed for anaphylaxis. 01/20/20   McGowen, Adrian Blackwater, MD  fenofibrate (TRICOR) 145 MG tablet TAKE 1 TABLET BY MOUTH EVERY DAY 05/13/20   McGowen, Adrian Blackwater, MD  fluconazole (DIFLUCAN) 150 MG tablet TAKE  1 TAB BY MOUTH EVERY 7 DAYS 08/19/20   McGowen, Adrian Blackwater, MD  FLUoxetine (PROZAC) 40 MG capsule Take 40 mg by mouth daily.    [provider]  fluticasone (FLONASE) 50 MCG/ACT nasal spray USE ONE SPRAY IN EACH NOSTRIL TWICE A DAY 01/30/18   [name], MD  hydrochlorothiazide (HYDRODIURIL) 12.5 MG tablet TAKE 1 TABLET BY MOUTH EVERY DAY WITH THE LOSARTAN 05/13/20   McGowen, Adrian Blackwater, MD  JARDIANCE 25 MG TABS tablet TAKE 1 TABLET BY MOUTH EVERY DAY 08/17/20   [name], MD  lidocaine (LIDODERM) 5 % Place 1 patch onto the skin daily. Remove & Discard patch within 12 hours or as directed by MD 09/02/20   Montine Circle, PA-C  losartan (COZAAR) 100 MG tablet TAKE 1/2 TABLET BY MOUTH EVERY DAY 08/19/20  Tammi Sou, MD  metFORMIN (GLUCOPHAGE-XR) 500 MG 24 hr tablet TAKE 2 TABLETS BY MOUTH TWICE A DAY 08/17/20   [name], MD  nitroGLYCERIN (NITROSTAT) 0.4 MG SL tablet Place 1 tablet (0.4 mg total) under the tongue every 5 (five) minutes as needed for chest pain. 01/20/20   McGowen, Adrian Blackwater, MD  nystatin cream (MYCOSTATIN) Apply 1 application topically 2 (two) times daily. 04/13/20   McGowen, Adrian Blackwater, MD  ONETOUCH VERIO test strip USE TO CHECK BLOOD SUGAR 1 TIME PER DAY. DX CODE: E11.9 06/21/17   [name], MD  OZEMPIC, 1 MG/DOSE, 2 MG/1.5ML SOPN INJECT 1 MG INTO THE SKIN ONCE A WEEK. 12/14/19   [name], MD  predniSONE (DELTASONE) 20 MG tablet Take 2 tablets (40 mg total) by mouth daily. Take 40 mg by mouth daily for 3 days, then 51m by mouth daily for 3 days, then 161mdaily for 3 days 09/02/20   BrMontine CirclePA-C  rosuvastatin (CRESTOR) 5 MG tablet TAKE 1 TABLET BY MOUTH EVERY DAY 05/13/20   McGowen, PhAdrian BlackwaterMD  traMADol (UVeatrice [place]50 MG tablet Take 1-2 tabs at bedtime prn pain 10/14/18   DrThurman CoyerDO  XARELTO 20 MG TABS tablet TAKE 1 TABLET BY MOUTH EVERY DAY BEFORE SUPPER 08/17/20   WaMartyn EhrichNP    Family History Family History  Problem Relation Age of  Onset  . COPD Mother   . Lymphoma Father     Social History Social History   Tobacco Use  . Smoking status: Former Smoker    Packs/day: 0.10    Years: 3.00    Pack years: 0.30    Types: Cigarettes    Start date: 1923  Quit date: 05/15/1984    Years since quitting: 36.3  . Smokeless tobacco: Never Used  Vaping Use  . Vaping Use: Never used  Substance Use Topics  . Alcohol use: No  . Drug use: No     Allergies   Actos [pioglitazone], Bee venom, Klonopin [clonazepam], Invokana [canagliflozin], and Niacin   Review of Systems Review of Systems  Respiratory: Negative.   Cardiovascular: Negative.   Gastrointestinal: Negative.   Musculoskeletal: Negative for arthralgias and back pain.  Skin: Positive for color change and rash.     Physical Exam Triage Vital Signs ED Triage Vitals [09/13/20 1140]  Enc Vitals Group     BP 120/75     Pulse Rate 68     Resp 18     Temp 98 F (36.7 C)     Temp src      SpO2 99 %     Weight      Height      Head Circumference      Peak Flow      Pain Score 0     Pain Loc      Pain Edu?      [place]   No data found.  Updated Vital Signs BP 120/75   Pulse 68   Temp 98 F (36.7 C)   Resp 18   SpO2 99%   Visual Acuity Right Eye Distance:   Left Eye Distance:   Bilateral Distance:    Right Eye Near:   Left Eye Near:    Bilateral Near:     Physical Exam   UC Treatments / Results  Labs (all labs ordered are listed, but only abnormal results are displayed) Labs Reviewed  CBC WITH DIFFERENTIAL/PLATELET -  Abnormal; Notable for the following components:      Result Value   Platelets 412 (*)    Abs Immature Granulocytes 0.12 (*)    All other components within normal limits  COMPREHENSIVE METABOLIC PANEL - Abnormal; Notable for the following components:   Sodium 132 (*)    Glucose, Bld 139 (*)    Creatinine, Ser 1.31 (*)    All other components within normal limits  C-REACTIVE PROTEIN  SEDIMENTATION RATE     EKG   Radiology No results found.  Procedures Procedures (including critical care time)  Medications Ordered in UC Medications  ondansetron (ZOFRAN-ODT) disintegrating tablet 4 mg (4 mg Oral Given 09/13/20 1223)    Initial Impression / Assessment and Plan / UC Course  I have reviewed the triage vital signs and the nursing notes.  Pertinent labs & imaging results that were available during my care of the patient were reviewed by me and considered in my medical decision making (see chart for details).     1.  Rash: CBC, BMP, ESR and CRP to rule out vasculitis Patient is advised to observe the rash closely and return to urgent care if there is worsening pain in the back or the abdomen, spreading of the rash, fever or chills. No signs or symptoms of retroperitoneal hematoma.  We will call patient with recommendations if blood work is abnormal. Final Clinical Impressions(s) / UC Diagnoses   Final diagnoses:  Rash     Discharge Instructions     Please demarcate the boundaries of the rash Please monitor for the following-worsening rash, bruising, back pain, abdominal pain and/or persistent dizziness/lightheadedness. If you experience any of the above-mentioned symptoms, please go to the emergency department immediately to be evaluated further We will call you with recommendations if your lab results are abnormal Return to urgent care if you have worsening symptoms or if you have any further questions.   ED Prescriptions    Medication Sig Dispense Auth. Provider   ondansetron (ZOFRAN ODT) 4 MG disintegrating tablet Take 1 tablet (4 mg total) by mouth every 8 (eight) hours as needed for nausea or vomiting. 20 tablet Raed Schalk, Myrene Galas, MD     PDMP not reviewed this encounter.   Chase Picket, MD 09/13/20 [phone]

## 2020-09-13 NOTE — Discharge Instructions (Signed)
Please demarcate the boundaries of the rash Please monitor for the following-worsening rash, bruising, back pain, abdominal pain and/or persistent dizziness/lightheadedness. If you experience any of the above-mentioned symptoms, please go to the emergency department immediately to be evaluated further We will call you with recommendations if your lab results are abnormal Return to urgent care if you have worsening symptoms or if you have any further questions.

## 2020-09-13 NOTE — ED Triage Notes (Signed)
Pt presents with complaints of discoloration to his mid back that his wife noticed yesterday. Pt has a history of blood clots so they are very worried. Denies any injury. The area is not painful. Pt has chronic lower back pain he is being treated for that is unrelated.   Pt endorses feeling nauseous.

## 2020-09-15 ENCOUNTER — Emergency Department (HOSPITAL_COMMUNITY)
Admission: EM | Admit: 2020-09-15 | Discharge: 2020-09-16 | Disposition: A | Discharge status: Home / Self Care | Payer: Medicare Other | Attending: Emergency Medicine | Admitting: Emergency Medicine

## 2020-09-15 ENCOUNTER — Encounter (HOSPITAL_COMMUNITY): Payer: Self-pay | Admitting: Emergency Medicine

## 2020-09-15 ENCOUNTER — Other Ambulatory Visit: Payer: Self-pay

## 2020-09-15 DIAGNOSIS — S300XXA Contusion of lower back and pelvis, initial encounter: Secondary | ICD-10-CM | POA: Diagnosis not present

## 2020-09-15 DIAGNOSIS — I2510 Atherosclerotic heart disease of native coronary artery without angina pectoris: Secondary | ICD-10-CM | POA: Diagnosis not present

## 2020-09-15 DIAGNOSIS — Z87828 Personal history of other (healed) physical injury and trauma: Secondary | ICD-10-CM | POA: Diagnosis not present

## 2020-09-15 DIAGNOSIS — I10 Essential (primary) hypertension: Secondary | ICD-10-CM | POA: Diagnosis not present

## 2020-09-15 DIAGNOSIS — L539 Erythematous condition, unspecified: Secondary | ICD-10-CM | POA: Diagnosis not present

## 2020-09-15 DIAGNOSIS — Z86711 Personal history of pulmonary embolism: Secondary | ICD-10-CM | POA: Insufficient documentation

## 2020-09-15 DIAGNOSIS — K760 Fatty (change of) liver, not elsewhere classified: Secondary | ICD-10-CM | POA: Diagnosis not present

## 2020-09-15 DIAGNOSIS — Z7901 Long term (current) use of anticoagulants: Secondary | ICD-10-CM | POA: Insufficient documentation

## 2020-09-15 DIAGNOSIS — X501XXA Overexertion from prolonged static or awkward postures, initial encounter: Secondary | ICD-10-CM | POA: Diagnosis not present

## 2020-09-15 DIAGNOSIS — E119 Type 2 diabetes mellitus without complications: Secondary | ICD-10-CM | POA: Diagnosis not present

## 2020-09-15 DIAGNOSIS — S3992XA Unspecified injury of lower back, initial encounter: Secondary | ICD-10-CM | POA: Diagnosis present

## 2020-09-15 DIAGNOSIS — Z86718 Personal history of other venous thrombosis and embolism: Secondary | ICD-10-CM | POA: Diagnosis not present

## 2020-09-15 DIAGNOSIS — J9811 Atelectasis: Secondary | ICD-10-CM | POA: Diagnosis not present

## 2020-09-15 DIAGNOSIS — Z79899 Other long term (current) drug therapy: Secondary | ICD-10-CM | POA: Insufficient documentation

## 2020-09-15 DIAGNOSIS — Z7984 Long term (current) use of oral hypoglycemic drugs: Secondary | ICD-10-CM | POA: Insufficient documentation

## 2020-09-15 DIAGNOSIS — T148XXA Other injury of unspecified body region, initial encounter: Secondary | ICD-10-CM

## 2020-09-15 DIAGNOSIS — I700 Atherosclerosis of aorta: Secondary | ICD-10-CM | POA: Diagnosis not present

## 2020-09-15 DIAGNOSIS — Z87891 Personal history of nicotine dependence: Secondary | ICD-10-CM | POA: Diagnosis not present

## 2020-09-15 DIAGNOSIS — N281 Cyst of kidney, acquired: Secondary | ICD-10-CM | POA: Diagnosis not present

## 2020-09-15 DIAGNOSIS — M549 Dorsalgia, unspecified: Secondary | ICD-10-CM

## 2020-09-15 DIAGNOSIS — Z955 Presence of coronary angioplasty implant and graft: Secondary | ICD-10-CM | POA: Insufficient documentation

## 2020-09-15 LAB — URINALYSIS, ROUTINE W REFLEX MICROSCOPIC
Bacteria, UA: NONE SEEN
Bilirubin Urine: NEGATIVE
Glucose, UA: >=500 mg/dL — AB
Hgb urine dipstick: NEGATIVE
Ketones, ur: NEGATIVE mg/dL
Leukocytes,Ua: NEGATIVE
Nitrite: NEGATIVE
Protein, ur: NEGATIVE mg/dL
Specific Gravity, Urine: 1.029 (ref 1.005–1.030)
pH: 5 (ref 5.0–8.0)

## 2020-09-15 LAB — CBC WITH DIFFERENTIAL/PLATELET
Abs Immature Granulocytes: 0.12 10*3/uL — ABNORMAL HIGH (ref 0.00–0.07)
Basophils Absolute: 0.1 10*3/uL (ref 0.0–0.1)
Basophils Relative: 1 %
Eosinophils Absolute: 0.2 10*3/uL (ref 0.0–0.5)
Eosinophils Relative: 2 %
HCT: 47.1 % (ref 39.0–52.0)
Hemoglobin: 15.4 g/dL (ref 13.0–17.0)
Immature Granulocytes: 1 %
Lymphocytes Relative: 30 %
Lymphs Abs: 2.7 10*3/uL (ref 0.7–4.0)
MCH: 30.7 pg (ref 26.0–34.0)
MCHC: 32.7 g/dL (ref 30.0–36.0)
MCV: 94 fL (ref 80.0–100.0)
Monocytes Absolute: 0.8 10*3/uL (ref 0.1–1.0)
Monocytes Relative: 9 %
Neutro Abs: 5.1 10*3/uL (ref 1.7–7.7)
Neutrophils Relative %: 57 %
Platelets: 382 10*3/uL (ref 150–400)
RBC: 5.01 MIL/uL (ref 4.22–5.81)
RDW: 13.2 % (ref 11.5–15.5)
WBC: 9 10*3/uL (ref 4.0–10.5)
nRBC: 0 % (ref 0.0–0.2)

## 2020-09-15 MED ORDER — ONDANSETRON HCL 4 MG/2ML IJ SOLN
4.0000 mg | Freq: Once | INTRAMUSCULAR | Status: AC
  Administered 2020-09-15: 4 mg via INTRAVENOUS
  Filled 2020-09-15: qty 2

## 2020-09-15 MED ORDER — MORPHINE SULFATE (PF) 4 MG/ML IV SOLN
4.0000 mg | Freq: Once | INTRAVENOUS | Status: AC
Start: 2020-09-15 — End: 2020-09-15
  Administered 2020-09-15: 4 mg via INTRAVENOUS
  Filled 2020-09-15: qty 1

## 2020-09-15 NOTE — ED Provider Notes (Signed)
[facility] Provider Note   CSN: [id number] Arrival date & time: 09/15/20  1135     History Chief Complaint  Patient presents with  . Bleeding/Bruising    Xavier [name]. is a 55 y.o. male.  The history is provided by the patient, medical records and the spouse. No language interpreter was used.  Back Pain Location:  Thoracic spine and lumbar spine Quality:  Aching Radiates to:  Does not radiate Pain severity:  Severe Pain is:  Unable to specify Onset quality:  Gradual Duration:  4 days Timing:  Constant Progression:  Worsening Chronicity:  New Context: not recent injury and not twisting   Relieved by:  Nothing Worsened by:  Twisting, movement and standing Ineffective treatments:  None tried Associated symptoms: no abdominal pain, no abdominal swelling, no bladder incontinence, no bowel incontinence, no chest pain, no dysuria, no fever, no headaches, no leg pain, no numbness, no paresthesias, no tingling and no weakness        Past Medical History:  Diagnosis Date  . ANEMIA, PERNICIOUS 04/03/2007  . Anxiety and depression   . Arthritis    "left hand" (06/26/2016)  . Chronic lower back pain   . Colon cancer screening 10/27/2016   Cologuard NEG-->rpt 3 yrs  . CORONARY ARTERY DISEASE 12/05/2006  . DIABETES MELLITUS, TYPE II dx'd 2001  . DVT (deep venous thrombosis) ([place]) 2016   BLE  . GERD (gastroesophageal reflux disease)   . History of balanitis    x 2. Holding off on circ as of 05/2020 urol f/u  . History of blood transfusion 2001   "when I had my hand OR"  . History of gout   . History of kidney stones   . HYPERCHOLESTEROLEMIA 07/17/2007  . HYPERTENSION 12/05/2006  . Learning disability   . Migraine    "1-2/month" (06/26/2016)  . Myocardial infarction ([place]) ~ 2003  . Obesity, Class II, BMI 35-39.9   . OSA on CPAP    Pt cannot recall setting clearly but thinks it is 5 cm h20.    . PE (pulmonary embolism) 2016  .  Stroke [facility]) ~ 2006   denies residual on 06/26/2016  . TIA (transient ischemic attack) ?06/25/2016  . VISUAL ACUITY, DECREASED, RIGHT EYE 02/19/2009    Patient Active Problem List   Diagnosis Date Noted  . Shoulder pain, right 09/19/2018  . Vitamin B 12 deficiency 01/10/2018  . Type 2 diabetes mellitus without complication, without long-term current use of insulin ([place])   . Abnormal dobutamine stress echocardiogram   . Chronic back pain 06/15/2016  . Screening examination for infectious disease 06/15/2016  . Obesity 03/29/2016  . OSA (obstructive sleep apnea) 09/06/2015  . Hypersomnia with sleep apnea 07/08/2015  . RLS (restless legs syndrome) 07/08/2015  . Cramp of both lower extremities 06/23/2015  . Personal history of DVT (deep vein thrombosis) 06/23/2015  . History of snoring 06/23/2015  . Excessive somnolence disorder 06/23/2015  . Acute pulmonary embolism ([place]) 06/23/2015  . Obesity, morbid ([place]) 12/25/2014  . Cellulitis 12/17/2013  . Amputation, arm/hand, unilateral, traumatic, with complication ([place]) 73/41/9379  . Depressive disorder 12/03/2013  . Pulmonary embolism ([place]) 11/26/2013  . Screening for prostate cancer 09/24/2012  . Encounter for long-term (current) use of other medications 09/24/2012  . HYPERCHOLESTEROLEMIA 07/17/2007  . ANEMIA, PERNICIOUS 04/03/2007  . Diabetes ([place]) 12/05/2006  . Essential hypertension 12/05/2006  . Coronary atherosclerosis 12/05/2006    Past Surgical History:  Procedure Laterality Date  .  CARDIAC CATHETERIZATION  1990s; 2012; 06/2016   06/2016 distal LAD DES stent.  EF 55-65%  . CARDIOVASCULAR STRESS TEST  06/2016   Echo stress test->"inconclusive"-->cath was done after this  . CATARACT EXTRACTION W/ INTRAOCULAR LENS IMPLANT Right 2017  . CORONARY STENT INTERVENTION N/A 06/27/2016   Procedure: Coronary Stent Intervention;  Surgeon: Leonie Man, MD;  Location: [facility];  Service: Cardiovascular;  Laterality: N/A;  . EYE  SURGERY Right    Cataract  . INGUINAL HERNIA REPAIR Bilateral 1990s  . INTRAVASCULAR PRESSURE WIRE/FFR STUDY N/A 06/27/2016   Procedure: Intravascular Pressure Wire/FFR Study;  Surgeon: Leonie Man, MD;  Location: [facility];  Service: Cardiovascular;  Laterality: N/A;  . LEFT HEART CATH AND CORONARY ANGIOGRAPHY N/A 06/27/2016   Procedure: Left Heart Cath and Coronary Angiography;  Surgeon: Leonie Man, MD;  Location: [facility];  Service: Cardiovascular;  Laterality: N/A;  . REATTACHMENT HAND Left ~ 2001   w/carpal tunnel release  . UMBILICAL HERNIA REPAIR  1990s   w/IHR       Family History  Problem Relation Age of Onset  . COPD Mother   . Lymphoma Father     Social History   Tobacco Use  . Smoking status: Former Smoker    Packs/day: 0.10    Years: 3.00    Pack years: 0.30    Types: Cigarettes    Start date: 54    Quit date: 05/15/1984    Years since quitting: 36.3  . Smokeless tobacco: Never Used  Vaping Use  . Vaping Use: Never used  Substance Use Topics  . Alcohol use: No  . Drug use: No    Home Medications Prior to Admission medications   Medication Sig Start Date End Date Taking? Authorizing Provider  Accu-Chek Softclix Lancets lancets Use as instructed 09/13/20   [name], MD  acetaminophen (TYLENOL) 500 MG tablet Take 1,000 mg by mouth every 6 (six) hours as needed.    [provider]  Blood Glucose Monitoring Suppl (ACCU-CHEK GUIDE ME) w/Device KIT USE TO CHECK BLOOD SUGAR 1 TIME PER DAY. DX CODE: E11.9 09/13/20   [name], MD  bromocriptine (PARLODEL) 2.5 MG tablet TAKE 1 TABLET (2.5 MG TOTAL) BY MOUTH DAILY. TAKE 1/4 TABLET BY MOUTH EVERY DAY 08/22/20   [name], MD  clopidogrel (PLAVIX) 75 MG tablet Take 1 tablet (75 mg total) by mouth daily. 08/18/20   Josue Hector, MD  Continuous Blood Gluc Receiver (FREESTYLE LIBRE 2 READER) DEVI Check sugar once a day. E11.9 09/06/20   [name], MD  Continuous Blood Gluc  Sensor (FREESTYLE LIBRE 2 SENSOR) MISC 1 Device by Does not apply route every 14 (fourteen) days. E11.9 09/09/20   [name], MD  Cyanocobalamin (VITAMIN B12 PO) Take by mouth daily.    [provider]  cyclobenzaprine (FLEXERIL) 10 MG tablet Take 1 tablet (10 mg total) by mouth 3 (three) times daily as needed for muscle spasms. 09/02/20   Montine Circle, PA-C  diazepam (VALIUM) 10 MG tablet Take 10 mg by mouth 3 (three) times daily. 05/23/16   [provider]  DULoxetine (CYMBALTA) 30 MG capsule Take 30 mg by mouth daily.    [provider]  EPINEPHrine 0.3 mg/0.3 mL IJ SOAJ injection Inject 0.3 mLs (0.3 mg total) into the muscle as needed for anaphylaxis. 01/20/20   McGowen, Adrian Blackwater, MD  fenofibrate (TRICOR) 145 MG tablet TAKE 1 TABLET BY MOUTH EVERY DAY  05/13/20   McGowen, Adrian Blackwater, MD  fluconazole (DIFLUCAN) 150 MG tablet TAKE 1 TAB BY MOUTH EVERY 7 DAYS 08/19/20   McGowen, Adrian Blackwater, MD  FLUoxetine (PROZAC) 40 MG capsule Take 40 mg by mouth daily.    [provider]  fluticasone (FLONASE) 50 MCG/ACT nasal spray USE ONE SPRAY IN EACH NOSTRIL TWICE A DAY 01/30/18   [name], MD  glucose blood (ACCU-CHEK GUIDE) test strip Use as instructed 09/13/20   [name], MD  hydrochlorothiazide (HYDRODIURIL) 12.5 MG tablet TAKE 1 TABLET BY MOUTH EVERY DAY WITH THE LOSARTAN 05/13/20   McGowen, Adrian Blackwater, MD  JARDIANCE 25 MG TABS tablet TAKE 1 TABLET BY MOUTH EVERY DAY 08/17/20   [name], MD  lidocaine (LIDODERM) 5 % Place 1 patch onto the skin daily. Remove & Discard patch within 12 hours or as directed by MD 09/02/20   Montine Circle, PA-C  losartan (COZAAR) 100 MG tablet TAKE 1/2 TABLET BY MOUTH EVERY DAY 08/19/20   McGowen, Adrian Blackwater, MD  metFORMIN (GLUCOPHAGE-XR) 500 MG 24 hr tablet TAKE 2 TABLETS BY MOUTH TWICE A DAY 08/17/20   [name], MD  nitroGLYCERIN (NITROSTAT) 0.4 MG SL tablet Place 1 tablet (0.4 mg total) under the tongue every 5 (five) minutes as  needed for chest pain. 01/20/20   McGowen, Adrian Blackwater, MD  nystatin cream (MYCOSTATIN) Apply 1 application topically 2 (two) times daily. 04/13/20   McGowen, Adrian Blackwater, MD  ondansetron (ZOFRAN ODT) 4 MG disintegrating tablet Take 1 tablet (4 mg total) by mouth every 8 (eight) hours as needed for nausea or vomiting. 09/13/20   Lamptey, Myrene Galas, MD  ONETOUCH VERIO test strip USE TO CHECK BLOOD SUGAR 1 TIME PER DAY. DX CODE: E11.9 06/21/17   [name], MD  OZEMPIC, 1 MG/DOSE, 2 MG/1.5ML SOPN INJECT 1 MG INTO THE SKIN ONCE A WEEK. 12/14/19   [name], MD  predniSONE (DELTASONE) 20 MG tablet Take 2 tablets (40 mg total) by mouth daily. Take 40 mg by mouth daily for 3 days, then $RemoveBe'20mg'TxiRTIVzl$  by mouth daily for 3 days, then $RemoveBe'10mg'nwglPbbnl$  daily for 3 days 09/02/20   Montine Circle, PA-C  rosuvastatin (CRESTOR) 5 MG tablet TAKE 1 TABLET BY MOUTH EVERY DAY 05/13/20   McGowen, Adrian Blackwater, MD  traMADol (ULTRAM) 50 MG tablet Take 1-2 tabs at bedtime prn pain 10/14/18   Thurman Coyer, DO  XARELTO 20 MG TABS tablet TAKE 1 TABLET BY MOUTH EVERY DAY BEFORE SUPPER 08/17/20   Martyn Ehrich, NP    Allergies    Actos [pioglitazone], Bee venom, Klonopin [clonazepam], Invokana [canagliflozin], and Niacin  Review of Systems   Review of Systems  Constitutional: Negative for chills, diaphoresis, fatigue and fever.  HENT: Negative for congestion.   Eyes: Negative for visual disturbance.  Respiratory: Negative for cough, chest tightness and shortness of breath.   Cardiovascular: Negative for chest pain.  Gastrointestinal: Negative for abdominal pain, bowel incontinence, constipation, diarrhea, nausea and vomiting.  Genitourinary: Negative for bladder incontinence, dysuria, flank pain and frequency.  Musculoskeletal: Positive for back pain. Negative for neck pain and neck stiffness.  Skin: Positive for color change. Negative for wound.  Neurological: Negative for tingling, weakness, light-headedness, numbness, headaches and  paresthesias.  Psychiatric/Behavioral: Negative for agitation and confusion.  All other systems reviewed and are negative.   Physical Exam Updated Vital Signs BP (!) 140/107 (BP Location: Right Arm)   Pulse 74   Temp 97.9 F (36.6 C) (Oral)  Resp (!) 22   SpO2 100%   Physical Exam Vitals and nursing note reviewed.  Constitutional:      General: He is not in acute distress.    Appearance: He is well-developed. He is not ill-appearing, toxic-appearing or diaphoretic.  HENT:     Head: Normocephalic and atraumatic.  Eyes:     Conjunctiva/sclera: Conjunctivae normal.  Cardiovascular:     Rate and Rhythm: Normal rate and regular rhythm.     Heart sounds: No murmur heard.   Pulmonary:     Effort: Pulmonary effort is normal. No respiratory distress.     Breath sounds: Normal breath sounds. No wheezing, rhonchi or rales.  Chest:     Chest wall: No tenderness.  Abdominal:     General: Abdomen is flat.     Palpations: Abdomen is soft.     Tenderness: There is no abdominal tenderness. There is no right CVA tenderness, left CVA tenderness, guarding or rebound.  Musculoskeletal:        General: Tenderness present.     Cervical back: Neck supple. No tenderness.     Thoracic back: Tenderness present. No signs of trauma.     Lumbar back: Tenderness present. No signs of trauma.       Back:     Comments: See photo   Skin:    General: Skin is warm and dry.     Capillary Refill: Capillary refill takes less than 2 seconds.     Findings: Bruising and erythema present.  Neurological:     General: No focal deficit present.     Mental Status: He is alert.  Psychiatric:        Mood and Affect: Mood normal.         ED Results / Procedures / Treatments   Labs (all labs ordered are listed, but only abnormal results are displayed) Labs Reviewed  CBC WITH DIFFERENTIAL/PLATELET - Abnormal; Notable for the following components:      Result Value   Abs Immature Granulocytes 0.12 (*)     All other components within normal limits  URINALYSIS, ROUTINE W REFLEX MICROSCOPIC - Abnormal; Notable for the following components:   APPearance HAZY (*)    Glucose, UA >=500 (*)    All other components within normal limits  COMPREHENSIVE METABOLIC PANEL    EKG None  Radiology No results found.  Procedures Procedures   Medications Ordered in ED Medications  morphine 4 MG/ML injection 4 mg (4 mg Intravenous Given 09/15/20 2311)  ondansetron (ZOFRAN) injection 4 mg (4 mg Intravenous Given 09/15/20 2310)    ED Course  I have reviewed the triage vital signs and the nursing notes.  Pertinent labs & imaging results that were available during my care of the patient were reviewed by me and considered in my medical decision making (see chart for details).    MDM Rules/Calculators/A&P                          Enoch Moffa. is a 55 y.o. male with a past medical history significant for DVT and PE on Xarelto, prior stroke on Plavix, hypertension, diabetes, hypercholesterolemia, chronic back pain, restless legs, and kidney stones who presents with worsening bruising and pain in his mid back.  Patient reports that since Saturday, 4 days ago, patient has had an area of redness and bruising in his mid back.  He denies any trauma.  He reports he has had worsening pain  in his mid back that does not seem to be radiating.  He is concerned it could be a growing hematoma in his back due to the discomfort.  He went to a PCP several days ago who did work-up    including some basic labs with a CBC CMP as well as ESR and CRP in case it is a vasculitis.  The ESR and CRP were normal and the CBC and CMP were otherwise similar to prior.  They drew a circle around it and then told him to come back if it started to worsen.  Family showed me some pictures that the bruising has extended past the line that they drew and it is warm and red and they are concerned about it.  He otherwise has not had new numbness,  tingling, weakness of legs, bowel or bladder incontinence, or any new trauma.  He is denying fevers, chills, chest pain, shortness of breath, nausea, vomiting, or other complaints.  On exam, patient does indeed have an area of splotchiness, bruising, and warmth and redness in his mid back.  It is tender to palpation but did not appreciate crepitance.  There is some tenderness surrounding the encircled area but he had clear breath sounds.  Rest of back nontender.  Exam otherwise unremarkable with nontender abdomen or hips.  Chest nontender.  Clear breath sounds.  Patient does have a knot in the xiphoid area which she reports she is following with CT surgery for which she suspects is due to injury to bones during previous CPR.  He had normal sensation and strength in lower extremities and denies any leg symptoms.  Clinically I am concerned the patient either has a cellulitis versus some subcutaneous left retroperitoneal hematoma expanding on his back.  Due to his worsening discomfort and the appearance of it, we will get a CT scan of the area to look for ongoing hematoma, cellulitis, or other acute abnormality.  We will get screening labs and also check a urine given the proximity to CVA areas.  If work-up is reassuring, anticipate discharge on antibiotics for possible mild cellulitis.  Care transferred to oncoming team while waiting for results of labs, imaging, and reassessment.   Final Clinical Impression(s) / ED Diagnoses Final diagnoses:  Acute back pain, unspecified back location, unspecified back pain laterality  Bruising  Erythema    Clinical Impression: 1. Acute back pain, unspecified back location, unspecified back pain laterality   2. Bruising   3. Erythema     Disposition: Care transferred to oncoming team while waiting for results of labs, imaging, and reassessment.   This note was prepared with assistance of Systems analyst. Occasional wrong-word or  sound-a-like substitutions may have occurred due to the inherent limitations of voice recognition software.      Richey Doolittle, Gwenyth Allegra, MD 09/16/20 [phone]

## 2020-09-15 NOTE — ED Provider Notes (Incomplete)
Plan is obtain CT chest abdomen pelvis.  No signs of any retroperitoneal hematoma, patient can be discharged on oral doxycycline

## 2020-09-15 NOTE — ED Triage Notes (Signed)
Pt states he noticed bruising on his midback. Denies any injury. Pt states he was seen at urgent care- the area was circled with a marker. Pt states the bruising has gone outside the circle now. Denies any pain at the site.

## 2020-09-15 NOTE — ED Provider Notes (Signed)
Plan is obtain CT chest abdomen pelvis.  No signs of any retroperitoneal hematoma, patient can be discharged on oral doxycycline   Zadie Rhine, MD 09/16/20 [phone]

## 2020-09-16 ENCOUNTER — Emergency Department (HOSPITAL_COMMUNITY): Payer: Medicare Other

## 2020-09-16 DIAGNOSIS — J9811 Atelectasis: Secondary | ICD-10-CM | POA: Diagnosis not present

## 2020-09-16 DIAGNOSIS — K760 Fatty (change of) liver, not elsewhere classified: Secondary | ICD-10-CM | POA: Diagnosis not present

## 2020-09-16 DIAGNOSIS — Z87828 Personal history of other (healed) physical injury and trauma: Secondary | ICD-10-CM | POA: Diagnosis not present

## 2020-09-16 DIAGNOSIS — N281 Cyst of kidney, acquired: Secondary | ICD-10-CM | POA: Diagnosis not present

## 2020-09-16 DIAGNOSIS — I700 Atherosclerosis of aorta: Secondary | ICD-10-CM | POA: Diagnosis not present

## 2020-09-16 DIAGNOSIS — I2510 Atherosclerotic heart disease of native coronary artery without angina pectoris: Secondary | ICD-10-CM | POA: Diagnosis not present

## 2020-09-16 LAB — COMPREHENSIVE METABOLIC PANEL
ALT: 23 U/L (ref 0–44)
AST: 17 U/L (ref 15–41)
Albumin: 4 g/dL (ref 3.5–5.0)
Alkaline Phosphatase: 39 U/L (ref 38–126)
Anion gap: 7 (ref 5–15)
BUN: 19 mg/dL (ref 6–20)
CO2: 28 mmol/L (ref 22–32)
Calcium: 9.6 mg/dL (ref 8.9–10.3)
Chloride: 98 mmol/L (ref 98–111)
Creatinine, Ser: 1.3 mg/dL — ABNORMAL HIGH (ref 0.61–1.24)
GFR, Estimated: >60 mL/min (ref >60)
Glucose, Bld: 163 mg/dL — ABNORMAL HIGH (ref 70–99)
Potassium: 3.5 mmol/L (ref 3.5–5.1)
Sodium: 133 mmol/L — ABNORMAL LOW (ref 135–145)
Total Bilirubin: 0.6 mg/dL (ref 0.3–1.2)
Total Protein: 7.1 g/dL (ref 6.5–8.1)

## 2020-09-16 MED ORDER — DOXYCYCLINE HYCLATE 100 MG PO CAPS: 100.0000 mg | ORAL_CAPSULE | Freq: Two times a day (BID) | ORAL | 0 refills | Status: DC

## 2020-09-16 MED ORDER — HYDROCODONE-ACETAMINOPHEN 5-325 MG PO TABS: 1.0000 | ORAL_TABLET | Freq: Four times a day (QID) | ORAL | 0 refills | Status: DC | PRN

## 2020-09-16 MED ORDER — HYDROCODONE-ACETAMINOPHEN 5-325 MG PO TABS
1.0000 | ORAL_TABLET | Freq: Once | ORAL | Status: AC
  Administered 2020-09-16: 1 via ORAL
  Filled 2020-09-16: qty 1

## 2020-09-16 MED ORDER — IOHEXOL 300 MG/ML  SOLN
75.0000 mL | Freq: Once | INTRAMUSCULAR | Status: AC | PRN
  Administered 2020-09-16: 75 mL via INTRAVENOUS

## 2020-09-16 NOTE — ED Provider Notes (Signed)
CT imaging is negative.  Patient is in no acute distress.  He is unsure what caused the bruising or rash.  Denies tick bite.  There is no rash to his palms. He request pain medications.  We will also place on doxycycline twice daily for 10 days.  Will refer back to PCP and dermatology.   Zadie Rhine, MD 09/16/20 [phone]

## 2020-09-16 NOTE — ED Notes (Signed)
Patient transported to CT 

## 2020-09-20 DIAGNOSIS — M48062 Spinal stenosis, lumbar region with neurogenic claudication: Secondary | ICD-10-CM | POA: Diagnosis not present

## 2020-09-20 DIAGNOSIS — M5459 Other low back pain: Secondary | ICD-10-CM | POA: Diagnosis not present

## 2020-09-26 ENCOUNTER — Other Ambulatory Visit: Payer: Self-pay

## 2020-09-26 DIAGNOSIS — E119 Type 2 diabetes mellitus without complications: Secondary | ICD-10-CM

## 2020-09-26 MED ORDER — ACCU-CHEK GUIDE VI STRP: ORAL_STRIP | 12 refills | Status: DC

## 2020-09-29 ENCOUNTER — Encounter: Payer: Self-pay | Admitting: Surgery

## 2020-09-29 ENCOUNTER — Institutional Professional Consult (permissible substitution) (INDEPENDENT_AMBULATORY_CARE_PROVIDER_SITE_OTHER): Payer: Medicare Other | Admitting: Surgery

## 2020-09-29 ENCOUNTER — Other Ambulatory Visit: Payer: Self-pay

## 2020-09-29 VITALS — BP 112/80 | HR 76 | Resp 20 | Ht 71.0 in | Wt 252.0 lb

## 2020-09-29 DIAGNOSIS — M954 Acquired deformity of chest and rib: Secondary | ICD-10-CM

## 2020-09-29 DIAGNOSIS — I2510 Atherosclerotic heart disease of native coronary artery without angina pectoris: Secondary | ICD-10-CM | POA: Diagnosis not present

## 2020-09-29 DIAGNOSIS — M545 Low back pain, unspecified: Secondary | ICD-10-CM | POA: Diagnosis not present

## 2020-09-29 NOTE — Progress Notes (Signed)
Cardiothoracic Surgery Consultation  PCP is McGowen, Adrian Blackwater, MD Referring Provider is Josue Hector, MD  Chief Complaint  Patient presents with  . Sternal Deformity     Initial surgical consult, CT chest 4/29    HPI:  The patient is a 55 year old gentleman with a history of diabetes, hypertension, hyperlipidemia, DVT on chronic Xarelto, coronary disease status post PCI with DES to the LAD in February 2018, prior stroke, and spine disease with recently worsening numbness and weakness in the left upper and lower extremity.  He was referred for evaluation of a painful prominence of the lower sternum.  He says that this is been present for years but causing more discomfort and becoming more noticeable lately.  It particularly bothers him at night when lying on his side.  The patient is here today with his wife.  He said that he is scheduled for an appointment with orthopedic surgery later today concerning his progressive weakness and numbness in the left upper and lower extremity. Past Medical History:  Diagnosis Date  . ANEMIA, PERNICIOUS 04/03/2007  . Anxiety and depression   . Arthritis    "left hand" (06/26/2016)  . Chronic lower back pain   . Colon cancer screening 10/27/2016   Cologuard NEG-->rpt 3 yrs  . CORONARY ARTERY DISEASE 12/05/2006  . DIABETES MELLITUS, TYPE II dx'd 2001  . DVT (deep venous thrombosis) ([place]) 2016   BLE  . GERD (gastroesophageal reflux disease)   . History of balanitis    x 2. Holding off on circ as of 05/2020 urol f/u  . History of blood transfusion 2001   "when I had my hand OR"  . History of gout   . History of kidney stones   . HYPERCHOLESTEROLEMIA 07/17/2007  . HYPERTENSION 12/05/2006  . Learning disability   . Migraine    "1-2/month" (06/26/2016)  . Myocardial infarction ([place]) ~ 2003  . Obesity, Class II, BMI 35-39.9   . OSA on CPAP    Pt cannot recall setting clearly but thinks it is 5 cm h20.    . PE (pulmonary embolism) 2016  . Stroke  [facility]) ~ 2006   denies residual on 06/26/2016  . TIA (transient ischemic attack) ?06/25/2016  . VISUAL ACUITY, DECREASED, RIGHT EYE 02/19/2009    Past Surgical History:  Procedure Laterality Date  . CARDIAC CATHETERIZATION  1990s; 2012; 06/2016   06/2016 distal LAD DES stent.  EF 55-65%  . CARDIOVASCULAR STRESS TEST  06/2016   Echo stress test->"inconclusive"-->cath was done after this  . CATARACT EXTRACTION W/ INTRAOCULAR LENS IMPLANT Right 2017  . CORONARY STENT INTERVENTION N/A 06/27/2016   Procedure: Coronary Stent Intervention;  Surgeon: Leonie Man, MD;  Location: [place] [facility];  Service: Cardiovascular;  Laterality: N/A;  . EYE SURGERY Right    Cataract  . INGUINAL HERNIA REPAIR Bilateral 1990s  . INTRAVASCULAR PRESSURE WIRE/FFR STUDY N/A 06/27/2016   Procedure: Intravascular Pressure Wire/FFR Study;  Surgeon: Leonie Man, MD;  Location: [place] [facility];  Service: Cardiovascular;  Laterality: N/A;  . LEFT HEART CATH AND CORONARY ANGIOGRAPHY N/A 06/27/2016   Procedure: Left Heart Cath and Coronary Angiography;  Surgeon: Leonie Man, MD;  Location: [place] [facility];  Service: Cardiovascular;  Laterality: N/A;  . REATTACHMENT HAND Left ~ 2001   w/carpal tunnel release  . UMBILICAL HERNIA REPAIR  1990s   w/IHR    Family History  Problem Relation Age of Onset  . COPD Mother   .  Lymphoma Father     Social History Social History   Tobacco Use  . Smoking status: Former Smoker    Packs/day: 0.10    Years: 3.00    Pack years: 0.30    Types: Cigarettes    Start date: 31    Quit date: 05/15/1984    Years since quitting: 36.4  . Smokeless tobacco: Never Used  Vaping Use  . Vaping Use: Never used  Substance Use Topics  . Alcohol use: No  . Drug use: No    Current Outpatient Medications  Medication Sig Dispense Refill  . Accu-Chek Softclix Lancets lancets Use as instructed 100 each 12  . acetaminophen (TYLENOL) 500 MG tablet Take 1,000 mg by mouth every 6  (six) hours as needed.    . Blood Glucose Monitoring Suppl (ACCU-CHEK GUIDE ME) w/Device KIT USE TO CHECK BLOOD SUGAR 1 TIME PER DAY. DX CODE: E11.9 1 kit 0  . bromocriptine (PARLODEL) 2.5 MG tablet TAKE 1 TABLET (2.5 MG TOTAL) BY MOUTH DAILY. TAKE 1/4 TABLET BY MOUTH EVERY DAY 45 tablet 1  . clopidogrel (PLAVIX) 75 MG tablet Take 1 tablet (75 mg total) by mouth daily. 90 tablet 0  . Continuous Blood Gluc Receiver (FREESTYLE LIBRE 2 READER) DEVI Check sugar once a day. E11.9 1 each 0  . Continuous Blood Gluc Sensor (FREESTYLE LIBRE 2 SENSOR) MISC 1 Device by Does not apply route every 14 (fourteen) days. E11.9 6 each 3  . Cyanocobalamin (VITAMIN B12 PO) Take by mouth daily.    . cyclobenzaprine (FLEXERIL) 10 MG tablet Take 1 tablet (10 mg total) by mouth 3 (three) times daily as needed for muscle spasms. 15 tablet 0  . diazepam (VALIUM) 10 MG tablet Take 10 mg by mouth 3 (three) times daily.    Marland Kitchen doxycycline (VIBRAMYCIN) 100 MG capsule Take 1 capsule (100 mg total) by mouth 2 (two) times daily. 20 capsule 0  . DULoxetine (CYMBALTA) 30 MG capsule Take 30 mg by mouth daily.    Marland Kitchen EPINEPHrine 0.3 mg/0.3 mL IJ SOAJ injection Inject 0.3 mLs (0.3 mg total) into the muscle as needed for anaphylaxis. 2 each 1  . fenofibrate (TRICOR) 145 MG tablet TAKE 1 TABLET BY MOUTH EVERY DAY 90 tablet 1  . fluconazole (DIFLUCAN) 150 MG tablet TAKE 1 TAB BY MOUTH EVERY 7 DAYS 4 tablet 1  . FLUoxetine (PROZAC) 40 MG capsule Take 40 mg by mouth daily.    . fluticasone (FLONASE) 50 MCG/ACT nasal spray USE ONE SPRAY IN EACH NOSTRIL TWICE A DAY 16 g 2  . glucose blood (ACCU-CHEK GUIDE) test strip check your blood sugar once a day 100 each 12  . hydrochlorothiazide (HYDRODIURIL) 12.5 MG tablet TAKE 1 TABLET BY MOUTH EVERY DAY WITH THE LOSARTAN 90 tablet 1  . HYDROcodone-acetaminophen (NORCO/VICODIN) 5-325 MG tablet Take 1 tablet by mouth every 6 (six) hours as needed for severe pain. 5 tablet 0  . JARDIANCE 25 MG TABS tablet  TAKE 1 TABLET BY MOUTH EVERY DAY 90 tablet 1  . lidocaine (LIDODERM) 5 % Place 1 patch onto the skin daily. Remove & Discard patch within 12 hours or as directed by MD 15 patch 0  . losartan (COZAAR) 100 MG tablet TAKE 1/2 TABLET BY MOUTH EVERY DAY 45 tablet 1  . metFORMIN (GLUCOPHAGE-XR) 500 MG 24 hr tablet TAKE 2 TABLETS BY MOUTH TWICE A DAY 360 tablet 0  . nitroGLYCERIN (NITROSTAT) 0.4 MG SL tablet Place 1 tablet (0.4 mg total) under the  tongue every 5 (five) minutes as needed for chest pain. 10 tablet 0  . nystatin cream (MYCOSTATIN) Apply 1 application topically 2 (two) times daily. 30 g 1  . ondansetron (ZOFRAN ODT) 4 MG disintegrating tablet Take 1 tablet (4 mg total) by mouth every 8 (eight) hours as needed for nausea or vomiting. 20 tablet 0  . OZEMPIC, 1 MG/DOSE, 2 MG/1.5ML SOPN INJECT 1 MG INTO THE SKIN ONCE A WEEK. 6 pen 3  . predniSONE (DELTASONE) 20 MG tablet Take 2 tablets (40 mg total) by mouth daily. Take 40 mg by mouth daily for 3 days, then 65m by mouth daily for 3 days, then 160mdaily for 3 days 12 tablet 0  . rosuvastatin (CRESTOR) 5 MG tablet TAKE 1 TABLET BY MOUTH EVERY DAY 90 tablet 1  . traMADol (ULTRAM) 50 MG tablet Take 1-2 tabs at bedtime prn pain 14 tablet 1  . XARELTO 20 MG TABS tablet TAKE 1 TABLET BY MOUTH EVERY DAY BEFORE SUPPER 30 tablet 5   No current facility-administered medications for this visit.    Allergies  Allergen Reactions  . Actos [Pioglitazone] Other (See Comments)    Potential cause of DVT  . Bee Venom Anaphylaxis  . Klonopin [Clonazepam] Anaphylaxis  . Invokana [Canagliflozin] Nausea Only    Nausea/dizzy/bad taste in mouth  . Niacin Other (See Comments)    "makes his crazy"    Review of Systems  Constitutional: Positive for activity change.  Cardiovascular: Negative for chest pain and leg swelling.  Gastrointestinal: Negative.   Musculoskeletal: Positive for back pain.  Skin: Positive for rash.       On back  Neurological: Positive  for weakness and numbness.       Left upper and lower extremity    BP 112/80 (BP Location: Left Arm, Patient Position: Sitting)   Pulse 76   Resp 20   Ht 5' 11"  (1.803 m)   Wt 252 lb (114.3 kg)   SpO2 96% Comment: RA  BMI 35.15 kg/m  Physical Exam Constitutional:      Appearance: Normal appearance. He is obese.  HENT:     Head: Normocephalic and atraumatic.  Eyes:     Extraocular Movements: Extraocular movements intact.     Pupils: Pupils are equal, round, and reactive to light.  Cardiovascular:     Rate and Rhythm: Normal rate and regular rhythm.     Heart sounds: Normal heart sounds. No murmur heard.   Pulmonary:     Effort: Pulmonary effort is normal.     Breath sounds: Normal breath sounds.  Abdominal:     Comments: Protrusion of xiphoid process anteriorly in the epigastrium.  Skin:    General: Skin is warm and dry.     Comments: Blotchy skin discoloration in the mid back area  Neurological:     Mental Status: He is alert.      Diagnostic Tests:  Narrative & Impression  CLINICAL DATA:  Mid back bruising and redness.  EXAM: CT CHEST, ABDOMEN, AND PELVIS WITH CONTRAST  TECHNIQUE: Multidetector CT imaging of the chest, abdomen and pelvis was performed following the standard protocol during bolus administration of intravenous contrast.  CONTRAST:  7543mMNIPAQUE IOHEXOL 300 MG/ML  SOLN  COMPARISON:  September 10, 2020  FINDINGS: CT CHEST FINDINGS  Cardiovascular: There is mild calcification of the aortic arch without evidence of aortic aneurysm. Normal heart size. No pericardial effusion.  Mediastinum/Nodes: No enlarged mediastinal, hilar, or axillary lymph nodes. Thyroid gland, trachea, and  esophagus demonstrate no significant findings.  Lungs/Pleura: Mild atelectasis is seen anterior aspect of the right upper lobe and posterior aspects of the bilateral lung bases.  There is no evidence of a pleural effusion or  pneumothorax.  Musculoskeletal: No chest wall mass or suspicious bone lesions identified.  CT ABDOMEN PELVIS FINDINGS  Hepatobiliary: There is diffuse fatty infiltration of the liver parenchyma. No focal liver abnormality is seen. No gallstones, gallbladder wall thickening, or biliary dilatation.  Pancreas: Unremarkable. No pancreatic ductal dilatation or surrounding inflammatory changes.  Spleen: Normal in size without focal abnormality.  Adrenals/Urinary Tract: Adrenal glands are unremarkable. Kidneys are normal in size, without renal calculi or hydronephrosis. 5.3 cm x 4.7 cm and 2.3 cm x 2.0 cm simple cysts are seen along the posterior and medial aspects of the mid right kidney. Bladder is unremarkable.  Stomach/Bowel: Stomach is within normal limits. Appendix appears normal. No evidence of bowel wall thickening, distention, or inflammatory changes.  Vascular/Lymphatic: No significant vascular findings are present. No enlarged abdominal or pelvic lymph nodes.  Reproductive: Prostate is unremarkable.  Other: No abdominal wall hernia or abnormality. No abdominopelvic ascites.  Musculoskeletal: No acute or significant osseous findings.  IMPRESSION: 1. No abnormality which corresponds to the area of growing bruising and redness to the patient's mid back. 2. No acute or active cardiopulmonary disease. 3. Simple right renal cysts.   Electronically Signed   By: Virgina Norfolk M.D.   On: 09/16/2020 01:50    CLINICAL DATA:  Chest wall pain, sternal deformity  EXAM: CT CHEST WITHOUT CONTRAST  TECHNIQUE: Multidetector CT imaging of the chest was performed following the standard protocol without IV contrast.  COMPARISON:  CT chest dated 11/25/2013  FINDINGS: Cardiovascular: Heart is normal in size.  No pericardial effusion.  No evidence of thoracic aortic aneurysm. Mild atherosclerotic calcifications of the aortic arch.  Coronary  atherosclerosis of the LAD.  Mediastinum/Nodes: No suspicious mediastinal lymphadenopathy.  10 mm left thyroid nodule (series 2/image 4), benign.  Lungs/Pleura: Lungs are essentially clear, noting minimal dependent atelectasis/scarring in the bilateral lower lobes.  No focal consolidation.  No suspicious pulmonary nodules.  Upper Abdomen: Visualized upper abdomen is notable for mild hepatic steatosis and a 5.2 cm right upper pole renal cyst.  Musculoskeletal: Degenerative changes of the visualized thoracolumbar spine.  Hook-like morphology with anterior curvature of the inferior xiphoid process (sagittal image 114), corresponding to the palpable abnormality. This is unchanged from 2015.  IMPRESSION: Hook-like morphology of the inferior xiphoid process, corresponding to the palpable abnormality, unchanged from 2015.  No evidence of acute cardiopulmonary disease.  Aortic Atherosclerosis (ICD10-I70.0).   Electronically Signed   By: Julian Hy M.D.   On: 09/10/2020 16:46   Impression:  He has obvious prominence of the xiphoid process on exam.  CT scan of the chest shows a hooklike morphology of the inferior portion of the xiphoid process which appears somewhat worse compared to his CT scan of the chest done in 2015.  I think this is the cause of his discomfort in the epigastrium.  I reviewed the CT images with him and his wife and answered their questions.  I think this can be easily resected for relief of his symptoms.  Unfortunately this time he has some other issues going on with worsening numbness and weakness in his left upper and lower extremities possibly related to spine disease.  I think this should be resolved before considering resecting his xiphoid process.  He would need to be off of  Plavix and Xarelto for least 48 hours preoperatively.   Plan:  He will call us if he decides to schedule resection of his xiphoid process under general anesthesia  once the issues with his left upper and lower extremity weakness and numbness have resolved.  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   Gaye Pollack, MD [facility] [phone]

## 2020-10-01 DIAGNOSIS — M5459 Other low back pain: Secondary | ICD-10-CM | POA: Diagnosis not present

## 2020-10-01 DIAGNOSIS — M545 Low back pain, unspecified: Secondary | ICD-10-CM | POA: Diagnosis not present

## 2020-10-08 DIAGNOSIS — M545 Low back pain, unspecified: Secondary | ICD-10-CM | POA: Diagnosis not present

## 2020-10-12 ENCOUNTER — Other Ambulatory Visit: Payer: Self-pay | Admitting: Endocrinology

## 2020-10-12 DIAGNOSIS — E119 Type 2 diabetes mellitus without complications: Secondary | ICD-10-CM

## 2020-10-15 DIAGNOSIS — M5416 Radiculopathy, lumbar region: Secondary | ICD-10-CM | POA: Insufficient documentation

## 2020-10-21 ENCOUNTER — Other Ambulatory Visit: Payer: Self-pay | Admitting: Endocrinology

## 2020-10-21 ENCOUNTER — Other Ambulatory Visit: Payer: Self-pay | Admitting: Family Medicine

## 2020-10-22 ENCOUNTER — Other Ambulatory Visit: Payer: Self-pay | Admitting: Endocrinology

## 2020-10-25 ENCOUNTER — Other Ambulatory Visit: Payer: Self-pay | Admitting: Endocrinology

## 2020-10-25 MED ORDER — BROMOCRIPTINE MESYLATE 2.5 MG PO TABS: 2.5000 mg | ORAL_TABLET | Freq: Every day | ORAL | 1 refills | Status: DC

## 2020-11-02 DIAGNOSIS — M5416 Radiculopathy, lumbar region: Secondary | ICD-10-CM | POA: Diagnosis not present

## 2020-11-02 DIAGNOSIS — M47816 Spondylosis without myelopathy or radiculopathy, lumbar region: Secondary | ICD-10-CM | POA: Diagnosis not present

## 2020-11-09 ENCOUNTER — Other Ambulatory Visit: Payer: Self-pay | Admitting: Cardiovascular Disease

## 2020-11-09 ENCOUNTER — Other Ambulatory Visit: Payer: Self-pay | Admitting: Family Medicine

## 2020-11-09 ENCOUNTER — Other Ambulatory Visit: Payer: Self-pay | Admitting: Endocrinology

## 2020-11-16 DIAGNOSIS — M5416 Radiculopathy, lumbar region: Secondary | ICD-10-CM | POA: Diagnosis not present

## 2020-11-30 DIAGNOSIS — M545 Low back pain, unspecified: Secondary | ICD-10-CM | POA: Diagnosis not present

## 2020-12-02 ENCOUNTER — Telehealth: Payer: Self-pay | Admitting: Family Medicine

## 2020-12-02 ENCOUNTER — Other Ambulatory Visit: Payer: Self-pay | Admitting: Cardiovascular Disease

## 2020-12-02 ENCOUNTER — Telehealth: Payer: Self-pay | Admitting: Cardiovascular Disease

## 2020-12-02 MED ORDER — NITROGLYCERIN 0.4 MG SL SUBL
0.4000 mg | SUBLINGUAL_TABLET | SUBLINGUAL | 3 refills | Status: DC | PRN
Start: 2020-12-02 — End: 2023-02-07

## 2020-12-02 NOTE — Telephone Encounter (Signed)
Refill for Nitroglycerin has been sent to [facility], per pt request.

## 2020-12-02 NOTE — Telephone Encounter (Signed)
*  STAT* If patient is at the pharmacy, call can be transferred to refill team.   1. Which medications need to be refilled? (please list name of each medication and dose if known) Nitroglycerin  2. Which pharmacy/location (including street and city if local pharmacy) [facility]  3. Do they need a 30 day or 90 day supply? Not sure   

## 2020-12-02 NOTE — Telephone Encounter (Signed)
Received faxed form from [facility]. Placed in Dr. Samul Dada front office inbox to sign and return.

## 2020-12-03 NOTE — Telephone Encounter (Signed)
Form placed on providers desk. Please return back to MA's once completed to fax

## 2020-12-03 NOTE — Telephone Encounter (Signed)
Form signed and placed on MAs desk.

## 2020-12-06 ENCOUNTER — Telehealth: Payer: Self-pay | Admitting: Endocrinology

## 2020-12-06 ENCOUNTER — Other Ambulatory Visit: Payer: Self-pay

## 2020-12-06 DIAGNOSIS — M47816 Spondylosis without myelopathy or radiculopathy, lumbar region: Secondary | ICD-10-CM | POA: Diagnosis not present

## 2020-12-06 NOTE — Telephone Encounter (Signed)
please contact patient: I got surgical clearance.  Please move up next appt to next avail.  4:30PM is OK

## 2020-12-06 NOTE — Telephone Encounter (Signed)
See note

## 2020-12-06 NOTE — Telephone Encounter (Signed)
Faxed signed form to [facility]. Fax confirmation received.

## 2020-12-07 ENCOUNTER — Other Ambulatory Visit: Payer: Self-pay

## 2020-12-07 ENCOUNTER — Telehealth: Payer: Self-pay

## 2020-12-07 ENCOUNTER — Encounter: Payer: Self-pay | Admitting: Primary Care

## 2020-12-07 ENCOUNTER — Ambulatory Visit: Payer: Self-pay | Admitting: Orthopedic Surgery

## 2020-12-07 ENCOUNTER — Ambulatory Visit (INDEPENDENT_AMBULATORY_CARE_PROVIDER_SITE_OTHER): Payer: Medicare Other | Admitting: Primary Care

## 2020-12-07 DIAGNOSIS — Z01811 Encounter for preprocedural respiratory examination: Secondary | ICD-10-CM

## 2020-12-07 DIAGNOSIS — I2699 Other pulmonary embolism without acute cor pulmonale: Secondary | ICD-10-CM | POA: Diagnosis not present

## 2020-12-07 DIAGNOSIS — G4733 Obstructive sleep apnea (adult) (pediatric): Secondary | ICD-10-CM

## 2020-12-07 NOTE — Assessment & Plan Note (Addendum)
-   Hx severe OSA. HST 07/19/15 showed severe obstructive sleep apnea, AHI 36.4/hr with SpO2 low 80%. Maintained on auto CPAP 5-15cm h20. Patient is not consistently compliant with CPAP use. He was concerned about philip's recall, I have checked his serial number on their website and determined his current CPAP machine should not have been affected by the recall. He is due for new machine as it has been 5 years since he received this machine. DME order was placed back in March 2022. Advised it is ok for him to resume CPAP use until he gets new machine. Ask him to aim to wear 4-6 hours or longer every night. FU in 6 months or sooner if needed.

## 2020-12-07 NOTE — Telephone Encounter (Signed)
   [facility] Pre-operative Risk Assessment    Patient Name: Xavier [name].  DOB: 10-02-1965 MRN: [id number]  HEARTCARE STAFF:  - IMPORTANT!!!!!! Under Visit Info/Reason for Call, type in Other and utilize the format Clearance MM/DD/YY or Clearance TBD. Do not use dashes or single digits. - Please review there is not already an duplicate clearance open for this procedure. - If request is for dental extraction, please clarify the # of teeth to be extracted. - If the patient is currently at the dentist's office, call Pre-Op Callback Staff (MA/nurse) to input urgent request.  - If the patient is not currently in the dentist office, please route to the Pre-Op pool.  Request for surgical clearance:  What type of surgery is being performed? L4-5 Decompression  When is this surgery scheduled? 01/05/2021  What type of clearance is required (medical clearance vs. Pharmacy clearance to hold med vs. Both)? Both   Are there any medications that need to be held prior to surgery and how long? Plavix and Xarelto for 5 days   Practice name and name of physician performing surgery? Dr Melina Schools      [facility]  What is the office phone number? [phone] Orson Slick)  7.   What is the office fax number? [phone]  8.   Anesthesia type (None, local, MAC, general) ? Claiborne Rigg T 12/07/2020, 3:54 PM  _________________________________________________________________   (provider comments below)

## 2020-12-07 NOTE — Assessment & Plan Note (Addendum)
-   CTA 11/25/13 + subsegmental level; pos fam hx dvt mother/sister - Maintained on lifetime anticoagulation with Xarelto 20mg  daily

## 2020-12-07 NOTE — Progress Notes (Signed)
_0  ID: Xavier [name]., male    DOB: 1965/09/28, 55 y.o.   MRN: [id number]  Chief Complaint  Patient presents with   Follow-up    Patient is here for surgical clearance of  cyst in lower back that needs to be taken out and possible decompression.    Referring provider: Tammi Sou, MD  HPI: 55 year old male, former smoker. PMH significant for HTN, pulmonary embolism, coronary atherosclerosis, OSA, type 2 diabetes, obesity. Patient of Dr. Halford Chessman, last seen by pulmonary NP on 11/07/18. HST 07/19/15 showed severe obstructive sleep apnea, AHI 36.4/hr with SpO2 low 80%. Maintained on CPAP 5-15cm h20.   Previous [facility] pulmonary encounter: 07/20/2020 Patient present today for annual follow-up OSA. He stopped using CPAP machine several months ago d/t philips recall. Prior to that he was tolerating machine well. He gets on average 8 hours of sleep a night. Continues to snore and wakes himself up talking. Energy level is lower. He has lost 20-30 total since his sleep study in 2017. No download available. [facility] 9/24.   12/07/2020 - Interim hx  Patient presents today for surgical risk assessment. He is planning for L4-5 decompression with Dr. Rolena Infante with [facility], date to be determined. There are plans for him to be admitted post-op, likely in the step down.   He is doing well today, no acute complaints. He is accompanied by his wife. No recent respiratory infections in the last month. He has not been consistently wearing his CPAP d/t recall. During our last visit we placed an order for a new machine which he has not yet received. He brought it copy of the serial number and we have determined that his current Philips CPAP machine was likely not effected by the recall. He wears his CPAP intermittently, he sleeps well when he wears it. He understands that he has to use it every night. No [employer] available. He takes Xarelto for hx DVT/PE. Denies shortness of breath, chest tightness or  wheezing.   CPAP machine information:  Serial number- sn [id number] Model - ref [phone] Batch code- lot [id number] Device number 964   Allergies  Allergen Reactions   Actos [Pioglitazone] Other (See Comments)    Potential cause of DVT   Bee Venom Anaphylaxis   Klonopin [Clonazepam] Anaphylaxis   Invokana [Canagliflozin] Nausea Only    Nausea/dizzy/bad taste in mouth   Niacin Other (See Comments)    "makes his crazy"    Immunization History  Administered Date(s) Administered   Influenza,inj,Quad PF,6+ Mos 06/23/2015, 01/10/2018, 01/17/2019, 01/20/2020   PFIZER(Purple Top)SARS-COV-2 Vaccination 12/06/2019, 12/27/2019   Pneumococcal Polysaccharide-23 10/07/2010   Td 12/17/2008   Tdap 01/17/2019, 01/22/2020    Past Medical History:  Diagnosis Date   ANEMIA, PERNICIOUS 04/03/2007   Anxiety and depression    Arthritis    "left hand" (06/26/2016)   Chronic lower back pain    Colon cancer screening 10/27/2016   Cologuard NEG-->rpt 3 yrs   CORONARY ARTERY DISEASE 12/05/2006   DIABETES MELLITUS, TYPE II dx'd 2001   DVT (deep venous thrombosis) ([place]) 2016   BLE   GERD (gastroesophageal reflux disease)    History of balanitis    x 2. Holding off on circ as of 05/2020 urol f/u   History of blood transfusion 2001   "when I had my hand OR"   History of gout    History of kidney stones    HYPERCHOLESTEROLEMIA 07/17/2007   HYPERTENSION 12/05/2006   Learning disability  Migraine    "1-2/month" (06/26/2016)   Myocardial infarction [facility]) ~ 2003   Obesity, Class II, BMI 35-39.9    OSA on CPAP    Pt cannot recall setting clearly but thinks it is 5 cm h20.     PE (pulmonary embolism) 2016   Stroke [facility]) ~ 2006   denies residual on 06/26/2016   TIA (transient ischemic attack) ?06/25/2016   VISUAL ACUITY, DECREASED, RIGHT EYE 02/19/2009    Tobacco History: Social History   Tobacco Use  Smoking Status Former   Packs/day: 0.10   Years: 3.00   Pack years: 0.30   Types: Cigarettes    Start date: 19   Quit date: 05/15/1984   Years since quitting: 36.5  Smokeless Tobacco Never   Counseling given: Not Answered   Outpatient Medications Prior to Visit  Medication Sig Dispense Refill   acetaminophen (TYLENOL) 500 MG tablet Take 1,000 mg by mouth every 6 (six) hours as needed.     Blood Glucose Monitoring Suppl (ACCU-CHEK GUIDE ME) w/Device KIT USE TO CHECK BLOOD SUGAR 1 TIME PER DAY. DX CODE: E11.9 1 kit 0   bromocriptine (PARLODEL) 2.5 MG tablet Take 1 tablet (2.5 mg total) by mouth daily. 45 tablet 1   clopidogrel (PLAVIX) 75 MG tablet TAKE 1 TABLET BY MOUTH EVERY DAY 90 tablet 3   Cyanocobalamin (VITAMIN B12 PO) Take by mouth daily.     cyclobenzaprine (FLEXERIL) 10 MG tablet Take 1 tablet (10 mg total) by mouth 3 (three) times daily as needed for muscle spasms. 15 tablet 0   diazepam (VALIUM) 10 MG tablet Take 10 mg by mouth 3 (three) times daily.     DULoxetine (CYMBALTA) 30 MG capsule Take 30 mg by mouth daily.     EPINEPHrine 0.3 mg/0.3 mL IJ SOAJ injection Inject 0.3 mLs (0.3 mg total) into the muscle as needed for anaphylaxis. 2 each 1   fenofibrate (TRICOR) 145 MG tablet TAKE 1 TABLET BY MOUTH EVERY DAY 90 tablet 1   fluconazole (DIFLUCAN) 150 MG tablet TAKE 1 TAB BY MOUTH EVERY 7 DAYS 4 tablet 1   FLUoxetine (PROZAC) 40 MG capsule Take 40 mg by mouth daily.     fluticasone (FLONASE) 50 MCG/ACT nasal spray USE ONE SPRAY IN EACH NOSTRIL TWICE A DAY 16 g 2   glucose blood (ACCU-CHEK GUIDE) test strip check your blood sugar once a day 100 each 12   hydrochlorothiazide (HYDRODIURIL) 12.5 MG tablet TAKE 1 TABLET BY MOUTH EVERY DAY WITH THE LOSARTAN 90 tablet 1   HYDROcodone-acetaminophen (NORCO/VICODIN) 5-325 MG tablet Take 1 tablet by mouth every 6 (six) hours as needed for severe pain. 5 tablet 0   JARDIANCE 25 MG TABS tablet TAKE 1 TABLET BY MOUTH EVERY DAY 90 tablet 1   lidocaine (LIDODERM) 5 % Place 1 patch onto the skin daily. Remove & Discard patch within 12  hours or as directed by MD 15 patch 0   losartan (COZAAR) 100 MG tablet TAKE 1/2 TABLET BY MOUTH EVERY DAY 45 tablet 1   metFORMIN (GLUCOPHAGE-XR) 500 MG 24 hr tablet TAKE 2 TABLETS BY MOUTH TWICE A DAY 360 tablet 0   nitroGLYCERIN (NITROSTAT) 0.4 MG SL tablet PLACE 1 TAB UNDER TONGUE EVERY 5 MIN AS NEEDED FOR CHEST PAIN 25 tablet 11   nitroGLYCERIN (NITROSTAT) 0.4 MG SL tablet Place 1 tablet (0.4 mg total) under the tongue every 5 (five) minutes as needed for chest pain. 25 tablet 3   nystatin cream (MYCOSTATIN) Apply  1 application topically 2 (two) times daily. 30 g 1   ondansetron (ZOFRAN ODT) 4 MG disintegrating tablet Take 1 tablet (4 mg total) by mouth every 8 (eight) hours as needed for nausea or vomiting. 20 tablet 0   OZEMPIC, 1 MG/DOSE, 4 MG/3ML SOPN INJECT 1 MG INTO THE SKIN ONCE A WEEK. 6 mL 3   rosuvastatin (CRESTOR) 5 MG tablet TAKE 1 TABLET BY MOUTH EVERY DAY 90 tablet 1   XARELTO 20 MG TABS tablet TAKE 1 TABLET BY MOUTH EVERY DAY BEFORE SUPPER 30 tablet 5   Accu-Chek Softclix Lancets lancets Use as instructed (Patient not taking: Reported on 12/07/2020) 100 each 12   doxycycline (VIBRAMYCIN) 100 MG capsule Take 1 capsule (100 mg total) by mouth 2 (two) times daily. (Patient not taking: Reported on 12/07/2020) 20 capsule 0   predniSONE (DELTASONE) 20 MG tablet Take 2 tablets (40 mg total) by mouth daily. Take 40 mg by mouth daily for 3 days, then $RemoveBe'20mg'ubANtiGeT$  by mouth daily for 3 days, then $RemoveBe'10mg'EGxFcSHhj$  daily for 3 days (Patient not taking: Reported on 12/07/2020) 12 tablet 0   No facility-administered medications prior to visit.    Review of Systems  Review of Systems  Constitutional: Negative.   Respiratory: Negative.    Cardiovascular: Negative.     Physical Exam  BP 118/78 (BP Location: Left Arm, Patient Position: Sitting, Cuff Size: Normal)   Pulse 72   Temp 97.8 F (36.6 C) (Oral)   Ht $R'5\' 11"'IM$  (1.803 m)   Wt 250 lb 6.4 oz (113.6 kg)   SpO2 98%   BMI 34.92 kg/m  Physical  Exam Constitutional:      Appearance: Normal appearance.  HENT:     Head: Normocephalic and atraumatic.     Mouth/Throat:     Mouth: Mucous membranes are moist.     Pharynx: Oropharynx is clear.  Cardiovascular:     Rate and Rhythm: Normal rate and regular rhythm.     Comments: RRR Pulmonary:     Effort: Pulmonary effort is normal.     Breath sounds: Normal breath sounds. No wheezing, rhonchi or rales.     Comments: CTA Neurological:     General: No focal deficit present.     Mental Status: He is alert and oriented to person, place, and time. Mental status is at baseline.  Psychiatric:        Mood and Affect: Mood normal.        Behavior: Behavior normal.        Thought Content: Thought content normal.        Judgment: Judgment normal.     Lab Results:  CBC    Component Value Date/Time   WBC 9.0 09/15/2020 2246   RBC 5.01 09/15/2020 2246   HGB 15.4 09/15/2020 2246   HGB 14.0 07/12/2016 0931   HCT 47.1 09/15/2020 2246   HCT 39.6 07/12/2016 0931   PLT 382 09/15/2020 2246   PLT 399 (H) 07/12/2016 0931   MCV 94.0 09/15/2020 2246   MCV 90 07/12/2016 0931   MCH 30.7 09/15/2020 2246   MCHC 32.7 09/15/2020 2246   RDW 13.2 09/15/2020 2246   RDW 12.9 07/12/2016 0931   LYMPHSABS 2.7 09/15/2020 2246   MONOABS 0.8 09/15/2020 2246   EOSABS 0.2 09/15/2020 2246   BASOSABS 0.1 09/15/2020 2246    BMET    Component Value Date/Time   NA 133 (L) 09/15/2020 2246   NA 137 07/12/2016 0931   K 3.5 09/15/2020 2246  CL 98 09/15/2020 2246   CO2 28 09/15/2020 2246   GLUCOSE 163 (H) 09/15/2020 2246   BUN 19 09/15/2020 2246   BUN 17 07/12/2016 0931   CREATININE 1.30 (H) 09/15/2020 2246   CREATININE 1.24 01/17/2019 1630   CALCIUM 9.6 09/15/2020 2246   GFRNONAA >60 09/15/2020 2246   GFRAA 91 07/12/2016 0931    BNP    Component Value Date/Time   BNP 6.9 06/25/2016 1552    ProBNP    Component Value Date/Time   PROBNP 49.4 11/26/2013 0525    Imaging: No results  found.   Assessment & Plan:   OSA (obstructive sleep apnea) - Hx severe OSA. HST 07/19/15 showed severe obstructive sleep apnea, AHI 36.4/hr with SpO2 low 80%. Maintained on auto CPAP 5-15cm h20. Patient is not consistently compliant with CPAP use. He was concerned about philip's recall, I have checked his serial number on their website and determined his current CPAP machine should not have been affected by the recall. He is due for new machine as it has been 5 years since he received this machine. DME order was placed back in March 2022. Advised it is ok for him to resume CPAP use until he gets new machine. Ask him to aim to wear 4-6 hours or longer every night. FU in 6 months or sooner if needed.   Pulmonary embolism ([place]) - CTA 11/25/13 + subsegmental level; pos fam hx dvt mother/sister - Maintained on lifetime anticoagulation with Xarelto 23m daily   Encounter for pre-operative respiratory clearance - Patient is considered low-intermediate risk for post-op pulmonary complications d/t hx OSA and PE. Ultimate clearance will be decided by orthopedic surgeon/anesthesiologist. If he needs to come off blood thinner for >3 days would recommend he be placed on Lovenox.   Major Pulmonary risks identified in the multifactorial risk analysis are but not limited to a) pneumonia; b) recurrent intubation risk; c) prolonged or recurrent acute respiratory failure needing mechanical ventilation; d) prolonged hospitalization; e) DVT/Pulmonary embolism; f) Acute Pulmonary edema  Recommend 1. Short duration of surgery as much as possible and avoid paralytic if possible 2. Recovery in step down or ICU with Pulmonary consultation 3. DVT prophylaxis 4. Aggressive pulmonary toilet with o2, bronchodilatation, and incentive spirometry and early ambulation    1) RISK FOR PROLONGED MECHANICAL VENTILAION - > 48h  1A) Arozullah - Prolonged mech ventilation risk Arozullah Postperative Pulmonary Risk Score - for mech  ventilation dependence >48h ([employer] Ann Surg 2000, major non-cardiac surgery) Comment Score  Type of surgery - abd ao aneurysm (27), thoracic (21), neurosurgery / upper abdominal / vascular (21), neck (11) L4-5 decompression 21  Emergency Surgery - (11)  0  ALbumin < 3 or poor nutritional state - (9)  0  BUN > 30 -  (8)  0  Partial or completely dependent functional status - (7)  0  COPD -  (6)  0  Age - 60 to 69 (4), > 70  (6)  0  TOTAL  21  Risk Stratifcation scores  - < 10 (0.5%), 11-19 (1.8%), 20-27 (4.2%), 28-40 (10.1%), >40 (26.6%)  4.2%      1B) GUPTA - Prolonged Mech Vent Risk Score source Risk  Guptal post op prolonged mech ventilation > 48h or reintubation < 30 days - ACS 2007-2008 dataset - hhttp://lewis-perez.[email or web address] % Risk of mechanical ventilation for >48 hrs after surgery, or unplannedintubation ?30 days of surgery    2) RISK FOR POST OP PNEUMONIA Score source Risk  Lyndel Safe - Post Op Pnemounia risk  [email or web address] 0.3 % Risk of postoperative pneumonia     R3) ISK FOR ANY POST-OP PULMONARY COMPLICATION Score source Risk  CANET/ARISCAT Score - risk for ANY/ALl pulmonary complications - > risk of in-hospital post-op pulmonary complications (composite including respiratory failure, respiratory infection, pleural effusion, atelectasis, pneumothorax, bronchospasm, aspiration pneumonitis) [employer] - based on age, anemia, pulse ox, resp infection prior 30d, incision site, duration of surgery, and emergency v elective surgery Low risk 1.6% risk of in-hospital post-op pulmonary complications (composite including respiratory failure, respiratory infection, pleural effusion, atelectasis,pneumothorax, bronchospasm, aspiration pneumonitis)   40 mins spent, >50% face to face  Martyn Ehrich, NP 12/07/2020

## 2020-12-07 NOTE — Patient Instructions (Addendum)
I check on philips webside and it states that your serial number is not recognized as an affected device. Based on this I believe it is ok to continue to use your machine. You can call the number below to contact [employer]ips to double check  "Serial number entered is not recognized as an affected device. Please verify that the serial number entered is correct and check your unit again. Also, please verify that you are locating the serial number from your CPAP, BiLevel PAP or Ventilator device directly and not any attached humidifier (the water / reservoir tank). If you believe this is an error, please go to [employer] list here for a phone number where you may speak with an agent."   [place] / [place] / [place]  [phone]  Monday to Friday 8am to 5pm [place] Time ([place]   ____________________________________________________________________________________________  Bonita Quin are considered low-intermediate risk for post op pulmonary complications d/t hx OSA and PE  Recommend you resume wearing your CPAP every night for 4-6 hours or longer.  If you need to hold Xarelto for more than 3-5 days will likely recommend that you be placed on Lovenox shots while off blood thinner.   Recommend 1. Short duration of surgery as much as possible and avoid paralytic if possible 2. Recovery in step down or ICU with Pulmonary consultation 3. DVT prophylaxis 4. Aggressive pulmonary toilet with o2, bronchodilatation, and incentive spirometry and early ambulation  __________________________________________________________________________________________  Follow-up 6 months with Dr. Craige Cotta

## 2020-12-07 NOTE — Assessment & Plan Note (Signed)
-   Patient is considered low-intermediate risk for post-op pulmonary complications d/t hx OSA and PE. Ultimate clearance will be decided by orthopedic surgeon/anesthesiologist. If he needs to come off blood thinner for >3 days would recommend he be placed on Lovenox.   Major Pulmonary risks identified in the multifactorial risk analysis are but not limited to a) pneumonia; b) recurrent intubation risk; c) prolonged or recurrent acute respiratory failure needing mechanical ventilation; d) prolonged hospitalization; e) DVT/Pulmonary embolism; f) Acute Pulmonary edema  Recommend 1. Short duration of surgery as much as possible and avoid paralytic if possible 2. Recovery in step down or ICU with Pulmonary consultation 3. DVT prophylaxis 4. Aggressive pulmonary toilet with o2, bronchodilatation, and incentive spirometry and early ambulation

## 2020-12-08 ENCOUNTER — Ambulatory Visit (INDEPENDENT_AMBULATORY_CARE_PROVIDER_SITE_OTHER): Payer: Medicare Other | Admitting: Family Medicine

## 2020-12-08 ENCOUNTER — Other Ambulatory Visit: Payer: Self-pay | Admitting: Family Medicine

## 2020-12-08 ENCOUNTER — Encounter: Payer: Self-pay | Admitting: Family Medicine

## 2020-12-08 VITALS — BP 115/77 | HR 74 | Temp 97.3°F | Resp 16 | Ht 71.0 in | Wt 254.0 lb

## 2020-12-08 DIAGNOSIS — I10 Essential (primary) hypertension: Secondary | ICD-10-CM

## 2020-12-08 DIAGNOSIS — Z955 Presence of coronary angioplasty implant and graft: Secondary | ICD-10-CM | POA: Diagnosis not present

## 2020-12-08 DIAGNOSIS — E7800 Pure hypercholesterolemia, unspecified: Secondary | ICD-10-CM | POA: Diagnosis not present

## 2020-12-08 DIAGNOSIS — Z7901 Long term (current) use of anticoagulants: Secondary | ICD-10-CM

## 2020-12-08 DIAGNOSIS — E118 Type 2 diabetes mellitus with unspecified complications: Secondary | ICD-10-CM | POA: Diagnosis not present

## 2020-12-08 DIAGNOSIS — Z01818 Encounter for other preprocedural examination: Secondary | ICD-10-CM

## 2020-12-08 DIAGNOSIS — I2510 Atherosclerotic heart disease of native coronary artery without angina pectoris: Secondary | ICD-10-CM | POA: Diagnosis not present

## 2020-12-08 LAB — CBC WITH DIFFERENTIAL/PLATELET
Basophils Absolute: 0.1 10*3/uL (ref 0.0–0.1)
Basophils Relative: 1.1 % (ref 0.0–3.0)
Eosinophils Absolute: 0.2 10*3/uL (ref 0.0–0.7)
Eosinophils Relative: 2.7 % (ref 0.0–5.0)
HCT: 43.2 % (ref 39.0–52.0)
Hemoglobin: 14.3 g/dL (ref 13.0–17.0)
Lymphocytes Relative: 26.8 % (ref 12.0–46.0)
Lymphs Abs: 1.5 10*3/uL (ref 0.7–4.0)
MCHC: 33 g/dL (ref 30.0–36.0)
MCV: 91.7 fl (ref 78.0–100.0)
Monocytes Absolute: 0.5 10*3/uL (ref 0.1–1.0)
Monocytes Relative: 8.1 % (ref 3.0–12.0)
Neutro Abs: 3.5 10*3/uL (ref 1.4–7.7)
Neutrophils Relative %: 61.3 % (ref 43.0–77.0)
Platelets: 377 10*3/uL (ref 150.0–400.0)
RBC: 4.71 Mil/uL (ref 4.22–5.81)
RDW: 13.2 % (ref 11.5–15.5)
WBC: 5.7 10*3/uL (ref 4.0–10.5)

## 2020-12-08 LAB — COMPREHENSIVE METABOLIC PANEL
ALT: 18 U/L (ref 0–53)
AST: 13 U/L (ref 0–37)
Albumin: 4.3 g/dL (ref 3.5–5.2)
Alkaline Phosphatase: 31 U/L — ABNORMAL LOW (ref 39–117)
BUN: 21 mg/dL (ref 6–23)
CO2: 27 mEq/L (ref 19–32)
Calcium: 9.9 mg/dL (ref 8.4–10.5)
Chloride: 97 mEq/L (ref 96–112)
Creatinine, Ser: 1.26 mg/dL (ref 0.40–1.50)
GFR: 64.69 mL/min (ref >60.00)
Glucose, Bld: 185 mg/dL — ABNORMAL HIGH (ref 70–99)
Potassium: 4.8 mEq/L (ref 3.5–5.1)
Sodium: 135 mEq/L (ref 135–145)
Total Bilirubin: 0.5 mg/dL (ref 0.2–1.2)
Total Protein: 6.9 g/dL (ref 6.0–8.3)

## 2020-12-08 LAB — HEMOGLOBIN A1C: Hgb A1c MFr Bld: 7.9 % — ABNORMAL HIGH (ref 4.6–6.5)

## 2020-12-08 NOTE — Telephone Encounter (Signed)
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 Rx being prescribed by pulmonology. Recommend that Xarelto clearance come from prescribing MD.

## 2020-12-08 NOTE — Progress Notes (Signed)
OFFICE VISIT  12/08/2020  CC:  Chief Complaint  Patient presents with   Surgical clearance    Planned surgery with Dr.Brooks on 8/24   HPI:    Patient is a 55 y.o. Caucasian male who presents accompanied by his wife Lattie Haw for presurgical clearance. He has DM 2 managed by Dr. Loanne Drilling in endocrinology. Anx/dep managed by Dr. Toy Care, psychiatrist.  I last saw him for f/u chronic illnesses about 10 months ago. A/P as of last visit: "1) HTN: The current medical regimen is effective;  continue present plan and medications. Lytes/cr today.   2) HLD: tolerating statin and fibrate. FLP and hepatic panel today.   3) Hx of recurrent DVT, on chronic OAC.  No sign of bleeding. CBC today.   4) CAD, hx of stent.  Cont plavix and statin.   5) DM: as per Dr Loanne Drilling.   6) Anx/dep: as per Dr. Toy Care.   7)  Preventative health: cologuard repeat is due for colon ca screening-->given today. Prostate ca screening: DRE today, PSA today. Flu vaccine given today. Tdap->rx to pharmacy today. Shingrix rx sent to pharmacy."  INTERIM HX: Has lumbar spinal stenosis, radiculopathy.  Left L4-5 decompression surgery planned for 01/05/21 (Dr. Melina Schools). Feeling good other than bad LB pain with pain and numbness going down L leg.  Prior to his back issue he was able to work around house and [place] w/out problem.  No CP or SOB or DOE.  Taking xarelto and plavix, no sign of bleeding.  His next f/u appt with endo (Dr. Loanne Drilling) is 02/18/21--he has cleared him already.   Cardiology cleared him already.  Has f/u with them 03/2021. Saw his pulm MD for f/u OSA yesterday, had not been on CPAP regularly b/c he thought his machine was part of a recall.  Turns out it was not, so he was encouraged to restart it nightly and pulm said he was low risk from pulm standpoint and cleared for surg.   Past Medical History:  Diagnosis Date   ANEMIA, PERNICIOUS 04/03/2007   Anxiety and depression     Arthritis    "left hand" (06/26/2016)   Chronic lower back pain    Colon cancer screening 10/27/2016   Cologuard NEG-->rpt 3 yrs   CORONARY ARTERY DISEASE 12/05/2006   DIABETES MELLITUS, TYPE II dx'd 2001   DVT (deep venous thrombosis) ([place]) 2016   BLE   GERD (gastroesophageal reflux disease)    History of balanitis    x 2. Holding off on circ as of 05/2020 urol f/u   History of blood transfusion 2001   "when I had my hand OR"   History of gout    History of kidney stones    HYPERCHOLESTEROLEMIA 07/17/2007   HYPERTENSION 12/05/2006   Learning disability    Migraine    "1-2/month" (06/26/2016)   Myocardial infarction [facility]) ~ 2003   Obesity, Class II, BMI 35-39.9    OSA on CPAP    Pt cannot recall setting clearly but thinks it is 5 cm h20.     PE (pulmonary embolism) 2016   Stroke [facility]) ~ 2006   denies residual on 06/26/2016   TIA (transient ischemic attack) ?06/25/2016   VISUAL ACUITY, DECREASED, RIGHT EYE 02/19/2009    Past Surgical History:  Procedure Laterality Date   CARDIAC CATHETERIZATION  1990s; 2012; 06/2016   06/2016 distal LAD DES stent.  EF 55-65%   CARDIOVASCULAR STRESS TEST  06/2016   Echo stress test->"inconclusive"-->cath was  done after this   CATARACT EXTRACTION W/ INTRAOCULAR LENS IMPLANT Right 2017   CORONARY STENT INTERVENTION N/A 06/27/2016   Procedure: Coronary Stent Intervention;  Surgeon: Leonie Man, MD;  Location: [place] [facility];  Service: Cardiovascular;  Laterality: N/A;   EYE SURGERY Right    Cataract   INGUINAL HERNIA REPAIR Bilateral 1990s   INTRAVASCULAR PRESSURE WIRE/FFR STUDY N/A 06/27/2016   Procedure: Intravascular Pressure Wire/FFR Study;  Surgeon: Leonie Man, MD;  Location: [place] [facility];  Service: Cardiovascular;  Laterality: N/A;   LEFT HEART CATH AND CORONARY ANGIOGRAPHY N/A 06/27/2016   Procedure: Left Heart Cath and Coronary Angiography;  Surgeon: Leonie Man, MD;  Location: [place] [facility];  Service: Cardiovascular;   Laterality: N/A;   REATTACHMENT HAND Left ~ 2001   w/carpal tunnel release   UMBILICAL HERNIA REPAIR  1990s   w/IHR   Social History   Socioeconomic History   Marital status: Married    Spouse name: Not on file   Number of children: Not on file   Years of education: Not on file   Highest education level: Not on file  Occupational History   Occupation: Disabled    Employer: DISABLED  Tobacco Use   Smoking status: Former    Packs/day: 0.10    Years: 3.00    Pack years: 0.30    Types: Cigarettes    Start date: 1985    Quit date: 05/15/1984    Years since quitting: 36.5   Smokeless tobacco: Never  Vaping Use   Vaping Use: Never used  Substance and Sexual Activity   Alcohol use: No   Drug use: No   Sexual activity: Not on file  Other Topics Concern   Not on file  Social History Narrative   Married, has 2 sons, no grandchildren.   Orig from [place], born in [place].   Disabled due to left wrist injury at work.   Tobacco: none   Alc: none   Social Determinants of Radio broadcast assistant Strain: Low Risk    Difficulty of Paying Living Expenses: Not hard at all  Food Insecurity: No Food Insecurity   Worried About Charity fundraiser in the Last Year: Never true   Ran Out of Food in the Last Year: Never true  Transportation Needs: No Transportation Needs   Lack of Transportation (Medical): No   Lack of Transportation (Non-Medical): No  Physical Activity: Inactive   Days of Exercise per Week: 0 days   Minutes of Exercise per Session: 0 min  Stress: No Stress Concern Present   Feeling of Stress : Not at all  Social Connections: Moderately Integrated   Frequency of Communication with Friends and Family: More than three times a week   Frequency of Social Gatherings with Friends and Family: More than three times a week   Attends Religious Services: Never   Marine scientist or Organizations: Yes   Attends Music therapist: 1 to 4 times per year    Marital Status: Married   Family History  Problem Relation Age of Onset   COPD Mother    Lymphoma Father     Outpatient Medications Prior to Visit  Medication Sig Dispense Refill   acetaminophen (TYLENOL) 500 MG tablet Take 1,000 mg by mouth every 6 (six) hours as needed.     Blood Glucose Monitoring Suppl (ACCU-CHEK GUIDE ME) w/Device KIT USE TO CHECK BLOOD SUGAR 1 TIME PER DAY. DX  CODE: E11.9 1 kit 0   bromocriptine (PARLODEL) 2.5 MG tablet Take 1 tablet (2.5 mg total) by mouth daily. 45 tablet 1   clopidogrel (PLAVIX) 75 MG tablet TAKE 1 TABLET BY MOUTH EVERY DAY 90 tablet 3   Cyanocobalamin (VITAMIN B12 PO) Take by mouth daily.     cyclobenzaprine (FLEXERIL) 10 MG tablet Take 1 tablet (10 mg total) by mouth 3 (three) times daily as needed for muscle spasms. 15 tablet 0   diazepam (VALIUM) 10 MG tablet Take 10 mg by mouth 3 (three) times daily.     DULoxetine (CYMBALTA) 30 MG capsule Take 30 mg by mouth daily.     EPINEPHrine 0.3 mg/0.3 mL IJ SOAJ injection Inject 0.3 mLs (0.3 mg total) into the muscle as needed for anaphylaxis. 2 each 1   fenofibrate (TRICOR) 145 MG tablet TAKE 1 TABLET BY MOUTH EVERY DAY 90 tablet 1   FLUoxetine (PROZAC) 40 MG capsule Take 40 mg by mouth daily.     fluticasone (FLONASE) 50 MCG/ACT nasal spray USE ONE SPRAY IN EACH NOSTRIL TWICE A DAY 16 g 2   glucose blood (ACCU-CHEK GUIDE) test strip check your blood sugar once a day 100 each 12   hydrochlorothiazide (HYDRODIURIL) 12.5 MG tablet TAKE 1 TABLET BY MOUTH EVERY DAY WITH THE LOSARTAN 90 tablet 1   HYDROcodone-acetaminophen (NORCO/VICODIN) 5-325 MG tablet Take 1 tablet by mouth every 6 (six) hours as needed for severe pain. 5 tablet 0   JARDIANCE 25 MG TABS tablet TAKE 1 TABLET BY MOUTH EVERY DAY 90 tablet 1   losartan (COZAAR) 100 MG tablet TAKE 1/2 TABLET BY MOUTH EVERY DAY 45 tablet 1   metFORMIN (GLUCOPHAGE-XR) 500 MG 24 hr tablet TAKE 2 TABLETS BY MOUTH TWICE A DAY 360 tablet 0   nystatin cream  (MYCOSTATIN) Apply 1 application topically 2 (two) times daily. 30 g 1   OZEMPIC, 1 MG/DOSE, 4 MG/3ML SOPN INJECT 1 MG INTO THE SKIN ONCE A WEEK. 6 mL 3   rosuvastatin (CRESTOR) 5 MG tablet TAKE 1 TABLET BY MOUTH EVERY DAY 90 tablet 1   XARELTO 20 MG TABS tablet TAKE 1 TABLET BY MOUTH EVERY DAY BEFORE SUPPER 30 tablet 5   fluconazole (DIFLUCAN) 150 MG tablet TAKE 1 TABLET BY MOUTH EVERY 7 DAYS (Patient not taking: Reported on 12/08/2020) 4 tablet 1   lidocaine (LIDODERM) 5 % Place 1 patch onto the skin daily. Remove & Discard patch within 12 hours or as directed by MD (Patient not taking: Reported on 12/08/2020) 15 patch 0   nitroGLYCERIN (NITROSTAT) 0.4 MG SL tablet PLACE 1 TAB UNDER TONGUE EVERY 5 MIN AS NEEDED FOR CHEST PAIN (Patient not taking: Reported on 12/08/2020) 25 tablet 11   nitroGLYCERIN (NITROSTAT) 0.4 MG SL tablet Place 1 tablet (0.4 mg total) under the tongue every 5 (five) minutes as needed for chest pain. (Patient not taking: Reported on 12/08/2020) 25 tablet 3   ondansetron (ZOFRAN ODT) 4 MG disintegrating tablet Take 1 tablet (4 mg total) by mouth every 8 (eight) hours as needed for nausea or vomiting. (Patient not taking: Reported on 12/08/2020) 20 tablet 0   No facility-administered medications prior to visit.    Allergies  Allergen Reactions   Actos [Pioglitazone] Other (See Comments)    Potential cause of DVT   Bee Venom Anaphylaxis   Klonopin [Clonazepam] Anaphylaxis   Invokana [Canagliflozin] Nausea Only    Nausea/dizzy/bad taste in mouth   Niacin Other (See Comments)    "makes  his crazy"    ROS As per HPI  PE: Vitals with BMI 12/08/2020 12/07/2020 09/29/2020  Height 5' 11" 5' 11" 5' 11"  Weight 254 lbs 250 lbs 6 oz 252 lbs  BMI 35.44 50.03 70.48  Systolic 889 169 450  Diastolic 77 78 80  Pulse 74 72 76   Gen: Alert, well appearing.  Patient is oriented to person, place, time, and situation. AFFECT: pleasant, lucid thought and speech. ENT: no injection,  icteris, swelling, or exudate.  EOMI, PERRLA. Nose: no drainage or turbinate edema/swelling.  No injection or focal lesion.  Mouth: lips without lesion/swelling.  Oral mucosa pink and moist.  Dentition intact and without obvious caries or gingival swelling.  Oropharynx without erythema, exudate, or swelling.  Neck: supple/nontender.  No LAD, mass, or TM.  Carotid pulses 2+ bilaterally, without bruits. CV: RRR, no m/r/g.   LUNGS: CTA bilat, nonlabored resps, good aeration in all lung fields. ABD: soft, NT, ND, BS normal.  No hepatospenomegaly or mass.  No bruits. EXT: no clubbing, cyanosis, or edema.  Musculoskeletal: no joint swelling, erythema, warmth, or tenderness.  ROM of all joints intact. Skin - no sores or suspicious lesions or rashes or color changes   LABS:  Lab Results  Component Value Date   TSH 2.73 10/10/2017   Lab Results  Component Value Date   WBC 9.0 09/15/2020   HGB 15.4 09/15/2020   HCT 47.1 09/15/2020   MCV 94.0 09/15/2020   PLT 382 09/15/2020   Lab Results  Component Value Date   CREATININE 1.30 (H) 09/15/2020   BUN 19 09/15/2020   NA 133 (L) 09/15/2020   K 3.5 09/15/2020   CL 98 09/15/2020   CO2 28 09/15/2020   Lab Results  Component Value Date   ALT 23 09/15/2020   AST 17 09/15/2020   ALKPHOS 39 09/15/2020   BILITOT 0.6 09/15/2020   Lab Results  Component Value Date   CHOL 145 01/20/2020   Lab Results  Component Value Date   HDL 63.60 01/20/2020   Lab Results  Component Value Date   LDLCALC 57 01/20/2020   Lab Results  Component Value Date   TRIG 122.0 01/20/2020   Lab Results  Component Value Date   CHOLHDL 2 01/20/2020   Lab Results  Component Value Date   PSA 0.18 01/20/2020   PSA 0.17 11/04/2018   PSA 0.26 10/10/2017   Lab Results  Component Value Date   HGBA1C 6.6 (A) 08/20/2020   IMPRESSION AND PLAN:  1) Preoperative clearance--all of his chronic medical diseases are well controlled with current mgmt. Will check cbc  and cmet as well as Hba1c today. Clearance form filled out and sent to ortho.  2) HTN: well controlled on losartan 100 qd and hctz 12.5 qd. Lytes/cr monitoring today. His GFR has been around 60---monitor this today.  3) HLD: tolerating statin and fibrate. FLP to be done at future fasting appt. Hepatic panel today.   4) Hx of recurrent DVT, on chronic OAC.  No sign of bleeding. CBC today.   5) CAD, hx of stent.  Cont plavix and statin.  Pt is in process of getting clearance from cardiology.  6) DM: as per Dr Loanne Drilling. Has been well controlled on ozempic, jardiance, and metformin. Will check Hba1c today and forward result to Dr. Loanne Drilling. Pt has approp endo f/u arranged.   7) Anx/dep: as per psychiatris Dr. Toy Care.  An After Visit Summary was printed and given to the patient.  FOLLOW UP: Return for keep routine f/u appt already set for 04/13/21.  Signed:  Crissie Sickles, MD           12/08/2020

## 2020-12-09 NOTE — Telephone Encounter (Signed)
Left message to call back 8:41 AM on 12/09/2020.  Patient needs call back.

## 2020-12-10 ENCOUNTER — Telehealth: Payer: Self-pay

## 2020-12-10 NOTE — Telephone Encounter (Signed)
Pt's wife returning call to [place]. Will forward message

## 2020-12-10 NOTE — Telephone Encounter (Signed)
   Primary Cardiologist: [name], MD  Chart reviewed as part of pre-operative protocol coverage. Given past medical history and time since last visit, based on ACC/AHA guidelines, Xavier [name]. would be at acceptable risk for the planned procedure without further cardiovascular testing.   His Plavix may be held for 5 days prior to his procedure.  Please resume as soon as hemostasis is achieved.  Pulmonology prescribes his Xarelto.  He will need to receive recommendations on holding his Xarelto from his pulmonology physician.  Patient was advised that if he develops new symptoms prior to surgery to contact our office to arrange a follow-up appointment.  He verbalized understanding.  I will route this recommendation to the requesting party via Epic fax function and remove from pre-op pool.  Please call with questions.  Thomasene Ripple. Wissam Resor NP-C    12/10/2020, 12:13 PM [facility] [address] Office [phone] Fax [phone]

## 2020-12-10 NOTE — Telephone Encounter (Signed)
Xavier [name]. 55 year old male is requesting L4-5 decompression.  He was last seen in clinic on 08/19/2020.  He denied angina at that time.  He reported that he liked fishing and [place] shooting.  He had been vaccinated for COVID.  He reported some chronic shoulder pain and left lower extremity neuropathic type pain.  His cardiac catheterization 2/18 showed normal LVEF, 70% distal LAD stenosis with 0.77 physiologically significant lesion which was treated with DES.  Follow-up was planned for 6 months.  Has PMH includes CAD, HTN, HLD, DVT/PE, DM2, mild renal insufficiency, and manubrium.  May his Plavix be held prior to his surgery?  Thank you for your help.  Please direct response to CV DIV preop pool.  Xavier [name]. Katheryn Culliton NP-C    12/10/2020, 8:53 AM [facility] [address] Office [phone] Fax [phone]

## 2020-12-13 NOTE — Progress Notes (Signed)
Reviewed and agree with assessment/plan.   Bracen Schum, MD [facility] 12/13/2020, 8:23 AM Pager:  [phone]  

## 2020-12-15 NOTE — Telephone Encounter (Signed)
Please see clearance encounter on 7/29.

## 2020-12-17 NOTE — Telephone Encounter (Signed)
LMTCB to schedule appointment °

## 2020-12-23 ENCOUNTER — Ambulatory Visit (HOSPITAL_COMMUNITY)
Admission: RE | Admit: 2020-12-23 | Discharge: 2020-12-23 | Disposition: A | Discharge status: Home / Self Care | Payer: Medicare Other | Source: Ambulatory Visit | Attending: Orthopedic Surgery | Admitting: Orthopedic Surgery

## 2020-12-23 ENCOUNTER — Encounter (HOSPITAL_COMMUNITY)
Admission: RE | Admit: 2020-12-23 | Discharge: 2020-12-23 | Disposition: A | Discharge status: Home / Self Care | Payer: Medicare Other | Source: Ambulatory Visit | Attending: Orthopedic Surgery | Admitting: Orthopedic Surgery

## 2020-12-23 ENCOUNTER — Encounter (HOSPITAL_COMMUNITY): Payer: Self-pay

## 2020-12-23 ENCOUNTER — Other Ambulatory Visit: Payer: Self-pay

## 2020-12-23 DIAGNOSIS — Z7984 Long term (current) use of oral hypoglycemic drugs: Secondary | ICD-10-CM | POA: Insufficient documentation

## 2020-12-23 DIAGNOSIS — Z7901 Long term (current) use of anticoagulants: Secondary | ICD-10-CM | POA: Diagnosis not present

## 2020-12-23 DIAGNOSIS — Z8673 Personal history of transient ischemic attack (TIA), and cerebral infarction without residual deficits: Secondary | ICD-10-CM | POA: Diagnosis not present

## 2020-12-23 DIAGNOSIS — E669 Obesity, unspecified: Secondary | ICD-10-CM | POA: Diagnosis not present

## 2020-12-23 DIAGNOSIS — G4733 Obstructive sleep apnea (adult) (pediatric): Secondary | ICD-10-CM | POA: Diagnosis not present

## 2020-12-23 DIAGNOSIS — M48061 Spinal stenosis, lumbar region without neurogenic claudication: Secondary | ICD-10-CM | POA: Insufficient documentation

## 2020-12-23 DIAGNOSIS — E119 Type 2 diabetes mellitus without complications: Secondary | ICD-10-CM | POA: Diagnosis not present

## 2020-12-23 DIAGNOSIS — E7800 Pure hypercholesterolemia, unspecified: Secondary | ICD-10-CM | POA: Insufficient documentation

## 2020-12-23 DIAGNOSIS — Z01818 Encounter for other preprocedural examination: Secondary | ICD-10-CM | POA: Insufficient documentation

## 2020-12-23 DIAGNOSIS — M7138 Other bursal cyst, other site: Secondary | ICD-10-CM | POA: Insufficient documentation

## 2020-12-23 DIAGNOSIS — Z79899 Other long term (current) drug therapy: Secondary | ICD-10-CM | POA: Diagnosis not present

## 2020-12-23 DIAGNOSIS — Z87891 Personal history of nicotine dependence: Secondary | ICD-10-CM | POA: Insufficient documentation

## 2020-12-23 DIAGNOSIS — N189 Chronic kidney disease, unspecified: Secondary | ICD-10-CM | POA: Diagnosis not present

## 2020-12-23 DIAGNOSIS — D510 Vitamin B12 deficiency anemia due to intrinsic factor deficiency: Secondary | ICD-10-CM | POA: Diagnosis not present

## 2020-12-23 DIAGNOSIS — Z7902 Long term (current) use of antithrombotics/antiplatelets: Secondary | ICD-10-CM | POA: Diagnosis not present

## 2020-12-23 DIAGNOSIS — I252 Old myocardial infarction: Secondary | ICD-10-CM | POA: Insufficient documentation

## 2020-12-23 DIAGNOSIS — Z86718 Personal history of other venous thrombosis and embolism: Secondary | ICD-10-CM | POA: Insufficient documentation

## 2020-12-23 DIAGNOSIS — Z6833 Body mass index (BMI) 33.0-33.9, adult: Secondary | ICD-10-CM | POA: Insufficient documentation

## 2020-12-23 HISTORY — DX: Peripheral vascular disease, unspecified: I73.9

## 2020-12-23 LAB — URINALYSIS, ROUTINE W REFLEX MICROSCOPIC
Bacteria, UA: NONE SEEN
Bilirubin Urine: NEGATIVE
Glucose, UA: >=500 mg/dL — AB
Hgb urine dipstick: NEGATIVE
Ketones, ur: 5 mg/dL — AB
Nitrite: NEGATIVE
Protein, ur: NEGATIVE mg/dL
Specific Gravity, Urine: 1.029 (ref 1.005–1.030)
pH: 5 (ref 5.0–8.0)

## 2020-12-23 LAB — PROTIME-INR
INR: 1.6 — ABNORMAL HIGH (ref 0.8–1.2)
Prothrombin Time: 19.2 seconds — ABNORMAL HIGH (ref 11.4–15.2)

## 2020-12-23 LAB — SURGICAL PCR SCREEN
MRSA, PCR: NEGATIVE
Staphylococcus aureus: NEGATIVE

## 2020-12-23 LAB — GLUCOSE, CAPILLARY: Glucose-Capillary: 161 mg/dL — ABNORMAL HIGH (ref 70–99)

## 2020-12-23 LAB — APTT: aPTT: 41 seconds — ABNORMAL HIGH (ref 24–36)

## 2020-12-23 NOTE — Pre-Procedure Instructions (Addendum)
Xavier [name].  12/23/2020    To prepare for surgery:  You need to have a Covid test on Monday, 8/22. No appointment needed, anytime between 8:00 AM and 3:00 PM Drive up testing site. Address: [address] [place]. It is behind the white building.   Your procedure is scheduled on  Wednesday, August 24, 22.  Report to [facility] Admitting at 11:00 A.M.                  Your surgery or procedure is scheduled to begin at 1:00 PM   Call this number if you have problems the morning of surgery:  [phone]   Remember:  Do not eat or drink after midnight.  You may drink clear liquids until  10:00 .  Clear liquids allowed are:   Water, Juice (non-citric and without pulp - diabetics please choose diet or no sugar options), Carbonated beverages - (diabetics please choose diet or no sugar options), Clear Tea, Black Coffee only (no creamer, milk or cream including half and half), Plain Jell-O only (diabetics please choose diet or no sugar options), Gatorade (diabetics please choose diet or no sugar options), and Plain Popsicles only               Drink the 10 oz bottle of water between 9:30 an 10:00 . DO NOT Drink any other fluids after 10:AM    Take these medicines the morning of surgery with A SIP OF WATER : DULoxetine (CYMBALTA) fenofibrate (TRICOR) FLUoxetine (PROZAC) diazepam (VALIUM)  rosuvastatin (CRESTOR)  Take if needed: acetaminophen (TYLENOL) nitroGLYCERIN (NITROSTAT) oxyCODONE (OXY IR/ROXICODONE)  cyclobenzaprine (FLEXERIL)  Follow the instructions that were given to you by Dr Shon Baton regarding: clopidogrel (PLAVIX) and  XARELTO.  1 Week prior to surgery STOP taking  Aspirin Products (Goody Powder, Excedrin Migraine), Ibuprofen (Advil), Naproxen (Aleve), Vitamins and Herbal Products (ie Fish Oil).    WHAT DO I DO ABOUT MY DIABETES MEDICATION? Do Not take JARDIANCE  the day prior to surgery date- 01/04/21 Do not take oral diabetes medicines  (pills) the morning of surgery. The day of surgery, do not take other diabetes injectables, including Byetta (exenatide), Bydureon (exenatide ER), Victoza (liraglutide), or Trulicity (dulaglutide).  How to Manage Your Diabetes Before and After Surgery  Why is it important to control my blood sugar before and after surgery? Improving blood sugar levels before and after surgery helps healing and can limit problems. A way of improving blood sugar control is eating a healthy diet by:  Eating less sugar and carbohydrates  Increasing activity/exercise  Talking with your doctor about reaching your blood sugar goals High blood sugars (greater than 180 mg/dL) can raise your risk of infections and slow your recovery, so you will need to focus on controlling your diabetes during the weeks before surgery. Make sure that the doctor who takes care of your diabetes knows about your planned surgery including the date and location.  How do I manage my blood sugar before surgery? Check your blood sugar at least 4 times a day, starting 2 days before surgery, to make sure that the level is not too high or low. Check your blood sugar the morning of your surgery when you wake up and every 2 hours until you get to the Short Stay unit. If your blood sugar is less than 70 mg/dL, you will need to treat for low blood sugar: Do not take insulin. Treat a low blood sugar (less than  70 mg/dL) with  cup of clear juice (cranberry or apple), 4 glucose tablets, OR glucose gel. Recheck blood sugar in 15 minutes after treatment (to make sure it is greater than 70 mg/dL). If your blood sugar is not greater than 70 mg/dL on recheck, call [phone]  for further instructions. Report your blood sugar to the short stay nurse when you get to Short Stay.  If you are admitted to the hospital after surgery: Your blood sugar will be checked by the staff and you will probably be given insulin after surgery (instead of oral diabetes  medicines) to make sure you have good blood sugar levels. The goal for blood sugar control after surgery is 80-180 mg/dL.  Special instructions:  If you have a CPAP, bring the mask to the hospital with you.  [facility]- Preparing For Surgery  Before surgery, you can play an important role. Because skin is not sterile, your skin needs to be as free of germs as possible. You can reduce the number of germs on your skin by washing with CHG (chlorahexidine gluconate) Soap before surgery.  CHG is an antiseptic cleaner which kills germs and bonds with the skin to continue killing germs even after washing.    Oral Hygiene is also important to reduce your risk of infection.  Remember - BRUSH YOUR TEETH THE MORNING OF SURGERY WITH YOUR REGULAR TOOTHPASTE  Please do not use if you have an allergy to CHG or antibacterial soaps. If your skin becomes reddened/irritated stop using the CHG.  Do not shave (including legs and underarms) for at least 48 hours prior to first CHG shower. It is OK to shave your face.  Please follow these instructions carefully.   Shower the NIGHT BEFORE SURGERY and the MORNING OF SURGERY with CHG.   If you chose to wash your hair, wash your hair first as usual with your normal shampoo.  After you shampoo, wash your face and private area with the soap you use at home, then rinse your hair and body thoroughly to remove the shampoo and soap.  Use CHG as you would any other liquid soap. You can apply CHG directly to the skin and wash gently with a scrungie or a clean washcloth.   Apply the CHG Soap to your body ONLY FROM THE NECK DOWN.  Do not use on open wounds or open sores. Avoid contact with your eyes, ears, mouth and genitals (private parts).   Wash thoroughly, paying special attention to the area where your surgery will be performed.  Thoroughly rinse your body with warm water from the neck down.  DO NOT shower/wash with your normal soap after using and rinsing off the  CHG Soap.  Pat yourself dry with a CLEAN TOWEL.  Wear CLEAN PAJAMAS to bed the night before surgery, wear comfortable clothes the morning of surgery  Place CLEAN SHEETS on your bed the night of your first shower and DO NOT SLEEP WITH PETS.  Day of Surgery: Shower as instructed above. Do not apply any deodorants/lotions, powders or colognes.  Please wear clean clothes to the hospital/surgery center.   Remember to brush your teeth WITH YOUR REGULAR TOOTHPASTE.  Do not wear jewelry, make-up or nail polish.  Do not shave 48 hours prior to surgery.  Men may shave face and neck.  Do not bring valuables to the hospital.  [facility] is not responsible for any belongings or valuables.  Contacts, dentures or bridgework may not be worn into surgery.  Leave  your suitcase in the car.  After surgery it may be brought to your room.  For patients admitted to the hospital, discharge time will be determined by your treatment team.  Patients discharged the day of surgery will not be allowed to drive home.   Please read over the fact sheets that you were given.

## 2020-12-23 NOTE — Progress Notes (Signed)
PCP - Dr. Marvel Plan Cardiologist - Dr. Eden Emms  Chest x-ray - today EKG - 08/19/20 Stress Test - 2017 ECHO - 2015 Cardiac Cath - 2018  Sleep Study - home test 2017 CPAP - yes, will bring day of surgeyr  Fasting Blood Sugar - 140-160 Checks Blood Sugar ___1__ times a day A1C - 7.0 on 12/08/20  Blood Thinner Instructions: Pt was instructed to hold Plavix 5 days prior to surgery per cardiologist. Xarelto instructions from pulmonology was "if he needs to come off blood thinner for > 3 days would recommend he be placed on Lovenox" - from note dated 12/07/20 in Epic by Ames Dura, NP. I asked pt what the surgeon had told him and he said that we would tell him when to hold it. I explained to the pt that we do not instruct on blood thinners and he needs to call the surgeon and ask when he needs to stop and if he needs Lovenox.    ERAS Protcol - Yes PRE-SURGERY Ensure or G2- 10 oz water given to pt.  COVID TEST- to be done 4 days prior to surgery   Anesthesia review: yes, Dr. Shon Baton pt and heart history  Patient denies shortness of breath, fever, cough and chest pain at PAT appointment   All instructions explained to the patient, with a verbal understanding of the material. Patient agrees to go over the instructions while at home for a better understanding. Patient also instructed to self quarantine after being tested for COVID-19. The opportunity to ask questions was provided.

## 2020-12-27 ENCOUNTER — Telehealth: Payer: Self-pay | Admitting: Primary Care

## 2020-12-27 DIAGNOSIS — M5416 Radiculopathy, lumbar region: Secondary | ICD-10-CM | POA: Diagnosis not present

## 2020-12-27 NOTE — Telephone Encounter (Signed)
ATC patient unable to reach  Left message to call back to Awanda Wilcock's office.

## 2020-12-27 NOTE — Progress Notes (Signed)
Anesthesia Chart Review:  Case: [id number] Date/Time: 01/05/21 1245   Procedure: Left L4-5 Gill Decompression (Left)   Anesthesia type: General   Pre-op diagnosis: Lumbar facet cyst with left radicular leg pain   Location: [facility] OR ROOM 04 / [place] [facility]   Surgeons: Melina Schools, MD       DISCUSSION: Patient is a 55 year old male scheduled for the above procedure.  History includes former smoker (quit 05/15/84), CAD (reported MI ~ 2003->no flow-limiting CAD 06/02/02 LHC; DES dLAD 06/27/16), HTN, hypercholesterolemia, PE (RLL PE 11/25/13; recent DVT suspected although negative by [place] 11/26/13; on lifelong anticoagulation), DM2, pernicious anemia, CVA/TIA (~ 2006), OSA (severe OSA HST 07/19/15, AHI 36.3 with SpO2 low 80%; Maintained on auto CPAP 5-15cm H20), CKD, PVD, left hand trauma (open left radial/ulnar fracture s/p ORIF, external fixator 07/11/01), learning disability (not specified). Reported slow to wake up from anesthesia.  BMI is consistent with obesity.  Preoperative cardiology input outlined by Coletta Memos, NP, "Given past medical history and time since last visit, based on ACC/AHA guidelines, Nicole Hafley. would be at acceptable risk for the planned procedure without further cardiovascular testing.    His Plavix may be held for 5 days prior to his procedure.  Please resume as soon as hemostasis is achieved.  Pulmonology prescribes his Xarelto.  He will need to receive recommendations on holding his Xarelto from his pulmonology physician."   Last pulmonology evaluation was on 12/09/20 by Geraldo Pitter, NP. She wrote, "Patient is considered low-intermediate risk for post-op pulmonary complications d/t hx OSA and PE. Ultimate clearance will be decided by orthopedic surgeon/anesthesiologist. If he needs to come off blood thinner for >3 days would recommend he be placed on Lovenox... Recommend 1. Short duration of surgery as much as possible and avoid paralytic if possible 2. Recovery in step down or  ICU with Pulmonary consultation 3. DVT prophylaxis 4. Aggressive pulmonary toilet with o2, bronchodilatation, and incentive spirometry and early ambulation". On 12/27/20, she was reaching out to orthopedics to determine how long surgeon desires for Xarelto to be held, as to determine if Lovenox bridge will be required.   PCP preoperative evaluation by Dr. Anitra Lauth on 12/08/20. He wrote, "Preoperative clearance--all of his chronic medical diseases are well controlled with current mgmt.  Will check cbc and cmet as well as Hba1c today.  Clearance form filled out and sent to ortho." He did forward A1c results of 7.9% to Dr. Loanne Drilling for any additional recommendations.  Last endocrinology visit 08/20/20 with Dr. Loanne Drilling. He had not recorded CBGs, but reported they were controlled and taking medication as prescribed. Ac1 6.6%, and no changes made. Six month follow-up planned. Since then A1c up to 7.9% (ordered by Dr. Anitra Lauth) which he routed to Dr. Loanne Drilling for additional recommendations. Patient reported home CBGs ~ 140-160.   Labs from 7/2/722 and 12/23/20 noted. Results showed aPTT 41, PT/INR 19.2/1.6. Message left with Judeen Hammans regarding A1c results and PT/PTT results. Unless, otherwise notified by Orthopedics, will order repeat PT/PTT on the day of surgery.  He will also get a CBG. Creatinine appears stable at 1.26.    Presurgical COVID-19 test is scheduled for < 4 days from surgery date. Anesthesia team to evaluate on the day of surgery. Orthopedic documentation indicates that he may need step down unit post-operatively due to his multiple co-morbidities.    VS: BP 128/84   Pulse 77   Temp 36.9 C (Oral)   Resp 17   Ht 6' (1.829 m)  Wt 112.4 kg   SpO2 98%   BMI 33.61 kg/m   PROVIDERS: [place], Adrian Blackwater, MD is PCP  Jenkins Rouge, MD is cardiologist Chesley Mires, MD is pulmonlogist [name], MD is endocrinologist Chucky May, MD is psychiatrist   LABS: 12/08/20 and 12/23/20 labs noted.  See DISCUSSION. (all labs ordered are listed, but only abnormal results are displayed)  Labs Reviewed  GLUCOSE, CAPILLARY - Abnormal; Notable for the following components:      Result Value   Glucose-Capillary 161 (*)    All other components within normal limits  PROTIME-INR - Abnormal; Notable for the following components:   Prothrombin Time 19.2 (*)    INR 1.6 (*)    All other components within normal limits  APTT - Abnormal; Notable for the following components:   aPTT 41 (*)    All other components within normal limits  URINALYSIS, ROUTINE W REFLEX MICROSCOPIC - Abnormal; Notable for the following components:   APPearance CLOUDY (*)    Glucose, UA >=500 (*)    Ketones, ur 5 (*)    Leukocytes,Ua SMALL (*)    All other components within normal limits  SURGICAL PCR SCREEN     IMAGES: CXR 12/23/20: FINDINGS: The heart size and mediastinal contours are within normal limits. Both lungs are clear. The visualized skeletal structures are unremarkable. IMPRESSION: No active cardiopulmonary disease.  CT chest/abd/pelvis 09/16/20:  IMPRESSION: 1. No abnormality which corresponds to the area of growing bruising and redness to the patient's mid back. 2. No acute or active cardiopulmonary disease. 3. Simple right renal cysts.  CT Chest 09/10/20: IMPRESSION: - Hook-like morphology of the inferior xiphoid process, corresponding to the palpable abnormality, unchanged from 2015. - No evidence of acute cardiopulmonary disease. - Aortic Atherosclerosis (ICD10-I70.0).   EKG: 08/19/20 ([facility]): NSR, possible LAE. Lateral T wave abnormality, consider ischemia.    CV: Cardiac cath 06/27/16: Left Main  Vessel is large.  Left Anterior Descending  Vessel is large. After giving rise to large caliber second diagonal branch, the vessel itself actually tapers into a medium caliber vessel distally.  Culprit lesion. The lesion is distal to major branch, discrete and smooth. The stenosis  was measured by a visual reading. Pressure wire/FFR was performed on the lesion. FFR: 0.77. Initial FFR 0.87  The lesion is tubular and smooth.  First Diagonal Branch  Vessel is moderate in size. Vessel is angiographically normal.  Lateral First Diagonal Branch  Vessel is small in size.  First Septal Branch  Vessel is small in size.  Second Diagonal Branch  Vessel is moderate in size.  Lateral Second Diagonal Branch  Vessel is small in size.  Second Septal Branch  Vessel is small in size.  Third Septal Branch  Vessel is small in size.  Left Circumflex  Vessel is large. Vessel is angiographically normal.  First Obtuse Marginal Branch  Vessel is large in size. Vessel is angiographically normal.  Lateral First Obtuse Marginal Branch  Vessel is small in size.  Right Coronary Artery  Vessel is large.  Acute Marginal Branch  Vessel is small in size.  Right Posterior Descending Artery  Vessel is large in size. Vessel is angiographically normal. The vessel is tortuous.  Inferior Septal  Vessel is small in size.  Right Posterior Atrioventricular Artery  Vessel is large in size. Vessel is angiographically normal. The vessel is tortuous.  First Right Posterolateral Branch  Vessel is large in size. Vessel is angiographically normal.  Second Right Posterolateral Branch  Vessel  is small in size.   The left ventricular systolic function is normal. The left ventricular ejection fraction is 55-65% by visual estimate. LV end diastolic pressure is normal. ________Pertinent Coronary Anatomy Findings__________ Dist LAD-1 lesion, 70 %stenosed. FFR 0.77 (Physiologically Significant) A STENT SYNERGY DES 2.25X12 drug eluting stent was successfully placed (post-dilated to 2.5 mm) Post intervention, there is a 0% residual stenosis.   Successful FFR guided PCI of distal LAD focal 70-75% lesion with DES stent. Otherwise no sniffing CAD noted.   Plan: Return to nursing unit for ongoing care and TR  band removal. Would continue aspirin plus Brilinta for minimum of 3 months. After that would be okay to stop aspirin if necessary. Continue risk factor modification by Cardiology Consultation Team   Stress echo 06/26/16: Study Conclusions  - Stress ECG conclusions: There were no stress arrhythmias or    conduction abnormalities. The stress ECG was negative for    ischemia.  - Impressions: No evidence of ischemia at heart rate achived but    patient only reached 55% of MPHR and there for nodiagnostic study    for ischemia.   Impressions:  - No evidence of ischemia at heart rate achived but patient only    reached 55% of MPHR and there for nodiagnostic study for    ischemia.    Echo 11/26/13: Study Conclusions  - Left ventricle: The cavity size was normal. Wall thickness was    normal. Systolic function was normal. The estimated ejection    fraction was in the range of 60% to 65%. Wall motion was normal;    there were no regional wall motion abnormalities.     Past Medical History:  Diagnosis Date   ANEMIA, PERNICIOUS 04/03/2007   Anxiety and depression    Arthritis    "left hand" (06/26/2016)   Chronic lower back pain    grd I degen spondylolisthesis at L4-5 w/associated synovial cyst at facet at this level +underlying severe epidural lipomatosis L5-S1 up to L4-5--plan for surg w/Dr. Rolena Infante 12/2020.   Chronic renal insufficiency, stage 3 (moderate) ([place]) 2022   GFR around 60   Colon cancer screening 10/27/2016   Cologuard NEG-->rpt 3 yrs   Complication of anesthesia    slow to wake up   CORONARY ARTERY DISEASE 12/05/2006   DIABETES MELLITUS, TYPE II dx'd 2001   DVT (deep venous thrombosis) ([place]) 2016   BLE   GERD (gastroesophageal reflux disease)    History of balanitis    x 2. Holding off on circ as of 05/2020 urol f/u   History of blood transfusion 2001   "when I had my hand OR"   History of gout    History of kidney stones    HYPERCHOLESTEROLEMIA 07/17/2007    HYPERTENSION 12/05/2006   Learning disability    Migraine    "1-2/month" (06/26/2016)   Myocardial infarction ([place]) ~ 2003   Obesity, Class II, BMI 35-39.9    OSA on CPAP    Pt cannot recall setting clearly but thinks it is 5 cm h20.     PE (pulmonary embolism) 2016   Peripheral vascular disease ([employer])    Simple renal cyst    X 2 on R kidney (one 5 cm and one 2 cm)   Stroke ([place]) ~ 2006   denies residual on 06/26/2016   TIA (transient ischemic attack) ?06/25/2016   VISUAL ACUITY, DECREASED, RIGHT EYE 02/19/2009    Past Surgical History:  Procedure Laterality Date   CARDIAC CATHETERIZATION  1990s;  2012; 06/2016   06/2016 distal LAD DES stent.  EF 55-65%   CARDIOVASCULAR STRESS TEST  06/2016   Echo stress test->"inconclusive"-->cath was done after this   CATARACT EXTRACTION W/ INTRAOCULAR LENS IMPLANT Right 2017   CORONARY STENT INTERVENTION N/A 06/27/2016   Procedure: Coronary Stent Intervention;  Surgeon: Leonie Man, MD;  Location: [place] [facility];  Service: Cardiovascular;  Laterality: N/A;   EYE SURGERY Right    Cataract   INGUINAL HERNIA REPAIR Bilateral 1990s   INTRAVASCULAR PRESSURE WIRE/FFR STUDY N/A 06/27/2016   Procedure: Intravascular Pressure Wire/FFR Study;  Surgeon: Leonie Man, MD;  Location: [place] [facility];  Service: Cardiovascular;  Laterality: N/A;   LEFT HEART CATH AND CORONARY ANGIOGRAPHY N/A 06/27/2016   Procedure: Left Heart Cath and Coronary Angiography;  Surgeon: Leonie Man, MD;  Location: [place] [facility];  Service: Cardiovascular;  Laterality: N/A;   REATTACHMENT HAND Left ~ 2001   w/carpal tunnel release   UMBILICAL HERNIA REPAIR  1990s   w/IHR    MEDICATIONS:  acetaminophen (TYLENOL) 500 MG tablet   Blood Glucose Monitoring Suppl (ACCU-CHEK GUIDE ME) w/Device KIT   bromocriptine (PARLODEL) 2.5 MG tablet   clopidogrel (PLAVIX) 75 MG tablet   cyclobenzaprine (FLEXERIL) 10 MG tablet   diazepam (VALIUM) 10 MG tablet   DULoxetine  (CYMBALTA) 30 MG capsule   EPINEPHrine 0.3 mg/0.3 mL IJ SOAJ injection   fenofibrate (TRICOR) 145 MG tablet   fluconazole (DIFLUCAN) 150 MG tablet   FLUoxetine (PROZAC) 40 MG capsule   fluticasone (FLONASE) 50 MCG/ACT nasal spray   gabapentin (NEURONTIN) 300 MG capsule   glucose blood (ACCU-CHEK GUIDE) test strip   hydrochlorothiazide (HYDRODIURIL) 12.5 MG tablet   HYDROcodone-acetaminophen (NORCO/VICODIN) 5-325 MG tablet   JARDIANCE 25 MG TABS tablet   lidocaine (LIDODERM) 5 %   losartan (COZAAR) 100 MG tablet   metFORMIN (GLUCOPHAGE-XR) 500 MG 24 hr tablet   nitroGLYCERIN (NITROSTAT) 0.4 MG SL tablet   nystatin cream (MYCOSTATIN)   ondansetron (ZOFRAN ODT) 4 MG disintegrating tablet   oxyCODONE (OXY IR/ROXICODONE) 5 MG immediate release tablet   OZEMPIC, 1 MG/DOSE, 4 MG/3ML SOPN   rosuvastatin (CRESTOR) 5 MG tablet   XARELTO 20 MG TABS tablet   No current facility-administered medications for this encounter.    Myra Gianotti, PA-C Surgical Short Stay/Anesthesiology [facility] Phone [phone] [facility] Phone [phone] 12/27/2020 11:52 PM

## 2020-12-27 NOTE — Telephone Encounter (Signed)
I need to know how many days his surgeon wants him off Xarelto?

## 2020-12-27 NOTE — Anesthesia Preprocedure Evaluation (Addendum)
Anesthesia Evaluation  Patient identified by MRN, date of birth, ID band Patient awake    Reviewed: Allergy & Precautions, NPO status , Patient's Chart, lab work & pertinent test results  Airway Mallampati: II  TM Distance: >3 FB Neck ROM: Full    Dental no notable dental hx. (+) Teeth Intact, Dental Advisory Given   Pulmonary sleep apnea and Continuous Positive Airway Pressure Ventilation , former smoker,    Pulmonary exam normal breath sounds clear to auscultation       Cardiovascular hypertension, Pt. on medications and Pt. on home beta blockers + CAD, + Cardiac Stents and + DVT  Normal cardiovascular exam+ Valvular Problems/Murmurs  Rhythm:Regular Rate:Normal     Neuro/Psych TIACVA, No Residual Symptoms    GI/Hepatic GERD  ,  Endo/Other  diabetes, Type 2  Renal/GU      Musculoskeletal  (+) Arthritis ,   Abdominal (+) + obese (BMI 33.34),   Peds  Hematology Lab Results      Component                Value               Date                      WBC                      5.7                 12/08/2020                HGB                      14.3                12/08/2020                HCT                      43.2                12/08/2020                MCV                      91.7                12/08/2020                PLT                      377.0               12/08/2020              Anesthesia Other Findings   Reproductive/Obstetrics                           Anesthesia Physical Anesthesia Plan  ASA: 3  Anesthesia Plan: General   Post-op Pain Management:    Induction: Intravenous  PONV Risk Score and Plan: 3 and Treatment may vary due to age or medical condition, Midazolam and Ondansetron  Airway Management Planned: Oral ETT  Additional Equipment: None  Intra-op Plan:   Post-operative Plan: Extubation in OR  Informed Consent: I have reviewed the patients History  and Physical, chart, labs and  discussed the procedure including the risks, benefits and alternatives for the proposed anesthesia with the patient or authorized representative who has indicated his/her understanding and acceptance.     Dental advisory given  Plan Discussed with: CRNA and Anesthesiologist  Anesthesia Plan Comments: (PAT note written 12/27/2020 by Shonna Chock, PA-C. )      Anesthesia Quick Evaluation

## 2020-12-27 NOTE — Telephone Encounter (Signed)
Spoke with Marchelle Folks at [facility] scheduling.  She needs details for xarelto and or bridging for lovenox. This was not explained in the surgical clearance risk assessment.   Sending to [facility] for recommendations.

## 2020-12-28 ENCOUNTER — Ambulatory Visit: Payer: Self-pay | Admitting: Orthopedic Surgery

## 2020-12-28 NOTE — Telephone Encounter (Signed)
ATC Marchelle Folks not able to reach will try again later

## 2020-12-28 NOTE — H&P (Signed)
Subjective:   Xavier [name] 55 yo male PMH: cardiac valve replacement, stents (on plavix), PE (on xarelto). OSA (uses CPAP), DM (last A1C<8). CC: Left leg pain ongoing for about 8 months. No improvement with meds including steroids, gabapentin, tylenol; no improvement with SNRB. We discussed risks and benefits of surgical intervention as well as alternative treatment options and the patient has elected to move fwd with surgery. He is scheduled for Left L4-5 gill decomp on 01/05/21 at [facility] with Dr. Rolena Infante.  Patient Active Problem List   Diagnosis Date Noted   Lumbar radiculopathy 10/15/2020   Shoulder pain, right 09/19/2018   Vitamin B 12 deficiency 01/10/2018   Type 2 diabetes mellitus without complication, without long-term current use of insulin (HCC)    Abnormal dobutamine stress echocardiogram    Chronic back pain 06/15/2016   Encounter for pre-operative respiratory clearance 06/15/2016   Obesity 03/29/2016   OSA (obstructive sleep apnea) 09/06/2015   Hypersomnia with sleep apnea 07/08/2015   RLS (restless legs syndrome) 07/08/2015   Cramp of both lower extremities 06/23/2015   Personal history of DVT (deep vein thrombosis) 06/23/2015   History of snoring 06/23/2015   Excessive somnolence disorder 06/23/2015   Obesity, morbid ([place]) 12/25/2014   Cellulitis 12/17/2013   Amputation, arm/hand, unilateral, traumatic, with complication ([place]) 53/29/9242   Depressive disorder 12/03/2013   Pulmonary embolism ([place]) 11/26/2013   Screening for prostate cancer 09/24/2012   Encounter for long-term (current) use of other medications 09/24/2012   HYPERCHOLESTEROLEMIA 07/17/2007   ANEMIA, PERNICIOUS 04/03/2007   Diabetes ([place]) 12/05/2006   Essential hypertension 12/05/2006   Coronary atherosclerosis 12/05/2006   Past Medical History:  Diagnosis Date   ANEMIA, PERNICIOUS 04/03/2007   Anxiety and depression    Arthritis    "left hand" (06/26/2016)   Chronic lower back pain    grd I degen  spondylolisthesis at L4-5 w/associated synovial cyst at facet at this level +underlying severe epidural lipomatosis L5-S1 up to L4-5--plan for surg w/Dr. Rolena Infante 12/2020.   Chronic renal insufficiency, stage 3 (moderate) ([place]) 2022   GFR around 60   Colon cancer screening 10/27/2016   Cologuard NEG-->rpt 3 yrs   Complication of anesthesia    slow to wake up   CORONARY ARTERY DISEASE 12/05/2006   DIABETES MELLITUS, TYPE II dx'd 2001   DVT (deep venous thrombosis) ([place]) 2016   BLE   GERD (gastroesophageal reflux disease)    History of balanitis    x 2. Holding off on circ as of 05/2020 urol f/u   History of blood transfusion 2001   "when I had my hand OR"   History of gout    History of kidney stones    HYPERCHOLESTEROLEMIA 07/17/2007   HYPERTENSION 12/05/2006   Learning disability    Migraine    "1-2/month" (06/26/2016)   Myocardial infarction ([place]) ~ 2003   Obesity, Class II, BMI 35-39.9    OSA on CPAP    Pt cannot recall setting clearly but thinks it is 5 cm h20.     PE (pulmonary embolism) 2016   Peripheral vascular disease ([place])    Simple renal cyst    X 2 on R kidney (one 5 cm and one 2 cm)   Stroke ([place]) ~ 2006   denies residual on 06/26/2016   TIA (transient ischemic attack) ?06/25/2016   VISUAL ACUITY, DECREASED, RIGHT EYE 02/19/2009    Past Surgical History:  Procedure Laterality Date   CARDIAC CATHETERIZATION  1990s; 2012; 06/2016   06/2016 distal LAD  DES stent.  EF 55-65%   CARDIOVASCULAR STRESS TEST  06/2016   Echo stress test->"inconclusive"-->cath was done after this   CATARACT EXTRACTION W/ INTRAOCULAR LENS IMPLANT Right 2017   CORONARY STENT INTERVENTION N/A 06/27/2016   Procedure: Coronary Stent Intervention;  Surgeon: Leonie Man, MD;  Location: [facility];  Service: Cardiovascular;  Laterality: N/A;   EYE SURGERY Right    Cataract   INGUINAL HERNIA REPAIR Bilateral 1990s   INTRAVASCULAR PRESSURE WIRE/FFR STUDY N/A 06/27/2016   Procedure:  Intravascular Pressure Wire/FFR Study;  Surgeon: Leonie Man, MD;  Location: [facility];  Service: Cardiovascular;  Laterality: N/A;   LEFT HEART CATH AND CORONARY ANGIOGRAPHY N/A 06/27/2016   Procedure: Left Heart Cath and Coronary Angiography;  Surgeon: Leonie Man, MD;  Location: [facility];  Service: Cardiovascular;  Laterality: N/A;   REATTACHMENT HAND Left ~ 2001   w/carpal tunnel release   UMBILICAL HERNIA REPAIR  1990s   w/IHR    Current Outpatient Medications  Medication Sig Dispense Refill Last Dose   acetaminophen (TYLENOL) 500 MG tablet Take 1,000 mg by mouth every 8 (eight) hours as needed for moderate pain.      Blood Glucose Monitoring Suppl (ACCU-CHEK GUIDE ME) w/Device KIT USE TO CHECK BLOOD SUGAR 1 TIME PER DAY. DX CODE: E11.9 1 kit 0    bromocriptine (PARLODEL) 2.5 MG tablet Take 1 tablet (2.5 mg total) by mouth daily. 45 tablet 1    clopidogrel (PLAVIX) 75 MG tablet TAKE 1 TABLET BY MOUTH EVERY DAY (Patient taking differently: Take 75 mg by mouth daily.) 90 tablet 3    cyclobenzaprine (FLEXERIL) 10 MG tablet Take 1 tablet (10 mg total) by mouth 3 (three) times daily as needed for muscle spasms. (Patient not taking: No sig reported) 15 tablet 0    diazepam (VALIUM) 10 MG tablet Take 10 mg by mouth 2 (two) times daily.      DULoxetine (CYMBALTA) 30 MG capsule Take 30 mg by mouth daily.      EPINEPHrine 0.3 mg/0.3 mL IJ SOAJ injection Inject 0.3 mLs (0.3 mg total) into the muscle as needed for anaphylaxis. 2 each 1    fenofibrate (TRICOR) 145 MG tablet TAKE 1 TABLET BY MOUTH EVERY DAY 90 tablet 1    fluconazole (DIFLUCAN) 150 MG tablet TAKE 1 TABLET BY MOUTH EVERY 7 DAYS (Patient not taking: No sig reported) 4 tablet 1    FLUoxetine (PROZAC) 40 MG capsule Take 40 mg by mouth daily.      fluticasone (FLONASE) 50 MCG/ACT nasal spray USE ONE SPRAY IN EACH NOSTRIL TWICE A DAY (Patient taking differently: Place 1 spray into both nostrils daily as needed for  allergies.) 16 g 2    gabapentin (NEURONTIN) 300 MG capsule Take 300 mg by mouth 2 (two) times daily.      glucose blood (ACCU-CHEK GUIDE) test strip check your blood sugar once a day 100 each 12    hydrochlorothiazide (HYDRODIURIL) 12.5 MG tablet TAKE 1 TABLET BY MOUTH EVERY DAY WITH THE LOSARTAN 90 tablet 1    HYDROcodone-acetaminophen (NORCO/VICODIN) 5-325 MG tablet Take 1 tablet by mouth every 6 (six) hours as needed for severe pain. (Patient not taking: No sig reported) 5 tablet 0    JARDIANCE 25 MG TABS tablet TAKE 1 TABLET BY MOUTH EVERY DAY 90 tablet 1    lidocaine (LIDODERM) 5 % Place 1 patch onto the skin daily. Remove & Discard patch within 12 hours  or as directed by MD (Patient not taking: No sig reported) 15 patch 0    losartan (COZAAR) 100 MG tablet TAKE 1/2 TABLET BY MOUTH EVERY DAY 45 tablet 1    metFORMIN (GLUCOPHAGE-XR) 500 MG 24 hr tablet TAKE 2 TABLETS BY MOUTH TWICE A DAY 360 tablet 0    nitroGLYCERIN (NITROSTAT) 0.4 MG SL tablet Place 1 tablet (0.4 mg total) under the tongue every 5 (five) minutes as needed for chest pain. 25 tablet 3    nystatin cream (MYCOSTATIN) Apply 1 application topically 2 (two) times daily. (Patient taking differently: Apply 1 application topically 2 (two) times daily as needed (yeast).) 30 g 1    ondansetron (ZOFRAN ODT) 4 MG disintegrating tablet Take 1 tablet (4 mg total) by mouth every 8 (eight) hours as needed for nausea or vomiting. (Patient not taking: No sig reported) 20 tablet 0    oxyCODONE (OXY IR/ROXICODONE) 5 MG immediate release tablet Take 5 mg by mouth every 6 (six) hours as needed for pain.      OZEMPIC, 1 MG/DOSE, 4 MG/3ML SOPN INJECT 1 MG INTO THE SKIN ONCE A WEEK. (Patient taking differently: Inject 1 mg as directed every Sunday.) 6 mL 3    rosuvastatin (CRESTOR) 5 MG tablet TAKE 1 TABLET BY MOUTH EVERY DAY 90 tablet 1    XARELTO 20 MG TABS tablet TAKE 1 TABLET BY MOUTH EVERY DAY BEFORE SUPPER 30 tablet 5    No current  facility-administered medications for this visit.   Allergies  Allergen Reactions   Actos [Pioglitazone] Other (See Comments)    Potential cause of DVT   Bee Venom Anaphylaxis   Klonopin [Clonazepam] Anaphylaxis   Invokana [Canagliflozin] Nausea Only    Nausea/dizzy/bad taste in mouth   Niacin Other (See Comments)    "makes his crazy"    Social History   Tobacco Use   Smoking status: Former    Packs/day: 0.10    Years: 3.00    Pack years: 0.30    Types: Cigarettes    Start date: 74    Quit date: 05/15/1984    Years since quitting: 36.6   Smokeless tobacco: Never  Substance Use Topics   Alcohol use: No    Family History  Problem Relation Age of Onset   COPD Mother    Lymphoma Father     Review of Systems Pertinent items are noted in HPI.  Objective:   Vitals: Ht: 5 ft 8 in 12/27/2020 08:08 am BP: 120/100 12/27/2020 08:14 am Pulse: 77 bpm 12/27/2020 08:14 am  Clinical exam: Shadrick is a pleasant individual, who appears younger than their stated age. He is alert and orientated 3. No shortness of breath, chest pain.  Heart: RRR, no rubs, murmers, or gallops  Lungs: CTAB  Abdomen is soft and non-tender, negative loss of bowel and bladder control, no rebound tenderness. BSx4.  Negative: skin lesions abrasions contusions  Peripheral pulses: 1+ dorsalis pedis/posterior tibialis pulses bilaterally. Compartment soft and nontender.  Gait pattern: Altered gait pattern due to severe radicular left L5 pain  Neuro: Positive numbness and dysesthesias in the left L5 dermatome. Positive straight leg raise test with reproduction of left radicular leg pain. 5/5 motor strength in the lower extremity bilaterally. Negative Babinski test, no clonus.  Muskuloskeletal: Mild to moderate low back pain with palpation and range of motion. No SI joint pain with direct palpation.  Lumbar x-rays demonstrate no collapse of the L4-5 disc space. No spondylolisthesis or scoliosis.  Lumbar  MRI:  completed on 10/01/20 was reviewed with the patient. It was completed at [facility]; I have independently reviewed the images as well as the radiology report. Mild to moderate ventral displacement of the L5 nerve root at the L4-5 disc space level due to a synovial cyst on the left side at L4-5. Lateral recess stenosis causing L5 nerve root compression. Simple cyst in the posterior lower pole of the right kidney. No other significant pathology is noted.   Assessment:   Rafi is a very pleasant 55 year old gentleman with significant radicular left leg pain. Imaging studies demonstrate a synovial cyst at L4-5 but no significant structural abnormality. Given the absence of instability, and predominance of radicular leg pain my recommendation is a lumbar decompression/Gill procedure. This would allow resection of the facet to decompress the cyst and alleviate the nerve compression. This would also reduce the risk of recurrence. I have gone over the risks and benefits of surgery with the patient and his wife and all of their questions were addressed. Risks and benefits of decompression/discectomy: Infection, bleeding, death, stroke, paralysis, ongoing or worse pain, need for additional surgery, leak of spinal fluid, adjacent segment degeneration requiring additional surgery, post-operative hematoma formation that can result in neurological compromise and the need for urgent/emergent re-operation. Loss in bowel and bladder control. Injury to major vessels that could result in the need for urgent abdominal surgery to stop bleeding. Risk of deep venous thrombosis (DVT) and the need for additional treatment. Recurrent disc herniation resulting in the need for revision surgery, which could include fusion surgery (utilizing instrumentation such as pedicle screws and intervertebral cages). Additional risk: If instrumentation is used there is a risk of migration, or breakage of that hardware that could require  additional surgery. The patient has multiple medical issues including diabetes, coronary artery disease status post 2 stents on plavix, history of blood clots on Xarelto, sleep apnea requiring home CPAP machine (which he is currently non-compliant), and diabetes. He may require a stepdown unit postop.  Plan:   We have obtained preoperative medical clearance From the patient's cardiologist. It is okay for him to hold the Plavix 5 days prior to surgery. We will plan to restart this on postop day 2.  We have obtained preoperative medical clearance from the patient's pulmonologist. They are recommending a Lovenox bridge if we have the patient hold his Xarelto for greater than 3 days. I would like the patient to be off of Xarelto for 3 days prior to surgery as well as postop day 1 therefore we will plan for him to have his last Xarelto dose on Saturday, September 20 and we will have him do Lovenox on Sunday the 21st and Monday the 22nd. We will hold all anticoagulation Tuesday the 23rd, the day prior to surgery as well as the day of surgery and postop day 1. We will resume his Xarelto on postop day 2 which is Friday the 26th and monitor him for a few hours after starting anticoagulation prior to discharge. I have a call to the patient's pulmonologist to confirm this is an adequate plan in confirm the dosage of the Lovenox.  We have also obtained clearance from the patient's primary care provider.  We are still awaiting clearance from the patient's endocrinologist but the patient reports his last A1c was less than 8.  Patient is scheduled for his LSO brace fitting today.  We have also discussed the post-operative recovery period to include: bathing/showering restrictions, wound healing, activity (and driving) restrictions, medications/pain mangement.  We have also discussed post-operative redflags to include: signs and symptoms of postoperative infection, DVT/PE. Given that this patient is on Xarelto and  Plavix which we will be restarting on postop day 2 he is at a slightly increased risk for bleeding and therefore cauda equina syndrome due to a hematoma which would require urgent or emergent operation to release the pressure on the cauda equina. I have discussed this risk with the patient and he expressed understanding of it. We also discussed signs and symptoms of cauda equina syndrome including loss of control of bladder or bowel function saddle anesthesia and paresthesia.  Patient was given a copy of discharge instructions at today's office visit. These were discussed with him as well as his wife. All the questions were invited and answered  Follow-up: 2 weeks postop

## 2020-12-28 NOTE — H&P (Deleted)
  The note originally documented on this encounter has been moved the the encounter in which it belongs.  

## 2020-12-29 ENCOUNTER — Other Ambulatory Visit: Payer: Self-pay

## 2020-12-29 ENCOUNTER — Ambulatory Visit (INDEPENDENT_AMBULATORY_CARE_PROVIDER_SITE_OTHER): Payer: Medicare Other | Admitting: Endocrinology

## 2020-12-29 VITALS — BP 120/80 | HR 69 | Ht 72.0 in | Wt 245.8 lb

## 2020-12-29 DIAGNOSIS — I2510 Atherosclerotic heart disease of native coronary artery without angina pectoris: Secondary | ICD-10-CM | POA: Diagnosis not present

## 2020-12-29 DIAGNOSIS — E538 Deficiency of other specified B group vitamins: Secondary | ICD-10-CM

## 2020-12-29 DIAGNOSIS — E119 Type 2 diabetes mellitus without complications: Secondary | ICD-10-CM | POA: Diagnosis not present

## 2020-12-29 DIAGNOSIS — E7800 Pure hypercholesterolemia, unspecified: Secondary | ICD-10-CM

## 2020-12-29 LAB — POCT GLYCOSYLATED HEMOGLOBIN (HGB A1C): Hemoglobin A1C: 7.4 % — AB (ref 4.0–5.6)

## 2020-12-29 MED ORDER — OZEMPIC (2 MG/DOSE) 8 MG/3ML ~~LOC~~ SOPN: 2.0000 mg | PEN_INJECTOR | SUBCUTANEOUS | 3 refills | Status: DC

## 2020-12-29 NOTE — Patient Instructions (Addendum)
I have sent a prescription to your pharmacy, to double the Ozempic.  Please continue the same other medications.   Blood tests are requested for you today.  We'll let you know about the results.  check your blood sugar once a day.  vary the time of day when you check, between before the 3 meals, and at bedtime.  also check if you have symptoms of your blood sugar being too high or too low.  please keep a record of the readings and bring it to your next appointment here (or you can bring the meter itself).  You can write it on any piece of paper.  please call us sooner if your blood sugar goes below 70, or if you have a lot of readings over 200.   Please come back for a follow-up appointment in 6 months.

## 2020-12-29 NOTE — Progress Notes (Signed)
   Subjective:    Patient ID: Xavier [name]., male    DOB: 05-21-1965, 55 y.o.   MRN: [id number]  HPI Pt returns for f/u of diabetes mellitus:  DM type: 2 Dx'ed: 2005 Complications: CAD, CRI, and PN.   Therapy: Ozempic and 3 oral meds.   DKA: never Severe hypoglycemia: never.   Pancreatitis: never.  Other: he did not tolerate pioglitizone (edema); he has never taken insulin, except in the hospital.   Interval history: no cbg record, but states cbg's are in the low-100's.  pt states he feels well in general.  He takes meds as rx'ed.   He has not recently taken B-12 shots.  Review of Systems Denies n/v/HB    Objective:   Physical Exam Pulses: dorsalis pedis intact bilat.   MSK: no deformity of the feet CV: trace bilat leg edema.   Skin:  no ulcer on the feet.  normal color and temp on the feet.   Neuro: sensation is intact to touch on the feet.    Lab Results  Component Value Date   CREATININE 1.26 12/08/2020   BUN 21 12/08/2020   NA 135 12/08/2020   K 4.8 12/08/2020   CL 97 12/08/2020   CO2 27 12/08/2020   Lab Results  Component Value Date   TSH 2.73 10/10/2017    Lab Results  Component Value Date   HGBA1C 7.4 (A) 12/29/2020      Assessment & Plan:  Type 2 DM: uncontrolled His surgical risk is low and outweighed by the potential benefit of the surgery.  he is therefore medically cleared, from the standpoint of DM.  B-12 def: recheck today.  Patient Instructions  I have sent a prescription to your pharmacy, to double the Ozempic.  Please continue the same other medications.   Blood tests are requested for you today.  We'll let you know about the results.  check your blood sugar once a day.  vary the time of day when you check, between before the 3 meals, and at bedtime.  also check if you have symptoms of your blood sugar being too high or too low.  please keep a record of the readings and bring it to your next appointment here (or you can bring the meter  itself).  You can write it on any piece of paper.  please call us sooner if your blood sugar goes below 70, or if you have a lot of readings over 200.   Please come back for a follow-up appointment in 6 months.

## 2020-12-30 NOTE — Telephone Encounter (Signed)
I have called Marchelle Folks at [facility] and she is aware of SG and VS recs for the lovenox.  She is going to dose him for this for 2 days.  She is aware that we will need to have cardiology start to follow him for him xarelto.  Nothing further is needed.

## 2020-12-30 NOTE — Telephone Encounter (Signed)
SG and BW please advise on the xarelto dosing.  Thanks

## 2020-12-30 NOTE — Telephone Encounter (Signed)
Marchelle Folks is calling back stating no one returned her call.  Would like to know what dosage is lovenex.  Marchelle Folks can be reached at [phone] is her cell phone.

## 2020-12-30 NOTE — Telephone Encounter (Signed)
Spoke with Xavier [name] at [facility]. Xavier [name] is under the understanding Xavier [name] can not be off Xarelto more then 3 days. Xavier [name]'s bridge plan for Xavier [name] is to stop Xarelto on Saturday 01/01/21, give Xavier [name] Lovenox on Sunday 01/02/21 and Monday 01/03/21 then Xavier [name] would have no anticoagulation on Tuesday (day of surgery) 01/04/21, Wednesday 01/05/21 and Thursday 01/06/21 then restart Noel Christmas on Friday 01/07/21. Xavier [name] is stating they can give the Lovenox to Xavier [name] but need to know what dose to use. Dr. Craige Cotta please advise. Thank you

## 2020-12-30 NOTE — Telephone Encounter (Signed)
I last saw him in July 2019.  At that time my note documents that cardiology was managing his xarelto.  It is not clear to me how or when this got switched to pulmonary managing this, especially since his follow up with pulmonary has been sporadic over the past several years.  For now the dose of lovenox is weight based 1 mg/kg every 12 hours and should be 40 mg q12hrs.  It looks like he has seen Beth in 2022 and Maralyn Sago in 2020 since his last visit with me in July 2019.  Will route message to them to see if they can determine how management of xarelto got switched to pulmonary, or whether this needs to revert back to cardiology to manage.

## 2021-01-03 ENCOUNTER — Other Ambulatory Visit: Payer: Self-pay | Admitting: Orthopedic Surgery

## 2021-01-03 LAB — SARS CORONAVIRUS 2 (TAT 6-24 HRS): SARS Coronavirus 2: NEGATIVE

## 2021-01-05 ENCOUNTER — Inpatient Hospital Stay (HOSPITAL_COMMUNITY)
Admission: RE | Disposition: A | Discharge status: Home / Self Care | Payer: Self-pay | Source: Home / Self Care | Attending: Orthopedic Surgery

## 2021-01-05 ENCOUNTER — Inpatient Hospital Stay (HOSPITAL_COMMUNITY): Payer: Medicare Other | Admitting: Vascular Surgery

## 2021-01-05 ENCOUNTER — Inpatient Hospital Stay (HOSPITAL_COMMUNITY): Payer: Medicare Other

## 2021-01-05 ENCOUNTER — Encounter (HOSPITAL_COMMUNITY): Payer: Self-pay | Admitting: Orthopedic Surgery

## 2021-01-05 ENCOUNTER — Inpatient Hospital Stay (HOSPITAL_COMMUNITY)
Admission: RE | Admit: 2021-01-05 | Discharge: 2021-01-08 | DRG: 517 | Disposition: A | Discharge status: Home / Self Care | Payer: Medicare Other | Attending: Orthopedic Surgery | Admitting: Orthopedic Surgery

## 2021-01-05 DIAGNOSIS — I129 Hypertensive chronic kidney disease with stage 1 through stage 4 chronic kidney disease, or unspecified chronic kidney disease: Secondary | ICD-10-CM | POA: Diagnosis present

## 2021-01-05 DIAGNOSIS — N183 Chronic kidney disease, stage 3 unspecified: Secondary | ICD-10-CM | POA: Diagnosis present

## 2021-01-05 DIAGNOSIS — Z888 Allergy status to other drugs, medicaments and biological substances status: Secondary | ICD-10-CM

## 2021-01-05 DIAGNOSIS — Z7902 Long term (current) use of antithrombotics/antiplatelets: Secondary | ICD-10-CM

## 2021-01-05 DIAGNOSIS — M4306 Spondylolysis, lumbar region: Secondary | ICD-10-CM | POA: Diagnosis present

## 2021-01-05 DIAGNOSIS — Z8673 Personal history of transient ischemic attack (TIA), and cerebral infarction without residual deficits: Secondary | ICD-10-CM

## 2021-01-05 DIAGNOSIS — M7138 Other bursal cyst, other site: Secondary | ICD-10-CM | POA: Diagnosis present

## 2021-01-05 DIAGNOSIS — Z7901 Long term (current) use of anticoagulants: Secondary | ICD-10-CM

## 2021-01-05 DIAGNOSIS — E1169 Type 2 diabetes mellitus with other specified complication: Secondary | ICD-10-CM

## 2021-01-05 DIAGNOSIS — Z419 Encounter for procedure for purposes other than remedying health state, unspecified: Secondary | ICD-10-CM

## 2021-01-05 DIAGNOSIS — E7800 Pure hypercholesterolemia, unspecified: Secondary | ICD-10-CM | POA: Diagnosis present

## 2021-01-05 DIAGNOSIS — Z86718 Personal history of other venous thrombosis and embolism: Secondary | ICD-10-CM | POA: Diagnosis not present

## 2021-01-05 DIAGNOSIS — M48061 Spinal stenosis, lumbar region without neurogenic claudication: Secondary | ICD-10-CM | POA: Diagnosis present

## 2021-01-05 DIAGNOSIS — G4733 Obstructive sleep apnea (adult) (pediatric): Secondary | ICD-10-CM | POA: Diagnosis present

## 2021-01-05 DIAGNOSIS — Z9889 Other specified postprocedural states: Secondary | ICD-10-CM | POA: Diagnosis not present

## 2021-01-05 DIAGNOSIS — E669 Obesity, unspecified: Secondary | ICD-10-CM | POA: Diagnosis present

## 2021-01-05 DIAGNOSIS — Z91013 Allergy to seafood: Secondary | ICD-10-CM

## 2021-01-05 DIAGNOSIS — E118 Type 2 diabetes mellitus with unspecified complications: Secondary | ICD-10-CM | POA: Diagnosis not present

## 2021-01-05 DIAGNOSIS — Z87891 Personal history of nicotine dependence: Secondary | ICD-10-CM

## 2021-01-05 DIAGNOSIS — M5416 Radiculopathy, lumbar region: Secondary | ICD-10-CM | POA: Diagnosis present

## 2021-01-05 DIAGNOSIS — Z952 Presence of prosthetic heart valve: Secondary | ICD-10-CM | POA: Diagnosis not present

## 2021-01-05 DIAGNOSIS — M109 Gout, unspecified: Secondary | ICD-10-CM | POA: Diagnosis present

## 2021-01-05 DIAGNOSIS — E1151 Type 2 diabetes mellitus with diabetic peripheral angiopathy without gangrene: Secondary | ICD-10-CM | POA: Diagnosis present

## 2021-01-05 DIAGNOSIS — K219 Gastro-esophageal reflux disease without esophagitis: Secondary | ICD-10-CM | POA: Diagnosis present

## 2021-01-05 DIAGNOSIS — I2510 Atherosclerotic heart disease of native coronary artery without angina pectoris: Secondary | ICD-10-CM | POA: Diagnosis present

## 2021-01-05 DIAGNOSIS — G8929 Other chronic pain: Secondary | ICD-10-CM | POA: Diagnosis present

## 2021-01-05 DIAGNOSIS — Z955 Presence of coronary angioplasty implant and graft: Secondary | ICD-10-CM

## 2021-01-05 DIAGNOSIS — E1165 Type 2 diabetes mellitus with hyperglycemia: Secondary | ICD-10-CM | POA: Diagnosis present

## 2021-01-05 DIAGNOSIS — F419 Anxiety disorder, unspecified: Secondary | ICD-10-CM

## 2021-01-05 DIAGNOSIS — F32A Depression, unspecified: Secondary | ICD-10-CM | POA: Diagnosis present

## 2021-01-05 DIAGNOSIS — I252 Old myocardial infarction: Secondary | ICD-10-CM

## 2021-01-05 DIAGNOSIS — Z6833 Body mass index (BMI) 33.0-33.9, adult: Secondary | ICD-10-CM | POA: Diagnosis not present

## 2021-01-05 DIAGNOSIS — Z79899 Other long term (current) drug therapy: Secondary | ICD-10-CM

## 2021-01-05 DIAGNOSIS — I10 Essential (primary) hypertension: Secondary | ICD-10-CM | POA: Diagnosis not present

## 2021-01-05 DIAGNOSIS — Z86711 Personal history of pulmonary embolism: Secondary | ICD-10-CM

## 2021-01-05 DIAGNOSIS — E1122 Type 2 diabetes mellitus with diabetic chronic kidney disease: Secondary | ICD-10-CM | POA: Diagnosis present

## 2021-01-05 DIAGNOSIS — Z7984 Long term (current) use of oral hypoglycemic drugs: Secondary | ICD-10-CM

## 2021-01-05 HISTORY — PX: LUMBAR LAMINECTOMY/DECOMPRESSION MICRODISCECTOMY: SHX5026

## 2021-01-05 LAB — HEMOGLOBIN A1C
Hgb A1c MFr Bld: 7.5 % — ABNORMAL HIGH (ref 4.8–5.6)
Mean Plasma Glucose: 168.55 mg/dL

## 2021-01-05 LAB — BASIC METABOLIC PANEL
Anion gap: 12 (ref 5–15)
BUN: 17 mg/dL (ref 6–20)
CO2: 23 mmol/L (ref 22–32)
Calcium: 9.3 mg/dL (ref 8.9–10.3)
Chloride: 100 mmol/L (ref 98–111)
Creatinine, Ser: 1.21 mg/dL (ref 0.61–1.24)
GFR, Estimated: >60 mL/min (ref >60)
Glucose, Bld: 118 mg/dL — ABNORMAL HIGH (ref 70–99)
Potassium: 3.5 mmol/L (ref 3.5–5.1)
Sodium: 135 mmol/L (ref 135–145)

## 2021-01-05 LAB — PROTIME-INR
INR: 0.9 (ref 0.8–1.2)
Prothrombin Time: 12.4 seconds (ref 11.4–15.2)

## 2021-01-05 LAB — GLUCOSE, CAPILLARY
Glucose-Capillary: 125 mg/dL — ABNORMAL HIGH (ref 70–99)
Glucose-Capillary: 108 mg/dL — ABNORMAL HIGH (ref 70–99)
Glucose-Capillary: 134 mg/dL — ABNORMAL HIGH (ref 70–99)

## 2021-01-05 LAB — CBC
HCT: 43 % (ref 39.0–52.0)
Hemoglobin: 14.3 g/dL (ref 13.0–17.0)
MCH: 30.4 pg (ref 26.0–34.0)
MCHC: 33.3 g/dL (ref 30.0–36.0)
MCV: 91.5 fL (ref 80.0–100.0)
Platelets: 389 10*3/uL (ref 150–400)
RBC: 4.7 MIL/uL (ref 4.22–5.81)
RDW: 12.9 % (ref 11.5–15.5)
WBC: 6 10*3/uL (ref 4.0–10.5)
nRBC: 0 % (ref 0.0–0.2)

## 2021-01-05 LAB — APTT: aPTT: 29 seconds (ref 24–36)

## 2021-01-05 SURGERY — LUMBAR LAMINECTOMY/DECOMPRESSION MICRODISCECTOMY 1 LEVEL
Anesthesia: General | Laterality: Left

## 2021-01-05 MED ORDER — BUPIVACAINE-EPINEPHRINE (PF) 0.25% -1:200000 IJ SOLN
INTRAMUSCULAR | Status: AC
  Filled 2021-01-05: qty 30

## 2021-01-05 MED ORDER — ACETAMINOPHEN 10 MG/ML IV SOLN: 1000.0000 mg | Freq: Once | INTRAVENOUS | Status: DC | PRN

## 2021-01-05 MED ORDER — ROCURONIUM BROMIDE 10 MG/ML (PF) SYRINGE
PREFILLED_SYRINGE | INTRAVENOUS | Status: DC | PRN
  Administered 2021-01-05: 80 mg via INTRAVENOUS
  Administered 2021-01-05: 20 mg via INTRAVENOUS

## 2021-01-05 MED ORDER — CEFAZOLIN SODIUM-DEXTROSE 1-4 GM/50ML-% IV SOLN
1.0000 g | Freq: Three times a day (TID) | INTRAVENOUS | Status: AC
  Administered 2021-01-05 – 2021-01-06 (×2): 1 g via INTRAVENOUS
  Filled 2021-01-05 (×2): qty 50

## 2021-01-05 MED ORDER — METHOCARBAMOL 1000 MG/10ML IJ SOLN
500.0000 mg | Freq: Four times a day (QID) | INTRAVENOUS | Status: DC | PRN
  Filled 2021-01-05: qty 5

## 2021-01-05 MED ORDER — BUPIVACAINE-EPINEPHRINE 0.25% -1:200000 IJ SOLN
INTRAMUSCULAR | Status: DC | PRN
  Administered 2021-01-05: 10 mL

## 2021-01-05 MED ORDER — OXYCODONE-ACETAMINOPHEN 10-325 MG PO TABS: 1.0000 | ORAL_TABLET | Freq: Four times a day (QID) | ORAL | 0 refills | Status: AC | PRN

## 2021-01-05 MED ORDER — NITROGLYCERIN 0.4 MG SL SUBL
0.4000 mg | SUBLINGUAL_TABLET | SUBLINGUAL | Status: DC | PRN
  Filled 2021-01-05: qty 1

## 2021-01-05 MED ORDER — POLYETHYLENE GLYCOL 3350 17 G PO PACK
17.0000 g | PACK | Freq: Every day | ORAL | Status: DC | PRN
  Filled 2021-01-05: qty 1

## 2021-01-05 MED ORDER — ACETAMINOPHEN 10 MG/ML IV SOLN
INTRAVENOUS | Status: DC | PRN
  Administered 2021-01-05: 1000 mg via INTRAVENOUS

## 2021-01-05 MED ORDER — EPHEDRINE 5 MG/ML INJ
INTRAVENOUS | Status: AC
  Filled 2021-01-05: qty 5

## 2021-01-05 MED ORDER — CHLORHEXIDINE GLUCONATE 0.12 % MT SOLN
15.0000 mL | OROMUCOSAL | Status: AC
  Administered 2021-01-05: 15 mL via OROMUCOSAL
  Filled 2021-01-05 (×2): qty 15

## 2021-01-05 MED ORDER — DULOXETINE HCL 30 MG PO CPEP
30.0000 mg | ORAL_CAPSULE | Freq: Every day | ORAL | Status: DC
  Administered 2021-01-06 – 2021-01-08 (×3): 30 mg via ORAL
  Filled 2021-01-05 (×4): qty 1

## 2021-01-05 MED ORDER — OXYCODONE HCL 5 MG PO TABS
ORAL_TABLET | ORAL | Status: AC
  Administered 2021-01-06: 10 mg via ORAL
  Filled 2021-01-05: qty 1

## 2021-01-05 MED ORDER — HYDROMORPHONE HCL 1 MG/ML IJ SOLN
1.0000 mg | INTRAMUSCULAR | Status: AC | PRN
  Administered 2021-01-06: 1 mg via INTRAVENOUS
  Filled 2021-01-05 (×2): qty 1

## 2021-01-05 MED ORDER — MIDAZOLAM HCL 2 MG/2ML IJ SOLN
INTRAMUSCULAR | Status: AC
  Filled 2021-01-05: qty 2

## 2021-01-05 MED ORDER — LACTATED RINGERS IV SOLN: INTRAVENOUS | Status: DC

## 2021-01-05 MED ORDER — ONDANSETRON HCL 4 MG/2ML IJ SOLN: 4.0000 mg | Freq: Once | INTRAMUSCULAR | Status: DC | PRN

## 2021-01-05 MED ORDER — GABAPENTIN 300 MG PO CAPS
300.0000 mg | ORAL_CAPSULE | Freq: Two times a day (BID) | ORAL | Status: DC
  Administered 2021-01-05 – 2021-01-08 (×6): 300 mg via ORAL
  Filled 2021-01-05 (×7): qty 1

## 2021-01-05 MED ORDER — MIDAZOLAM HCL 2 MG/2ML IJ SOLN
INTRAMUSCULAR | Status: DC | PRN
  Administered 2021-01-05: 2 mg via INTRAVENOUS

## 2021-01-05 MED ORDER — PROPOFOL 10 MG/ML IV BOLUS
INTRAVENOUS | Status: DC | PRN
  Administered 2021-01-05: 150 mg via INTRAVENOUS

## 2021-01-05 MED ORDER — ONDANSETRON HCL 4 MG PO TABS: 4.0000 mg | ORAL_TABLET | Freq: Three times a day (TID) | ORAL | 0 refills | Status: AC | PRN

## 2021-01-05 MED ORDER — HYDROMORPHONE HCL 1 MG/ML IJ SOLN
0.2500 mg | INTRAMUSCULAR | Status: DC | PRN
  Administered 2021-01-05 (×2): 0.5 mg via INTRAVENOUS

## 2021-01-05 MED ORDER — HYDROMORPHONE HCL 1 MG/ML IJ SOLN
INTRAMUSCULAR | Status: AC
  Administered 2021-01-06: 1 mg via INTRAVENOUS
  Filled 2021-01-05: qty 1

## 2021-01-05 MED ORDER — 0.9 % SODIUM CHLORIDE (POUR BTL) OPTIME
TOPICAL | Status: DC | PRN
  Administered 2021-01-05: 1000 mL

## 2021-01-05 MED ORDER — PROPOFOL 10 MG/ML IV BOLUS
INTRAVENOUS | Status: AC
  Filled 2021-01-05: qty 20

## 2021-01-05 MED ORDER — ACETAMINOPHEN 10 MG/ML IV SOLN
INTRAVENOUS | Status: AC
  Filled 2021-01-05: qty 100

## 2021-01-05 MED ORDER — MENTHOL 3 MG MT LOZG
1.0000 | LOZENGE | OROMUCOSAL | Status: DC | PRN
  Filled 2021-01-05: qty 9

## 2021-01-05 MED ORDER — ROSUVASTATIN CALCIUM 5 MG PO TABS
5.0000 mg | ORAL_TABLET | Freq: Every day | ORAL | Status: DC
  Administered 2021-01-06 – 2021-01-08 (×3): 5 mg via ORAL
  Filled 2021-01-05 (×4): qty 1

## 2021-01-05 MED ORDER — PHENOL 1.4 % MT LIQD
1.0000 | OROMUCOSAL | Status: DC | PRN
  Filled 2021-01-05: qty 177

## 2021-01-05 MED ORDER — SODIUM CHLORIDE 0.9 % IV SOLN: 250.0000 mL | INTRAVENOUS | Status: DC

## 2021-01-05 MED ORDER — AMISULPRIDE (ANTIEMETIC) 5 MG/2ML IV SOLN: 10.0000 mg | Freq: Once | INTRAVENOUS | Status: DC | PRN

## 2021-01-05 MED ORDER — EMPAGLIFLOZIN 25 MG PO TABS
25.0000 mg | ORAL_TABLET | Freq: Every day | ORAL | Status: DC
  Administered 2021-01-06 – 2021-01-08 (×3): 25 mg via ORAL
  Filled 2021-01-05 (×4): qty 1

## 2021-01-05 MED ORDER — METHOCARBAMOL 500 MG PO TABS
500.0000 mg | ORAL_TABLET | Freq: Four times a day (QID) | ORAL | Status: DC | PRN
  Administered 2021-01-06 – 2021-01-08 (×5): 500 mg via ORAL
  Filled 2021-01-05 (×7): qty 1

## 2021-01-05 MED ORDER — INSULIN ASPART 100 UNIT/ML IJ SOLN: 0.0000 [IU] | Freq: Every day | INTRAMUSCULAR | Status: DC

## 2021-01-05 MED ORDER — FENTANYL CITRATE (PF) 250 MCG/5ML IJ SOLN
INTRAMUSCULAR | Status: AC
  Filled 2021-01-05: qty 5

## 2021-01-05 MED ORDER — EPHEDRINE SULFATE-NACL 50-0.9 MG/10ML-% IV SOSY
PREFILLED_SYRINGE | INTRAVENOUS | Status: DC | PRN
  Administered 2021-01-05: 5 mg via INTRAVENOUS
  Administered 2021-01-05 (×2): 10 mg via INTRAVENOUS
  Administered 2021-01-05: 5 mg via INTRAVENOUS
  Administered 2021-01-05: 10 mg via INTRAVENOUS
  Administered 2021-01-05 (×2): 5 mg via INTRAVENOUS

## 2021-01-05 MED ORDER — ROCURONIUM BROMIDE 10 MG/ML (PF) SYRINGE
PREFILLED_SYRINGE | INTRAVENOUS | Status: AC
  Filled 2021-01-05: qty 10

## 2021-01-05 MED ORDER — LIDOCAINE 2% (20 MG/ML) 5 ML SYRINGE
INTRAMUSCULAR | Status: AC
  Filled 2021-01-05: qty 5

## 2021-01-05 MED ORDER — ONDANSETRON HCL 4 MG/2ML IJ SOLN
INTRAMUSCULAR | Status: AC
  Filled 2021-01-05: qty 2

## 2021-01-05 MED ORDER — ONDANSETRON HCL 4 MG/2ML IJ SOLN
4.0000 mg | Freq: Four times a day (QID) | INTRAMUSCULAR | Status: DC | PRN
  Filled 2021-01-05: qty 2

## 2021-01-05 MED ORDER — LIDOCAINE 2% (20 MG/ML) 5 ML SYRINGE
INTRAMUSCULAR | Status: DC | PRN
  Administered 2021-01-05: 100 mg via INTRAVENOUS

## 2021-01-05 MED ORDER — ALBUMIN HUMAN 5 % IV SOLN: INTRAVENOUS | Status: DC | PRN

## 2021-01-05 MED ORDER — BROMOCRIPTINE MESYLATE 2.5 MG PO TABS
2.5000 mg | ORAL_TABLET | Freq: Every day | ORAL | Status: DC
  Administered 2021-01-06 – 2021-01-08 (×3): 2.5 mg via ORAL
  Filled 2021-01-05 (×3): qty 1

## 2021-01-05 MED ORDER — SUGAMMADEX SODIUM 200 MG/2ML IV SOLN
INTRAVENOUS | Status: DC | PRN
  Administered 2021-01-05: 200 mg via INTRAVENOUS

## 2021-01-05 MED ORDER — ACETAMINOPHEN 325 MG PO TABS
650.0000 mg | ORAL_TABLET | ORAL | Status: DC | PRN
  Administered 2021-01-06 – 2021-01-08 (×4): 650 mg via ORAL
  Filled 2021-01-05 (×6): qty 2

## 2021-01-05 MED ORDER — SODIUM CHLORIDE 0.9% FLUSH
3.0000 mL | Freq: Two times a day (BID) | INTRAVENOUS | Status: DC
  Administered 2021-01-06 – 2021-01-07 (×3): 3 mL via INTRAVENOUS

## 2021-01-05 MED ORDER — ONDANSETRON HCL 4 MG PO TABS
4.0000 mg | ORAL_TABLET | Freq: Four times a day (QID) | ORAL | Status: DC | PRN
  Filled 2021-01-05: qty 1

## 2021-01-05 MED ORDER — FENTANYL CITRATE (PF) 250 MCG/5ML IJ SOLN
INTRAMUSCULAR | Status: DC | PRN
  Administered 2021-01-05: 150 ug via INTRAVENOUS

## 2021-01-05 MED ORDER — FLUOXETINE HCL 20 MG PO CAPS
40.0000 mg | ORAL_CAPSULE | Freq: Every day | ORAL | Status: DC
  Administered 2021-01-06 – 2021-01-08 (×3): 40 mg via ORAL
  Filled 2021-01-05 (×4): qty 2

## 2021-01-05 MED ORDER — OXYCODONE HCL 5 MG PO TABS
10.0000 mg | ORAL_TABLET | ORAL | Status: DC | PRN
  Administered 2021-01-05 – 2021-01-08 (×11): 10 mg via ORAL
  Filled 2021-01-05 (×12): qty 2

## 2021-01-05 MED ORDER — DOCUSATE SODIUM 100 MG PO CAPS
100.0000 mg | ORAL_CAPSULE | Freq: Two times a day (BID) | ORAL | Status: DC
  Administered 2021-01-05 – 2021-01-08 (×6): 100 mg via ORAL
  Filled 2021-01-05 (×7): qty 1

## 2021-01-05 MED ORDER — METHYLPREDNISOLONE ACETATE 40 MG/ML IJ SUSP
INTRAMUSCULAR | Status: AC
  Filled 2021-01-05: qty 1

## 2021-01-05 MED ORDER — OXYCODONE HCL 5 MG PO TABS: 5.0000 mg | ORAL_TABLET | ORAL | Status: DC | PRN

## 2021-01-05 MED ORDER — THROMBIN 20000 UNITS EX KIT
PACK | CUTANEOUS | Status: AC
  Filled 2021-01-05: qty 1

## 2021-01-05 MED ORDER — DEXMEDETOMIDINE HCL IN NACL 200 MCG/50ML IV SOLN
INTRAVENOUS | Status: DC | PRN
  Administered 2021-01-05: 16 ug via INTRAVENOUS

## 2021-01-05 MED ORDER — FENOFIBRATE 160 MG PO TABS
160.0000 mg | ORAL_TABLET | Freq: Every day | ORAL | Status: DC
  Administered 2021-01-06 – 2021-01-08 (×3): 160 mg via ORAL
  Filled 2021-01-05 (×4): qty 1

## 2021-01-05 MED ORDER — OXYCODONE HCL 5 MG/5ML PO SOLN
5.0000 mg | Freq: Once | ORAL | Status: AC | PRN
Start: 2021-01-05 — End: 2021-01-05

## 2021-01-05 MED ORDER — CEFAZOLIN SODIUM-DEXTROSE 2-4 GM/100ML-% IV SOLN
2.0000 g | INTRAVENOUS | Status: AC
  Administered 2021-01-05: 2 g via INTRAVENOUS
  Filled 2021-01-05: qty 100

## 2021-01-05 MED ORDER — ONDANSETRON HCL 4 MG/2ML IJ SOLN
INTRAMUSCULAR | Status: DC | PRN
  Administered 2021-01-05: 4 mg via INTRAVENOUS

## 2021-01-05 MED ORDER — HYDROCHLOROTHIAZIDE 25 MG PO TABS
12.5000 mg | ORAL_TABLET | Freq: Every day | ORAL | Status: DC
  Filled 2021-01-05: qty 0.5

## 2021-01-05 MED ORDER — POTASSIUM CHLORIDE CRYS ER 20 MEQ PO TBCR
20.0000 meq | EXTENDED_RELEASE_TABLET | ORAL | Status: AC
  Administered 2021-01-05: 20 meq via ORAL
  Filled 2021-01-05: qty 1

## 2021-01-05 MED ORDER — SODIUM CHLORIDE 0.9% FLUSH: 3.0000 mL | INTRAVENOUS | Status: DC | PRN

## 2021-01-05 MED ORDER — INSULIN ASPART 100 UNIT/ML IJ SOLN
0.0000 [IU] | Freq: Three times a day (TID) | INTRAMUSCULAR | Status: DC
  Filled 2021-01-05: qty 0.15

## 2021-01-05 MED ORDER — METHOCARBAMOL 500 MG PO TABS: 500.0000 mg | ORAL_TABLET | Freq: Three times a day (TID) | ORAL | 0 refills | Status: AC | PRN

## 2021-01-05 MED ORDER — BROMOCRIPTINE MESYLATE 2.5 MG PO TABS
2.5000 mg | ORAL_TABLET | Freq: Every day | ORAL | Status: DC
  Filled 2021-01-05: qty 1

## 2021-01-05 MED ORDER — OXYCODONE HCL 5 MG PO TABS
5.0000 mg | ORAL_TABLET | Freq: Once | ORAL | Status: AC | PRN
  Administered 2021-01-05: 5 mg via ORAL

## 2021-01-05 MED ORDER — FLEET ENEMA 7-19 GM/118ML RE ENEM
1.0000 | ENEMA | Freq: Once | RECTAL | Status: DC | PRN
Start: 2021-01-05 — End: 2021-01-08
  Filled 2021-01-05: qty 1

## 2021-01-05 MED ORDER — THROMBIN 20000 UNITS EX SOLR: CUTANEOUS | Status: DC | PRN

## 2021-01-05 MED ORDER — LOSARTAN POTASSIUM 50 MG PO TABS
50.0000 mg | ORAL_TABLET | Freq: Every day | ORAL | Status: DC
  Filled 2021-01-05: qty 1

## 2021-01-05 MED ORDER — ACETAMINOPHEN 650 MG RE SUPP
650.0000 mg | RECTAL | Status: DC | PRN
  Filled 2021-01-05: qty 1

## 2021-01-05 SURGICAL SUPPLY — 55 items
BAG COUNTER SPONGE SURGICOUNT (BAG) ×2 IMPLANT
BNDG GAUZE ELAST 4 BULKY (GAUZE/BANDAGES/DRESSINGS) ×2 IMPLANT
BUR EGG ELITE 4.0 (BURR) IMPLANT
BUR MATCHSTICK NEURO 3.0 LAGG (BURR) IMPLANT
CANISTER SUCT 3000ML PPV (MISCELLANEOUS) ×2 IMPLANT
CLSR STERI-STRIP ANTIMIC 1/2X4 (GAUZE/BANDAGES/DRESSINGS) ×2 IMPLANT
CORD BIPOLAR FORCEPS 12FT (ELECTRODE) ×2 IMPLANT
COVER SURGICAL LIGHT HANDLE (MISCELLANEOUS) ×2 IMPLANT
DRAIN CHANNEL 15F RND FF W/TCR (WOUND CARE) IMPLANT
DRAPE POUCH INSTRU U-SHP 10X18 (DRAPES) ×2 IMPLANT
DRAPE SURG 17X23 STRL (DRAPES) ×2 IMPLANT
DRAPE U-SHAPE 47X51 STRL (DRAPES) ×2 IMPLANT
DRSG OPSITE POSTOP 3X4 (GAUZE/BANDAGES/DRESSINGS) ×2 IMPLANT
DRSG OPSITE POSTOP 4X10 (GAUZE/BANDAGES/DRESSINGS) ×2 IMPLANT
DURAPREP 26ML APPLICATOR (WOUND CARE) ×2 IMPLANT
ELECT BLADE 4.0 EZ CLEAN MEGAD (MISCELLANEOUS)
ELECT CAUTERY BLADE 6.4 (BLADE) ×2 IMPLANT
ELECT PENCIL ROCKER SW 15FT (MISCELLANEOUS) ×2 IMPLANT
ELECT REM PT RETURN 9FT ADLT (ELECTROSURGICAL) ×2
ELECTRODE BLDE 4.0 EZ CLN MEGD (MISCELLANEOUS) IMPLANT
ELECTRODE REM PT RTRN 9FT ADLT (ELECTROSURGICAL) ×1 IMPLANT
EVACUATOR SILICONE 100CC (DRAIN) IMPLANT
GLOVE SURG ENC MOIS LTX SZ6.5 (GLOVE) ×2 IMPLANT
GLOVE SURG MICRO LTX SZ8.5 (GLOVE) ×2 IMPLANT
GLOVE SURG UNDER POLY LF SZ6.5 (GLOVE) ×2 IMPLANT
GLOVE SURG UNDER POLY LF SZ8.5 (GLOVE) ×2 IMPLANT
GOWN STRL REUS W/ TWL LRG LVL3 (GOWN DISPOSABLE) ×2 IMPLANT
GOWN STRL REUS W/TWL 2XL LVL3 (GOWN DISPOSABLE) ×2 IMPLANT
GOWN STRL REUS W/TWL LRG LVL3 (GOWN DISPOSABLE) ×4
KIT BASIN OR (CUSTOM PROCEDURE TRAY) ×2 IMPLANT
KIT TURNOVER KIT B (KITS) ×2 IMPLANT
NEEDLE 22X1 1/2 (OR ONLY) (NEEDLE) ×2 IMPLANT
NEEDLE SPNL 18GX3.5 QUINCKE PK (NEEDLE) ×4 IMPLANT
NS IRRIG 1000ML POUR BTL (IV SOLUTION) ×2 IMPLANT
PACK LAMINECTOMY ORTHO (CUSTOM PROCEDURE TRAY) ×2 IMPLANT
PACK UNIVERSAL I (CUSTOM PROCEDURE TRAY) ×2 IMPLANT
PAD ARMBOARD 7.5X6 YLW CONV (MISCELLANEOUS) ×4 IMPLANT
PATTIES SURGICAL .5 X.5 (GAUZE/BANDAGES/DRESSINGS) ×2 IMPLANT
PATTIES SURGICAL .5 X1 (DISPOSABLE) ×2 IMPLANT
SPONGE SURGIFOAM ABS GEL 100 (HEMOSTASIS) IMPLANT
STRIP CLOSURE SKIN 1/2X4 (GAUZE/BANDAGES/DRESSINGS) ×2 IMPLANT
SURGIFLO W/THROMBIN 8M KIT (HEMOSTASIS) IMPLANT
SUT BONE WAX W31G (SUTURE) ×2 IMPLANT
SUT MNCRL AB 3-0 PS2 27 (SUTURE) ×2 IMPLANT
SUT VIC AB 0 CT1 27 (SUTURE)
SUT VIC AB 0 CT1 27XBRD ANBCTR (SUTURE) IMPLANT
SUT VIC AB 1 CT1 18XCR BRD 8 (SUTURE) ×1 IMPLANT
SUT VIC AB 1 CT1 8-18 (SUTURE) ×2
SUT VIC AB 2-0 CT1 18 (SUTURE) ×2 IMPLANT
SYR BULB IRRIG 60ML STRL (SYRINGE) ×2 IMPLANT
SYR CONTROL 10ML LL (SYRINGE) ×2 IMPLANT
TOWEL GREEN STERILE (TOWEL DISPOSABLE) ×2 IMPLANT
TOWEL GREEN STERILE FF (TOWEL DISPOSABLE) ×2 IMPLANT
WATER STERILE IRR 1000ML POUR (IV SOLUTION) ×2 IMPLANT
YANKAUER SUCT BULB TIP NO VENT (SUCTIONS) IMPLANT

## 2021-01-05 NOTE — OR Nursing (Signed)
Dr. Ty Hilts states that blunt tip instrument is located at L4-5. Dr. Shon Baton made aware.

## 2021-01-05 NOTE — Brief Op Note (Signed)
01/05/2021  2:38 PM  PATIENT:  Xavier [name].  55 y.o. 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

## 2021-01-05 NOTE — H&P (Signed)
Addendum H&P:  Patient continues to have significant back and radicular left leg pain.  There has been no change in his clinical exam since his last office visit of 12/28/2020.  Plan is to move forward with a left-sided L4-5 decompression and excision of a synovial cyst.  I have gone over the surgery as well as the risks, benefits, and alternatives with the patient.  He is expressed an understanding of this as well as a willingness to move forward with surgery.

## 2021-01-05 NOTE — Op Note (Signed)
OPERATIVE REPORT  DATE OF SURGERY: 01/05/2021  PATIENT NAME:  Xavier [name]. 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  SURGEON:  Venita Lick, MD  PHYSICIAN ASSISTANT: Voncille Lo, PA  ANESTHESIA:   General  EBL: 55 ml   Complications: None  BRIEF HISTORY: Xavier [name]. is a 55 y.o. male who presents to my office with significant back buttock and radicular left leg pain.  Imaging studies confirm significant lateral recess stenosis at L4-5 with a synovial cyst causing L5 nerve root compression on the left side.  Attempts at conservative management had failed to alleviate his symptoms so he elected to proceed with surgery.  All appropriate risks, benefits, and alternatives to surgery were discussed and consent was obtained  PROCEDURE DETAILS: Patient was brought into the operating room and was properly positioned on the operating room table, and teds and SCDs were applied.  After induction with general anesthesia the patient was endotracheally intubated.  A timeout was taken to confirm all important data: Including patient, procedure, and the level.  The patient was turned prone onto the Wilson frame and all bony prominences were well-padded.  The back was then prepped and draped in a standard fashion.  2 spinal needles were then placed for localization of the incision.  I marked out my incision site and infiltrated with quarter percent Marcaine with epinephrine.  Midline incision was made and sharp dissection was carried out down to the deep fascia.  I since then incised the deep fascia and stripped the paraspinal muscles to expose the L4 and L5 spinous process and the L4-5 facet complex bilaterally.  Self-retaining retractor was then placed followed  by a Penfield 4 under the L4 lamina.  A second intraoperative x-ray was taken for localization.  This was also read by the radiologist and we confirmed that we are at the L4-5 level.  The inferior third of the spinous process of L4 as well as the superior third of the spinous process of L5 were resected in order to create our central decompression site.  The left L5 lamina was resected partially with a double-action Leksell rongeur.  There was significant thickening of the ligamentum flavum noted.  And dissecting through the central raphae of the ligamentum flavum with a Penfield 4 until I was able to create a plane between the ligamentum flavum and the thecal sac.  I then used my 2 to 3 mm Kerrison rongeur to remove the ligamentum flavum centrally to create my central decompression.  Although all the symptoms were on the left I did palpate over into the right and resect some of this thickened ligamentum flavum for more adequate decompression.  I then began working into the left side.  I continued using neuro patties to create a plane between the ligamentum flavum and the thecal sac and I resected the ligamentum flavum.  There was significant epidural fat which was also removed.  At this point time I noted that the L5 nerve root was significantly compressed in the lateral recess.  The large epidural veins were coagulated with bipolar electrocautery.  I gently mobilized the thecal sac and the L5 nerve root medially in order to resect the medial third of the L4-5 facet complex.  Small cystic structure consistent with a synovial cyst seen on preoperative  MRI was removed.  I then continued my decompression of the lateral recess till I was above the disc space.  I made sure my decompression was lateral enough so that I can visualize the medial border of the pedicle.  I then continued into the L5 foramen decompressing this with my Kerrison rongeur.  At this point I did freely visualize the L5 nerve root from above the  L4-5 disc space into the lateral recess and into the L5 foramen.  The entire posterior aspect of the thecal sac as well as the lateral recess and the foramen were completely decompressed at this level.  After reviewing the preoperative MRI I able to confirm that I had spanned the area of maximal neural compression and that the synovial cyst had been resected.  At this point I irrigated wound copiously normal saline.  Using bipolar cautery, Floseal, and thrombin Gelfoam I obtained hemostasis.  Because of the patient's postoperative use of anticoagulation I elected to place a deep drain which was brought out of a second stab incision.  I then irrigated again copiously with normal saline and checked 1 last time with my nerve hook to confirm I had adequate decompression from the superior aspect of the L4-5 disc space down into the L5 foramen.  In addition there was no CSF leak noted.  The deep fascia was then closed in a layered fashion with interrupted #1 Vicryl sutures, 2-0 Vicryl sutures, and finally a 3-0 Monocryl for the skin.  Steri-Strips and a dry dressing were applied.  A separate drain stitch was also applied which was a nylon.  Patient was ultimately extubated and transferred the PACU without incident.  End of the case all needle and sponge counts were correct.  There were no adverse intraoperative  Venita Lick, MD 01/05/2021 2:31 PM

## 2021-01-05 NOTE — Anesthesia Postprocedure Evaluation (Signed)
Anesthesia Post Note  Patient: Xavier [name].  Procedure(s) Performed: Left L4-5 Gill Decompression (Left)     Patient location during evaluation: PACU Anesthesia Type: General Level of consciousness: awake and alert Pain management: pain level controlled Vital Signs Assessment: post-procedure vital signs reviewed and stable Respiratory status: spontaneous breathing, nonlabored ventilation, respiratory function stable and patient connected to nasal cannula oxygen Cardiovascular status: blood pressure returned to baseline and stable Postop Assessment: no apparent nausea or vomiting Anesthetic complications: no   No notable events documented.  Last Vitals:  Vitals:   01/05/21 1700 01/05/21 1705  BP:  137/76  Pulse: 82 84  Resp: 17 14  Temp:    SpO2: 98% 97%    Last Pain:  Vitals:   01/05/21 1630  TempSrc:   PainSc: 3                  Trevor Iha

## 2021-01-05 NOTE — Progress Notes (Signed)
RT set up patient's home CPAP, added sterile water to humidification chamber, and placed CPAP within reach on bedside table. RT instructed patient to have RT called if assistance is needed. RN is aware. RT will monitor as needed.

## 2021-01-05 NOTE — Transfer of Care (Signed)
Immediate Anesthesia Transfer of Care Note  Patient: Xavier [name].  Procedure(s) Performed: Left L4-5 Gill Decompression (Left)  Patient Location: PACU  Anesthesia Type:General  Level of Consciousness: awake and oriented  Airway & Oxygen Therapy: Patient Spontanous Breathing and Patient connected to face mask oxygen  Post-op Assessment: Report given to RN and Patient moving all extremities X 4  Post vital signs: Reviewed and stable  Last Vitals:  Vitals Value Taken Time  BP 136/72 01/05/21 1513  Temp    Pulse 78 01/05/21 1515  Resp 13 01/05/21 1515  SpO2 100 % 01/05/21 1515  Vitals shown include unvalidated device data.  Last Pain:  Vitals:   01/05/21 1131  TempSrc:   PainSc: 0-No pain      Patients Stated Pain Goal: 0 (01/05/21 1131)  Complications: No notable events documented.

## 2021-01-05 NOTE — Consult Note (Signed)
[facility] Medical Consultation  Xavier [name]. SUP:[id number] DOB: 30-Dec-1965 DOA: 01/05/2021 PCP: Tammi Sou, MD   Requesting physician: Melina Schools, MD Date of consultation: 01/05/2021 Reason for consultation: Medical management  Impression/Recommendations    Spinal stenosis at L4-L5 with radiculopathy: Patient status post L4-5 decompression with excision of synovial cyst 8/24. -Per orthopedics  Essential hypertension: Blood pressures appear relatively well controlled at this time.  Home medication regimen includes hydrochlorothiazide 12.5 mg daily and losartan 50 mg daily -Continue home regimen as tolerated  Diabetes mellitus type 2, uncontrolled: Last hemoglobin A1c was 7.4 on 12/29/2020.  Home medication regimen includes metformin 1000 mg twice daily Jarddiance 25 mg daily, and Ozempic which patient was increased from 1 mg weekly to 2 mg weekly. -Hypoglycemic protocols -Continue Jardiance -Hold metformin -Resume Ozempic at discharge -CBGs before every meal with moderate SSI -Adjust insulin regimen as needed  CAD: Status post PCI to LAD in 2018.  Patient monotherapy of Plavix due to being on anticoagulation. -Continue statin -Plans to resume Plavix on 8/26 as long as no postop complications appreciated  Hyperlipidemia -Continue crestor and fenofibrate  Anxiety and depression: Patient reported previously being given Valium, but discontinued due to friend passing away from an overdose. -Continue Prozac and Cymbalta  Personal history of DVT/PE: Patient DVT and subsequent right lower lobe pulmonary embolus in 11/2013, but was not thought to be provoked at that time.  -SCDs  -Plans to resume Xarelto 8/26  OSA on CPAP -CPAP per RT  10.  Obesity: BMI 33.23 kg/m -Continue to counsel on need of weight loss and  I will followup again tomorrow. Please contact me if I can be of assistance in the meanwhile. Thank you for this consultation.  Chief  Complaint:Back pain  HPI:  Xavier [name]. is a 55 y.o. male with medical history significant of hypertension, hyperlipidemia, pernicious anemia, coronary artery disease, MI, hyperlipidemia, DVT/PE on Xarelto, anxiety, depression, chronic back, and OSA on CPAP who presents for surgery of his lower back. 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  Tried gabapentin, steriods, and tylenol without relief.  In preparation for surgery patient held Plavix for 5 days prior to surgery and held Xarelto for 3 days prior to surgery.  Patient was bridged with Lovenox until the day before surgery plans to restart follow-up on postop day 2   Review of Systems  Constitutional:  Negative for chills and fever.  Cardiovascular:  Negative for chest pain and leg swelling.  Gastrointestinal:  Negative for abdominal pain, nausea and vomiting.  Musculoskeletal:  Positive for back pain.  All other systems reviewed and are negative.   Past Medical History:  Diagnosis Date   ANEMIA, PERNICIOUS 04/03/2007   Anxiety and depression    Arthritis    "left hand" (06/26/2016)   Chronic lower back pain    grd I degen spondylolisthesis at L4-5 w/associated synovial cyst at facet at this level +underlying severe epidural lipomatosis L5-S1 up to L4-5--plan for surg w/Dr. Rolena Infante 12/2020.   Chronic renal insufficiency, stage 3 (moderate) ([place]) 2022   GFR around 60   Colon cancer screening 10/27/2016   Cologuard NEG-->rpt 3 yrs   Complication of anesthesia    slow to wake up   CORONARY ARTERY DISEASE 12/05/2006   DIABETES MELLITUS, TYPE II dx'd 2001   DVT (deep venous thrombosis) ([place]) 2016   BLE   GERD (  gastroesophageal reflux disease)    History of balanitis    x 2. Holding off on circ as of 05/2020 urol f/u   History of blood transfusion 2001   "when I had my hand OR"   History of gout    History of kidney stones     HYPERCHOLESTEROLEMIA 07/17/2007   HYPERTENSION 12/05/2006   Learning disability    Migraine    "1-2/month" (06/26/2016)   Myocardial infarction ([place]) ~ 2003   Obesity, Class II, BMI 35-39.9    OSA on CPAP    Pt cannot recall setting clearly but thinks it is 5 cm h20.     PE (pulmonary embolism) 2016   Peripheral vascular disease ([place])    Simple renal cyst    X 2 on R kidney (one 5 cm and one 2 cm)   Stroke ([place]) ~ 2006   denies residual on 06/26/2016   TIA (transient ischemic attack) ?06/25/2016   VISUAL ACUITY, DECREASED, RIGHT EYE 02/19/2009   Past Surgical History:  Procedure Laterality Date   CARDIAC CATHETERIZATION  1990s; 2012; 06/2016   06/2016 distal LAD DES stent.  EF 55-65%   CARDIOVASCULAR STRESS TEST  06/2016   Echo stress test->"inconclusive"-->cath was done after this   CATARACT EXTRACTION W/ INTRAOCULAR LENS IMPLANT Right 2017   CORONARY STENT INTERVENTION N/A 06/27/2016   Procedure: Coronary Stent Intervention;  Surgeon: Leonie Man, MD;  Location: [facility];  Service: Cardiovascular;  Laterality: N/A;   EYE SURGERY Right    Cataract   INGUINAL HERNIA REPAIR Bilateral 1990s   INTRAVASCULAR PRESSURE WIRE/FFR STUDY N/A 06/27/2016   Procedure: Intravascular Pressure Wire/FFR Study;  Surgeon: Leonie Man, MD;  Location: [facility];  Service: Cardiovascular;  Laterality: N/A;   LEFT HEART CATH AND CORONARY ANGIOGRAPHY N/A 06/27/2016   Procedure: Left Heart Cath and Coronary Angiography;  Surgeon: Leonie Man, MD;  Location: [facility];  Service: Cardiovascular;  Laterality: N/A;   REATTACHMENT HAND Left ~ 2001   w/carpal tunnel release   UMBILICAL HERNIA REPAIR  1990s   w/IHR   Social History:  reports that he quit smoking about 36 years ago. His smoking use included cigarettes. He started smoking about 37 years ago. He has a 0.30 pack-year smoking history. He has never used smokeless tobacco. He reports that he does not drink alcohol and  does not use drugs.  Allergies  Allergen Reactions   Actos [Pioglitazone] Other (See Comments)    Potential cause of DVT   Bee Venom Anaphylaxis   Klonopin [Clonazepam] Anaphylaxis   Invokana [Canagliflozin] Nausea Only    Nausea/dizzy/bad taste in mouth   Niacin Other (See Comments)    "makes his crazy"   Family History  Problem Relation Age of Onset   COPD Mother    Lymphoma Father     Prior to Admission medications   Medication Sig Start Date End Date Taking? Authorizing Provider  acetaminophen (TYLENOL) 500 MG tablet Take 1,000 mg by mouth every 8 (eight) hours as needed for moderate pain.   Yes [provider]  bromocriptine (PARLODEL) 2.5 MG tablet Take 1 tablet (2.5 mg total) by mouth daily. 10/25/20  Yes [name], MD  clopidogrel (PLAVIX) 75 MG tablet TAKE 1 TABLET BY MOUTH EVERY DAY Patient taking differently: Take 75 mg by mouth daily. 11/09/20  Yes Josue Hector, MD  diazepam (VALIUM) 10 MG tablet Take 10 mg by mouth 2 (two) times daily. 05/23/16  Yes [provider]  DULoxetine (CYMBALTA) 30 MG capsule Take 30 mg by mouth daily.   Yes [provider]  EPINEPHrine 0.3 mg/0.3 mL IJ SOAJ injection Inject 0.3 mLs (0.3 mg total) into the muscle as needed for anaphylaxis. 01/20/20  Yes McGowen, Adrian Blackwater, MD  fenofibrate (TRICOR) 145 MG tablet TAKE 1 TABLET BY MOUTH EVERY DAY 11/09/20  Yes McGowen, Adrian Blackwater, MD  FLUoxetine (PROZAC) 40 MG capsule Take 40 mg by mouth daily.   Yes [provider]  fluticasone (FLONASE) 50 MCG/ACT nasal spray USE ONE SPRAY IN EACH NOSTRIL TWICE A DAY Patient taking differently: Place 1 spray into both nostrils daily as needed for allergies. 01/30/18  Yes [name], MD  gabapentin (NEURONTIN) 300 MG capsule Take 300 mg by mouth 2 (two) times daily. 12/13/20  Yes [provider]  hydrochlorothiazide (HYDRODIURIL) 12.5 MG tablet TAKE 1 TABLET BY MOUTH EVERY DAY WITH THE LOSARTAN 10/21/20  Yes McGowen, Adrian Blackwater, MD  JARDIANCE 25 MG TABS tablet TAKE 1 TABLET BY MOUTH EVERY DAY 08/17/20  Yes [name], MD  losartan (COZAAR) 100 MG tablet TAKE 1/2 TABLET BY MOUTH EVERY DAY 08/19/20  Yes McGowen, Adrian Blackwater, MD  metFORMIN (GLUCOPHAGE-XR) 500 MG 24 hr tablet TAKE 2 TABLETS BY MOUTH TWICE A DAY 11/10/20  Yes [name], MD  methocarbamol (ROBAXIN) 500 MG tablet Take 1 tablet (500 mg total) by mouth every 8 (eight) hours as needed for up to 5 days for muscle spasms. 01/05/21 01/10/21 Yes Melina Schools, MD  nitroGLYCERIN (NITROSTAT) 0.4 MG SL tablet Place 1 tablet (0.4 mg total) under the tongue every 5 (five) minutes as needed for chest pain. 12/02/20  Yes Josue Hector, MD  nystatin cream (MYCOSTATIN) Apply 1 application topically 2 (two) times daily. Patient taking differently: Apply 1 application topically 2 (two) times daily as needed (yeast). 04/13/20  Yes McGowen, Adrian Blackwater, MD  ondansetron (ZOFRAN) 4 MG tablet Take 1 tablet (4 mg total) by mouth every 8 (eight) hours as needed for nausea or vomiting. 01/05/21  Yes Melina Schools, MD  oxyCODONE (OXY IR/ROXICODONE) 5 MG immediate release tablet Take 5 mg by mouth every 6 (six) hours as needed for pain. 09/29/20  Yes [provider]  oxyCODONE-acetaminophen (PERCOCET) 10-325 MG tablet Take 1 tablet by mouth every 6 (six) hours as needed for up to 5 days for pain. 01/05/21 01/10/21 Yes Melina Schools, MD  rosuvastatin (CRESTOR) 5 MG tablet TAKE 1 TABLET BY MOUTH EVERY DAY 11/09/20  Yes McGowen, Adrian Blackwater, MD  Semaglutide, 2 MG/DOSE, (OZEMPIC, 2 MG/DOSE,) 8 MG/3ML SOPN Inject 2 mg into the skin once a week. 12/29/20  Yes [name], MD  XARELTO 20 MG TABS tablet TAKE 1 TABLET BY MOUTH EVERY DAY BEFORE SUPPER 08/17/20  Yes Martyn Ehrich, NP  Blood Glucose Monitoring Suppl (ACCU-CHEK GUIDE ME) w/Device KIT USE TO CHECK BLOOD SUGAR 1 TIME PER DAY. DX CODE: E11.9 10/13/20   [name], MD  cyclobenzaprine (FLEXERIL) 10 MG tablet Take 1 tablet (10 mg total)  by mouth 3 (three) times daily as needed for muscle spasms. 09/02/20   Montine Circle, PA-C  fluconazole (DIFLUCAN) 150 MG tablet TAKE 1 TABLET BY MOUTH EVERY 7 DAYS 12/08/20   McGowen, Adrian Blackwater, MD  glucose blood (ACCU-CHEK GUIDE) test strip check your blood sugar once a day 09/26/20   [name], MD  HYDROcodone-acetaminophen (NORCO/VICODIN) 5-325 MG tablet Take 1 tablet by mouth every 6 (six) hours as needed  for severe pain. 09/16/20   Ripley Fraise, MD  lidocaine (LIDODERM) 5 % Place 1 patch onto the skin daily. Remove & Discard patch within 12 hours or as directed by MD 09/02/20   Montine Circle, PA-C  ondansetron (ZOFRAN ODT) 4 MG disintegrating tablet Take 1 tablet (4 mg total) by mouth every 8 (eight) hours as needed for nausea or vomiting. 09/13/20   Chase Picket, MD   Physical Exam:  Constitutional: Obese male currently in no acute distress Vitals:   01/05/21 1650 01/05/21 1655 01/05/21 1700 01/05/21 1705  BP:    137/76  Pulse: 81 81 82 84  Resp: (!) 21 17 17 14   Temp:      TempSrc:      SpO2: 97% 97% 98% 97%  Weight:      Height:       Eyes: PERRL, lids and conjunctivae normal ENMT: Mucous membranes are moist. Posterior pharynx clear of any exudate or lesions. Neck: normal, supple, no masses, no thyromegaly Respiratory: clear to auscultation bilaterally, no wheezing, no crackles. Normal respiratory effort. No accessory muscle use.  Cardiovascular: Regular rate and rhythm, no murmurs / rubs / gallops. No extremity edema. 2+ pedal pulses. No carotid bruits.  Abdomen: no tenderness, no masses palpated. No hepatosplenomegaly. Bowel sounds positive.  Musculoskeletal: no clubbing / cyanosis.  Lumbar JP drain in place Neurologic: CN 2-12 grossly intact.  Patient able to move all extremities Psychiatric: Normal judgment and insight. Alert and oriented x 3. Normal mood.   Labs on Admission:  Basic Metabolic Panel: Recent Labs  Lab 01/05/21 1120  NA 135  K 3.5  CL 100   CO2 23  GLUCOSE 118*  BUN 17  CREATININE 1.21  CALCIUM 9.3   Liver Function Tests: No results for input(s): AST, ALT, ALKPHOS, BILITOT, PROT, ALBUMIN in the last 168 hours. No results for input(s): LIPASE, AMYLASE in the last 168 hours. No results for input(s): AMMONIA in the last 168 hours. CBC: Recent Labs  Lab 01/05/21 1120  WBC 6.0  HGB 14.3  HCT 43.0  MCV 91.5  PLT 389   Cardiac Enzymes: No results for input(s): CKTOTAL, CKMB, CKMBINDEX, TROPONINI in the last 168 hours. BNP: Invalid input(s): POCBNP CBG: Recent Labs  Lab 01/05/21 1119 01/05/21 1522  GLUCAP 125* 108*    Radiological Exams on Admission: DG Lumbar Spine 2-3 Views  Result Date: 01/05/2021 CLINICAL DATA:  L4-L5 decompression EXAM: LUMBAR SPINE - 2-3 VIEW COMPARISON:  None. FINDINGS: Two lateral intraoperative views lumbar spine provided. Initial view demonstrates sharp tip probes posterior to the L5 and S1 vertebral bodies. Second film demonstrates a curved tip probe at the L4-L5 disc space. IMPRESSION: Intraoperative views as above. Findings related to operating room at time of interpretation Electronically Signed   By: Suzy Bouchard M.D.   On: 01/05/2021 13:36      Time spent: >45 minutes  [facility]   If 7PM-7AM, please contact night-coverage

## 2021-01-05 NOTE — Anesthesia Procedure Notes (Signed)
Procedure Name: Intubation Date/Time: 01/05/2021 12:55 PM Performed by: Shary Decamp, CRNA Pre-anesthesia Checklist: Patient identified, Patient being monitored, Timeout performed, Emergency Drugs available and Suction available Patient Re-evaluated:Patient Re-evaluated prior to induction Oxygen Delivery Method: Circle System Utilized Preoxygenation: Pre-oxygenation with 100% oxygen Induction Type: IV induction Ventilation: Mask ventilation without difficulty Laryngoscope Size: Miller and 2 Grade View: Grade I Tube type: Oral Tube size: 7.5 mm Number of attempts: 1 Airway Equipment and Method: Stylet Placement Confirmation: ETT inserted through vocal cords under direct vision, positive ETCO2 and breath sounds checked- equal and bilateral Secured at: 23 cm Tube secured with: Tape Dental Injury: Teeth and Oropharynx as per pre-operative assessment

## 2021-01-05 NOTE — Discharge Instructions (Signed)

## 2021-01-06 ENCOUNTER — Encounter (HOSPITAL_COMMUNITY): Payer: Self-pay | Admitting: Orthopedic Surgery

## 2021-01-06 DIAGNOSIS — E118 Type 2 diabetes mellitus with unspecified complications: Secondary | ICD-10-CM

## 2021-01-06 DIAGNOSIS — E1165 Type 2 diabetes mellitus with hyperglycemia: Secondary | ICD-10-CM

## 2021-01-06 DIAGNOSIS — I10 Essential (primary) hypertension: Secondary | ICD-10-CM

## 2021-01-06 LAB — BASIC METABOLIC PANEL
Anion gap: 9 (ref 5–15)
BUN: 15 mg/dL (ref 6–20)
CO2: 24 mmol/L (ref 22–32)
Calcium: 8.8 mg/dL — ABNORMAL LOW (ref 8.9–10.3)
Chloride: 101 mmol/L (ref 98–111)
Creatinine, Ser: 1.37 mg/dL — ABNORMAL HIGH (ref 0.61–1.24)
GFR, Estimated: >60 mL/min (ref >60)
Glucose, Bld: 137 mg/dL — ABNORMAL HIGH (ref 70–99)
Potassium: 4 mmol/L (ref 3.5–5.1)
Sodium: 134 mmol/L — ABNORMAL LOW (ref 135–145)

## 2021-01-06 LAB — GLUCOSE, CAPILLARY
Glucose-Capillary: 151 mg/dL — ABNORMAL HIGH (ref 70–99)
Glucose-Capillary: 170 mg/dL — ABNORMAL HIGH (ref 70–99)
Glucose-Capillary: 134 mg/dL — ABNORMAL HIGH (ref 70–99)
Glucose-Capillary: 126 mg/dL — ABNORMAL HIGH (ref 70–99)

## 2021-01-06 MED ORDER — HYDROCHLOROTHIAZIDE 25 MG PO TABS
12.5000 mg | ORAL_TABLET | Freq: Every day | ORAL | Status: DC
  Administered 2021-01-07 – 2021-01-08 (×2): 12.5 mg via ORAL
  Filled 2021-01-06 (×2): qty 1

## 2021-01-06 MED ORDER — LOSARTAN POTASSIUM 50 MG PO TABS
50.0000 mg | ORAL_TABLET | Freq: Every day | ORAL | Status: DC
  Administered 2021-01-07 – 2021-01-08 (×2): 50 mg via ORAL
  Filled 2021-01-06 (×2): qty 1

## 2021-01-06 MED ORDER — INSULIN ASPART 100 UNIT/ML IJ SOLN
0.0000 [IU] | Freq: Three times a day (TID) | INTRAMUSCULAR | Status: DC
  Administered 2021-01-06 – 2021-01-08 (×3): 1 [IU] via SUBCUTANEOUS

## 2021-01-06 MED ORDER — INSULIN ASPART 100 UNIT/ML IJ SOLN: 0.0000 [IU] | Freq: Every day | INTRAMUSCULAR | Status: DC

## 2021-01-06 MED ORDER — INSULIN ASPART 100 UNIT/ML IJ SOLN
2.0000 [IU] | Freq: Three times a day (TID) | INTRAMUSCULAR | Status: DC
  Administered 2021-01-06 – 2021-01-08 (×6): 2 [IU] via SUBCUTANEOUS

## 2021-01-06 MED FILL — Thrombin For Soln Kit 20000 Unit: CUTANEOUS | Qty: 1 | Status: AC

## 2021-01-06 NOTE — Progress Notes (Signed)
Subjective: 1 Day Post-Op Procedure(s) (LRB): Left L4-5 Gill Decompression (Left) Patient reports pain as mild.   Has had a mild HA, reports this is normal for him. Denies it being positional.  Has not been up OOB yet due to not having his LSO braqce-- wife is bringing it today. Tolerating PO without N/V +void +flatus Denies calf pain, CP, SOB, sweats/chills  Objective: Vital signs in last 24 hours: Temp:  [96.4 F (35.8 C)-98.7 F (37.1 C)] 98.7 F (37.1 C) (08/25 0309) Pulse Rate:  [74-86] 77 (08/25 0309) Resp:  [14-22] 17 (08/25 0309) BP: (126-149)/(67-92) 136/67 (08/25 0309) SpO2:  [92 %-100 %] 98 % (08/25 0309) Weight:  [111.1 kg] 111.1 kg (08/24 1113)  Intake/Output from previous day: 08/24 0701 - 08/25 0700 In: 2776.6 [P.O.:240; I.V.:1986.6; IV Piggyback:550] Out: 1883 [Urine:1700; Drains:128; Blood:55] Intake/Output this shift: No intake/output data recorded.  Recent Labs    01/05/21 1120  HGB 14.3   Recent Labs    01/05/21 1120  WBC 6.0  RBC 4.70  HCT 43.0  PLT 389   Recent Labs    01/05/21 1120 01/06/21 0303  NA 135 134*  K 3.5 4.0  CL 100 101  CO2 23 24  BUN 17 15  CREATININE 1.21 1.37*  GLUCOSE 118* 137*  CALCIUM 9.3 8.8*   Recent Labs    01/05/21 1104  INR 0.9    Neurologically intact ABD soft Neurovascular intact Sensation intact distally Intact pulses distally Dorsiflexion/Plantar flexion intact Incision: Main incisional dressing C/D/I. Drain dressing saturated. Total of 40 cc of serous anguinous output. Compartment soft   Assessment/Plan: 1 Day Post-Op Procedure(s) (LRB): Left L4-5 Gill Decompression (Left) Advance diet Up with therapy Encouraged IS DVT Ppx: Teds, SCDs, ambulation. Plan is to resume xarelto and plavix tomorrow afternoon.  Leave drain in for this am. Will reassess later this afternoon.   D/C anticipated tomorrow or Saturday     Rhodia Albright 01/06/2021, 7:53 AM

## 2021-01-06 NOTE — Progress Notes (Signed)
[facility] CONSULT PROGRESS NOTE    Progress Note  Xavier [name].  GXQ:[id number] DOB: [birth year] DOA: 01/05/2021 PCP: [name], MD     Brief Narrative:   Xavier [name]. is an 55 y.o. male past medical history significant for essential hypertension pernicious anemia, coronary artery disease with a history of an MI, DVT/PE on Xarelto, anxiety and depression obstructive sleep apnea on CPAP who presents for surgery for lower back pain.  He was scheduled for an surgical intervention Plavix was held as well as Xarelto.    Significant Events: 01/05/2021 left L5 L4 Gill decompression due to lumbar facet cyst  Assessment/Plan:   Spinal stenosis at L4-L5 level Status post L4-L5 decompression with excision of synovial cyst. Continue further management per Ortho. Ortho to dictate when to start anticoagulation. Still having significant output from the drain.  Essential hypertension: Hold antihypertensive medications for today can resume them tomorrow 01/07/2021.  Diabetes mellitus type 2 uncontrolled: With an A1c of 7.4. Continue Jardiance hold metformin. Continue sliding scale insulin.  History of MI/CAD: Status post PCI to the LAD in 2018 was currently on Plavix this was held for surgical procedure. Orthopedic surgery to dictate when to restart Plavix.  Hyperlipidemia: Continue Crestor and fenofibrate.  Anxiety and depression: Continue Prozac and Cymbalta.  History of PE DVT: Holding Xarelto, orthopedic surgery to dictate when to restart Xarelto the previously said 01/07/2021.  Started sleep apnea on CPAP: Continue CPAP.  DVT prophylaxis: none Family Communication:none Status is: Inpatient  Remains inpatient appropriate because:Hemodynamically unstable  Dispo: The patient is from: Home              Anticipated d/c is to: Home              Patient currently is not medically stable to d/c.   Difficult to place patient No        Code Status:      Code Status Orders  (From admission, onward)           Start     Ordered   01/05/21 1720  Full code  Continuous        01/05/21 1719           Code Status History     Date Active Date Inactive Code Status Order ID Comments User Context   06/25/2016 2017 06/28/2016 1331 Full Code [id number]  Bobette Mo, MD Inpatient   06/25/2016 2017 06/25/2016 2017 Full Code [id number]  Bobette Mo, MD Inpatient   11/26/2013 0043 11/27/2013 1348 Full Code [id number]  Doree Albee, MD ED         IV Access:   Peripheral IV   Procedures and diagnostic studies:   DG Lumbar Spine 2-3 Views  Result Date: 01/05/2021 CLINICAL DATA:  L4-L5 decompression EXAM: LUMBAR SPINE - 2-3 VIEW COMPARISON:  None. FINDINGS: Two lateral intraoperative views lumbar spine provided. Initial view demonstrates sharp tip probes posterior to the L5 and S1 vertebral bodies. Second film demonstrates a curved tip probe at the L4-L5 disc space. IMPRESSION: Intraoperative views as above. Findings related to operating room at time of interpretation Electronically Signed   By: Genevive Bi M.D.   On: 01/05/2021 13:36     Medical Consultants:   None.   Subjective:    Xavier [name]. relates he is in pain but is better than yesterday.  Objective:    Vitals:   01/05/21 2042 01/05/21 2200 01/05/21 2322 01/06/21 0309  BP: Marland Kitchen)  148/88 136/74 126/79 136/67  Pulse: 79 78 74 77  Resp: 16 16 14 17   Temp:   98.1 F (36.7 C) 98.7 F (37.1 C)  TempSrc:   Oral Oral  SpO2: 98% 98% 93% 98%  Weight:      Height:       SpO2: 98 % O2 Flow Rate (L/min): 10 L/min   Intake/Output Summary (Last 24 hours) at 01/06/2021 0711 Last data filed at 01/06/2021 0600 Gross per 24 hour  Intake 2776.64 ml  Output 1883 ml  Net 893.64 ml   Filed Weights   01/05/21 1113  Weight: 111.1 kg    Exam: General exam: In no acute distress. Respiratory system: Good air movement and clear to  auscultation. Cardiovascular system: S1 & S2 heard, RRR. No JVD. Gastrointestinal system: Abdomen is nondistended, soft and nontender.  Extremities: No pedal edema. Skin: No rashes, lesions or ulcers   Data Reviewed:    Labs: Basic Metabolic Panel: Recent Labs  Lab 01/05/21 1120 01/06/21 0303  NA 135 134*  K 3.5 4.0  CL 100 101  CO2 23 24  GLUCOSE 118* 137*  BUN 17 15  CREATININE 1.21 1.37*  CALCIUM 9.3 8.8*   GFR Estimated Creatinine Clearance: 79.3 mL/min (A) (by C-G formula based on SCr of 1.37 mg/dL (H)). Liver Function Tests: No results for input(s): AST, ALT, ALKPHOS, BILITOT, PROT, ALBUMIN in the last 168 hours. No results for input(s): LIPASE, AMYLASE in the last 168 hours. No results for input(s): AMMONIA in the last 168 hours. Coagulation profile Recent Labs  Lab 01/05/21 1104  INR 0.9   COVID-19 Labs  No results for input(s): DDIMER, FERRITIN, LDH, CRP in the last 72 hours.  Lab Results  Component Value Date   SARSCOV2NAA RESULT: NEGATIVE 01/03/2021    CBC: Recent Labs  Lab 01/05/21 1120  WBC 6.0  HGB 14.3  HCT 43.0  MCV 91.5  PLT 389   Cardiac Enzymes: No results for input(s): CKTOTAL, CKMB, CKMBINDEX, TROPONINI in the last 168 hours. BNP (last 3 results) No results for input(s): PROBNP in the last 8760 hours. CBG: Recent Labs  Lab 01/05/21 1119 01/05/21 1522 01/05/21 2148  GLUCAP 125* 108* 134*   D-Dimer: No results for input(s): DDIMER in the last 72 hours. Hgb A1c: Recent Labs    01/05/21 1120  HGBA1C 7.5*   Lipid Profile: No results for input(s): CHOL, HDL, LDLCALC, TRIG, CHOLHDL, LDLDIRECT in the last 72 hours. Thyroid function studies: No results for input(s): TSH, T4TOTAL, T3FREE, THYROIDAB in the last 72 hours.  Invalid input(s): FREET3 Anemia work up: No results for input(s): VITAMINB12, FOLATE, FERRITIN, TIBC, IRON, RETICCTPCT in the last 72 hours. Sepsis Labs: Recent Labs  Lab 01/05/21 1120  WBC 6.0    Microbiology Recent Results (from the past 240 hour(s))  SARS Coronavirus 2 (TAT 6-24 hrs)     Status: None   Collection Time: 01/03/21 12:00 AM  Result Value Ref Range Status   SARS Coronavirus 2 RESULT: NEGATIVE  Final    Comment: RESULT: NEGATIVESARS-CoV-2 INTERPRETATION:A NEGATIVE  test result means that SARS-CoV-2 RNA was not present in the specimen above the limit of detection of this test. This does not preclude a possible SARS-CoV-2 infection and should not be used as the  sole basis for patient management decisions. Negative results must be combined with clinical observations, patient history, and epidemiological information. Optimum specimen types and timing for peak viral levels during infections caused by SARS-CoV-2  have not been determined.  Collection of multiple specimens or types of specimens may be necessary to detect virus. Improper specimen collection and handling, sequence variability under primers/probes, or organism present below the limit of detection may  lead to false negative results. Positive and negative predictive values of testing are highly dependent on prevalence. False negative test results are more likely when prevalence of disease is high.The expected result is NEGATIVE.Fact S heet for  Healthcare Providers: [employer] Sheet for Patients: https://po[employer].org/ Reference Range - Negative      Medications:    bromocriptine  2.5 mg Oral Daily   docusate sodium  100 mg Oral BID   DULoxetine  30 mg Oral Daily   empagliflozin  25 mg Oral Daily   fenofibrate  160 mg Oral Daily   FLUoxetine  40 mg Oral Daily   gabapentin  300 mg Oral BID   hydrochlorothiazide  12.5 mg Oral Daily   insulin aspart  0-15 Units Subcutaneous TID WC   losartan  50 mg Oral Daily   rosuvastatin  5 mg Oral Daily   sodium chloride flush  3 mL Intravenous Q12H   Continuous Infusions:  sodium chloride     lactated ringers  85 mL/hr at 01/06/21 0600   methocarbamol (ROBAXIN) IV        LOS: 1 day   Marinda Elk  [facility]  01/06/2021, 7:11 AM

## 2021-01-06 NOTE — Evaluation (Signed)
Physical Therapy Evaluation Patient Details Name: Xavier [name]. MRN: [id number] DOB: 12-20-1965 Today's Date: 01/06/2021   History of Present Illness  Pt is 55 yo male who initially presented as OP with with significant back buttock and radicular left leg pain. Pt presented 01/05/21 and is now s/p L4-5 decompression with medial facetectomy with resection of synovial cyst. PMH: cardiac valve replacement, stents (on plavix), PE (on xarelto). OSA, DM.   Clinical Impression  Pt presents with condition above and deficits mentioned below, see PT Problem List. Prior to onset of his symptoms, he was independent without AD/AE, but has recently begun to use a RW for mobility. He lives with his wife in a house with 4 STE without rails. Currently, pt reports improved sensation in his L leg now, denying any numbness/tingling, compared to prior to surgery. He does display some weakness in his L leg compared to his R in addition to gross incoordination and balance deficits. He is requiring min guard assist-minA for all functional mobility, using a RW or UE support for gait and stairs. He has a hx of falls and is at risk for further falls. Thus, recommending follow-up with Outpatient PT. Will continue to follow acutely.    Follow Up Recommendations Outpatient PT;Supervision for mobility/OOB    Equipment Recommendations  None recommended by PT    Recommendations for Other Services       Precautions / Restrictions Precautions Precautions: Back Precaution Booklet Issued: Yes (comment) Precaution Comments: Reviewed with pt recalling 2/3 Required Braces or Orthoses: Spinal Brace Spinal Brace: Lumbar corset;Applied in sitting position Restrictions Weight Bearing Restrictions: No      Mobility  Bed Mobility Overal bed mobility: Needs Assistance Bed Mobility: Rolling;Sit to Sidelying Rolling: Min guard Sidelying to sit: Min assist     Sit to sidelying: Min guard General bed mobility comments: min  guard for safety. Cues to lean laterally onto elbow then swing legs onto bed to transition sit > sidelying > roll back to supine.    Transfers Overall transfer level: Needs assistance Equipment used: Rolling walker (2 wheeled) Transfers: Sit to/from Stand Sit to Stand: Min guard         General transfer comment: Pt initially standing from recliner with buttocks too far back in seat, resulting in him extending and twisting his trunk to push up to stand. Educated pt to come to edge of seat to prevent breaking spinal precautions. Cues provided to reach back to control descent to sit. Min guard assist for safety.  Ambulation/Gait Ambulation/Gait assistance: Min guard Gait Distance (Feet): 160 Feet Assistive device: Rolling walker (2 wheeled) Gait Pattern/deviations: Step-through pattern;Narrow base of support;Decreased stride length Gait velocity: reduced Gait velocity interpretation: <1.8 ft/sec, indicate of risk for recurrent falls General Gait Details: Pt with slow gait and decreased bil step length with narrow stance. Educated pt on keeping RW on ground, even during turns, and proper placement of body in relation to RW. No LOB, min guard for safety.  Stairs Stairs: Yes Stairs assistance: Min assist Stair Management: Step to pattern;Forwards (x1 HHA) Number of Stairs: 3 General stair comments: Ascends and descends with step-to pattern and x1 HHA minA to simulate home set-up as no rails at home. Educated pt on leading up with strong R leg and down with L weaker leg for safety, needs cues to remind on occasion. No LOB.  Wheelchair Mobility    Modified Rankin (Stroke Patients Only)       Balance Overall balance assessment: Needs assistance  Sitting-balance support: Feet supported Sitting balance-Leahy Scale: Fair     Standing balance support: Bilateral upper extremity supported;Single extremity supported;No upper extremity supported Standing balance-Leahy Scale: Fair Standing  balance comment: Able to stand statically without UE support but needs 1-2 UE support for safe mobility.                             Pertinent Vitals/Pain Pain Assessment: Faces Pain Score: 8  Faces Pain Scale: Hurts little more Pain Location: back Pain Descriptors / Indicators: Constant;Sore;Grimacing;Operative site guarding Pain Intervention(s): Limited activity within patient's tolerance;Monitored during session;Repositioned    Home Living Family/patient expects to be discharged to:: Private residence Living Arrangements: Spouse/significant other;Children Available Help at Discharge: Family Type of Home: House (modular) Home Access: Stairs to enter Entrance Stairs-Rails: None Entrance Stairs-Number of Steps: 4 wide and long Home Layout: One level Home Equipment: Environmental consultant - 2 wheels;Toilet riser;Hand held shower head Additional Comments: small walkin shower with no room for seat    Prior Function Level of Independence: Needs assistance   Gait / Transfers Assistance Needed: rw as needed with onset of symptoms, increased use prior to surgery. No AD prior to onset of symptoms     Comments: rw as needed more so recently     Hand Dominance        Extremity/Trunk Assessment   Upper Extremity Assessment Upper Extremity Assessment: Defer to OT evaluation    Lower Extremity Assessment Lower Extremity Assessment: LLE deficits/detail LLE Deficits / Details: Slight weakness of MMT scores 4+ grossly compared to MMT scores of 5 grossly on R; denies numbness/tingling in L leg, improved from surgery; gross incoordination LLE Coordination: decreased gross motor    Cervical / Trunk Assessment Cervical / Trunk Assessment: Other exceptions (s/p back surgery on 8/24)  Communication   Communication: No difficulties  Cognition Arousal/Alertness: Awake/alert Behavior During Therapy: WFL for tasks assessed/performed Overall Cognitive Status: Within Functional Limits for tasks  assessed                                        General Comments General comments (skin integrity, edema, etc.): Educated pt on car transfers and reclining of chairs. Educated pt and wife on safe guarding on stairs and regular mobility and to avoid staying in one position for long periods of time.    Exercises     Assessment/Plan    PT Assessment Patient needs continued PT services  PT Problem List Decreased strength;Decreased range of motion;Decreased activity tolerance;Decreased balance;Decreased mobility;Decreased coordination;Decreased knowledge of use of DME;Decreased knowledge of precautions;Pain       PT Treatment Interventions DME instruction;Gait training;Stair training;Functional mobility training;Therapeutic activities;Therapeutic exercise;Balance training;Neuromuscular re-education;Patient/family education    PT Goals (Current goals can be found in the Care Plan section)  Acute Rehab PT Goals Patient Stated Goal: home. regain independence. PT Goal Formulation: With patient/family Time For Goal Achievement: 01/20/21 Potential to Achieve Goals: Good    Frequency Min 5X/week   Barriers to discharge        Co-evaluation               AM-PAC PT "6 Clicks" Mobility  Outcome Measure Help needed turning from your back to your side while in a flat bed without using bedrails?: A Little Help needed moving from lying on your back to sitting on the side of a flat  bed without using bedrails?: A Little Help needed moving to and from a bed to a chair (including a wheelchair)?: A Little Help needed standing up from a chair using your arms (e.g., wheelchair or bedside chair)?: A Little Help needed to walk in hospital room?: A Little Help needed climbing 3-5 steps with a railing? : A Little 6 Click Score: 18    End of Session Equipment Utilized During Treatment: Back brace Activity Tolerance: Patient tolerated treatment well Patient left: in bed;with bed  alarm set;with call bell/phone within reach;with family/visitor present Nurse Communication: Mobility status PT Visit Diagnosis: Unsteadiness on feet (R26.81);Other abnormalities of gait and mobility (R26.89);Muscle weakness (generalized) (M62.81);Difficulty in walking, not elsewhere classified (R26.2);History of falling (Z91.81);Pain Pain - part of body:  (back)    Time: 0981-1914 PT Time Calculation (min) (ACUTE ONLY): 27 min   Charges:   PT Evaluation $PT Eval Moderate Complexity: 1 Mod PT Treatments $Therapeutic Activity: 8-22 mins        Raymond Gurney, PT, DPT Acute Rehabilitation Services  Pager: [phone] Office: [phone]   Jewel Baize 01/06/2021, 12:22 PM

## 2021-01-06 NOTE — Evaluation (Signed)
Occupational Therapy Evaluation Patient Details Name: Xavier [name]. MRN: [id number] DOB: 1965-08-22 Today's Date: 01/06/2021    History of Present Illness Pt is 55 yo male who initially presented as OP with with significant back buttock and radicular left leg pain. Pt is now s/p L4-5 decompression with medial facetectomy with resection of synovial cyst. PMH: cardiac valve replacement, stents (on plavix), PE (on xarelto). OSA, DM.   Clinical Impression   Pt admitted with the above diagnoses and presents with below problem list. Pt will benefit from continued acute OT to address the below listed deficits and maximize independence with basic ADLs prior to d/c home. At baseline pt is independent with ADLs; does endorse recent use of rw as needed as back/leg pain worsened. This session pt completed bed mobility, walked distance to/from bathroom and was up in recliner at end of session. Began education on back precautions, back wearing schedule, and compensatory strategies/AE for LB ADLs. Pt currently needs up to min A with LB ADLs. Min guard to walk in the room using a rw. Up to min A with bed mobility and standing. Spouse present throughout session and included in education.     Follow Up Recommendations  Supervision - Intermittent; Outpatient OT    Equipment Recommendations  None recommended by OT    Recommendations for Other Services PT consult     Precautions / Restrictions Precautions Precautions: Back Precaution Booklet Issued: Yes (comment) Required Braces or Orthoses: Spinal Brace Spinal Brace: Lumbar corset;Applied in sitting position Restrictions Weight Bearing Restrictions: No      Mobility Bed Mobility Overal bed mobility: Needs Assistance Bed Mobility: Rolling;Sidelying to Sit Rolling: Min guard Sidelying to sit: Min assist       General bed mobility comments: min guard for safety. Min A for trunk elevation. first time up since surgery    Transfers Overall  transfer level: Needs assistance Equipment used: Rolling walker (2 wheeled) Transfers: Sit to/from Stand Sit to Stand: Min guard;Min assist         General transfer comment: min guard for safety. min A to steady on initial stand. EOB elevated to match home    Balance Overall balance assessment: Needs assistance Sitting-balance support: Feet supported Sitting balance-Leahy Scale: Fair     Standing balance support: Bilateral upper extremity supported Standing balance-Leahy Scale: Poor Standing balance comment: fair static, poor dynamic (sought external support for dynamic tasks in standing)                           ADL either performed or assessed with clinical judgement   ADL Overall ADL's : Needs assistance/impaired Eating/Feeding: Set up;Sitting   Grooming: Set up;Sitting   Upper Body Bathing: Set up;Sitting   Lower Body Bathing: Minimal assistance;Sit to/from stand   Upper Body Dressing : Set up;Sitting   Lower Body Dressing: Minimal assistance;Sit to/from stand   Toilet Transfer: Min guard;Minimal assistance   Toileting- Clothing Manipulation and Hygiene: Minimal assistance;Sit to/from stand;Sitting/lateral lean   Tub/ Shower Transfer: Min guard;Ambulation   Functional mobility during ADLs: Min guard;Rolling walker       Vision         Perception     Praxis      Pertinent Vitals/Pain Pain Assessment: 0-10 Pain Score: 8  Pain Location: back Pain Descriptors / Indicators: Constant;Sore;Grimacing Pain Intervention(s): Limited activity within patient's tolerance;Monitored during session;Repositioned     Hand Dominance     Extremity/Trunk Assessment Upper Extremity Assessment  Upper Extremity Assessment: Overall WFL for tasks assessed;Generalized weakness   Lower Extremity Assessment Lower Extremity Assessment: Defer to PT evaluation   Cervical / Trunk Assessment Cervical / Trunk Assessment: Other exceptions (s/p back surgery on 8/24)    Communication Communication Communication: No difficulties   Cognition Arousal/Alertness: Awake/alert Behavior During Therapy: WFL for tasks assessed/performed Overall Cognitive Status: Within Functional Limits for tasks assessed                                     General Comments  Spouse present and included in education    Exercises     Shoulder Instructions      Home Living Family/patient expects to be discharged to:: Private residence Living Arrangements: Spouse/significant other;Children Available Help at Discharge: Family Type of Home: House (modular) Home Access: Stairs to enter Entrance Stairs-Number of Steps: 4 wide and long Entrance Stairs-Rails: None Home Layout: One level     Bathroom Shower/Tub: Tub/shower unit;Walk-in shower         Home Equipment: Dan Humphreys - 2 wheels;Toilet riser;Hand held shower head   Additional Comments: small walkin shower with no room for seat      Prior Functioning/Environment Level of Independence: Needs assistance  Gait / Transfers Assistance Needed: rw as needed with onset of symptoms, increased use prior to surgery     Comments: rw as needed more so recently        OT Problem List: Decreased activity tolerance;Impaired balance (sitting and/or standing);Decreased knowledge of use of DME or AE;Decreased knowledge of precautions;Pain      OT Treatment/Interventions: Self-care/ADL training;DME and/or AE instruction;Therapeutic activities;Patient/family education;Balance training;Therapeutic exercise    OT Goals(Current goals can be found in the care plan section) Acute Rehab OT Goals Patient Stated Goal: home. regain independence. OT Goal Formulation: With patient/family Time For Goal Achievement: 01/20/21 Potential to Achieve Goals: Good  OT Frequency: Min 2X/week   Barriers to D/C:            Co-evaluation              AM-PAC OT "6 Clicks" Daily Activity     Outcome Measure Help from  another person eating meals?: None Help from another person taking care of personal grooming?: None Help from another person toileting, which includes using toliet, bedpan, or urinal?: A Little Help from another person bathing (including washing, rinsing, drying)?: A Little Help from another person to put on and taking off regular upper body clothing?: A Little Help from another person to put on and taking off regular lower body clothing?: A Little 6 Click Score: 20   End of Session Equipment Utilized During Treatment: Back brace;Rolling walker  Activity Tolerance: Patient tolerated treatment well Patient left: in chair;with call bell/phone within reach;with chair alarm set;with family/visitor present  OT Visit Diagnosis: Unsteadiness on feet (R26.81);Pain;Muscle weakness (generalized) (M62.81)                Time: 2376-2831 OT Time Calculation (min): 34 min Charges:  OT General Charges $OT Visit: 1 Visit OT Evaluation $OT Eval Moderate Complexity: 1 Mod OT Treatments $Self Care/Home Management : 8-22 mins  Raynald Kemp, OT Acute Rehabilitation Services Pager: [phone] Office: [phone]   Pilar Grammes 01/06/2021, 11:37 AM

## 2021-01-07 LAB — GLUCOSE, CAPILLARY
Glucose-Capillary: 190 mg/dL — ABNORMAL HIGH (ref 70–99)
Glucose-Capillary: 122 mg/dL — ABNORMAL HIGH (ref 70–99)
Glucose-Capillary: 109 mg/dL — ABNORMAL HIGH (ref 70–99)
Glucose-Capillary: 151 mg/dL — ABNORMAL HIGH (ref 70–99)

## 2021-01-07 MED ORDER — RIVAROXABAN 20 MG PO TABS
20.0000 mg | ORAL_TABLET | Freq: Every day | ORAL | Status: DC
  Administered 2021-01-07: 20 mg via ORAL
  Filled 2021-01-07: qty 1

## 2021-01-07 MED ORDER — CLOPIDOGREL BISULFATE 75 MG PO TABS
75.0000 mg | ORAL_TABLET | Freq: Every day | ORAL | Status: DC
  Administered 2021-01-07 – 2021-01-08 (×2): 75 mg via ORAL
  Filled 2021-01-07 (×2): qty 1

## 2021-01-07 NOTE — Plan of Care (Signed)

## 2021-01-07 NOTE — Progress Notes (Signed)
Physical Therapy Treatment Patient Details Name: Xavier [name]. MRN: [id number] DOB: 09-Oct-1965 Today's Date: 01/07/2021    History of Present Illness Pt is 55 yo male who initially presented as OP with with significant back buttock and radicular left leg pain. Pt presented 01/05/21 and is now s/p L4-5 decompression with medial facetectomy with resection of synovial cyst. PMH: cardiac valve replacement, stents (on plavix), PE (on xarelto). OSA, DM.    PT Comments    Pt is displaying improved balance and compliance with his spinal precautions. However, intermittently he needs cues to avoid twisting. In addition, upon arrival he was sitting in the chair without his brace donned. Educated pt on having brace donned when OOB. Pt able to progress to navigating a flight of stairs with x1 HHA with a reciprocal pattern today. He did use the rail intermittently when ascending with his L leg though, indicating continued weakness and instability. Will continue to follow acutely. Current recommendations remain appropriate.    Follow Up Recommendations  Outpatient PT;Supervision for mobility/OOB     Equipment Recommendations  None recommended by PT    Recommendations for Other Services       Precautions / Restrictions Precautions Precautions: Back Precaution Booklet Issued: Yes (comment) Precaution Comments: Reviewed and cues to maintain intermittently during session Required Braces or Orthoses: Spinal Brace Spinal Brace: Lumbar corset;Applied in sitting position (pt sitting up in chair without it donned upon arrival, educated pt on need to have it donned) Restrictions Weight Bearing Restrictions: No    Mobility  Bed Mobility               General bed mobility comments: Pt sitting up in recliner upon arrival and at end of session.    Transfers Overall transfer level: Needs assistance Equipment used: Rolling walker (2 wheeled) Transfers: Sit to/from Stand Sit to Stand: Min guard          General transfer comment: Cues to scoot anteriorly to edge of surface prior to stand and cues for hand placement with return to sit. Min guard assist for safety.  Ambulation/Gait Ambulation/Gait assistance: Supervision Gait Distance (Feet): 350 Feet Assistive device: Rolling walker (2 wheeled) Gait Pattern/deviations: Step-through pattern;Narrow base of support;Decreased stride length Gait velocity: reduced Gait velocity interpretation: <1.8 ft/sec, indicate of risk for recurrent falls General Gait Details: Pt with slow gait and decreased bil step length with narrow stance. Cues provided to widen stance, esp during turns and to increase speed with min success. Educated pt on keeping RW on ground, even during turns. No LOB, supervision for safety.   Stairs Stairs: Yes Stairs assistance: Min guard Stair Management: Forwards;Alternating pattern;One rail Right;No rails (x1 HHA) Number of Stairs: 10 General stair comments: Ascends with reciprocal pattern with L HHA, intermittently grabbing R rail when stepping up with L due to L being weaker than R. Descends with HHA and reciprocal pattern. No LOB.   Wheelchair Mobility    Modified Rankin (Stroke Patients Only)       Balance Overall balance assessment: Needs assistance Sitting-balance support: Feet supported Sitting balance-Leahy Scale: Fair     Standing balance support: Bilateral upper extremity supported;Single extremity supported;No upper extremity supported Standing balance-Leahy Scale: Fair Standing balance comment: Able to stand statically without UE support but needs 1-2 UE support for safe mobility.                            Cognition Arousal/Alertness: Awake/alert Behavior During Therapy:  WFL for tasks assessed/performed Overall Cognitive Status: Within Functional Limits for tasks assessed                                        Exercises      General Comments General comments  (skin integrity, edema, etc.): Educated pt on car transfers and reclining of chairs. Educated pt and wife on safe guarding on stairs and to avoid staying in one position for long periods of time.      Pertinent Vitals/Pain Pain Assessment: Faces Faces Pain Scale: Hurts little more Pain Location: back Pain Descriptors / Indicators: Sore;Grimacing;Operative site guarding Pain Intervention(s): Limited activity within patient's tolerance;Monitored during session;Repositioned    Home Living                      Prior Function            PT Goals (current goals can now be found in the care plan section) Acute Rehab PT Goals Patient Stated Goal: home tomorrow PT Goal Formulation: With patient/family Time For Goal Achievement: 01/20/21 Potential to Achieve Goals: Good Progress towards PT goals: Progressing toward goals    Frequency    Min 5X/week      PT Plan Current plan remains appropriate    Co-evaluation              AM-PAC PT "6 Clicks" Mobility   Outcome Measure  Help needed turning from your back to your side while in a flat bed without using bedrails?: A Little Help needed moving from lying on your back to sitting on the side of a flat bed without using bedrails?: A Little Help needed moving to and from a bed to a chair (including a wheelchair)?: A Little Help needed standing up from a chair using your arms (e.g., wheelchair or bedside chair)?: A Little Help needed to walk in hospital room?: A Little Help needed climbing 3-5 steps with a railing? : A Little 6 Click Score: 18    End of Session Equipment Utilized During Treatment: Back brace Activity Tolerance: Patient tolerated treatment well Patient left: with call bell/phone within reach;in chair;with family/visitor present Nurse Communication: Mobility status (can walk with wife without nursing) PT Visit Diagnosis: Unsteadiness on feet (R26.81);Other abnormalities of gait and mobility  (R26.89);Muscle weakness (generalized) (M62.81);Difficulty in walking, not elsewhere classified (R26.2);History of falling (Z91.81);Pain Pain - part of body:  (back)     Time: 0630-1601 PT Time Calculation (min) (ACUTE ONLY): 24 min  Charges:  $Gait Training: 23-37 mins                     Raymond Gurney, PT, DPT Acute Rehabilitation Services  Pager: [phone] Office: [phone]    Jewel Baize 01/07/2021, 2:44 PM

## 2021-01-07 NOTE — Care Management Important Message (Signed)
Important Message  Patient Details  Name: Xavier [name]. MRN: [id number] Date of Birth: 18-Jun-1965   Medicare Important Message Given:  Yes     Yoshino Broccoli 01/07/2021, 4:07 PM

## 2021-01-07 NOTE — Progress Notes (Signed)
Subjective: 2 Days Post-Op Procedure(s) (LRB): Left L4-5 Gill Decompression (Left) Patient reports pain as mild.   Leg pain improved Tolerating PO without N/V +void +flatus +ambulation Denies CP, SOB, calf pain  Objective: Vital signs in last 24 hours: Temp:  [97.7 F (36.5 C)-99.1 F (37.3 C)] 98.3 F (36.8 C) (08/26 0324) Pulse Rate:  [80-93] 90 (08/26 0324) Resp:  [15-21] 16 (08/26 0324) BP: (115-139)/(68-82) 115/76 (08/26 0324) SpO2:  [92 %-99 %] 95 % (08/26 0324)  Intake/Output from previous day: 08/25 0701 - 08/26 0700 In: 663.8 [P.O.:240; I.V.:423.8] Out: 2450 [Urine:2400; Drains:50] Intake/Output this shift: No intake/output data recorded.  Recent Labs    01/05/21 1120  HGB 14.3   Recent Labs    01/05/21 1120  WBC 6.0  RBC 4.70  HCT 43.0  PLT 389   Recent Labs    01/05/21 1120 01/06/21 0303  NA 135 134*  K 3.5 4.0  CL 100 101  CO2 23 24  BUN 17 15  CREATININE 1.21 1.37*  GLUCOSE 118* 137*  CALCIUM 9.3 8.8*   Recent Labs    01/05/21 1104  INR 0.9    Neurologically intact ABD soft Neurovascular intact Sensation intact distally Intact pulses distally Dorsiflexion/Plantar flexion intact Incision: dressing C/D/I Compartment soft Totoal of 50 cc of serous anguinous drain output over last 24 hours. We will pull this today   Assessment/Plan: 2 Days Post-Op Procedure(s) (LRB): Left L4-5 Gill Decompression (Left) Advance diet Up with therapy We will restart plavix and xarelto aroung 11am, we will monitor him for a few hours after. If he does not show any concerning signs/symptoms then we will plan for discharge home today.      Xavier [name] 01/07/2021, 7:22 AM

## 2021-01-07 NOTE — Progress Notes (Signed)
Pt placed themselves on CPAP. Resting comfortably, RT will cont to monitor.

## 2021-01-08 LAB — GLUCOSE, CAPILLARY: Glucose-Capillary: 154 mg/dL — ABNORMAL HIGH (ref 70–99)

## 2021-01-08 NOTE — Discharge Summary (Signed)
In most cases prophylactic antibiotics for Dental procdeures after total joint surgery are not necessary.  Exceptions are as follows:  1. History of prior total joint infection  2. Severely immunocompromised (Organ Transplant, cancer chemotherapy, Rheumatoid biologic meds such as [place])  3. Poorly controlled diabetes (A1C &gt; 8.0, blood glucose over 200)  If you have one of these conditions, contact your surgeon for an antibiotic prescription, prior to your dental procedure. Orthopedic Discharge Summary        Physician Discharge Summary  Patient ID: Xavier [name]. MRN: [id number] DOB/AGE: 09-23-1965 55 y.o.  Admit date: 01/05/2021 Discharge date: 01/08/2021   Procedures:  Procedure(s) (LRB): Left L4-5 Gill Decompression (Left)  Attending Physician:  Dr. Melina Schools  Admission Diagnoses:   lumbar back pain  Discharge Diagnoses:  Lumbar back pain with spondylolisthesis   Past Medical History:  Diagnosis Date   ANEMIA, PERNICIOUS 04/03/2007   Anxiety and depression    Arthritis    "left hand" (06/26/2016)   Chronic lower back pain    grd I degen spondylolisthesis at L4-5 w/associated synovial cyst at facet at this level +underlying severe epidural lipomatosis L5-S1 up to L4-5--plan for surg w/Dr. Rolena Infante 12/2020.   Chronic renal insufficiency, stage 3 (moderate) ([place]) 2022   GFR around 60   Colon cancer screening 10/27/2016   Cologuard NEG-->rpt 3 yrs   Complication of anesthesia    slow to wake up   CORONARY ARTERY DISEASE 12/05/2006   DIABETES MELLITUS, TYPE II dx'd 2001   DVT (deep venous thrombosis) ([place]) 2016   BLE   GERD (gastroesophageal reflux disease)    History of balanitis    x 2. Holding off on circ as of 05/2020 urol f/u   History of blood transfusion 2001   "when I had my hand OR"   History of gout    History of kidney stones    HYPERCHOLESTEROLEMIA 07/17/2007   HYPERTENSION 12/05/2006   Learning disability    Migraine    "1-2/month"  (06/26/2016)   Myocardial infarction ([place]) ~ 2003   Obesity, Class II, BMI 35-39.9    OSA on CPAP    Pt cannot recall setting clearly but thinks it is 5 cm h20.     PE (pulmonary embolism) 2016   Peripheral vascular disease ([place])    Simple renal cyst    X 2 on R kidney (one 5 cm and one 2 cm)   Stroke ([place]) ~ 2006   denies residual on 06/26/2016   TIA (transient ischemic attack) ?06/25/2016   VISUAL ACUITY, DECREASED, RIGHT EYE 02/19/2009    PCP: Tammi Sou, MD   Discharged Condition: good  Hospital Course:  Patient underwent the above stated procedure on 01/05/2021. Patient tolerated the procedure well and brought to the recovery room in good condition and subsequently to the floor. Patient had an uncomplicated hospital course and was stable for discharge.   Disposition: Discharge disposition: 01-Home or Self Care      with follow up in 2 weeks    [place], Dahari, MD. Schedule an appointment as soon as possible for a visit.   Specialty: Orthopedic Surgery Why: If symptoms worsen, For suture removal, For wound re-check Contact information: [address] [place] [address] [phone]                 Dental Antibiotics:  In most cases prophylactic antibiotics for Dental procdeures after total joint surgery are not  necessary.  Exceptions are as follows:  1. History of prior total joint infection  2. Severely immunocompromised (Organ Transplant, cancer chemotherapy, Rheumatoid biologic meds such as [place])  3. Poorly controlled diabetes (A1C &gt; 8.0, blood glucose over 200)  If you have one of these conditions, contact your surgeon for an antibiotic prescription, prior to your dental procedure.  Discharge Instructions     Call MD / Call 911   Complete by: As directed    If you experience chest pain or shortness of breath, CALL 911 and be transported to the hospital emergency room.  If you develope a  fever above 101 F, pus (white drainage) or increased drainage or redness at the wound, or calf pain, call your surgeon's office.   Constipation Prevention   Complete by: As directed    Drink plenty of fluids.  Prune juice may be helpful.  You may use a stool softener, such as Colace (over the counter) 100 mg twice a day.  Use MiraLax (over the counter) for constipation as needed.   Diet - low sodium heart healthy   Complete by: As directed    Incentive spirometry RT   Complete by: As directed    Increase activity slowly as tolerated   Complete by: As directed    Post-operative opioid taper instructions:   Complete by: As directed    POST-OPERATIVE OPIOID TAPER INSTRUCTIONS: It is important to wean off of your opioid medication as soon as possible. If you do not need pain medication after your surgery it is ok to stop day one. Opioids include: Codeine, Hydrocodone(Norco, Vicodin), Oxycodone(Percocet, oxycontin) and hydromorphone amongst others.  Long term and even short term use of opiods can cause: Increased pain response Dependence Constipation Depression Respiratory depression And more.  Withdrawal symptoms can include Flu like symptoms Nausea, vomiting And more Techniques to manage these symptoms Hydrate well Eat regular healthy meals Stay active Use relaxation techniques(deep breathing, meditating, yoga) Do Not substitute Alcohol to help with tapering If you have been on opioids for less than two weeks and do not have pain than it is ok to stop all together.  Plan to wean off of opioids This plan should start within one week post op of your joint replacement. Maintain the same interval or time between taking each dose and first decrease the dose.  Cut the total daily intake of opioids by one tablet each day Next start to increase the time between doses. The last dose that should be eliminated is the evening dose.          Allergies as of 01/08/2021       Reactions    Actos [pioglitazone] Other (See Comments)   Potential cause of DVT   Bee Venom Anaphylaxis   Klonopin [clonazepam] Anaphylaxis   Invokana [canagliflozin] Nausea Only   Nausea/dizzy/bad taste in mouth   Niacin Other (See Comments)   "makes his crazy"        Medication List     STOP taking these medications    acetaminophen 500 MG tablet Commonly known as: TYLENOL   cyclobenzaprine 10 MG tablet Commonly known as: FLEXERIL   diazepam 10 MG tablet Commonly known as: VALIUM   fluconazole 150 MG tablet Commonly known as: DIFLUCAN   HYDROcodone-acetaminophen 5-325 MG tablet Commonly known as: NORCO/VICODIN   lidocaine 5 % Commonly known as: Lidoderm   nystatin cream Commonly known as: MYCOSTATIN   ondansetron 4 MG disintegrating tablet Commonly known as: Zofran ODT   oxyCODONE  5 MG immediate release tablet Commonly known as: Oxy IR/ROXICODONE       TAKE these medications    Accu-Chek Guide Me w/Device Kit USE TO CHECK BLOOD SUGAR 1 TIME PER DAY. DX CODE: E11.9   Accu-Chek Guide test strip Generic drug: glucose blood check your blood sugar once a day   bromocriptine 2.5 MG tablet Commonly known as: PARLODEL Take 1 tablet (2.5 mg total) by mouth daily.   clopidogrel 75 MG tablet Commonly known as: PLAVIX TAKE 1 TABLET BY MOUTH EVERY DAY   DULoxetine 30 MG capsule Commonly known as: CYMBALTA Take 30 mg by mouth daily.   EPINEPHrine 0.3 mg/0.3 mL Soaj injection Commonly known as: EPI-PEN Inject 0.3 mLs (0.3 mg total) into the muscle as needed for anaphylaxis.   fenofibrate 145 MG tablet Commonly known as: TRICOR TAKE 1 TABLET BY MOUTH EVERY DAY   FLUoxetine 40 MG capsule Commonly known as: PROZAC Take 40 mg by mouth daily.   fluticasone 50 MCG/ACT nasal spray Commonly known as: FLONASE USE ONE SPRAY IN EACH NOSTRIL TWICE A DAY What changed: See the new instructions.   gabapentin 300 MG capsule Commonly known as: NEURONTIN Take 300 mg by  mouth 2 (two) times daily.   hydrochlorothiazide 12.5 MG tablet Commonly known as: HYDRODIURIL TAKE 1 TABLET BY MOUTH EVERY DAY WITH THE LOSARTAN   Jardiance 25 MG Tabs tablet Generic drug: empagliflozin TAKE 1 TABLET BY MOUTH EVERY DAY   losartan 100 MG tablet Commonly known as: COZAAR TAKE 1/2 TABLET BY MOUTH EVERY DAY   metFORMIN 500 MG 24 hr tablet Commonly known as: GLUCOPHAGE-XR TAKE 2 TABLETS BY MOUTH TWICE A DAY   methocarbamol 500 MG tablet Commonly known as: Robaxin Take 1 tablet (500 mg total) by mouth every 8 (eight) hours as needed for up to 5 days for muscle spasms.   nitroGLYCERIN 0.4 MG SL tablet Commonly known as: NITROSTAT Place 1 tablet (0.4 mg total) under the tongue every 5 (five) minutes as needed for chest pain.   ondansetron 4 MG tablet Commonly known as: Zofran Take 1 tablet (4 mg total) by mouth every 8 (eight) hours as needed for nausea or vomiting.   oxyCODONE-acetaminophen 10-325 MG tablet Commonly known as: Percocet Take 1 tablet by mouth every 6 (six) hours as needed for up to 5 days for pain.   Ozempic (2 MG/DOSE) 8 MG/3ML Sopn Generic drug: Semaglutide (2 MG/DOSE) Inject 2 mg into the skin once a week.   rosuvastatin 5 MG tablet Commonly known as: CRESTOR TAKE 1 TABLET BY MOUTH EVERY DAY   Xarelto 20 MG Tabs tablet Generic drug: rivaroxaban TAKE 1 TABLET BY MOUTH EVERY DAY BEFORE SUPPER          Signed: Ventura Bruns 01/08/2021, 8:41 AM  [facility] is now [facility] [address] [place], [place], [place] [address] Phone: [place]

## 2021-01-08 NOTE — Progress Notes (Signed)
Pt self administered home device. Pt resting comfortably. RT will cont to monitor.

## 2021-01-08 NOTE — Progress Notes (Signed)
   Subjective: 3 Days Post-Op Procedure(s) (LRB): Left L4-5 Gill Decompression (Left)  Pt c/o mild soreness but overall doing better Denies any new symptoms or issues overnight Ready for d/c home Patient reports pain as mild.  Objective:   VITALS:   Vitals:   01/07/21 2320 01/08/21 0813  BP: 108/70 132/65  Pulse: 73 75  Resp: 20 18  Temp: 99.6 F (37.6 C) 97.9 F (36.6 C)  SpO2: 91% 97%    Lumbar incision healing well Nv intact distally bilaterally No rashes or edema  LABS Recent Labs    01/05/21 1120  HGB 14.3  HCT 43.0  WBC 6.0  PLT 389    Recent Labs    01/05/21 1120 01/06/21 0303  NA 135 134*  K 3.5 4.0  BUN 17 15  CREATININE 1.21 1.37*  GLUCOSE 118* 137*     Assessment/Plan: 3 Days Post-Op Procedure(s) (LRB): Left L4-5 Gill Decompression (Left) D/c home today F/u in  2 weeks Pain management as needed     Elizebeth Koller, MPAS [facility] is now [facility] [address], [place], [place] [address] Phone: [phone] [email or web address] Facebook  [employer]

## 2021-01-08 NOTE — Progress Notes (Signed)
Discharge instructions reviewed with pt and his wife by unit charge nurse, Rayfield Citizen, RN.  Copy of instructions given to pt.  PA to call in scripts to pt's pharmacy (CN spoke with PA about scripts and confirmed PA will call into pt's pharmacy).  Pt d/c'd via wheelchair with belongings, with wife.             Escorted by unit NT.

## 2021-01-11 ENCOUNTER — Telehealth: Payer: Self-pay | Admitting: *Deleted

## 2021-01-11 NOTE — Telephone Encounter (Signed)
Transition Care Management Unsuccessful Follow-up Telephone Call  Date of discharge and from where:  01-08-2021 Xavier [name]  Attempts:  1st Attempt  Reason for unsuccessful TCM follow-up call:  Left voice message

## 2021-01-13 ENCOUNTER — Telehealth: Payer: Self-pay | Admitting: Endocrinology

## 2021-01-13 NOTE — Telephone Encounter (Signed)
Pt's wife calling in regarding going to the pharmacy and pharmacy stating that there are no ozempic 2mg  at the pharmacy due to being on back order. Pt and pt's wife is wondering if patient can be on the 1mg  and it be sent over to the pharmacy. Pharmacy states that they have the 1mg     [id number] - [place], - [address] 

## 2021-01-14 ENCOUNTER — Other Ambulatory Visit: Payer: Self-pay | Admitting: Endocrinology

## 2021-01-14 MED ORDER — TRULICITY 4.5 MG/0.5ML ~~LOC~~ SOAJ: 4.5000 mg | SUBCUTANEOUS | 3 refills | Status: DC

## 2021-01-14 NOTE — Telephone Encounter (Signed)
Please advise 

## 2021-01-14 NOTE — Telephone Encounter (Signed)
Called and spoke with pt and his wife to inform him that we have sent in the medication to the office

## 2021-01-20 ENCOUNTER — Ambulatory Visit: Payer: Medicare Other | Admitting: Primary Care

## 2021-01-24 ENCOUNTER — Other Ambulatory Visit: Payer: Self-pay

## 2021-01-24 ENCOUNTER — Emergency Department (HOSPITAL_COMMUNITY)
Admission: EM | Admit: 2021-01-24 | Discharge: 2021-01-24 | Disposition: A | Discharge status: Home / Self Care | Payer: Medicare Other | Attending: Emergency Medicine | Admitting: Emergency Medicine

## 2021-01-24 DIAGNOSIS — Z5321 Procedure and treatment not carried out due to patient leaving prior to being seen by health care provider: Secondary | ICD-10-CM | POA: Insufficient documentation

## 2021-01-24 DIAGNOSIS — H9209 Otalgia, unspecified ear: Secondary | ICD-10-CM | POA: Insufficient documentation

## 2021-01-24 DIAGNOSIS — R6884 Jaw pain: Secondary | ICD-10-CM | POA: Insufficient documentation

## 2021-01-24 LAB — COMPREHENSIVE METABOLIC PANEL
ALT: 21 U/L (ref 0–44)
AST: 21 U/L (ref 15–41)
Albumin: 4.1 g/dL (ref 3.5–5.0)
Alkaline Phosphatase: 39 U/L (ref 38–126)
Anion gap: 17 — ABNORMAL HIGH (ref 5–15)
BUN: 16 mg/dL (ref 6–20)
CO2: 22 mmol/L (ref 22–32)
Calcium: 9.6 mg/dL (ref 8.9–10.3)
Chloride: 97 mmol/L — ABNORMAL LOW (ref 98–111)
Creatinine, Ser: 1.16 mg/dL (ref 0.61–1.24)
GFR, Estimated: >60 mL/min (ref >60)
Glucose, Bld: 102 mg/dL — ABNORMAL HIGH (ref 70–99)
Potassium: 4.3 mmol/L (ref 3.5–5.1)
Sodium: 136 mmol/L (ref 135–145)
Total Bilirubin: 0.4 mg/dL (ref 0.3–1.2)
Total Protein: 7.4 g/dL (ref 6.5–8.1)

## 2021-01-24 LAB — CBC WITH DIFFERENTIAL/PLATELET
Abs Immature Granulocytes: 0.12 10*3/uL — ABNORMAL HIGH (ref 0.00–0.07)
Basophils Absolute: 0.1 10*3/uL (ref 0.0–0.1)
Basophils Relative: 1 %
Eosinophils Absolute: 0.2 10*3/uL (ref 0.0–0.5)
Eosinophils Relative: 2 %
HCT: 46.2 % (ref 39.0–52.0)
Hemoglobin: 15 g/dL (ref 13.0–17.0)
Immature Granulocytes: 1 %
Lymphocytes Relative: 24 %
Lymphs Abs: 2.1 10*3/uL (ref 0.7–4.0)
MCH: 30.6 pg (ref 26.0–34.0)
MCHC: 32.5 g/dL (ref 30.0–36.0)
MCV: 94.3 fL (ref 80.0–100.0)
Monocytes Absolute: 0.7 10*3/uL (ref 0.1–1.0)
Monocytes Relative: 8 %
Neutro Abs: 5.5 10*3/uL (ref 1.7–7.7)
Neutrophils Relative %: 64 %
Platelets: 620 10*3/uL — ABNORMAL HIGH (ref 150–400)
RBC: 4.9 MIL/uL (ref 4.22–5.81)
RDW: 13.2 % (ref 11.5–15.5)
WBC: 8.7 10*3/uL (ref 4.0–10.5)
nRBC: 0 % (ref 0.0–0.2)

## 2021-01-24 NOTE — ED Triage Notes (Signed)
Pt arrived POV c/o right sided jaw pain and ear pain Pt states he was recently here for back surgery and was told by Doctor that lock jaw or jaw pain could be a sign of infection and told pt to come to ED today   Pt is now able to speak and move jaw but he is still in pain .  Denies fevers and chills

## 2021-01-24 NOTE — ED Provider Notes (Signed)
Emergency Medicine Provider Triage Evaluation Note  Xavier [name]. , a 55 y.o. male  was evaluated in triage.  Pt complains of jaw pain.  Patient states that he started having lockjaw and intense pain of his jaw starting last night.  This subsided after couple of hours.  But he still has tenderness of his right side of his jaw that shoots up into his ear.  He denies any vision changes.  He called his neurosurgery doctor who did a recent L4-S1 surgery about 3 weeks ago.  They wanted him to come to the emergency room for evaluation for possible infection.    Review of Systems  Positive: Jaw pain, ear pain Negative: Nausea, vomiting, chest pain, shortness of breath, fever, chills, skin changes  Physical Exam  BP 131/88 (BP Location: Right Arm)   Pulse 73   Temp 97.8 F (36.6 C)   Resp 16   Ht 5\' 11"  (1.803 m)   Wt 105.2 kg   SpO2 95%   BMI 32.36 kg/m  Gen:   Awake, no distress   Resp:  Normal effort  MSK:   Moves extremities without difficulty  Other:  Jaw with full range of motion and no pain.  Tenderness to palpation of TMJ joint.  TM clear.  EOMs intact.  Sensation intact.  Medical Decision Making  Medically screening exam initiated at 1:14 PM.  Appropriate orders placed.  . was informed that the remainder of the evaluation will be completed by another provider, this initial triage assessment does not replace that evaluation, and the importance of remaining in the ED until their evaluation is complete.   Xavier Cory, PA-C 01/24/21 1324    03/26/21, DO 01/24/21 1814

## 2021-01-24 NOTE — ED Notes (Signed)
Pt called for vitals; no response.  

## 2021-01-24 NOTE — ED Notes (Signed)
Pt name called x 2 no response  

## 2021-01-25 ENCOUNTER — Encounter (HOSPITAL_BASED_OUTPATIENT_CLINIC_OR_DEPARTMENT_OTHER): Payer: Self-pay | Admitting: Emergency Medicine

## 2021-01-25 ENCOUNTER — Other Ambulatory Visit: Payer: Self-pay

## 2021-01-25 ENCOUNTER — Telehealth: Payer: Self-pay

## 2021-01-25 ENCOUNTER — Emergency Department (HOSPITAL_BASED_OUTPATIENT_CLINIC_OR_DEPARTMENT_OTHER): Payer: Medicare Other | Admitting: Radiology

## 2021-01-25 ENCOUNTER — Emergency Department (HOSPITAL_BASED_OUTPATIENT_CLINIC_OR_DEPARTMENT_OTHER): Payer: Medicare Other

## 2021-01-25 ENCOUNTER — Observation Stay (HOSPITAL_BASED_OUTPATIENT_CLINIC_OR_DEPARTMENT_OTHER)
Admission: EM | Admit: 2021-01-25 | Discharge: 2021-01-27 | Disposition: A | Discharge status: Home / Self Care | Payer: Medicare Other | Attending: Internal Medicine | Admitting: Internal Medicine

## 2021-01-25 DIAGNOSIS — Z7902 Long term (current) use of antithrombotics/antiplatelets: Secondary | ICD-10-CM | POA: Diagnosis not present

## 2021-01-25 DIAGNOSIS — I129 Hypertensive chronic kidney disease with stage 1 through stage 4 chronic kidney disease, or unspecified chronic kidney disease: Secondary | ICD-10-CM | POA: Insufficient documentation

## 2021-01-25 DIAGNOSIS — Z8673 Personal history of transient ischemic attack (TIA), and cerebral infarction without residual deficits: Secondary | ICD-10-CM | POA: Diagnosis not present

## 2021-01-25 DIAGNOSIS — I82409 Acute embolism and thrombosis of unspecified deep veins of unspecified lower extremity: Secondary | ICD-10-CM | POA: Diagnosis not present

## 2021-01-25 DIAGNOSIS — E1122 Type 2 diabetes mellitus with diabetic chronic kidney disease: Secondary | ICD-10-CM | POA: Diagnosis not present

## 2021-01-25 DIAGNOSIS — Z20822 Contact with and (suspected) exposure to covid-19: Secondary | ICD-10-CM | POA: Insufficient documentation

## 2021-01-25 DIAGNOSIS — Z7901 Long term (current) use of anticoagulants: Secondary | ICD-10-CM | POA: Insufficient documentation

## 2021-01-25 DIAGNOSIS — E119 Type 2 diabetes mellitus without complications: Secondary | ICD-10-CM

## 2021-01-25 DIAGNOSIS — E7800 Pure hypercholesterolemia, unspecified: Secondary | ICD-10-CM | POA: Diagnosis present

## 2021-01-25 DIAGNOSIS — Z79899 Other long term (current) drug therapy: Secondary | ICD-10-CM | POA: Diagnosis not present

## 2021-01-25 DIAGNOSIS — Z955 Presence of coronary angioplasty implant and graft: Secondary | ICD-10-CM | POA: Diagnosis not present

## 2021-01-25 DIAGNOSIS — F329 Major depressive disorder, single episode, unspecified: Secondary | ICD-10-CM | POA: Diagnosis present

## 2021-01-25 DIAGNOSIS — Z7984 Long term (current) use of oral hypoglycemic drugs: Secondary | ICD-10-CM | POA: Diagnosis not present

## 2021-01-25 DIAGNOSIS — J9811 Atelectasis: Secondary | ICD-10-CM | POA: Diagnosis not present

## 2021-01-25 DIAGNOSIS — I10 Essential (primary) hypertension: Secondary | ICD-10-CM | POA: Diagnosis present

## 2021-01-25 DIAGNOSIS — Z87891 Personal history of nicotine dependence: Secondary | ICD-10-CM | POA: Diagnosis not present

## 2021-01-25 DIAGNOSIS — F32A Depression, unspecified: Secondary | ICD-10-CM | POA: Diagnosis present

## 2021-01-25 DIAGNOSIS — G459 Transient cerebral ischemic attack, unspecified: Secondary | ICD-10-CM | POA: Diagnosis not present

## 2021-01-25 DIAGNOSIS — Z86718 Personal history of other venous thrombosis and embolism: Secondary | ICD-10-CM

## 2021-01-25 DIAGNOSIS — R42 Dizziness and giddiness: Secondary | ICD-10-CM | POA: Diagnosis not present

## 2021-01-25 DIAGNOSIS — I2510 Atherosclerotic heart disease of native coronary artery without angina pectoris: Secondary | ICD-10-CM | POA: Diagnosis not present

## 2021-01-25 DIAGNOSIS — N183 Chronic kidney disease, stage 3 unspecified: Secondary | ICD-10-CM | POA: Diagnosis not present

## 2021-01-25 DIAGNOSIS — R0600 Dyspnea, unspecified: Secondary | ICD-10-CM | POA: Diagnosis not present

## 2021-01-25 DIAGNOSIS — G4733 Obstructive sleep apnea (adult) (pediatric): Secondary | ICD-10-CM | POA: Diagnosis present

## 2021-01-25 LAB — COMPREHENSIVE METABOLIC PANEL
ALT: 18 U/L (ref 0–44)
AST: 13 U/L — ABNORMAL LOW (ref 15–41)
Albumin: 4.5 g/dL (ref 3.5–5.0)
Alkaline Phosphatase: 35 U/L — ABNORMAL LOW (ref 38–126)
Anion gap: 13 (ref 5–15)
BUN: 17 mg/dL (ref 6–20)
CO2: 24 mmol/L (ref 22–32)
Calcium: 9.9 mg/dL (ref 8.9–10.3)
Chloride: 100 mmol/L (ref 98–111)
Creatinine, Ser: 1.14 mg/dL (ref 0.61–1.24)
GFR, Estimated: >60 mL/min (ref >60)
Glucose, Bld: 139 mg/dL — ABNORMAL HIGH (ref 70–99)
Potassium: 3.6 mmol/L (ref 3.5–5.1)
Sodium: 137 mmol/L (ref 135–145)
Total Bilirubin: 0.3 mg/dL (ref 0.3–1.2)
Total Protein: 7.6 g/dL (ref 6.5–8.1)

## 2021-01-25 LAB — CBC WITH DIFFERENTIAL/PLATELET
Abs Immature Granulocytes: 0.09 10*3/uL — ABNORMAL HIGH (ref 0.00–0.07)
Basophils Absolute: 0.1 10*3/uL (ref 0.0–0.1)
Basophils Relative: 1 %
Eosinophils Absolute: 0.1 10*3/uL (ref 0.0–0.5)
Eosinophils Relative: 2 %
HCT: 42.6 % (ref 39.0–52.0)
Hemoglobin: 14.1 g/dL (ref 13.0–17.0)
Immature Granulocytes: 1 %
Lymphocytes Relative: 20 %
Lymphs Abs: 1.8 10*3/uL (ref 0.7–4.0)
MCH: 30.1 pg (ref 26.0–34.0)
MCHC: 33.1 g/dL (ref 30.0–36.0)
MCV: 90.8 fL (ref 80.0–100.0)
Monocytes Absolute: 0.6 10*3/uL (ref 0.1–1.0)
Monocytes Relative: 6 %
Neutro Abs: 6.3 10*3/uL (ref 1.7–7.7)
Neutrophils Relative %: 70 %
Platelets: 546 10*3/uL — ABNORMAL HIGH (ref 150–400)
RBC: 4.69 MIL/uL (ref 4.22–5.81)
RDW: 13.2 % (ref 11.5–15.5)
WBC: 8.9 10*3/uL (ref 4.0–10.5)
nRBC: 0 % (ref 0.0–0.2)

## 2021-01-25 LAB — D-DIMER, QUANTITATIVE: D-Dimer, Quant: 0.63 ug/mL-FEU — ABNORMAL HIGH (ref 0.00–0.50)

## 2021-01-25 LAB — RESP PANEL BY RT-PCR (FLU A&B, COVID) ARPGX2
Influenza A by PCR: NEGATIVE
Influenza B by PCR: NEGATIVE
SARS Coronavirus 2 by RT PCR: NEGATIVE

## 2021-01-25 LAB — CBG MONITORING, ED: Glucose-Capillary: 114 mg/dL — ABNORMAL HIGH (ref 70–99)

## 2021-01-25 MED ORDER — IOHEXOL 350 MG/ML SOLN
75.0000 mL | Freq: Once | INTRAVENOUS | Status: AC | PRN
  Administered 2021-01-25: 75 mL via INTRAVENOUS

## 2021-01-25 MED ORDER — MECLIZINE HCL 25 MG PO TABS
12.5000 mg | ORAL_TABLET | Freq: Once | ORAL | Status: AC
  Administered 2021-01-25: 12.5 mg via ORAL
  Filled 2021-01-25: qty 1

## 2021-01-25 NOTE — ED Notes (Signed)
Handoff report given to carelink and to floor RN Ashely on 5W at [facility]

## 2021-01-25 NOTE — Telephone Encounter (Signed)
Please review and advise.

## 2021-01-25 NOTE — Telephone Encounter (Signed)
Patient's wife is calling in stating that a few weeks post op from back surgery [place] woke up with a locked jaw, slurred speech and blurry vision. They went to the ED yesterday for the issue but waited over 8 hours and only had blood work done and couldn't wait any longer so they left. They saw the surgeon today who states his back is looking good and that his symptoms had nothing to do with the surgery and advised to follow up with PCP.   No openings this week, can we work patient in tomorrow at 415 or sometime this week?

## 2021-01-25 NOTE — Telephone Encounter (Signed)
I recommend he return to the ED b/c he needs head CT imaging for these sx's. No appt w/me b/c he needs to address this urgently. I don't know where he went and waited so long but I recommend he go to [facility] or [facility] b/c the wait at those locations is generally MUCH shorter.

## 2021-01-25 NOTE — Telephone Encounter (Signed)
Spoke with pt's wife, Lisa(DPR) and advised of recommendations. They will proceed to [facility]

## 2021-01-25 NOTE — ED Provider Notes (Signed)
[facility] EMERGENCY DEPT Provider Note   CSN: [id number] Arrival date & time: 01/25/21  1450     History Chief Complaint  Patient presents with   Dizziness    Xavier [name]. is a 55 y.o. male.  Patient with past medical history of diabetes, hypercholesterolemia, hypertension, pulmonary embolism, DVT, obesity, recent spinal surgery.  Patient presents to the ED with symptoms of dizziness.  He states that on Sunday night he developed symptoms where he could not open his jaw.  At that time he also developed some slurred speech and dizziness.  He was noted to have some word finding difficulties as well.  The symptoms lasted until about Monday morning.  He continued to have right jaw pain right below his ear.  He went to the emergency room at [facility] yesterday afternoon for evaluation.  At that time he was triaged and he ultimately left due to the long wait.  He presents today with continued symptoms of dizziness.  He says that when he turns his head really fast the room starts spinning.  This eventually goes away and is not present constantly.  It is almost always associated with standing up or moving in certain positions.  He denies any ear pain or tinnitus.  Per the family member in his room he has had a couple episodes of word finding difficulty since Sunday.  She also noted that he is having increased shortness of breath.  Patient states that he started having blurry vision that comes in and out since yesterday after he left the emergency department.  Today he was seen by his neurosurgeon to see if this is anything to do with his surgery.  They told him that they do not think it is infectious or related to the surgery but he should follow-up with his PCP.  They called his PCP today and they recommended that he come to the emergency room for evaluation of strokelike symptoms.  Patient denies any chest pain, abdominal pain, nausea, vomiting, diarrhea, congestion, sore throat,  rhinorrhea, fever, chills.   Dizziness Associated symptoms: shortness of breath   Associated symptoms: no chest pain, no diarrhea, no headaches, no hearing loss, no nausea, no palpitations, no tinnitus, no vomiting and no weakness       Past Medical History:  Diagnosis Date   ANEMIA, PERNICIOUS 04/03/2007   Anxiety and depression    Arthritis    "left hand" (06/26/2016)   Chronic lower back pain    grd I degen spondylolisthesis at L4-5 w/associated synovial cyst at facet at this level +underlying severe epidural lipomatosis L5-S1 up to L4-5--plan for surg w/Dr. Rolena Infante 12/2020.   Chronic renal insufficiency, stage 3 (moderate) ([place]) 2022   GFR around 60   Colon cancer screening 10/27/2016   Cologuard NEG-->rpt 3 yrs   Complication of anesthesia    slow to wake up   CORONARY ARTERY DISEASE 12/05/2006   DIABETES MELLITUS, TYPE II dx'd 2001   DVT (deep venous thrombosis) ([place]) 2016   BLE   GERD (gastroesophageal reflux disease)    History of balanitis    x 2. Holding off on circ as of 05/2020 urol f/u   History of blood transfusion 2001   "when I had my hand OR"   History of gout    History of kidney stones    HYPERCHOLESTEROLEMIA 07/17/2007   HYPERTENSION 12/05/2006   Learning disability    Migraine    "1-2/month" (06/26/2016)   Myocardial infarction ([place]) ~ 2003  Obesity, Class II, BMI 35-39.9    OSA on CPAP    Pt cannot recall setting clearly but thinks it is 5 cm h20.     PE (pulmonary embolism) 2016   Peripheral vascular disease ([place])    Simple renal cyst    X 2 on R kidney (one 5 cm and one 2 cm)   Stroke ([place]) ~ 2006   denies residual on 06/26/2016   TIA (transient ischemic attack) ?06/25/2016   VISUAL ACUITY, DECREASED, RIGHT EYE 02/19/2009    Patient Active Problem List   Diagnosis Date Noted   TIA (transient ischemic attack) 01/25/2021   Spinal stenosis at L4-L5 level 01/05/2021   Lumbar radiculopathy 10/15/2020   Shoulder pain, right 09/19/2018   Vitamin B  12 deficiency 01/10/2018   Type 2 diabetes mellitus without complication, without long-term current use of insulin (HCC)    Abnormal dobutamine stress echocardiogram    Chronic back pain 06/15/2016   Encounter for pre-operative respiratory clearance 06/15/2016   Obesity 03/29/2016   OSA (obstructive sleep apnea) 09/06/2015   Hypersomnia with sleep apnea 07/08/2015   RLS (restless legs syndrome) 07/08/2015   Cramp of both lower extremities 06/23/2015   Personal history of DVT (deep vein thrombosis) 06/23/2015   History of snoring 06/23/2015   Excessive somnolence disorder 06/23/2015   Obesity, morbid ([place]) 12/25/2014   Cellulitis 12/17/2013   Amputation, arm/hand, unilateral, traumatic, with complication ([place]) 69/62/9528   Depressive disorder 12/03/2013   Pulmonary embolism ([place]) 11/26/2013   Screening for prostate cancer 09/24/2012   Encounter for long-term (current) use of other medications 09/24/2012   HYPERCHOLESTEROLEMIA 07/17/2007   ANEMIA, PERNICIOUS 04/03/2007   Diabetes ([place]) 12/05/2006   Essential hypertension 12/05/2006   Coronary atherosclerosis 12/05/2006    Past Surgical History:  Procedure Laterality Date   CARDIAC CATHETERIZATION  1990s; 2012; 06/2016   06/2016 distal LAD DES stent.  EF 55-65%   CARDIOVASCULAR STRESS TEST  06/2016   Echo stress test->"inconclusive"-->cath was done after this   CATARACT EXTRACTION W/ INTRAOCULAR LENS IMPLANT Right 2017   CORONARY STENT INTERVENTION N/A 06/27/2016   Procedure: Coronary Stent Intervention;  Surgeon: Leonie Man, MD;  Location: [facility];  Service: Cardiovascular;  Laterality: N/A;   EYE SURGERY Right    Cataract   INGUINAL HERNIA REPAIR Bilateral 1990s   INTRAVASCULAR PRESSURE WIRE/FFR STUDY N/A 06/27/2016   Procedure: Intravascular Pressure Wire/FFR Study;  Surgeon: Leonie Man, MD;  Location: [facility];  Service: Cardiovascular;  Laterality: N/A;   LEFT HEART CATH AND CORONARY ANGIOGRAPHY  N/A 06/27/2016   Procedure: Left Heart Cath and Coronary Angiography;  Surgeon: Leonie Man, MD;  Location: [facility];  Service: Cardiovascular;  Laterality: N/A;   LUMBAR LAMINECTOMY/DECOMPRESSION MICRODISCECTOMY Left 01/05/2021   Procedure: Left L4-5 Gill Decompression;  Surgeon: Melina Schools, MD;  Location: [facility];  Service: Orthopedics;  Laterality: Left;   REATTACHMENT HAND Left ~ 2001   w/carpal tunnel release   UMBILICAL HERNIA REPAIR  1990s   w/IHR       Family History  Problem Relation Age of Onset   COPD Mother    Lymphoma Father     Social History   Tobacco Use   Smoking status: Former    Packs/day: 0.10    Years: 3.00    Pack years: 0.30    Types: Cigarettes    Start date: 2    Quit date: 05/15/1984    Years since quitting: 36.7  Smokeless tobacco: Never  Vaping Use   Vaping Use: Never used  Substance Use Topics   Alcohol use: No   Drug use: No    Home Medications Prior to Admission medications   Medication Sig Start Date End Date Taking? Authorizing Provider  bromocriptine (PARLODEL) 2.5 MG tablet Take 1 tablet (2.5 mg total) by mouth daily. Patient taking differently: Take 1.25 mg by mouth daily. 10/25/20  Yes [name], MD  clopidogrel (PLAVIX) 75 MG tablet TAKE 1 TABLET BY MOUTH EVERY DAY Patient taking differently: Take 75 mg by mouth daily. 11/09/20  Yes Josue Hector, MD  Dulaglutide (TRULICITY) 4.5 ON/6.2XB SOPN Inject 4.5 mg as directed once a week. 01/14/21  Yes [name], MD  DULoxetine (CYMBALTA) 30 MG capsule Take 30 mg by mouth daily.   Yes [provider]  fenofibrate (TRICOR) 145 MG tablet TAKE 1 TABLET BY MOUTH EVERY DAY 11/09/20  Yes McGowen, Adrian Blackwater, MD  FLUoxetine (PROZAC) 40 MG capsule Take 40 mg by mouth daily.   Yes [provider]  gabapentin (NEURONTIN) 300 MG capsule Take 300 mg by mouth 2 (two) times daily. 12/13/20  Yes [provider]  hydrochlorothiazide (HYDRODIURIL) 12.5 MG tablet  TAKE 1 TABLET BY MOUTH EVERY DAY WITH THE LOSARTAN 10/21/20  Yes McGowen, Adrian Blackwater, MD  JARDIANCE 25 MG TABS tablet TAKE 1 TABLET BY MOUTH EVERY DAY 08/17/20  Yes [name], MD  losartan (COZAAR) 100 MG tablet TAKE 1/2 TABLET BY MOUTH EVERY DAY 08/19/20  Yes McGowen, Adrian Blackwater, MD  metFORMIN (GLUCOPHAGE-XR) 500 MG 24 hr tablet TAKE 2 TABLETS BY MOUTH TWICE A DAY 11/10/20  Yes [name], MD  methocarbamol (ROBAXIN) 500 MG tablet Take 500 mg by mouth 2 (two) times daily.   Yes [provider]  nitroGLYCERIN (NITROSTAT) 0.4 MG SL tablet Place 1 tablet (0.4 mg total) under the tongue every 5 (five) minutes as needed for chest pain. 12/02/20  Yes Josue Hector, MD  oxyCODONE-acetaminophen (PERCOCET) 10-325 MG tablet Take 1 tablet by mouth 3 (three) times daily. Only takes twice daily   Yes [provider]  rosuvastatin (CRESTOR) 5 MG tablet TAKE 1 TABLET BY MOUTH EVERY DAY 11/09/20  Yes McGowen, Adrian Blackwater, MD  XARELTO 20 MG TABS tablet TAKE 1 TABLET BY MOUTH EVERY DAY BEFORE SUPPER 08/17/20  Yes Martyn Ehrich, NP  Blood Glucose Monitoring Suppl (ACCU-CHEK GUIDE ME) w/Device KIT USE TO CHECK BLOOD SUGAR 1 TIME PER DAY. DX CODE: E11.9 10/13/20   [name], MD  EPINEPHrine 0.3 mg/0.3 mL IJ SOAJ injection Inject 0.3 mLs (0.3 mg total) into the muscle as needed for anaphylaxis. 01/20/20   McGowen, Adrian Blackwater, MD  fluticasone (FLONASE) 50 MCG/ACT nasal spray USE ONE SPRAY IN EACH NOSTRIL TWICE A DAY Patient taking differently: Place 1 spray into both nostrils daily as needed for allergies. 01/30/18   [name], MD  glucose blood (ACCU-CHEK GUIDE) test strip check your blood sugar once a day 09/26/20   [name], MD  ondansetron (ZOFRAN) 4 MG tablet Take 1 tablet (4 mg total) by mouth every 8 (eight) hours as needed for nausea or vomiting. 01/05/21   Melina Schools, MD    Allergies    Actos [pioglitazone], Bee venom, Klonopin [clonazepam], Invokana [canagliflozin], and Niacin  Review  of Systems   Review of Systems  Constitutional:  Negative for chills and fever.  HENT:  Positive for ear pain. Negative for congestion, ear discharge, hearing loss, rhinorrhea, sore  throat, tinnitus and trouble swallowing.   Eyes:  Positive for visual disturbance.  Respiratory:  Positive for shortness of breath. Negative for cough, chest tightness and wheezing.   Cardiovascular:  Negative for chest pain, palpitations and leg swelling.  Gastrointestinal:  Negative for abdominal pain, constipation, diarrhea, nausea and vomiting.  Genitourinary:  Negative for difficulty urinating.  Musculoskeletal:  Negative for back pain.  Skin:  Negative for rash and wound.  Neurological:  Positive for dizziness and speech difficulty. Negative for syncope, facial asymmetry, weakness, light-headedness, numbness and headaches.  All other systems reviewed and are negative.  Physical Exam Updated Vital Signs BP 127/83 (BP Location: Right Arm)   Pulse 79   Temp 97.6 F (36.4 C)   Resp 12   Ht 6' (1.829 m)   Wt 108.9 kg   SpO2 98%   BMI 32.55 kg/m   Physical Exam Vitals and nursing note reviewed.  Constitutional:      General: He is not in acute distress.    Appearance: Normal appearance. He is obese. He is not ill-appearing, toxic-appearing or diaphoretic.  HENT:     Head: Normocephalic and atraumatic.     Jaw: Tenderness present. No swelling or pain on movement.     Comments: Tenderness at right mandibular joint inferior to right ear. No swelling at site. Patient able to open and close jaw without difficulty. No facial sensation deficit.     Right Ear: No decreased hearing noted. There is impacted cerumen.     Left Ear: Tympanic membrane normal. No decreased hearing noted.     Nose: Nose normal.     Mouth/Throat:     Mouth: Mucous membranes are moist. No oral lesions.     Pharynx: Oropharynx is clear. No oropharyngeal exudate or posterior oropharyngeal erythema.     Tonsils: No tonsillar exudate  or tonsillar abscesses.  Eyes:     General: Gaze aligned appropriately. No visual field deficit or scleral icterus.       Right eye: No discharge.        Left eye: No discharge.     Extraocular Movements: Extraocular movements intact.     Right eye: Normal extraocular motion and no nystagmus.     Left eye: Normal extraocular motion and no nystagmus.     Conjunctiva/sclera: Conjunctivae normal.     Pupils: Pupils are equal, round, and reactive to light.  Cardiovascular:     Rate and Rhythm: Normal rate and regular rhythm.     Pulses:          Radial pulses are 2+ on the right side and 2+ on the left side.       Dorsalis pedis pulses are 2+ on the right side and 2+ on the left side.     Heart sounds: Normal heart sounds, S1 normal and S2 normal. No murmur heard.   No friction rub. No gallop.  Pulmonary:     Effort: No respiratory distress.     Breath sounds: Normal breath sounds. No wheezing, rhonchi or rales.     Comments: Some dyspnea while lying in bed Abdominal:     General: Abdomen is flat. Bowel sounds are normal. There is no distension.     Palpations: Abdomen is soft. There is no pulsatile mass.     Tenderness: There is no abdominal tenderness. There is no guarding or rebound.  Musculoskeletal:     Cervical back: Normal range of motion. No rigidity or tenderness.     Right lower  leg: No edema.     Left lower leg: No edema.  Lymphadenopathy:     Cervical: No cervical adenopathy.  Skin:    General: Skin is warm and dry.     Coloration: Skin is not jaundiced.     Findings: No bruising, erythema, lesion or rash.  Neurological:     General: No focal deficit present.     Mental Status: He is alert and oriented to person, place, and time.     GCS: GCS eye subscore is 4. GCS verbal subscore is 5. GCS motor subscore is 6.     Cranial Nerves: Cranial nerves are intact. No cranial nerve deficit, dysarthria or facial asymmetry.     Sensory: Sensation is intact. No sensory deficit.      Motor: Motor function is intact. No weakness or pronator drift.     Coordination: Coordination is intact.     Gait: Gait is intact.  Psychiatric:        Mood and Affect: Mood normal.        Behavior: Behavior normal.    ED Results / Procedures / Treatments   Labs (all labs ordered are listed, but only abnormal results are displayed) Labs Reviewed  COMPREHENSIVE METABOLIC PANEL - Abnormal; Notable for the following components:      Result Value   Glucose, Bld 139 (*)    AST 13 (*)    Alkaline Phosphatase 35 (*)    All other components within normal limits  CBC WITH DIFFERENTIAL/PLATELET - Abnormal; Notable for the following components:   Platelets 546 (*)    Abs Immature Granulocytes 0.09 (*)    All other components within normal limits  D-DIMER, QUANTITATIVE - Abnormal; Notable for the following components:   D-Dimer, Quant 0.63 (*)    All other components within normal limits  CBG MONITORING, ED - Abnormal; Notable for the following components:   Glucose-Capillary 114 (*)    All other components within normal limits  RESP PANEL BY RT-PCR (FLU A&B, COVID) [id number]    EKG None  Radiology DG Chest 2 View  Result Date: 01/25/2021 CLINICAL DATA:  dyspnea EXAM: CHEST - 2 VIEW COMPARISON:  12/23/2020. FINDINGS: The heart size and mediastinal contours are within normal limits. Both lungs are clear. No visible pleural effusions or pneumothorax. No acute osseous abnormality. IMPRESSION: No active cardiopulmonary disease. Electronically Signed   By: Margaretha Sheffield M.D.   On: 01/25/2021 17:02   CT Head Wo Contrast  Result Date: 01/25/2021 CLINICAL DATA:  Transient ischemic attack (TIA) Dizziness, non-specific EXAM: CT HEAD WITHOUT CONTRAST TECHNIQUE: Contiguous axial images were obtained from the base of the skull through the vertex without intravenous contrast. COMPARISON:  CT head 10/02/2010. FINDINGS: Brain: No evidence of acute large vascular territory infarction, hemorrhage,  hydrocephalus, extra-axial collection or mass lesion/mass effect. Vascular: No hyperdense vessel identified calcific intracranial atherosclerosis. Skull: No acute fracture. Sinuses/Orbits: Clear sinuses. No acute orbital findings. Other: No mastoid effusions. IMPRESSION: No evidence of acute intracranial abnormality. Electronically Signed   By: Margaretha Sheffield M.D.   On: 01/25/2021 17:00   CT Angio Chest PE W/Cm &/Or Wo Cm  Result Date: 01/25/2021 CLINICAL DATA:  dizzies and the inability to form sentences on Sunday. PE suspected, low/intermediate prob, positive D-dimer EXAM: CT ANGIOGRAPHY CHEST WITH CONTRAST TECHNIQUE: Multidetector CT imaging of the chest was performed using the standard protocol during bolus administration of intravenous contrast. Multiplanar CT image reconstructions and MIPs were obtained to evaluate the vascular anatomy. CONTRAST:  3m OMNIPAQUE IOHEXOL 350 MG/ML SOLN COMPARISON:  CT chest 09/16/2020, CT abdomen pelvis 05/27/2006 FINDINGS: Cardiovascular: Satisfactory opacification of the pulmonary arteries to the segmental level. No evidence of pulmonary embolism. Normal heart size. No pericardial effusion. Mild atherosclerotic plaque of the aorta. Left anterior descending coronary artery stent. Mediastinum/Nodes: No enlarged mediastinal, hilar, or axillary lymph nodes. Thyroid gland, trachea, and esophagus demonstrate no significant findings. Lungs/Pleura: No focal consolidation. Bilateral lower lobe subsegmental atelectasis. Stable right lower lobe punctate calcified micronodule likely sequelae of prior granulomatous disease (6:100). No pulmonary mass. No pleural effusion. No pneumothorax. Upper Abdomen: No acute abnormality. Musculoskeletal: No chest wall abnormality. No suspicious lytic or blastic osseous lesions. No acute displaced fracture. Review of the MIP images confirms the above findings. IMPRESSION: 1. No pulmonary embolus. 2. No acute intrathoracic abnormality. 3.  Aortic  Atherosclerosis (ICD10-I70.0). Electronically Signed   By: MIven FinnM.D.   On: 01/25/2021 18:39    Procedures Procedures   Medications Ordered in ED Medications  meclizine (ANTIVERT) tablet 12.5 mg (12.5 mg Oral Given 01/25/21 1634)  iohexol (OMNIPAQUE) 350 MG/ML injection 75 mL (75 mLs Intravenous Contrast Given 01/25/21 1812)    ED Course  I have reviewed the triage vital signs and the nursing notes.  Pertinent labs & imaging results that were available during my care of the patient were reviewed by me and considered in my medical decision making (see chart for details).  Clinical Course as of 01/25/21 2048  Tue Jan 25, 2021  1741 Consult to neurology placed [GL]  [place] SQuinn Axe She agrees with plan to admit patient for TIA workup. Will Page hospitalist for admission.  [GL]  [id number] with Dr. CTonie Griffithwho will accept patient for admission. [GL]    Clinical Course User Index [GL] [place] Dube, GAdora Fridge PA-C   MDM Rules/Calculators/A&P                         This is a well-appearing 55year old male who presents with several different symptoms.  He had a transient episode of lockjaw, word finding difficulties and dysarthria 2 days ago.  He has had new onset vertigo that is started since then. Vertigo seems to be associated with movements.  It is not continuous.  He has had transient blurry vision that comes and goes.  He is also had worsening shortness of breath since Sunday.  He has also had right mandibular joint tenderness since Sunday.  He was seen by his neurosurgeon who did a recent spinal surgery.  They evaluated him and determined that this is nothing to do with his spinal surgery.  He has incision site looks clean dry and intact with no redness or evidence of infection.  He was told by his PCP to come back to the ED for evaluation of strokelike symptoms.    On my exam he has no focal deficits.  He is cranial nerves are all intact.  He has no obvious  dysarthria or aphasia noted while talking to him. Notable findings on exam is point tenderness to his right mandibular joint to palpation.  He has mild shortness of breath while lying flat in the bed.  He is afebrile and vitals are stable.  Spoke with Dr. GPearline Cablesregarding this patient's care.  We will obtain CT head without contrast to evaluate for intracranial abnormality.  Obtain chest x-ray due to shortness of breath.  Since patient has a history of DVTs and pulmonary embolism  along with recent surgery will also collect a D-dimer.  Treating vertigo symptoms with meclizine.  Symptoms somewhat improved with meclizine.  @1757  I discussed this case with Dr. Quinn Axe from neurology.  She says since patient had TIA symptoms 2 days ago he should be admitted under hospitalist service for observation and further work-up of TIA.  He is admitted hospitalist can reconsult neurology for further evaluation.  1839 D-dimer with mild elevation.  CTA chest with no evidence of PE  I consulted with hospitalist at 1917.  I spoke with Dr. Tonie Griffith who agrees to accept patient to service.     Final Clinical Impression(s) / ED Diagnoses Final diagnoses:  TIA (transient ischemic attack)    Rx / DC Orders ED Discharge Orders     None        Adolphus Birchwood, PA-C 01/25/21 2048    Wynona Dove A, DO 01/26/21 0001

## 2021-01-25 NOTE — ED Triage Notes (Signed)
Sunday nighty the pt was awoken with Pain on the right side of his head and his jaw was locked accompanied by slurred speech. Pt went to [facility] yesterday for same symptom at approx 1100. Pt left before completing treatment.   Pt recently had back surgery two week ago.

## 2021-01-25 NOTE — ED Triage Notes (Signed)
Pt from home c/o of  dizzies and the inability to form sentences on Sunday.

## 2021-01-26 ENCOUNTER — Observation Stay (HOSPITAL_COMMUNITY): Payer: Medicare Other

## 2021-01-26 ENCOUNTER — Observation Stay (HOSPITAL_BASED_OUTPATIENT_CLINIC_OR_DEPARTMENT_OTHER): Payer: Medicare Other

## 2021-01-26 DIAGNOSIS — R4781 Slurred speech: Secondary | ICD-10-CM | POA: Diagnosis not present

## 2021-01-26 DIAGNOSIS — G459 Transient cerebral ischemic attack, unspecified: Secondary | ICD-10-CM

## 2021-01-26 DIAGNOSIS — R4789 Other speech disturbances: Secondary | ICD-10-CM | POA: Diagnosis not present

## 2021-01-26 DIAGNOSIS — R42 Dizziness and giddiness: Secondary | ICD-10-CM | POA: Diagnosis not present

## 2021-01-26 DIAGNOSIS — M2653 Deviation in opening and closing of the mandible: Secondary | ICD-10-CM | POA: Diagnosis not present

## 2021-01-26 LAB — BASIC METABOLIC PANEL
Anion gap: 9 (ref 5–15)
BUN: 17 mg/dL (ref 6–20)
CO2: 24 mmol/L (ref 22–32)
Calcium: 9.4 mg/dL (ref 8.9–10.3)
Chloride: 101 mmol/L (ref 98–111)
Creatinine, Ser: 1.06 mg/dL (ref 0.61–1.24)
GFR, Estimated: >60 mL/min (ref >60)
Glucose, Bld: 114 mg/dL — ABNORMAL HIGH (ref 70–99)
Potassium: 3.4 mmol/L — ABNORMAL LOW (ref 3.5–5.1)
Sodium: 134 mmol/L — ABNORMAL LOW (ref 135–145)

## 2021-01-26 LAB — CBC
HCT: 39.5 % (ref 39.0–52.0)
Hemoglobin: 13.6 g/dL (ref 13.0–17.0)
MCH: 31.1 pg (ref 26.0–34.0)
MCHC: 34.4 g/dL (ref 30.0–36.0)
MCV: 90.4 fL (ref 80.0–100.0)
Platelets: 465 K/uL — ABNORMAL HIGH (ref 150–400)
RBC: 4.37 MIL/uL (ref 4.22–5.81)
RDW: 13.1 % (ref 11.5–15.5)
WBC: 7.5 K/uL (ref 4.0–10.5)
nRBC: 0 % (ref 0.0–0.2)

## 2021-01-26 LAB — ECHOCARDIOGRAM COMPLETE
AR max vel: 2.16 cm2
AV Area VTI: 2.04 cm2
AV Area mean vel: 2.2 cm2
AV Mean grad: 2 mmHg
AV Peak grad: 3.9 mmHg
Ao pk vel: 0.99 m/s
Area-P 1/2: 1.88 cm2
Height: 71 in
S' Lateral: 3 cm
Single Plane A4C EF: 59.6 %
Weight: 3830.71 oz

## 2021-01-26 LAB — PROTIME-INR
INR: 1.8 — ABNORMAL HIGH (ref 0.8–1.2)
Prothrombin Time: 21 seconds — ABNORMAL HIGH (ref 11.4–15.2)

## 2021-01-26 LAB — C-REACTIVE PROTEIN: CRP: 0.7 mg/dL (ref <1.0)

## 2021-01-26 LAB — LIPID PANEL
Cholesterol: 132 mg/dL (ref 0–200)
HDL: 53 mg/dL
LDL Cholesterol: 47 mg/dL (ref 0–99)
Total CHOL/HDL Ratio: 2.5 ratio
Triglycerides: 159 mg/dL — ABNORMAL HIGH
VLDL: 32 mg/dL (ref 0–40)

## 2021-01-26 LAB — HIV ANTIBODY (ROUTINE TESTING W REFLEX): HIV Screen 4th Generation wRfx: NONREACTIVE

## 2021-01-26 LAB — GLUCOSE, CAPILLARY
Glucose-Capillary: 113 mg/dL — ABNORMAL HIGH (ref 70–99)
Glucose-Capillary: 173 mg/dL — ABNORMAL HIGH (ref 70–99)
Glucose-Capillary: 168 mg/dL — ABNORMAL HIGH (ref 70–99)
Glucose-Capillary: 180 mg/dL — ABNORMAL HIGH (ref 70–99)

## 2021-01-26 LAB — HEMOGLOBIN A1C
Hgb A1c MFr Bld: 6.6 % — ABNORMAL HIGH (ref 4.8–5.6)
Mean Plasma Glucose: 142.72 mg/dL

## 2021-01-26 LAB — VITAMIN B12: Vitamin B-12: 140 pg/mL — ABNORMAL LOW (ref 180–914)

## 2021-01-26 LAB — SEDIMENTATION RATE: Sed Rate: 15 mm/hr (ref 0–16)

## 2021-01-26 MED ORDER — VITAMIN B-12 1000 MCG PO TABS: 1000.0000 ug | ORAL_TABLET | Freq: Two times a day (BID) | ORAL | Status: DC

## 2021-01-26 MED ORDER — HYDROCHLOROTHIAZIDE 12.5 MG PO CAPS
12.5000 mg | ORAL_CAPSULE | Freq: Every day | ORAL | Status: DC
  Administered 2021-01-26: 12.5 mg via ORAL
  Filled 2021-01-26: qty 1

## 2021-01-26 MED ORDER — GADOBUTROL 1 MMOL/ML IV SOLN
10.0000 mL | Freq: Once | INTRAVENOUS | Status: AC | PRN
  Administered 2021-01-26: 10 mL via INTRAVENOUS

## 2021-01-26 MED ORDER — ROSUVASTATIN CALCIUM 5 MG PO TABS
5.0000 mg | ORAL_TABLET | Freq: Every day | ORAL | Status: DC
  Administered 2021-01-26: 5 mg via ORAL
  Filled 2021-01-26: qty 1

## 2021-01-26 MED ORDER — ACETAMINOPHEN 160 MG/5ML PO SOLN: 650.0000 mg | ORAL | Status: DC | PRN

## 2021-01-26 MED ORDER — OXYCODONE HCL 5 MG PO TABS
5.0000 mg | ORAL_TABLET | Freq: Four times a day (QID) | ORAL | Status: DC | PRN
  Administered 2021-01-26 (×2): 5 mg via ORAL
  Filled 2021-01-26 (×2): qty 1

## 2021-01-26 MED ORDER — CLOPIDOGREL BISULFATE 75 MG PO TABS
75.0000 mg | ORAL_TABLET | Freq: Every day | ORAL | Status: DC
  Administered 2021-01-26: 75 mg via ORAL
  Filled 2021-01-26: qty 1

## 2021-01-26 MED ORDER — DULOXETINE HCL 30 MG PO CPEP
30.0000 mg | ORAL_CAPSULE | Freq: Every day | ORAL | Status: DC
  Administered 2021-01-26: 30 mg via ORAL
  Filled 2021-01-26: qty 1

## 2021-01-26 MED ORDER — RIVAROXABAN 20 MG PO TABS
20.0000 mg | ORAL_TABLET | Freq: Every day | ORAL | Status: DC
  Administered 2021-01-26 (×2): 20 mg via ORAL
  Filled 2021-01-26 (×2): qty 1

## 2021-01-26 MED ORDER — ACETAMINOPHEN 650 MG RE SUPP: 650.0000 mg | RECTAL | Status: DC | PRN

## 2021-01-26 MED ORDER — ACETAMINOPHEN 325 MG PO TABS: 650.0000 mg | ORAL_TABLET | ORAL | Status: DC | PRN

## 2021-01-26 MED ORDER — POTASSIUM CHLORIDE 20 MEQ PO PACK
40.0000 meq | PACK | Freq: Once | ORAL | Status: AC
  Administered 2021-01-26: 40 meq via ORAL
  Filled 2021-01-26: qty 2

## 2021-01-26 MED ORDER — FLUOXETINE HCL 20 MG PO CAPS
40.0000 mg | ORAL_CAPSULE | Freq: Every day | ORAL | Status: DC
  Administered 2021-01-26: 40 mg via ORAL
  Filled 2021-01-26: qty 2

## 2021-01-26 MED ORDER — STROKE: EARLY STAGES OF RECOVERY BOOK
Freq: Once | Status: AC
  Filled 2021-01-26: qty 1

## 2021-01-26 MED ORDER — GABAPENTIN 300 MG PO CAPS
300.0000 mg | ORAL_CAPSULE | Freq: Two times a day (BID) | ORAL | Status: DC
  Administered 2021-01-26 (×3): 300 mg via ORAL
  Filled 2021-01-26 (×3): qty 1

## 2021-01-26 MED ORDER — LOSARTAN POTASSIUM 50 MG PO TABS
50.0000 mg | ORAL_TABLET | Freq: Every day | ORAL | Status: DC
  Administered 2021-01-26: 50 mg via ORAL
  Filled 2021-01-26: qty 1

## 2021-01-26 MED ORDER — INSULIN ASPART 100 UNIT/ML IJ SOLN
0.0000 [IU] | Freq: Three times a day (TID) | INTRAMUSCULAR | Status: DC
  Administered 2021-01-26 (×2): 1 [IU] via SUBCUTANEOUS

## 2021-01-26 MED ORDER — CYANOCOBALAMIN 1000 MCG/ML IJ SOLN
1000.0000 ug | Freq: Every day | INTRAMUSCULAR | Status: DC
  Administered 2021-01-26: 1000 ug via SUBCUTANEOUS
  Filled 2021-01-26 (×2): qty 1

## 2021-01-26 MED ORDER — ASPIRIN EC 81 MG PO TBEC
81.0000 mg | DELAYED_RELEASE_TABLET | Freq: Once | ORAL | Status: AC
  Administered 2021-01-26: 81 mg via ORAL
  Filled 2021-01-26: qty 1

## 2021-01-26 NOTE — Progress Notes (Addendum)
STROKE TEAM PROGRESS NOTE   INTERVAL HISTORY 55 y.o. male with a pertinent history of  DM, HLD, HTN, pulmonary embolism, DVT on xarelto, obesity, OSA on cpap, CAD s/p stents still on plavix, recent spinal surgery on 8/24 and has been on muscle relaxers and some narcotics, at home he developed some slurred speech a few days ago along with some jaw pain and locked jaw for a few hours.  Came to the ER where initial head CT was negative and he was admitted for further stroke/TIA work-up. MRI brain is unremarkable MRA head and neck without flow limiting stenoses.   At home he is on plavix and xarelto. He has cardiac stent, prior stroke without residual deficit.   Presented with transient episode of expressive language difficulties and word finding was resolved.  The next day he had severe pain in the right side of his jaw with twisting of the mouth and inability to open his mouth.  He denies any ongoing tooth infection or previous problems with TM joint dysfunction. His wife is at the bedside.     Vitals:   01/26/21 0400 01/26/21 0740 01/26/21 0957 01/26/21 1159  BP: 117/84 127/87 (!) 136/91 106/81  Pulse: 73 68 82 77  Resp: 14 16 14 20   Temp: 97.8 F (36.6 C) 97.6 F (36.4 C) 97.8 F (36.6 C) 97.6 F (36.4 C)  TempSrc: Oral Oral Oral Oral  SpO2: 95% 96% 95% 93%  Weight:      Height:       CBC:  Recent Labs  Lab 01/24/21 1323 01/25/21 1630 01/26/21 0639  WBC 8.7 8.9 7.5  NEUTROABS 5.5 6.3  --   HGB 15.0 14.1 13.6  HCT 46.2 42.6 39.5  MCV 94.3 90.8 90.4  PLT 620* 546* 465*   Basic Metabolic Panel:  Recent Labs  Lab 01/25/21 1630 01/26/21 0639  NA 137 134*  K 3.6 3.4*  CL 100 101  CO2 24 24  GLUCOSE 139* 114*  BUN 17 17  CREATININE 1.14 1.06  CALCIUM 9.9 9.4    Lipid Panel:  Recent Labs  Lab 01/26/21 0639  CHOL 132  TRIG 159*  HDL 53  CHOLHDL 2.5  VLDL 32  LDLCALC 47    HgbA1c:  Recent Labs  Lab 01/26/21 0639  HGBA1C 6.6*   Urine Drug Screen: No results  for input(s): LABOPIA, COCAINSCRNUR, LABBENZ, AMPHETMU, THCU, LABBARB in the last 168 hours.  Alcohol Level No results for input(s): ETH in the last 168 hours.  IMAGING past 24 hours DG Chest 2 View  Result Date: 01/25/2021 CLINICAL DATA:  dyspnea EXAM: CHEST - 2 VIEW COMPARISON:  12/23/2020. FINDINGS: The heart size and mediastinal contours are within normal limits. Both lungs are clear. No visible pleural effusions or pneumothorax. No acute osseous abnormality. IMPRESSION: No active cardiopulmonary disease. Electronically Signed   By: 02/22/2021 M.D.   On: 01/25/2021 17:02   CT Head Wo Contrast  Result Date: 01/25/2021 CLINICAL DATA:  Transient ischemic attack (TIA) Dizziness, non-specific EXAM: CT HEAD WITHOUT CONTRAST TECHNIQUE: Contiguous axial images were obtained from the base of the skull through the vertex without intravenous contrast. COMPARISON:  CT head 10/02/2010. FINDINGS: Brain: No evidence of acute large vascular territory infarction, hemorrhage, hydrocephalus, extra-axial collection or mass lesion/mass effect. Vascular: No hyperdense vessel identified calcific intracranial atherosclerosis. Skull: No acute fracture. Sinuses/Orbits: Clear sinuses. No acute orbital findings. Other: No mastoid effusions. IMPRESSION: No evidence of acute intracranial abnormality. Electronically Signed   By: 10/04/2010  Yetta Barre M.D.   On: 01/25/2021 17:00   CT Angio Chest PE W/Cm &/Or Wo Cm  Result Date: 01/25/2021 CLINICAL DATA:  dizzies and the inability to form sentences on Sunday. PE suspected, low/intermediate prob, positive D-dimer EXAM: CT ANGIOGRAPHY CHEST WITH CONTRAST TECHNIQUE: Multidetector CT imaging of the chest was performed using the standard protocol during bolus administration of intravenous contrast. Multiplanar CT image reconstructions and MIPs were obtained to evaluate the vascular anatomy. CONTRAST:  75mL OMNIPAQUE IOHEXOL 350 MG/ML SOLN COMPARISON:  CT chest 09/16/2020, CT abdomen  pelvis 05/27/2006 FINDINGS: Cardiovascular: Satisfactory opacification of the pulmonary arteries to the segmental level. No evidence of pulmonary embolism. Normal heart size. No pericardial effusion. Mild atherosclerotic plaque of the aorta. Left anterior descending coronary artery stent. Mediastinum/Nodes: No enlarged mediastinal, hilar, or axillary lymph nodes. Thyroid gland, trachea, and esophagus demonstrate no significant findings. Lungs/Pleura: No focal consolidation. Bilateral lower lobe subsegmental atelectasis. Stable right lower lobe punctate calcified micronodule likely sequelae of prior granulomatous disease (6:100). No pulmonary mass. No pleural effusion. No pneumothorax. Upper Abdomen: No acute abnormality. Musculoskeletal: No chest wall abnormality. No suspicious lytic or blastic osseous lesions. No acute displaced fracture. Review of the MIP images confirms the above findings. IMPRESSION: 1. No pulmonary embolus. 2. No acute intrathoracic abnormality. 3.  Aortic Atherosclerosis (ICD10-I70.0). Electronically Signed   By: Tish Frederickson M.D.   On: 01/25/2021 18:39   MR ANGIO HEAD WO CONTRAST  Result Date: 01/26/2021 CLINICAL DATA:  Difficulty opening jaw with slurred speech and dizziness EXAM: MRI HEAD WITHOUT CONTRAST MRA HEAD WITHOUT CONTRAST MRA OF THE NECK WITHOUT AND WITH CONTRAST TECHNIQUE: Multiplanar, multi-echo pulse sequences of the brain and surrounding structures were acquired without intravenous contrast. Angiographic images of the Circle of Willis were acquired using MRA technique without intravenous contrast. Angiographic images of the neck were acquired using MRA technique without and with intravenous contrast. Carotid stenosis measurements (when applicable) are obtained utilizing NASCET criteria, using the distal internal carotid diameter as the denominator. CONTRAST:  10mL GADAVIST GADOBUTROL 1 MMOL/ML IV SOLN COMPARISON:  Head CT from yesterday FINDINGS: MR HEAD FINDINGS Brain:  No acute infarction, hemorrhage, hydrocephalus, extra-axial collection or mass lesion. No generalized white matter disease or atrophy Vascular: Normal flow voids. Skull and upper cervical spine: Normal marrow signal. Sinuses/Orbits: Right cataract resection. Essentially clear paranasal sinuses. MRA HEAD FINDINGS Anterior circulation: Vessels are smooth and widely patent. Negative for aneurysm Posterior circulation: Vessels are smooth and widely patent. Negative for aneurysm. Anatomic variants:Fetal type left PCA. MRA NECK FINDINGS Aortic arch: No proximal subclavian stenosis Right carotid system: ICA tortuosity with looping. No beading, stenosis, or ulceration. Left carotid system: ICA tortuosity with looping. No beading, stenosis, or ulceration. Vertebral arteries: The subclavian vessels are smoothly contoured and widely patent. The right vertebral artery is strongly dominant. No vertebral stenosis or convincing beading in the neck. The left V4 segment essentially ends in the PICA with possible stenosis near the PICA origin. IMPRESSION: Brain MRI: Normal. Intracranial MRA: Negative. Neck MRA: 1. Tortuous vessels without flow limiting stenosis or ulceration in the neck. 2. Equivocal stenosis of the non dominant left V4 segment near the PICA origin. Electronically Signed   By: Tiburcio Pea M.D.   On: 01/26/2021 07:22   MR MRA NECK W CONTRAST  Result Date: 01/26/2021 CLINICAL DATA:  Difficulty opening jaw with slurred speech and dizziness EXAM: MRI HEAD WITHOUT CONTRAST MRA HEAD WITHOUT CONTRAST MRA OF THE NECK WITHOUT AND WITH CONTRAST TECHNIQUE: Multiplanar, multi-echo pulse sequences of  the brain and surrounding structures were acquired without intravenous contrast. Angiographic images of the Circle of Willis were acquired using MRA technique without intravenous contrast. Angiographic images of the neck were acquired using MRA technique without and with intravenous contrast. Carotid stenosis measurements  (when applicable) are obtained utilizing NASCET criteria, using the distal internal carotid diameter as the denominator. CONTRAST:  6mL GADAVIST GADOBUTROL 1 MMOL/ML IV SOLN COMPARISON:  Head CT from yesterday FINDINGS: MR HEAD FINDINGS Brain: No acute infarction, hemorrhage, hydrocephalus, extra-axial collection or mass lesion. No generalized white matter disease or atrophy Vascular: Normal flow voids. Skull and upper cervical spine: Normal marrow signal. Sinuses/Orbits: Right cataract resection. Essentially clear paranasal sinuses. MRA HEAD FINDINGS Anterior circulation: Vessels are smooth and widely patent. Negative for aneurysm Posterior circulation: Vessels are smooth and widely patent. Negative for aneurysm. Anatomic variants:Fetal type left PCA. MRA NECK FINDINGS Aortic arch: No proximal subclavian stenosis Right carotid system: ICA tortuosity with looping. No beading, stenosis, or ulceration. Left carotid system: ICA tortuosity with looping. No beading, stenosis, or ulceration. Vertebral arteries: The subclavian vessels are smoothly contoured and widely patent. The right vertebral artery is strongly dominant. No vertebral stenosis or convincing beading in the neck. The left V4 segment essentially ends in the PICA with possible stenosis near the PICA origin. IMPRESSION: Brain MRI: Normal. Intracranial MRA: Negative. Neck MRA: 1. Tortuous vessels without flow limiting stenosis or ulceration in the neck. 2. Equivocal stenosis of the non dominant left V4 segment near the PICA origin. Electronically Signed   By: Tiburcio Pea M.D.   On: 01/26/2021 07:22   MR BRAIN WO CONTRAST  Result Date: 01/26/2021 CLINICAL DATA:  Difficulty opening jaw with slurred speech and dizziness EXAM: MRI HEAD WITHOUT CONTRAST MRA HEAD WITHOUT CONTRAST MRA OF THE NECK WITHOUT AND WITH CONTRAST TECHNIQUE: Multiplanar, multi-echo pulse sequences of the brain and surrounding structures were acquired without intravenous contrast.  Angiographic images of the Circle of Willis were acquired using MRA technique without intravenous contrast. Angiographic images of the neck were acquired using MRA technique without and with intravenous contrast. Carotid stenosis measurements (when applicable) are obtained utilizing NASCET criteria, using the distal internal carotid diameter as the denominator. CONTRAST:  25mL GADAVIST GADOBUTROL 1 MMOL/ML IV SOLN COMPARISON:  Head CT from yesterday FINDINGS: MR HEAD FINDINGS Brain: No acute infarction, hemorrhage, hydrocephalus, extra-axial collection or mass lesion. No generalized white matter disease or atrophy Vascular: Normal flow voids. Skull and upper cervical spine: Normal marrow signal. Sinuses/Orbits: Right cataract resection. Essentially clear paranasal sinuses. MRA HEAD FINDINGS Anterior circulation: Vessels are smooth and widely patent. Negative for aneurysm Posterior circulation: Vessels are smooth and widely patent. Negative for aneurysm. Anatomic variants:Fetal type left PCA. MRA NECK FINDINGS Aortic arch: No proximal subclavian stenosis Right carotid system: ICA tortuosity with looping. No beading, stenosis, or ulceration. Left carotid system: ICA tortuosity with looping. No beading, stenosis, or ulceration. Vertebral arteries: The subclavian vessels are smoothly contoured and widely patent. The right vertebral artery is strongly dominant. No vertebral stenosis or convincing beading in the neck. The left V4 segment essentially ends in the PICA with possible stenosis near the PICA origin. IMPRESSION: Brain MRI: Normal. Intracranial MRA: Negative. Neck MRA: 1. Tortuous vessels without flow limiting stenosis or ulceration in the neck. 2. Equivocal stenosis of the non dominant left V4 segment near the PICA origin. Electronically Signed   By: Tiburcio Pea M.D.   On: 01/26/2021 07:22   ECHOCARDIOGRAM COMPLETE  Result Date: 01/26/2021    ECHOCARDIOGRAM  REPORT   Patient Name:   Xavier [name]. Date  of Exam: 01/26/2021 Medical Rec #:  [id number]         Height:       71.0 in Accession #:    [id number]        Weight:       239.4 lb Date of Birth:  1965/12/22         BSA:          2.276 m Patient Age:    55 years          BP:           127/87 mmHg Patient Gender: M                 HR:           67 bpm. Exam Location:  Inpatient Procedure: 2D Echo, Cardiac Doppler and Color Doppler Indications:    TIA  History:        Patient has prior history of Echocardiogram examinations, most                 recent 11/26/2013. Risk Factors:Diabetes and Hypertension.  Sonographer:    MH Referring Phys: [id number] Charlane Ferretti IMPRESSIONS  1. Left ventricular ejection fraction, by estimation, is 60 to 65%. The left ventricle has normal function. The left ventricle has no regional wall motion abnormalities. There is moderate left ventricular hypertrophy. Left ventricular diastolic parameters are consistent with Grade I diastolic dysfunction (impaired relaxation).  2. Right ventricular systolic function is normal. The right ventricular size is normal. There is normal pulmonary artery systolic pressure.  3. The mitral valve is normal in structure. No evidence of mitral valve regurgitation. No evidence of mitral stenosis.  4. The aortic valve is tricuspid. Aortic valve regurgitation is not visualized. No aortic stenosis is present.  5. The inferior vena cava is normal in size with greater than 50% respiratory variability, suggesting right atrial pressure of 3 mmHg. FINDINGS  Left Ventricle: Left ventricular ejection fraction, by estimation, is 60 to 65%. The left ventricle has normal function. The left ventricle has no regional wall motion abnormalities. The left ventricular internal cavity size was normal in size. There is  moderate left ventricular hypertrophy. Left ventricular diastolic parameters are consistent with Grade I diastolic dysfunction (impaired relaxation). Right Ventricle: The right ventricular size is normal. No increase  in right ventricular wall thickness. Right ventricular systolic function is normal. There is normal pulmonary artery systolic pressure. The tricuspid regurgitant velocity is 1.37 m/s, and  with an assumed right atrial pressure of 3 mmHg, the estimated right ventricular systolic pressure is 10.5 mmHg. Left Atrium: Left atrial size was normal in size. Right Atrium: Right atrial size was normal in size. Pericardium: There is no evidence of pericardial effusion. Mitral Valve: The mitral valve is normal in structure. No evidence of mitral valve regurgitation. No evidence of mitral valve stenosis. Tricuspid Valve: The tricuspid valve is normal in structure. Tricuspid valve regurgitation is trivial. No evidence of tricuspid stenosis. Aortic Valve: The aortic valve is tricuspid. Aortic valve regurgitation is not visualized. No aortic stenosis is present. Aortic valve mean gradient measures 2.0 mmHg. Aortic valve peak gradient measures 3.9 mmHg. Aortic valve area, by VTI measures 2.04 cm. Pulmonic Valve: The pulmonic valve was normal in structure. Pulmonic valve regurgitation is not visualized. No evidence of pulmonic stenosis. Aorta: The aortic root is normal in size and structure. Venous: The inferior vena cava is normal  in size with greater than 50% respiratory variability, suggesting right atrial pressure of 3 mmHg. IAS/Shunts: No atrial level shunt detected by color flow Doppler.  LEFT VENTRICLE PLAX 2D LVIDd:         4.60 cm     Diastology LVIDs:         3.00 cm     LV e' medial:    12.20 cm/s LV PW:         1.24 cm     LV E/e' medial:  6.3 LV IVS:        1.10 cm     LV e' lateral:   6.53 cm/s LVOT diam:     2.00 cm     LV E/e' lateral: 11.8 LV SV:         45 LV SV Index:   20 LVOT Area:     3.14 cm  LV Volumes (MOD) LV vol d, MOD A4C: 59.6 ml LV vol s, MOD A4C: 24.1 ml LV SV MOD A4C:     59.6 ml RIGHT VENTRICLE             IVC RV S prime:     12.80 cm/s  IVC diam: 1.80 cm TAPSE (M-mode): 2.4 cm LEFT ATRIUM              Index       RIGHT ATRIUM          Index LA diam:        2.40 cm 1.05 cm/m  RA Area:     8.55 cm LA Vol (A2C):   26.3 ml 11.55 ml/m RA Volume:   13.60 ml 5.98 ml/m LA Vol (A4C):   30.4 ml 13.36 ml/m LA Biplane Vol: 29.7 ml 13.05 ml/m  AORTIC VALVE                   PULMONIC VALVE AV Area (Vmax):    2.16 cm    PV Vmax:       0.58 m/s AV Area (Vmean):   2.20 cm    PV Peak grad:  1.4 mmHg AV Area (VTI):     2.04 cm AV Vmax:           99.00 cm/s AV Vmean:          74.800 cm/s AV VTI:            0.220 m AV Peak Grad:      3.9 mmHg AV Mean Grad:      2.0 mmHg LVOT Vmax:         68.00 cm/s LVOT Vmean:        52.400 cm/s LVOT VTI:          0.143 m LVOT/AV VTI ratio: 0.65  AORTA Ao Root diam: 3.30 cm Ao Asc diam:  3.40 cm MITRAL VALVE               TRICUSPID VALVE MV Area (PHT): 1.88 cm    TR Peak grad:   7.5 mmHg MV Decel Time: 404 msec    TR Vmax:        137.00 cm/s MV E velocity: 77.10 cm/s MV A velocity: 83.90 cm/s  SHUNTS MV E/A ratio:  0.92        Systemic VTI:  0.14 m                            Systemic Diam: 2.00 cm Tiffany  Duke Salvia MD Electronically signed by Chilton Si MD Signature Date/Time: 01/26/2021/1:14:24 PM    Final     PHYSICAL EXAM  Neuro: Mental Status: Patient is awake, alert, oriented to person, place, month, year, and situation. He denies any current jaw pain.  Patient is able to give a clear and coherent history. No signs of aphasia or neglect Cranial Nerves: II: Visual Fields are full. Pupils are equal, round, and reactive to light.   III,IV, VI: EOMI without ptosis or diploplia.  V: Facial sensation is symmetric to temperature VII: Facial movement is symmetric.  VIII: hearing is intact to voice X: Uvula elevates symmetrically XI: Shoulder shrug is symmetric. XII: tongue is midline without atrophy or fasciculations.  Motor: Tone is normal. Bulk is normal. 5/5 strength was present in all four extremities, other than some loss of muscle bulk in the ulnar nerve  distribution on the left upper extremity Sensory: Sensation is symmetric to light touch and temperature in the arms and legs --reduced in the hand at the ulnar nerve distribution Deep Tendon Reflexes: 2+ and symmetric in the biceps and patellae.  Cerebellar: FNF and HKS are intact bilaterally  ASSESSMENT/PLAN [name]. is a 55 y.o. male with history of  recent lumbar spine fusion, type 2 diabetes, hypertension, hyperlipidemia, obesity (33.39 BMI), OSA on BiPAP, prior stroke without residual deficit, coronary artery disease s/p stent decreased right eye visual acuity.   He reports he was in his usual state of health on Sunday when he had some difficulty speaking with his family, mixing up his words a bit for example being unable to get the syllables of the word muscle out in the right order.  The next day he woke up with jaw pain in the right TMJ, which was clenched shut and swollen.  He came in for evaluation of this to the ED but had a severe headache while there (one of his typical migraines, not the worst headache he has ever had in his life).  Due to the wait times he decided to return home and did have improvement in his jaw issue with muscle relaxers a pain pill and positional support.  He denies jaw claudication, transient loss of vision, proximal muscle weakness or any other numbness or focal neurological deficits, other than some light ringing in his ears bilaterally.   Regarding his headache history, he reports migraines happening rarely (once every few months typically).  He notes he had some bloody discharge from his ear back in February on the right side.  He did not have any visual symptoms other than blurry vision while waiting in the ED.  He additionally has ringing in his ears that has been ongoing for at least a few days.  He endorses compliance with all of his medications    Left brain TIA:   Likely cardioembolic but given the fact that he is already on long-term  anticoagulation for his PE/DVT will not pursue prolonged cardiac monitoring Transient right-sided jaw pain, spasm and discomfort of unclear etiology patient will need outpatient follow-up with dentist/ENT Code Stroke  CT head No acute abnormality.  Small vessel disease. Atrophy.  ASPECTS 10.     MRI   No evidence of acute intracranial abnormality. MRA  Negative.   2D Echo LVEF 60-65%, normal sized LA, no IA shunt   LDL 47 HgbA1c 6.6 VTE prophylaxis - scd    Diet   Diet Carb Modified Fluid consistency: Thin; Room service appropriate? Yes   clopidogrel  75 mg daily and Xarelto (rivaroxaban) daily prior to admission, now on clopidogrel 75 mg daily and Xarelto (rivaroxaban) daily.   Therapy recommendations:  none Disposition:  home   LDL 47, goal < 70  Diabetes type II Controlled Home meds:  metformin 500 bid  HgbA1c 6.6, goal < 7.0 CBGs Recent Labs    01/25/21 1519 01/26/21 0750 01/26/21 1207  GLUCAP 114* 113* 173*    SSI  Other Stroke Risk Factors    Former smoker. Quit 36 years ago.   Obesity, Body mass index is 33.39 kg/m., BMI >/= 30 associated with increased stroke risk, recommend weight loss, diet and exercise as appropriate   Hx stroke/TIA    Coronary artery disease    Obstructive sleep apnea,  on CPAP at home   I have personally obtained history,examined this patient, reviewed notes, independently viewed imaging studies, participated in medical decision making and plan of care.ROS completed by me personally and pertinent positives fully documented  I have made any additions or clarifications directly to the above note. Agree with note above.  Presented with transient episode of expressive aphasia likely left hemispheric TIA but since patient is already on long-term anticoagulation for his DVT/PE will not pursue long-term cardiac monitoring for paroxysmal A. fib at this time.  Continue ongoing stroke work-up and aggressive risk factor modification.  Continue Xarelto  and Plavix.  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  Long discussion with patient and wife and answered questions.  Discussed with Dr. Thedore Mins.  Greater than 50% time during this 35-minute visit was spent in counseling and coordination of care about his TIA jaw pain and answering questions.  Delia Heady, MD Medical Director [facility] Pager: [phone] 01/26/2021 3:37 PM     To contact Stroke Continuity provider, please refer to [email or web address]. After hours, contact General Neurology

## 2021-01-26 NOTE — Care Management Obs Status (Signed)
MEDICARE OBSERVATION STATUS NOTIFICATION   Patient Details  Name: Xavier [name]. MRN: [id number] Date of Birth: 17-Oct-1965   Medicare Observation Status Notification Given:  Yes    Lawerance Sabal, RN 01/26/2021, 3:52 PM

## 2021-01-26 NOTE — Evaluation (Signed)
Physical Therapy/Vestibular Evaluation and Discharge  Patient Details Name: Xavier [name]. MRN: [id number] DOB: [birth year] Today's Date: 01/26/2021  History of Present Illness  55 y.o. male presented to ED 01/25/21 after several days of symptoms (word-finding, jaw locking, blurry vision, dizziness, tinnitus),  MRI/MRA brain normal   PMH significant for recent lumbar spine fusion 8/24, type 2 diabetes, hypertension, hyperlipidemia, obesity (33.39 BMI), OSA on BiPAP, prior stroke without residual deficit, coronary artery disease s/p stent, decreased right eye visual acuity.  Clinical Impression   Patient evaluated by Physical Therapy with no further acute PT needs identified. All education has been completed and the patient has no further questions. Patient had no vestibular symptoms during assessment. Has history of imbalance which has improved s/p recent back surgery and improved sensation in LLE. Continues with slight limp due to LLE weakness and normally uses a cane. Surgeon may choose to refer to OPPT on his follow-up visit post recent back surgery. Acute PT is signing off. Thank you for this referral.        Recommendations for follow up therapy are one component of a multi-disciplinary discharge planning process, led by the attending physician.  Recommendations may be updated based on patient status, additional functional criteria and insurance authorization.  Follow Up Recommendations No PT follow up    Equipment Recommendations  None recommended by PT    Recommendations for Other Services       Precautions / Restrictions Precautions Precautions: Back Required Braces or Orthoses: Spinal Brace Spinal Brace: Lumbar corset (brace in the car; states he's been walking in home without it) Restrictions Weight Bearing Restrictions: No Other Position/Activity Restrictions: Spouse brought brace, but it's in the car.     01/26/21 1058  Vestibular Assessment  General Observation  Sitting upright in chair; denies symptoms currently  Symptom Behavior  Subjective history of current problem reports yesterday when in bed it began to feel like it was moving and had high-pitched sound in his ears  Type of Dizziness  "World moves"  Frequency of Dizziness once  Duration of Dizziness unsure  Symptom Nature Spontaneous  Aggravating Factors No known aggravating factors  Relieving Factors No known relieving factors  Progression of Symptoms Better  History of similar episodes none  Oculomotor Exam  Oculomotor Alignment Normal  Ocular ROM normal  Spontaneous Absent  Gaze-induced  Absent  Smooth Pursuits Saccades (in lower lateral fields, several "skips" for eyes to follow pen, reports it looks double briefly and then comes together)  Vestibulo-Ocular Reflex  VOR to Slow Head Movement Normal  Other Tests  Comments denies tinnitus or ear pain  Positional Sensitivities  Head Turning x 5 0  Head Nodding x 5 1  Positional Sensitivities Comments looking up and to the left with +dizziness; no dizziness with up and to the right     Mobility  Bed Mobility Overal bed mobility: Modified Independent             General bed mobility comments: Pt sitting up in recliner upon arrival and at end of session.    Transfers Overall transfer level: Modified independent Equipment used: None Transfers: Sit to/from Stand Sit to Stand: Independent            Ambulation/Gait Ambulation/Gait assistance: Min guard Gait Distance (Feet): 200 Feet Assistive device: None Gait Pattern/deviations: Step-through pattern;Decreased stride length;Trendelenburg Gait velocity: reduced   General Gait Details: No cane, provided HHA on rt. Pt with slow gait and decreased bil step length. Noted trendelenburg when  in wt-bearing on LLE--pt denies pain/antalgic gait. Reports gait was worse prior to surgery when he could not feel LLE. Reports h/o imbalance prior to recent surgery. Feels his walking  is at baseline compared to 5 days ago.  Stairs            Wheelchair Mobility    Modified Rankin (Stroke Patients Only)       Balance Overall balance assessment: Mild deficits observed, not formally tested Sitting-balance support: Feet supported Sitting balance-Leahy Scale: Fair     Standing balance support: No upper extremity supported Standing balance-Leahy Scale: Fair                               Pertinent Vitals/Pain Pain Assessment: No/denies pain Pain Intervention(s): Monitored during session    Home Living Family/patient expects to be discharged to:: Private residence Living Arrangements: Spouse/significant other;Children Available Help at Discharge: Family;Available 24 hours/day Type of Home: House Home Access: Stairs to enter Entrance Stairs-Rails: None Entrance Stairs-Number of Steps: 4 Home Layout: One level Home Equipment: Walker - 2 wheels;Toilet riser;Hand held shower head;Cane - single point Additional Comments: recliner chair lift - patient sleeping in it    Prior Function Level of Independence: Independent with assistive device(s)   Gait / Transfers Assistance Needed: Using a SPC for mobility.  No assist with ADLs.     Comments: since back surgery has progressed from RW to [facility]     Hand Dominance   Dominant Hand: Right    Extremity/Trunk Assessment   Upper Extremity Assessment Upper Extremity Assessment: Overall WFL for tasks assessed    Lower Extremity Assessment Lower Extremity Assessment: LLE deficits/detail LLE Deficits / Details: Slight weakness compared to R; denies numbness/tingling in L leg, improved fromsurgery    Cervical / Trunk Assessment Cervical / Trunk Assessment: Other exceptions Cervical / Trunk Exceptions: recent spine surgery  Communication   Communication: Expressive difficulties (mild)  Cognition Arousal/Alertness: Awake/alert Behavior During Therapy: WFL for tasks assessed/performed Overall  Cognitive Status: Within Functional Limits for tasks assessed                                        General Comments General comments (skin integrity, edema, etc.): Spouse present. Educated on "BE FAST" and symptoms of stroke. Emphasized need to call 911 and rationale. Dr. Pearlean Brownie in at conclusion of session    Exercises     Assessment/Plan    PT Assessment Patent does not need any further PT services  PT Problem List         PT Treatment Interventions      PT Goals (Current goals can be found in the Care Plan section)  Acute Rehab PT Goals Patient Stated Goal: figure out what happened. PT Goal Formulation: All assessment and education complete, DC therapy    Frequency     Barriers to discharge        Co-evaluation               AM-PAC PT "6 Clicks" Mobility  Outcome Measure Help needed turning from your back to your side while in a flat bed without using bedrails?: None Help needed moving from lying on your back to sitting on the side of a flat bed without using bedrails?: None Help needed moving to and from a bed to a chair (including a wheelchair)?: None  Help needed standing up from a chair using your arms (e.g., wheelchair or bedside chair)?: None Help needed to walk in hospital room?: A Little Help needed climbing 3-5 steps with a railing? : A Little 6 Click Score: 22    End of Session   Activity Tolerance: Patient tolerated treatment well Patient left: with call bell/phone within reach;in chair;with family/visitor present;Other (comment) (Dr. Pearlean Brownie)   PT Visit Diagnosis: Unsteadiness on feet (R26.81);Muscle weakness (generalized) (M62.81);Difficulty in walking, not elsewhere classified (R26.2)    Time: 7622-6333 PT Time Calculation (min) (ACUTE ONLY): 22 min   Charges:   PT Evaluation $PT Eval Moderate Complexity: 1 Mod           Jerolyn Center, PT Pager [phone]   Zena Amos 01/26/2021, 11:12 AM

## 2021-01-26 NOTE — Consult Note (Signed)
Neurology Consultation Reason for Consult: Transient difficulty with speech Requesting Physician: Sueanne Margarita  CC: Jaw pain, headache, transient difficulty with speech  History is obtained from: Patient and chart review  HPI: Xavier [name]. is a 55 y.o. male with a past medical history significant for recent lumbar spine fusion, type 2 diabetes, hypertension, hyperlipidemia, obesity (33.39 BMI), OSA on BiPAP, prior stroke without residual deficit, coronary artery disease s/p stent decreased right eye visual acuity.  He reports he was in his usual state of health on Sunday when he had some difficulty speaking with his family, mixing up his words a bit for example being unable to get the syllables of the word muscle out in the right order.  The next day he woke up with jaw pain in the right TMJ, which was clenched shut and swollen.  He came in for evaluation of this to the ED but had a severe headache while there (one of his typical migraines, not the worst headache he has ever had in his life).  Due to the wait times he decided to return home and did have improvement in his jaw issue with muscle relaxers a pain pill and positional support.  He denies jaw claudication, transient loss of vision, proximal muscle weakness or any other numbness or focal neurological deficits, other than some light ringing in his ears bilaterally.  Regarding his headache history, he reports migraines happening rarely (once every few months typically).  He notes he had some bloody discharge from his ear back in February on the right side.  He did not have any visual symptoms other than blurry vision while waiting in the ED.  He additionally has ringing in his ears that has been ongoing for at least a few days.  He endorses compliance with all of his medications  LKW: 9/11 tPA given?: No out of the window Premorbid modified rankin scale:      1 - No significant disability. Able to carry out all usual activities, despite  some symptoms.  ROS: All other review of systems was negative except as noted in the HPI.   Past Medical History:  Diagnosis Date   ANEMIA, PERNICIOUS 04/03/2007   Anxiety and depression    Arthritis    "left hand" (06/26/2016)   Chronic lower back pain    grd I degen spondylolisthesis at L4-5 w/associated synovial cyst at facet at this level +underlying severe epidural lipomatosis L5-S1 up to L4-5--plan for surg w/Dr. Rolena Infante 12/2020.   Chronic renal insufficiency, stage 3 (moderate) ([place]) 2022   GFR around 60   Colon cancer screening 10/27/2016   Cologuard NEG-->rpt 3 yrs   Complication of anesthesia    slow to wake up   CORONARY ARTERY DISEASE 12/05/2006   DIABETES MELLITUS, TYPE II dx'd 2001   DVT (deep venous thrombosis) ([place]) 2016   BLE   GERD (gastroesophageal reflux disease)    History of balanitis    x 2. Holding off on circ as of 05/2020 urol f/u   History of blood transfusion 2001   "when I had my hand OR"   History of gout    History of kidney stones    HYPERCHOLESTEROLEMIA 07/17/2007   HYPERTENSION 12/05/2006   Learning disability    Migraine    "1-2/month" (06/26/2016)   Myocardial infarction [facility]) ~ 2003   Obesity, Class II, BMI 35-39.9    OSA on CPAP    Pt cannot recall setting clearly but thinks it is 5 cm h20.  PE (pulmonary embolism) 2016   Peripheral vascular disease ([place])    Simple renal cyst    X 2 on R kidney (one 5 cm and one 2 cm)   Stroke ([place]) ~ 2006   denies residual on 06/26/2016   TIA (transient ischemic attack) ?06/25/2016   VISUAL ACUITY, DECREASED, RIGHT EYE 02/19/2009   Past Surgical History:  Procedure Laterality Date   CARDIAC CATHETERIZATION  1990s; 2012; 06/2016   06/2016 distal LAD DES stent.  EF 55-65%   CARDIOVASCULAR STRESS TEST  06/2016   Echo stress test->"inconclusive"-->cath was done after this   CATARACT EXTRACTION W/ INTRAOCULAR LENS IMPLANT Right 2017   CORONARY STENT INTERVENTION N/A 06/27/2016   Procedure: Coronary  Stent Intervention;  Surgeon: Leonie Man, MD;  Location: [place] [facility];  Service: Cardiovascular;  Laterality: N/A;   EYE SURGERY Right    Cataract   INGUINAL HERNIA REPAIR Bilateral 1990s   INTRAVASCULAR PRESSURE WIRE/FFR STUDY N/A 06/27/2016   Procedure: Intravascular Pressure Wire/FFR Study;  Surgeon: Leonie Man, MD;  Location: [place] [facility];  Service: Cardiovascular;  Laterality: N/A;   LEFT HEART CATH AND CORONARY ANGIOGRAPHY N/A 06/27/2016   Procedure: Left Heart Cath and Coronary Angiography;  Surgeon: Leonie Man, MD;  Location: [place] [facility];  Service: Cardiovascular;  Laterality: N/A;   LUMBAR LAMINECTOMY/DECOMPRESSION MICRODISCECTOMY Left 01/05/2021   Procedure: Left L4-5 Gill Decompression;  Surgeon: Melina Schools, MD;  Location: [facility];  Service: Orthopedics;  Laterality: Left;   REATTACHMENT HAND Left ~ 2001   w/carpal tunnel release   UMBILICAL HERNIA REPAIR  1990s   w/IHR    Current Outpatient Medications  Medication Instructions   Blood Glucose Monitoring Suppl (ACCU-CHEK GUIDE ME) w/Device KIT USE TO CHECK BLOOD SUGAR 1 TIME PER DAY. DX CODE: E11.9   bromocriptine (PARLODEL) 2.5 mg, Oral, Daily   clopidogrel (PLAVIX) 75 MG tablet TAKE 1 TABLET BY MOUTH EVERY DAY   DULoxetine (CYMBALTA) 30 mg, Oral, Daily,     EPINEPHrine (EPI-PEN) 0.3 mg, Intramuscular, As needed   fenofibrate (TRICOR) 145 MG tablet TAKE 1 TABLET BY MOUTH EVERY DAY   FLUoxetine (PROZAC) 40 mg, Oral, Daily,     fluticasone (FLONASE) 50 MCG/ACT nasal spray USE ONE SPRAY IN EACH NOSTRIL TWICE A DAY   gabapentin (NEURONTIN) 300 mg, Oral, 2 times daily   glucose blood (ACCU-CHEK GUIDE) test strip check your blood sugar once a day   hydrochlorothiazide (HYDRODIURIL) 12.5 MG tablet TAKE 1 TABLET BY MOUTH EVERY DAY WITH THE LOSARTAN   JARDIANCE 25 MG TABS tablet TAKE 1 TABLET BY MOUTH EVERY DAY   losartan (COZAAR) 100 MG tablet TAKE 1/2 TABLET BY MOUTH EVERY DAY   metFORMIN  (GLUCOPHAGE-XR) 500 MG 24 hr tablet TAKE 2 TABLETS BY MOUTH TWICE A DAY   methocarbamol (ROBAXIN) 500 mg, Oral, 2 times daily   nitroGLYCERIN (NITROSTAT) 0.4 mg, Sublingual, Every 5 min PRN   ondansetron (ZOFRAN) 4 mg, Oral, Every 8 hours PRN   oxyCODONE-acetaminophen (PERCOCET) 10-325 MG tablet 1 tablet, Oral, 3 times daily, Only takes twice daily   rosuvastatin (CRESTOR) 5 MG tablet TAKE 1 TABLET BY MOUTH EVERY DAY   Trulicity 4.5 mg, Injection, Weekly   XARELTO 20 MG TABS tablet TAKE 1 TABLET BY MOUTH EVERY DAY BEFORE SUPPER   Denies any new medications or missed medications  Family History  Problem Relation Age of Onset   COPD Mother    Lymphoma Father    Social History:  reports that he quit smoking about 36 years ago. His smoking use included cigarettes. He started smoking about 37 years ago. He has a 0.30 pack-year smoking history. He has never used smokeless tobacco. He reports that he does not drink alcohol and does not use drugs.   Exam: Current vital signs: BP 95/81 (BP Location: Right Arm)   Pulse 76   Temp 97.9 F (36.6 C) (Oral)   Resp 18   Ht 5' 11"  (1.803 m)   Wt 108.6 kg   SpO2 94%   BMI 33.39 kg/m  Vital signs in last 24 hours: Temp:  [97.6 F (36.4 C)-97.9 F (36.6 C)] 97.9 F (36.6 C) (09/14 0010) Pulse Rate:  [70-82] 76 (09/14 0010) Resp:  [12-18] 18 (09/14 0010) BP: (95-127)/(81-87) 95/81 (09/14 0010) SpO2:  [94 %-100 %] 94 % (09/14 0010) Weight:  [108.6 kg-108.9 kg] 108.6 kg (09/14 0010)   Physical Exam  Constitutional: Appears well-developed and well-nourished.  Obese Psych: Affect appropriate to situation, pleasant, calm and cooperative Eyes: No scleral injection HENT: No oropharyngeal obstruction.  No temporal artery tenderness.  No nuchal rigidity MSK: no joint deformities.  Cardiovascular: Normal rate and regular rhythm.  Respiratory: Slightly short of breath with an increased respiratory rate, no audible wheezing GI: Soft.  No  distension. There is no tenderness.  Skin: Warm dry and intact visible skin  Neuro: Mental Status: Patient is awake, alert, oriented to person, place, month, year, and situation. Patient is able to give a clear and coherent history. No signs of aphasia or neglect Cranial Nerves: II: Visual Fields are full. Pupils are equal, round, and reactive to light.   III,IV, VI: EOMI without ptosis or diploplia.  V: Facial sensation is symmetric to temperature VII: Facial movement is symmetric.  VIII: hearing is intact to voice X: Uvula elevates symmetrically XI: Shoulder shrug is symmetric. XII: tongue is midline without atrophy or fasciculations.  Motor: Tone is normal. Bulk is normal. 5/5 strength was present in all four extremities, other than some loss of muscle bulk in the ulnar nerve distribution on the left upper extremity Sensory: Sensation is symmetric to light touch and temperature in the arms and legs --reduced in the hand at the ulnar nerve distribution Deep Tendon Reflexes: 2+ and symmetric in the biceps and patellae.  Cerebellar: FNF and HKS are intact bilaterally  NIHSS total 0    I have reviewed labs in epic and the results pertinent to this consultation are:   Lab Results  Component Value Date   CHOL 145 01/20/2020   HDL 63.60 01/20/2020   LDLCALC 57 01/20/2020   LDLDIRECT 82.0 11/04/2018   TRIG 122.0 01/20/2020   CHOLHDL 2 01/20/2020  Repeat pending  Lab Results  Component Value Date   HGBA1C 7.5 (H) 01/05/2021   Lab Results  Component Value Date   VITAMINB12 329 01/10/2018     I have reviewed the images obtained:  CT head 01/25/2021 personally reviewed, negative for acute intracranial process  CT chest PE protocol 9/13 negative for PE  Unresulted Labs (From admission, onward)     Start     Ordered   01/26/21 0500  Hemoglobin A1c  (Labs)  Tomorrow morning,   R       Question:  Specimen collection method  Answer:  Lab=Lab collect   01/26/21 0151    01/26/21 0500  Lipid panel  (Labs)  Tomorrow morning,   R       Comments: Fasting   Question:  Specimen collection method  Answer:  Lab=Lab collect   01/26/21 0151   01/26/21 0152  Protime-INR  (Labs)  Once,   R       Question:  Specimen collection method  Answer:  Lab=Lab collect   01/26/21 0151   01/26/21 0152  HIV Antibody (routine testing w rflx)  (HIV Antibody (Routine testing w reflex) panel)  Once,   R       Question:  Specimen collection method  Answer:  Lab=Lab collect   01/26/21 0151   01/26/21 0152  CBC  Once,   R       Question:  Specimen collection method  Answer:  Lab=Lab collect   01/26/21 0151   01/26/21 7342  Basic metabolic panel  Once,   R       Question:  Specimen collection method  Answer:  Lab=Lab collect   01/26/21 0151   01/26/21 0151  Vitamin B12  Once,   R       Question:  Specimen collection method  Answer:  Lab=Lab collect   01/26/21 0151             Impression: 55 year old male with a past medical history significant for multiple vascular risk factors as detailed above presenting with concern for TIA symptoms on Sunday (delayed presentation secondary to ED wait times).  Additionally he has some right jaw pain and reported positional dizziness to other providers.  May have a multifactorial neuropathy (metabolic syndrome, A76 deficiency, etc.).  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  Given his risk factors and transient neurological symptoms as a stroke work-up was recommended and is nearly complete as detailed below  Recommendations:  # TIA workup - Stroke labs TSH, HgbA1c, fasting lipid panel  -Adjust medications as needed for euthyroid goal, A1c goal less than 7%, LDL goal less than 70 - MRI brain, MRA of the brain without contrast and MRA neck w/wo  - Frequent neuro checks - Echocardiogram given new dizziness - Continue Plavix 75 mg daily (coronary  artery disease indication) - Continue Eliquis 5 mg twice daily - Risk factor modification, diet, exercise, risk factor modification discussed patient - Telemetry monitoring; 30 day event monitor on discharge if no arrythmias captured  - Blood pressure goal   - Normotension given out of the permissive HTN window - PT consult (vestibular eval),  - Stroke team to follow   # History of pernicious anemia,  -not on B12 supplementation per medication reconciliation -Agree with B12 level, goal greater than 500.  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

## 2021-01-26 NOTE — Progress Notes (Signed)
Placed patient on CPAP via FFM, auto titrate settings 15.0, min 5.0) cm H20 21% FIO2. RN aware.

## 2021-01-26 NOTE — Evaluation (Signed)
Occupational Therapy Evaluation Patient Details Name: Xavier [name]. MRN: [id number] DOB: 04/13/1966 Today's Date: 01/26/2021   History of Present Illness 55 y.o. male presented to ED 01/25/21 after several days of symptoms (word-finding, jaw locking, blurry vision, dizziness, tinnitus),  MRI/MRA brain normal   PMH significant for recent lumbar spine fusion 8/24, type 2 diabetes, hypertension, hyperlipidemia, obesity (33.39 BMI), OSA on BiPAP, prior stroke without residual deficit, coronary artery disease s/p stent, decreased right eye visual acuity.   Clinical Impression   Patient admitted for the diagnosis above.  Patient reports no dizziness, blurred vision has resolved, but he still feels a little unsteady.  He lives with his spouse, who is able to assist as needed at home.  The patient is still using a SPC at home, but otherwise needs no assist with his ADLs.  Currently he is presenting at, or near his baseline, and no OT needs have been identified.  PT eval is pending.          Recommendations for follow up therapy are one component of a multi-disciplinary discharge planning process, led by the attending physician.  Recommendations may be updated based on patient status, additional functional criteria and insurance authorization.   Follow Up Recommendations  No OT follow up;Supervision - Intermittent    Equipment Recommendations  None recommended by OT    Recommendations for Other Services       Precautions / Restrictions Precautions Precautions: Back Required Braces or Orthoses: Spinal Brace Restrictions Weight Bearing Restrictions: No Other Position/Activity Restrictions: Spouse brought brace, but it's in the car.      Mobility Bed Mobility Overal bed mobility: Modified Independent               Patient Response: Cooperative  Transfers Overall transfer level: Modified independent                    Balance Overall balance assessment: Mild deficits  observed, not formally tested                                         ADL either performed or assessed with clinical judgement   ADL Overall ADL's : At baseline                                             Vision Patient Visual Report: No change from baseline Additional Comments: blurred vision has resolved     Perception  WFL   Praxis  Intact    Pertinent Vitals/Pain Pain Assessment: No/denies pain Pain Intervention(s): Monitored during session     Hand Dominance Right   Extremity/Trunk Assessment Upper Extremity Assessment Upper Extremity Assessment: Overall WFL for tasks assessed   Lower Extremity Assessment Lower Extremity Assessment: Defer to PT evaluation   Cervical / Trunk Assessment Cervical / Trunk Assessment: Other exceptions Cervical / Trunk Exceptions: recent spine surgery   Communication Communication Communication: Expressive difficulties   Cognition Arousal/Alertness: Awake/alert Behavior During Therapy: WFL for tasks assessed/performed Overall Cognitive Status: Within Functional Limits for tasks assessed                                     General Comments   VSS  on RA    Exercises     Shoulder Instructions      Home Living Family/patient expects to be discharged to:: Private residence Living Arrangements: Spouse/significant other;Children Available Help at Discharge: Family;Available 24 hours/day Type of Home: House Home Access: Stairs to enter [employer] of Steps: 4 Entrance Stairs-Rails: None Home Layout: One level     Bathroom Shower/Tub: Tub/shower unit;Walk-in shower   Bathroom Toilet: Handicapped height Bathroom Accessibility: Yes How Accessible: Accessible via walker Home Equipment: Walker - 2 wheels;Toilet riser;Hand held shower head   Additional Comments: recliner chair lift - patient sleeping in it      Prior Functioning/Environment Level of Independence:  Independent with assistive device(s)  Gait / Transfers Assistance Needed: Using a SPC for mobility.  No assist with ADLs.              OT Problem List: Impaired balance (sitting and/or standing)      OT Treatment/Interventions:      OT Goals(Current goals can be found in the care plan section) Acute Rehab OT Goals Patient Stated Goal: figure out what happened. OT Goal Formulation: With patient Time For Goal Achievement: 01/26/21 Potential to Achieve Goals: Good  OT Frequency:     Barriers to D/C:  None noted          Co-evaluation              AM-PAC OT "6 Clicks" Daily Activity     Outcome Measure Help from another person eating meals?: None Help from another person taking care of personal grooming?: None Help from another person toileting, which includes using toliet, bedpan, or urinal?: None Help from another person bathing (including washing, rinsing, drying)?: None Help from another person to put on and taking off regular upper body clothing?: None Help from another person to put on and taking off regular lower body clothing?: None 6 Click Score: 24   End of Session Nurse Communication: Mobility status  Activity Tolerance: Patient tolerated treatment well Patient left: in bed;with call bell/phone within reach;with family/visitor present  OT Visit Diagnosis: Unsteadiness on feet (R26.81);Pain                Time: 7253-6644 OT Time Calculation (min): 23 min Charges:  OT General Charges $OT Visit: 1 Visit OT Evaluation $OT Eval High Complexity: 1 High OT Treatments $Self Care/Home Management : 8-22 mins  01/26/2021  RP, OTR/L  Acute Rehabilitation Services  Office:  [phone]   Suzanna Obey 01/26/2021, 9:40 AM

## 2021-01-26 NOTE — Progress Notes (Signed)
0000- Patient arrived into room 5W03 vis EMS as a transfer from [facility] ED. A&O x3. VSS. Denies any pain. No signs of acute distress. Denies any dizziness at this time. Bed locked and in lowest position, non skid foot wear in place, call light and personal belongings within reach.

## 2021-01-26 NOTE — Progress Notes (Signed)
PT Cancellation Note  Patient Details Name: Xavier [name]. MRN: [id number] DOB: 19-Jul-1965   Cancelled Treatment:    Reason Eval/Treat Not Completed: Patient at procedure or test/unavailable  Will return this a.m. for evaluation   Jerolyn Center, PT Pager [phone]   Zena Amos 01/26/2021, 8:15 AM

## 2021-01-26 NOTE — Progress Notes (Signed)
PROGRESS NOTE                                                                                                                                                                                                             Patient Demographics:    Xavier [name], is a 55 y.o. male, DOB - November 19, 1965, TKW:[id number]  Outpatient Primary MD for the patient is McGowen, Maryjean Morn, MD    LOS - 0  Admit date - 01/25/2021    Chief Complaint  Patient presents with   Dizziness       Brief Narrative (HPI from H&P)   Xavier [name]. is a 55 y.o. male with a pertinent history of  DM, HLD, HTN, pulmonary embolism, DVT on xarelto, obesity, OSA on cpap, CAD s/p stents still on plavix, recent spinal surgery on 8/24 and has been on muscle relaxers and some narcotics, at home he developed some slurred speech a few days ago along with some jaw pain and locked jaw for a few hours.  Came to the ER where initial head CT was negative and he was admitted for further stroke/TIA work-up.   Subjective:    Xavier [name] today has, No headache, No chest pain, No abdominal pain - No Nausea, No new weakness tingling or numbness, no SOB.   Assessment  & Plan :    Principal Problem:   TIA (transient ischemic attack) Active Problems:   Diabetes (HCC)   HYPERCHOLESTEROLEMIA   Essential hypertension   Coronary atherosclerosis   Depressive disorder   Obesity, morbid (HCC)   Personal history of DVT (deep vein thrombosis)   OSA (obstructive sleep apnea)   Slurred speech, some expressive aphasia and one episode of lockjaw - he could have dystonic activity causing lockjaw however slurred speech and expressive aphasia could have been TIA, MRI brain negative, full stroke work-up underway, he is currently on combination of Plavix and Eliquis which will be continued, echocardiogram is pending, PT OT and speech to evaluate as well.  His symptoms are improving.  A1c and  LDL are at goal.  2.  Recent spine surgery.  Minimize narcotics and muscle relaxants, post op scar site appears stable.  After discharge follow-up with Dr. Shon Baton.  3.  Obesity with OSA.  BMI of 33, follow with PCP for weight loss, CPAP nightly.  4.  History of DVT.  Continue Xarelto.  5.  CAD and dyslipidemia.  Continue combination of Plavix and statin for secondary prevention.  6.  History of pernicious anemia with vitamin B12 deficiency.  Placed on supplementation.      Condition -fair  Family Communication  :  wife bedside  Code Status :  Full  Consults  :  Neuro  PUD Prophylaxis :    Procedures  :     MRI/MRA brain.  Nonacute.  Go cardiogram.      Disposition Plan  :    Status is: Observation  Dispo: The patient is from: Home              Anticipated d/c is to: Home              Patient currently is not medically stable to d/c.   Difficult to place patient No   DVT Prophylaxis  :    rivaroxaban (XARELTO) tablet 20 mg    Lab Results  Component Value Date   PLT 465 (H) 01/26/2021    Diet :  Diet Order             Diet Carb Modified Fluid consistency: Thin; Room service appropriate? Yes  Diet effective now                    Inpatient Medications  Scheduled Meds:  clopidogrel  75 mg Oral Daily   DULoxetine  30 mg Oral Daily   FLUoxetine  40 mg Oral Daily   gabapentin  300 mg Oral BID   hydrochlorothiazide  12.5 mg Oral Daily   insulin aspart  0-6 Units Subcutaneous TID WC   losartan  50 mg Oral Daily   rivaroxaban  20 mg Oral Q supper   rosuvastatin  5 mg Oral Daily   Continuous Infusions: PRN Meds:.acetaminophen **OR** acetaminophen (TYLENOL) oral liquid 160 mg/5 mL **OR** acetaminophen, oxyCODONE  Antibiotics  :    Anti-infectives (From admission, onward)    None        Time Spent in minutes  30   Susa Raring M.D on 01/26/2021 at 1:10 PM  To page go to [email or web address]   [facility] -  Office  [phone]     See all Orders from today for further details    Objective:   Vitals:   01/26/21 0400 01/26/21 0740 01/26/21 0957 01/26/21 1159  BP: 117/84 127/87 (!) 136/91 106/81  Pulse: 73 68 82 77  Resp: 14 16 14 20   Temp: 97.8 F (36.6 C) 97.6 F (36.4 C) 97.8 F (36.6 C) 97.6 F (36.4 C)  TempSrc: Oral Oral Oral Oral  SpO2: 95% 96% 95% 93%  Weight:      Height:        Wt Readings from Last 3 Encounters:  01/26/21 108.6 kg  01/24/21 105.2 kg  01/05/21 111.1 kg     Intake/Output Summary (Last 24 hours) at 01/26/2021 1310 Last data filed at 01/26/2021 0900 Gross per 24 hour  Intake 120 ml  Output 500 ml  Net -380 ml     Physical Exam  Awake Alert, No new F.N deficits, Normal affect [place].AT,PERRAL Supple Neck,No JVD, No cervical lymphadenopathy appriciated.  Symmetrical Chest wall movement, Good air movement bilaterally, CTAB RRR,No Gallops,Rubs or new Murmurs, No Parasternal Heave +ve B.Sounds, Abd Soft, No tenderness, No organomegaly appriciated, No rebound - guarding or rigidity. No Cyanosis, Clubbing or edema, No new Rash or bruise  Data Review:    CBC Recent Labs  Lab 01/24/21 1323 01/25/21 1630 01/26/21 0639  WBC 8.7 8.9 7.5  HGB 15.0 14.1 13.6  HCT 46.2 42.6 39.5  PLT 620* 546* 465*  MCV 94.3 90.8 90.4  MCH 30.6 30.1 31.1  MCHC 32.5 33.1 34.4  RDW 13.2 13.2 13.1  LYMPHSABS 2.1 1.8  --   MONOABS 0.7 0.6  --   EOSABS 0.2 0.1  --   BASOSABS 0.1 0.1  --     Recent Labs  Lab 01/24/21 1323 01/25/21 1630 01/26/21 0639  NA 136 137 134*  K 4.3 3.6 3.4*  CL 97* 100 101  CO2 22 24 24   GLUCOSE 102* 139* 114*  BUN 16 17 17   CREATININE 1.16 1.14 1.06  CALCIUM 9.6 9.9 9.4  AST 21 13*  --   ALT 21 18  --   ALKPHOS 39 35*  --   BILITOT 0.4 0.3  --   ALBUMIN 4.1 4.5  --   DDIMER  --  0.63*  --   INR  --   --  1.8*  HGBA1C  --   --  6.6*     ------------------------------------------------------------------------------------------------------------------ Recent Labs    01/26/21 0639  CHOL 132  HDL 53  LDLCALC 47  TRIG 159*  CHOLHDL 2.5    Lab Results  Component Value Date   HGBA1C 6.6 (H) 01/26/2021   ------------------------------------------------------------------------------------------------------------------ No results for input(s): TSH, T4TOTAL, T3FREE, THYROIDAB in the last 72 hours.  Invalid input(s): FREET3  Cardiac Enzymes No results for input(s): CKMB, TROPONINI, MYOGLOBIN in the last 168 hours.  Invalid input(s): CK ------------------------------------------------------------------------------------------------------------------    Component Value Date/Time   BNP 6.9 06/25/2016 1552      Radiology Reports DG Chest 2 View  Result Date: 01/25/2021 CLINICAL DATA:  dyspnea EXAM: CHEST - 2 VIEW COMPARISON:  12/23/2020. FINDINGS: The heart size and mediastinal contours are within normal limits. Both lungs are clear. No visible pleural effusions or pneumothorax. No acute osseous abnormality. IMPRESSION: No active cardiopulmonary disease. Electronically Signed   By: 01/27/2021 M.D.   On: 01/25/2021 17:02   DG Lumbar Spine 2-3 Views  Result Date: 01/05/2021 CLINICAL DATA:  L4-L5 decompression EXAM: LUMBAR SPINE - 2-3 VIEW COMPARISON:  None. FINDINGS: Two lateral intraoperative views lumbar spine provided. Initial view demonstrates sharp tip probes posterior to the L5 and S1 vertebral bodies. Second film demonstrates a curved tip probe at the L4-L5 disc space. IMPRESSION: Intraoperative views as above. Findings related to operating room at time of interpretation Electronically Signed   By: 01/27/2021 M.D.   On: 01/05/2021 13:36   CT Head Wo Contrast  Result Date: 01/25/2021 CLINICAL DATA:  Transient ischemic attack (TIA) Dizziness, non-specific EXAM: CT HEAD WITHOUT CONTRAST TECHNIQUE:  Contiguous axial images were obtained from the base of the skull through the vertex without intravenous contrast. COMPARISON:  CT head 10/02/2010. FINDINGS: Brain: No evidence of acute large vascular territory infarction, hemorrhage, hydrocephalus, extra-axial collection or mass lesion/mass effect. Vascular: No hyperdense vessel identified calcific intracranial atherosclerosis. Skull: No acute fracture. Sinuses/Orbits: Clear sinuses. No acute orbital findings. Other: No mastoid effusions. IMPRESSION: No evidence of acute intracranial abnormality. Electronically Signed   By: 01/27/2021 M.D.   On: 01/25/2021 17:00   CT Angio Chest PE W/Cm &/Or Wo Cm  Result Date: 01/25/2021 CLINICAL DATA:  dizzies and the inability to form sentences on Sunday. PE suspected, low/intermediate prob, positive D-dimer EXAM: CT ANGIOGRAPHY CHEST WITH CONTRAST TECHNIQUE:  Multidetector CT imaging of the chest was performed using the standard protocol during bolus administration of intravenous contrast. Multiplanar CT image reconstructions and MIPs were obtained to evaluate the vascular anatomy. CONTRAST:  66mL OMNIPAQUE IOHEXOL 350 MG/ML SOLN COMPARISON:  CT chest 09/16/2020, CT abdomen pelvis 05/27/2006 FINDINGS: Cardiovascular: Satisfactory opacification of the pulmonary arteries to the segmental level. No evidence of pulmonary embolism. Normal heart size. No pericardial effusion. Mild atherosclerotic plaque of the aorta. Left anterior descending coronary artery stent. Mediastinum/Nodes: No enlarged mediastinal, hilar, or axillary lymph nodes. Thyroid gland, trachea, and esophagus demonstrate no significant findings. Lungs/Pleura: No focal consolidation. Bilateral lower lobe subsegmental atelectasis. Stable right lower lobe punctate calcified micronodule likely sequelae of prior granulomatous disease (6:100). No pulmonary mass. No pleural effusion. No pneumothorax. Upper Abdomen: No acute abnormality. Musculoskeletal: No chest  wall abnormality. No suspicious lytic or blastic osseous lesions. No acute displaced fracture. Review of the MIP images confirms the above findings. IMPRESSION: 1. No pulmonary embolus. 2. No acute intrathoracic abnormality. 3.  Aortic Atherosclerosis (ICD10-I70.0). Electronically Signed   By: Tish Frederickson M.D.   On: 01/25/2021 18:39   MR ANGIO HEAD WO CONTRAST  Result Date: 01/26/2021 CLINICAL DATA:  Difficulty opening jaw with slurred speech and dizziness EXAM: MRI HEAD WITHOUT CONTRAST MRA HEAD WITHOUT CONTRAST MRA OF THE NECK WITHOUT AND WITH CONTRAST TECHNIQUE: Multiplanar, multi-echo pulse sequences of the brain and surrounding structures were acquired without intravenous contrast. Angiographic images of the Circle of Willis were acquired using MRA technique without intravenous contrast. Angiographic images of the neck were acquired using MRA technique without and with intravenous contrast. Carotid stenosis measurements (when applicable) are obtained utilizing NASCET criteria, using the distal internal carotid diameter as the denominator. CONTRAST:  65mL GADAVIST GADOBUTROL 1 MMOL/ML IV SOLN COMPARISON:  Head CT from yesterday FINDINGS: MR HEAD FINDINGS Brain: No acute infarction, hemorrhage, hydrocephalus, extra-axial collection or mass lesion. No generalized white matter disease or atrophy Vascular: Normal flow voids. Skull and upper cervical spine: Normal marrow signal. Sinuses/Orbits: Right cataract resection. Essentially clear paranasal sinuses. MRA HEAD FINDINGS Anterior circulation: Vessels are smooth and widely patent. Negative for aneurysm Posterior circulation: Vessels are smooth and widely patent. Negative for aneurysm. Anatomic variants:Fetal type left PCA. MRA NECK FINDINGS Aortic arch: No proximal subclavian stenosis Right carotid system: ICA tortuosity with looping. No beading, stenosis, or ulceration. Left carotid system: ICA tortuosity with looping. No beading, stenosis, or ulceration.  Vertebral arteries: The subclavian vessels are smoothly contoured and widely patent. The right vertebral artery is strongly dominant. No vertebral stenosis or convincing beading in the neck. The left V4 segment essentially ends in the PICA with possible stenosis near the PICA origin. IMPRESSION: Brain MRI: Normal. Intracranial MRA: Negative. Neck MRA: 1. Tortuous vessels without flow limiting stenosis or ulceration in the neck. 2. Equivocal stenosis of the non dominant left V4 segment near the PICA origin. Electronically Signed   By: Tiburcio Pea M.D.   On: 01/26/2021 07:22   MR MRA NECK W CONTRAST  Result Date: 01/26/2021 CLINICAL DATA:  Difficulty opening jaw with slurred speech and dizziness EXAM: MRI HEAD WITHOUT CONTRAST MRA HEAD WITHOUT CONTRAST MRA OF THE NECK WITHOUT AND WITH CONTRAST TECHNIQUE: Multiplanar, multi-echo pulse sequences of the brain and surrounding structures were acquired without intravenous contrast. Angiographic images of the Circle of Willis were acquired using MRA technique without intravenous contrast. Angiographic images of the neck were acquired using MRA technique without and with intravenous contrast. Carotid stenosis measurements (when applicable) are  obtained utilizing NASCET criteria, using the distal internal carotid diameter as the denominator. CONTRAST:  79mL GADAVIST GADOBUTROL 1 MMOL/ML IV SOLN COMPARISON:  Head CT from yesterday FINDINGS: MR HEAD FINDINGS Brain: No acute infarction, hemorrhage, hydrocephalus, extra-axial collection or mass lesion. No generalized white matter disease or atrophy Vascular: Normal flow voids. Skull and upper cervical spine: Normal marrow signal. Sinuses/Orbits: Right cataract resection. Essentially clear paranasal sinuses. MRA HEAD FINDINGS Anterior circulation: Vessels are smooth and widely patent. Negative for aneurysm Posterior circulation: Vessels are smooth and widely patent. Negative for aneurysm. Anatomic variants:Fetal type left  PCA. MRA NECK FINDINGS Aortic arch: No proximal subclavian stenosis Right carotid system: ICA tortuosity with looping. No beading, stenosis, or ulceration. Left carotid system: ICA tortuosity with looping. No beading, stenosis, or ulceration. Vertebral arteries: The subclavian vessels are smoothly contoured and widely patent. The right vertebral artery is strongly dominant. No vertebral stenosis or convincing beading in the neck. The left V4 segment essentially ends in the PICA with possible stenosis near the PICA origin. IMPRESSION: Brain MRI: Normal. Intracranial MRA: Negative. Neck MRA: 1. Tortuous vessels without flow limiting stenosis or ulceration in the neck. 2. Equivocal stenosis of the non dominant left V4 segment near the PICA origin. Electronically Signed   By: Tiburcio Pea M.D.   On: 01/26/2021 07:22   MR BRAIN WO CONTRAST  Result Date: 01/26/2021 CLINICAL DATA:  Difficulty opening jaw with slurred speech and dizziness EXAM: MRI HEAD WITHOUT CONTRAST MRA HEAD WITHOUT CONTRAST MRA OF THE NECK WITHOUT AND WITH CONTRAST TECHNIQUE: Multiplanar, multi-echo pulse sequences of the brain and surrounding structures were acquired without intravenous contrast. Angiographic images of the Circle of Willis were acquired using MRA technique without intravenous contrast. Angiographic images of the neck were acquired using MRA technique without and with intravenous contrast. Carotid stenosis measurements (when applicable) are obtained utilizing NASCET criteria, using the distal internal carotid diameter as the denominator. CONTRAST:  58mL GADAVIST GADOBUTROL 1 MMOL/ML IV SOLN COMPARISON:  Head CT from yesterday FINDINGS: MR HEAD FINDINGS Brain: No acute infarction, hemorrhage, hydrocephalus, extra-axial collection or mass lesion. No generalized white matter disease or atrophy Vascular: Normal flow voids. Skull and upper cervical spine: Normal marrow signal. Sinuses/Orbits: Right cataract resection. Essentially  clear paranasal sinuses. MRA HEAD FINDINGS Anterior circulation: Vessels are smooth and widely patent. Negative for aneurysm Posterior circulation: Vessels are smooth and widely patent. Negative for aneurysm. Anatomic variants:Fetal type left PCA. MRA NECK FINDINGS Aortic arch: No proximal subclavian stenosis Right carotid system: ICA tortuosity with looping. No beading, stenosis, or ulceration. Left carotid system: ICA tortuosity with looping. No beading, stenosis, or ulceration. Vertebral arteries: The subclavian vessels are smoothly contoured and widely patent. The right vertebral artery is strongly dominant. No vertebral stenosis or convincing beading in the neck. The left V4 segment essentially ends in the PICA with possible stenosis near the PICA origin. IMPRESSION: Brain MRI: Normal. Intracranial MRA: Negative. Neck MRA: 1. Tortuous vessels without flow limiting stenosis or ulceration in the neck. 2. Equivocal stenosis of the non dominant left V4 segment near the PICA origin. Electronically Signed   By: Tiburcio Pea M.D.   On: 01/26/2021 07:22

## 2021-01-26 NOTE — Plan of Care (Signed)
Was not able to reach wife to confirm why takes bromocriptine so left off He tells me that he only takes muscle relaxer intermittently if he walks around a lot that day and takes the opioid occasionally.  Still gabapentin though.

## 2021-01-26 NOTE — H&P (Addendum)
History and Physical    Xavier [name]. NWG:[id number] DOB: [birth year] DOA: 01/25/2021  PCP: Xavier Sou, MD  Patient coming from: home Chief Complaint: word finding difficulties, intermittent, lock jaw and slurring words at times  HPI: Xavier [name]. is a 55 y.o. male with a pertinent history of  DM, HLD, HTN, pulmonary embolism, DVT on xarelto, obesity, OSA on cpap, CAD s/p stents still on plavix, recent spinal surgery on 8/24 and has been on muscle relaxers, opioids intermittently and has a history of migraines of which he has been having more recently with nausea and photophobia who presented actually presented yesterday to [name] but because of wait left and after seeing his back surgeon and PCP presented to [name] and then was admitted to [facility] [name] for concern for TIA.   He does recall having one of his migraines aroudn Saturday or Sunday.  Stated on Sunday was having slurred speech.  Prior TIA, had slurred speech and facial droop he states.  he did not have facial droop this time.  I was not able to corroborate story with wife, Xavier [name].  he has been taking muscle relaxers and pain pills and odesn't feel these have over sedated him.  couple episodes of word finding since Saturday. Yesterday, he felt his jaw had locked when he awoke and he had to work it open.  he has right TMJ tenderness.   Dizziness was noted by ED provider with vertiginous symptoms with quick head movements or positional changes. And felt that meclizine helped. I forgot to ask about blurry vision  In the ED, normotensive, hemodynamicaly stable, wbc 8.9, hgb14.1, plt 546, na 137, K 3.6, Scr 1.14, glucose 139, d-dimer 0.63, CT head negative, Cta PE negative and covid negative. EKG shows sinus rhythm with similar twi's in lateral leads from 2019  he states he came to [name] for a specific test.  Review of Systems: As per HPI otherwise 10 point review of systems negative.  Other pertinents as  below:  General - has been having chronic headaches that are similar to previous HEENT - has blurring of vision and vertigo at times Cardio - denies cp, palpitations Resp - denies sob, recent illness, or cough GI - denies n/v/d/GI pain, constipation GU - denies dysuria or other urinary changes MSK - has chornic back pain and chronic left hand pain after amputating it and putting it back on Skin - denies new skin concerns Neuro - as per hpi Psych -  denies new anxiety or depression  Past Medical History:  Diagnosis Date   ANEMIA, PERNICIOUS 04/03/2007   Anxiety and depression    Arthritis    "left hand" (06/26/2016)   Chronic lower back pain    grd I degen spondylolisthesis at L4-5 w/associated synovial cyst at facet at this level +underlying severe epidural lipomatosis L5-S1 up to L4-5--plan for surg w/[name] 12/2020.   Chronic renal insufficiency, stage 3 (moderate) ([place]) 2022   GFR around 60   Colon cancer screening 10/27/2016   Cologuard NEG-->rpt 3 yrs   Complication of anesthesia    slow to wake up   CORONARY ARTERY DISEASE 12/05/2006   DIABETES MELLITUS, TYPE II dx'd 2001   DVT (deep venous thrombosis) ([place]) 2016   BLE   GERD (gastroesophageal reflux disease)    History of balanitis    x 2. Holding off on circ as of 05/2020 urol f/u   History of blood transfusion 2001   "when  I had my hand OR"   History of gout    History of kidney stones    HYPERCHOLESTEROLEMIA 07/17/2007   HYPERTENSION 12/05/2006   Learning disability    Migraine    "1-2/month" (06/26/2016)   Myocardial infarction ([place]) ~ 2003   Obesity, Class II, BMI 35-39.9    OSA on CPAP    Pt cannot recall setting clearly but thinks it is 5 cm h20.     PE (pulmonary embolism) 2016   Peripheral vascular disease ([place])    Simple renal cyst    X 2 on R kidney (one 5 cm and one 2 cm)   Stroke ([place]) ~ 2006   denies residual on 06/26/2016   TIA (transient ischemic attack) ?06/25/2016   VISUAL ACUITY, DECREASED,  RIGHT EYE 02/19/2009    Past Surgical History:  Procedure Laterality Date   CARDIAC CATHETERIZATION  1990s; 2012; 06/2016   06/2016 distal LAD DES stent.  EF 55-65%   CARDIOVASCULAR STRESS TEST  06/2016   Echo stress test->"inconclusive"-->cath was done after this   CATARACT EXTRACTION W/ INTRAOCULAR LENS IMPLANT Right 2017   CORONARY STENT INTERVENTION N/A 06/27/2016   Procedure: Coronary Stent Intervention;  Surgeon: Xavier Man, MD;  [name]: [facility];  Service: Cardiovascular;  Laterality: N/A;   EYE SURGERY Right    Cataract   INGUINAL HERNIA REPAIR Bilateral 1990s   INTRAVASCULAR PRESSURE WIRE/FFR STUDY N/A 06/27/2016   Procedure: Intravascular Pressure Wire/FFR Study;  Surgeon: Xavier Man, MD;  [name]: [facility];  Service: Cardiovascular;  Laterality: N/A;   LEFT HEART CATH AND CORONARY ANGIOGRAPHY N/A 06/27/2016   Procedure: Left Heart Cath and Coronary Angiography;  Surgeon: Xavier Man, MD;  [name]: [facility];  Service: Cardiovascular;  Laterality: N/A;   LUMBAR LAMINECTOMY/DECOMPRESSION MICRODISCECTOMY Left 01/05/2021   Procedure: Left L4-5 Gill Decompression;  Surgeon: Xavier Schools, MD;  [name]: [place];  Service: Orthopedics;  Laterality: Left;   REATTACHMENT HAND Left ~ 2001   w/carpal tunnel release   UMBILICAL HERNIA REPAIR  1990s   w/IHR     reports that he quit smoking about 36 years ago. His smoking use included cigarettes. He started smoking about 37 years ago. He has a 0.30 pack-year smoking history. He has never used smokeless tobacco. He reports that he does not drink alcohol and does not use drugs.  Allergies  Allergen Reactions   Actos [Pioglitazone] Other (See Comments)    Potential cause of DVT   Bee Venom Anaphylaxis   Klonopin [Clonazepam] Anaphylaxis   Invokana [Canagliflozin] Nausea Only    Nausea/dizzy/bad taste in mouth   Niacin Other (See Comments)    "makes his crazy"    Family History  Problem  Relation Age of Onset   COPD Mother    Lymphoma Father      Prior to Admission medications   Medication Sig Start Date End Date Taking? Authorizing Provider  bromocriptine (PARLODEL) 2.5 MG tablet Take 1 tablet (2.5 mg total) by mouth daily. Patient taking differently: Take 1.25 mg by mouth daily. 10/25/20  Yes [name], MD  clopidogrel (PLAVIX) 75 MG tablet TAKE 1 TABLET BY MOUTH EVERY DAY Patient taking differently: Take 75 mg by mouth daily. 11/09/20  Yes Josue Hector, MD  Dulaglutide (TRULICITY) 4.5 LA/4.5XM SOPN Inject 4.5 mg as directed once a week. 01/14/21  Yes [name], MD  DULoxetine (CYMBALTA) 30 MG capsule Take 30 mg by mouth daily.   Yes [provider]  fenofibrate (TRICOR) 145 MG tablet TAKE 1 TABLET BY MOUTH EVERY DAY 11/09/20  Yes McGowen, Adrian Blackwater, MD  FLUoxetine (PROZAC) 40 MG capsule Take 40 mg by mouth daily.   Yes [provider]  gabapentin (NEURONTIN) 300 MG capsule Take 300 mg by mouth 2 (two) times daily. 12/13/20  Yes [provider]  hydrochlorothiazide (HYDRODIURIL) 12.5 MG tablet TAKE 1 TABLET BY MOUTH EVERY DAY WITH THE LOSARTAN 10/21/20  Yes McGowen, Adrian Blackwater, MD  JARDIANCE 25 MG TABS tablet TAKE 1 TABLET BY MOUTH EVERY DAY 08/17/20  Yes [name], MD  losartan (COZAAR) 100 MG tablet TAKE 1/2 TABLET BY MOUTH EVERY DAY 08/19/20  Yes McGowen, Adrian Blackwater, MD  metFORMIN (GLUCOPHAGE-XR) 500 MG 24 hr tablet TAKE 2 TABLETS BY MOUTH TWICE A DAY 11/10/20  Yes [name], MD  methocarbamol (ROBAXIN) 500 MG tablet Take 500 mg by mouth 2 (two) times daily.   Yes [provider]  nitroGLYCERIN (NITROSTAT) 0.4 MG SL tablet Place 1 tablet (0.4 mg total) under the tongue every 5 (five) minutes as needed for chest pain. 12/02/20  Yes Josue Hector, MD  oxyCODONE-acetaminophen (PERCOCET) 10-325 MG tablet Take 1 tablet by mouth 3 (three) times daily. Only takes twice daily   Yes [provider]  rosuvastatin (CRESTOR) 5 MG tablet  TAKE 1 TABLET BY MOUTH EVERY DAY 11/09/20  Yes McGowen, Adrian Blackwater, MD  XARELTO 20 MG TABS tablet TAKE 1 TABLET BY MOUTH EVERY DAY BEFORE SUPPER 08/17/20  Yes Martyn Ehrich, NP  Blood Glucose Monitoring Suppl (ACCU-CHEK GUIDE ME) w/Device KIT USE TO CHECK BLOOD SUGAR 1 TIME PER DAY. DX CODE: E11.9 10/13/20   [name], MD  EPINEPHrine 0.3 mg/0.3 mL IJ SOAJ injection Inject 0.3 mLs (0.3 mg total) into the muscle as needed for anaphylaxis. 01/20/20   McGowen, Adrian Blackwater, MD  fluticasone (FLONASE) 50 MCG/ACT nasal spray USE ONE SPRAY IN EACH NOSTRIL TWICE A DAY Patient taking differently: Place 1 spray into both nostrils daily as needed for allergies. 01/30/18   [name], MD  glucose blood (ACCU-CHEK GUIDE) test strip check your blood sugar once a day 09/26/20   [name], MD  ondansetron (ZOFRAN) 4 MG tablet Take 1 tablet (4 mg total) by mouth every 8 (eight) hours as needed for nausea or vomiting. 01/05/21   Xavier Schools, MD    Physical Exam: Vitals:   01/25/21 1509 01/25/21 1814 01/25/21 2215 01/26/21 0010  BP: 122/87 127/83 122/85 95/81  Pulse: 82 79 70 76  Resp: _0 Temp: 97.6 F (36.4 C)   97.9 F (36.6 C)  TempSrc:    Oral  SpO2: 100% 98% 100% 94%  Weight:    108.6 kg  Height:    _1  (1.803 m)    Constitutional: NAD, comfortable, obese Eyes: pupils equal and reactive to light, anicteric, without injection ENMT: MMM, throat without exudates or erythema Neck: normal, supple, no masses, no thyromegaly noted Respiratory: CTAB, nwob, thicker neck Cardiovascular: rrr w/o mrg, warm extremities Abdomen: NBS, NT,   Musculoskeletal: moving all 4 extremities, strength grossly intact 5/5 in the UE and LE's, DTR's grossly equal Skin: no rashes, lesions, ulcers. No induration Neurologic: CN 2-12 intact, equals equal Psychiatric: AO appearing, mentation appropriate    Labs on Admission: I have personally reviewed following labs and imaging studies  CBC: Recent Labs   Lab 01/24/21 1323 01/25/21 1630  WBC 8.7 8.9  NEUTROABS 5.5 6.3  HGB 15.0 14.1  HCT 46.2 42.6  MCV 94.3 90.8  PLT 620* 637*   Basic Metabolic Panel: Recent Labs  Lab 01/24/21 1323 01/25/21 1630  NA 136 137  K 4.3 3.6  CL 97* 100  CO2 22 24  GLUCOSE 102* 139*  BUN 16 17  CREATININE 1.16 1.14  CALCIUM 9.6 9.9   GFR: Estimated Creatinine Clearance: 92.8 mL/min (by C-G formula based on SCr of 1.14 mg/dL). Liver Function Tests: Recent Labs  Lab 01/24/21 1323 01/25/21 1630  AST 21 13*  ALT 21 18  ALKPHOS 39 35*  BILITOT 0.4 0.3  PROT 7.4 7.6  ALBUMIN 4.1 4.5   No results for input(s): LIPASE, AMYLASE in the last 168 hours. No results for input(s): AMMONIA in the last 168 hours. Coagulation Profile: No results for input(s): INR, PROTIME in the last 168 hours. Cardiac Enzymes: No results for input(s): CKTOTAL, CKMB, CKMBINDEX, TROPONINI in the last 168 hours. BNP (last 3 results) No results for input(s): PROBNP in the last 8760 hours. HbA1C: No results for input(s): HGBA1C in the last 72 hours. CBG: Recent Labs  Lab 01/25/21 1519  GLUCAP 114*   Lipid Profile: No results for input(s): CHOL, HDL, LDLCALC, TRIG, CHOLHDL, LDLDIRECT in the last 72 hours. Thyroid Function Tests: No results for input(s): TSH, T4TOTAL, FREET4, T3FREE, THYROIDAB in the last 72 hours. Anemia Panel: No results for input(s): VITAMINB12, FOLATE, FERRITIN, TIBC, IRON, RETICCTPCT in the last 72 hours. Urine analysis:    Component Value Date/Time   COLORURINE YELLOW 12/23/2020 1000   APPEARANCEUR CLOUDY (A) 12/23/2020 1000   LABSPEC 1.029 12/23/2020 1000   PHURINE 5.0 12/23/2020 1000   GLUCOSEU >=500 (A) 12/23/2020 1000   GLUCOSEU >=1000 (A) 10/10/2017 0858   HGBUR NEGATIVE 12/23/2020 1000   BILIRUBINUR NEGATIVE 12/23/2020 1000   KETONESUR 5 (A) 12/23/2020 1000   PROTEINUR NEGATIVE 12/23/2020 1000   UROBILINOGEN 0.2 10/10/2017 0858   NITRITE NEGATIVE 12/23/2020 1000   LEUKOCYTESUR  SMALL (A) 12/23/2020 1000    Radiological Exams on Admission: DG Chest 2 View  Result Date: 01/25/2021 CLINICAL DATA:  dyspnea EXAM: CHEST - 2 VIEW COMPARISON:  12/23/2020. FINDINGS: The heart size and mediastinal contours are within normal limits. Both lungs are clear. No visible pleural effusions or pneumothorax. No acute osseous abnormality. IMPRESSION: No active cardiopulmonary disease. Electronically Signed   By: Margaretha Sheffield M.D.   On: 01/25/2021 17:02   CT Head Wo Contrast  Result Date: 01/25/2021 CLINICAL DATA:  Transient ischemic attack (TIA) Dizziness, non-specific EXAM: CT HEAD WITHOUT CONTRAST TECHNIQUE: Contiguous axial images were obtained from the base of the skull through the vertex without intravenous contrast. COMPARISON:  CT head 10/02/2010. FINDINGS: Brain: No evidence of acute large vascular territory infarction, hemorrhage, hydrocephalus, extra-axial collection or mass lesion/mass effect. Vascular: No hyperdense vessel identified calcific intracranial atherosclerosis. Skull: No acute fracture. Sinuses/Orbits: Clear sinuses. No acute orbital findings. Other: No mastoid effusions. IMPRESSION: No evidence of acute intracranial abnormality. Electronically Signed   By: Margaretha Sheffield M.D.   On: 01/25/2021 17:00   CT Angio Chest PE W/Cm &/Or Wo Cm  Result Date: 01/25/2021 CLINICAL DATA:  dizzies and the inability to form sentences on Sunday. PE suspected, low/intermediate prob, positive D-dimer EXAM: CT ANGIOGRAPHY CHEST WITH CONTRAST TECHNIQUE: Multidetector CT imaging of the chest was performed using the standard protocol during bolus administration of intravenous contrast. Multiplanar CT image reconstructions and MIPs were obtained to evaluate the vascular anatomy. CONTRAST:  35m OMNIPAQUE IOHEXOL 350 MG/ML  SOLN COMPARISON:  CT chest 09/16/2020, CT abdomen pelvis 05/27/2006 FINDINGS: Cardiovascular: Satisfactory opacification of the pulmonary arteries to the segmental level.  No evidence of pulmonary embolism. Normal heart size. No pericardial effusion. Mild atherosclerotic plaque of the aorta. Left anterior descending coronary artery stent. Mediastinum/Nodes: No enlarged mediastinal, hilar, or axillary lymph nodes. Thyroid gland, trachea, and esophagus demonstrate no significant findings. Lungs/Pleura: No focal consolidation. Bilateral lower lobe subsegmental atelectasis. Stable right lower lobe punctate calcified micronodule likely sequelae of prior granulomatous disease (6:100). No pulmonary mass. No pleural effusion. No pneumothorax. Upper Abdomen: No acute abnormality. Musculoskeletal: No chest wall abnormality. No suspicious lytic or blastic osseous lesions. No acute displaced fracture. Review of the MIP images confirms the above findings. IMPRESSION: 1. No pulmonary embolus. 2. No acute intrathoracic abnormality. 3.  Aortic Atherosclerosis (ICD10-I70.0). Electronically Signed   By: Iven Finn M.D.   On: 01/25/2021 18:39    EKG: Independently reviewed. Sinus rhythm with chronic TWI's that are similar to previous 2019 ekg  Assessment/Plan Principal Problem:   TIA (transient ischemic attack) Active Problems:   Diabetes ([place])   HYPERCHOLESTEROLEMIA   Essential hypertension   Coronary atherosclerosis   Depressive disorder   Obesity, morbid (H[facility])   Personal history of DVT (deep vein thrombosis)   OSA (obstructive sleep apnea)   Slurring of words intermittently, word finding difficulties,  think possibly from sedating medication addition recently, possibly a complicated migraine and least likely a TIA but with the culmination of symptoms and his vascular comorbidities deserves a work up.   Neurology consulted, appreciate their help. telemetry, tte,  TSH, lipid, a1c asa 89m once empirically, cont' plavix and xarelto (he thinks takes in the evenings and hasn't taken on 9/13 so ordered now) MRI of the brain/Mra head and neck therapies, already at a cane with  his back surgery --encourage patient to stay hydrated  bppv - meclizine given in ED TMJ with lock jaw - supportive care  recent back surgery, pain - hold muscle relaxer for now, cont' gabapentin, decreased pain med to 588moxycodone from 10 percocet, therapies as above. ?Bromocriptine, need to confirm with wife. Mood - cymbalta and prozac OSA - cpap DM - SSI, hold metformin, jardiance and trulicity HTN - continue home meds PVD/CAD/h/o PE - xarelto, plavix h/o b12 deficiency - not on b12, will recheck morbid obesity- encouraged weight loss  Patient and/or Family completely agreed with the plan, expressed understanding and I answered all questions.  DVT prophylaxis: OnRW:[id number] Status: Full code Family Communication: will try to call wife Disposition Plan: likely home  Consults called: therapies Admission status: observation for now  A total of 75 minutes utilized during this admission.  [facility]   If 7PM-7AM, please contact night-coverage [email or web address] Password [email or web address]  01/26/2021, 1:52 AM

## 2021-01-26 NOTE — Progress Notes (Signed)
RT note. Patient placed on auto cpap 15/5 sat 96% and resting comfortable, RT will continue to monitor.

## 2021-01-27 DIAGNOSIS — G459 Transient cerebral ischemic attack, unspecified: Secondary | ICD-10-CM | POA: Diagnosis not present

## 2021-01-27 LAB — GLUCOSE, CAPILLARY: Glucose-Capillary: 151 mg/dL — ABNORMAL HIGH (ref 70–99)

## 2021-01-27 MED ORDER — CYANOCOBALAMIN 1000 MCG PO TABS: 1000.0000 ug | ORAL_TABLET | Freq: Two times a day (BID) | ORAL | 0 refills | Status: DC

## 2021-01-27 MED ORDER — ROSUVASTATIN CALCIUM 10 MG PO TABS: 10.0000 mg | ORAL_TABLET | Freq: Every day | ORAL | 0 refills | Status: DC

## 2021-01-27 NOTE — Discharge Instructions (Signed)
Follow with Primary MD McGowen, Maryjean Morn, MD in 7 days   Get CBC, CMP  -  checked next visit within 1 week by Primary MD    Activity: As tolerated with Full fall precautions use walker/cane & assistance as needed  Disposition Home    Diet: Heart Healthy Low Carb  Special Instructions: If you have smoked or chewed Tobacco  in the last 2 yrs please stop smoking, stop any regular Alcohol  and or any Recreational drug use.  On your next visit with your primary care physician please Get Medicines reviewed and adjusted.  Please request your Prim.MD to go over all Hospital Tests and Procedure/Radiological results at the follow up, please get all Hospital records sent to your Prim MD by signing hospital release before you go home.  If you experience worsening of your admission symptoms, develop shortness of breath, life threatening emergency, suicidal or homicidal thoughts you must seek medical attention immediately by calling 911 or calling your MD immediately  if symptoms less severe.  You Must read complete instructions/literature along with all the possible adverse reactions/side effects for all the Medicines you take and that have been prescribed to you. Take any new Medicines after you have completely understood and accpet all the possible adverse reactions/side effects.

## 2021-01-27 NOTE — Progress Notes (Signed)
Discussed discharge instructions with patient and wife. He verbalized understanding. Pt left with all belongings. Left unit in wheelchair with NT.

## 2021-01-27 NOTE — Evaluation (Signed)
Clinical/Bedside Swallow Evaluation Patient Details  Name: Xavier [name]. 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 Shon Baton 12/2020.   Chronic renal insufficiency, stage 3 (moderate) (HCC) 2022   GFR around 60   Colon cancer screening 10/27/2016   Cologuard NEG-->rpt 3 yrs   Complication of anesthesia    slow to wake up   CORONARY ARTERY DISEASE 12/05/2006   DIABETES MELLITUS, TYPE II dx'd 2001   DVT (deep venous thrombosis) (HCC) 2016   BLE   GERD (gastroesophageal reflux disease)    History of balanitis    x 2. Holding off on circ as of 05/2020 urol f/u   History of blood transfusion 2001   "when I had my hand OR"   History of gout    History of kidney stones    HYPERCHOLESTEROLEMIA 07/17/2007   HYPERTENSION 12/05/2006   Learning disability    Migraine    "1-2/month" (06/26/2016)   Myocardial infarction (HCC) ~ 2003   Obesity, Class II, BMI 35-39.9    OSA on CPAP    Xavier [name] cannot recall setting clearly but thinks it is 5 cm h20.     PE (pulmonary embolism) 2016   Peripheral vascular disease (HCC)    Simple renal cyst    X 2 on R kidney (one 5 cm and one 2 cm)   Stroke (HCC) ~ 2006   denies residual on 06/26/2016   TIA (transient ischemic attack) ?06/25/2016   VISUAL ACUITY, DECREASED, RIGHT EYE 02/19/2009   Past Surgical History:  Past Surgical History:  Procedure Laterality Date   CARDIAC CATHETERIZATION  1990s; 2012; 06/2016   06/2016 distal LAD DES stent.  EF 55-65%    CARDIOVASCULAR STRESS TEST  06/2016   Echo stress test->"inconclusive"-->cath was done after this   CATARACT EXTRACTION W/ INTRAOCULAR LENS IMPLANT Right 2017   CORONARY STENT INTERVENTION N/A 06/27/2016   Procedure: Coronary Stent Intervention;  Surgeon: Marykay Lex, MD;  Location: [facility] INVASIVE CV LAB;  Service: Cardiovascular;  Laterality: N/A;   EYE SURGERY Right    Cataract   INGUINAL HERNIA REPAIR Bilateral 1990s   INTRAVASCULAR PRESSURE WIRE/FFR STUDY N/A 06/27/2016   Procedure: Intravascular Pressure Wire/FFR Study;  Surgeon: Marykay Lex, MD;  Location: [facility] INVASIVE CV LAB;  Service: Cardiovascular;  Laterality: N/A;   LEFT HEART CATH AND CORONARY ANGIOGRAPHY N/A 06/27/2016   Procedure: Left Heart Cath and Coronary Angiography;  Surgeon: Marykay Lex, MD;  Location: [facility] INVASIVE CV LAB;  Service: Cardiovascular;  Laterality: N/A;   LUMBAR LAMINECTOMY/DECOMPRESSION MICRODISCECTOMY Left 01/05/2021   Procedure: Left L4-5 Gill Decompression;  Surgeon: Venita Lick, MD;  Location: [facility] OR;  Service: Orthopedics;  Laterality: Left;   REATTACHMENT HAND Left ~ 2001   w/carpal tunnel release   UMBILICAL HERNIA REPAIR  1990s   w/IHR   HPI:  Xavier [name] is a 55 y.o. male who was admitted d/t concern for TIA after after several days of word-finding difficulty, jaw locking, blurry vision, dizziness,  tinnitus. MRI brain normal. PMH: DM, HLD, HTN, pulmonary embolism, DVT on xarelto, obesity, OSA on CPAP, CAD s/p stents still on plavix, recent spinal surgery on 8/24 and has been on muscle relaxers, opioids intermittently and has a history of migraines of which he has been having more recently with nausea and photophobia.   Assessment / Plan / Recommendation Clinical Impression  Xavier [name] was seen for bedside swallow evaluation and he denied a history of dysphagia or any acute changes. Oral mechanism exam was [facility] and he presented with adequate, natural dentition. He tolerated all solids and liquids without signs  or symptoms of oropharyngeal dysphagia. A regular texture diet with thin liquids is recommended at this time. Xavier [name] reported that his speech and language skills have returned to baseline . Further skilled SLP services are not clinically indicated at this time. SLP Visit Diagnosis: Dysphagia, unspecified (R13.10)    Aspiration Risk  No limitations    Diet Recommendation Regular;Thin liquid   Liquid Administration via: Cup;Straw Medication Administration: Whole meds with liquid Supervision: Patient able to self feed Postural Changes: Seated upright at 90 degrees    Other  Recommendations Oral Care Recommendations: Oral care BID   Follow up Recommendations None      Frequency and Duration            Prognosis Prognosis for Safe Diet Advancement: Good      Swallow Study   General Date of Onset: 01/26/21 HPI: Xavier [name] is a 55 y.o. male who was admitted d/t concern for TIA after after several days of word-finding difficulty, jaw locking, blurry vision, dizziness, tinnitus. MRI brain normal. PMH: DM, HLD, HTN, pulmonary embolism, DVT on xarelto, obesity, OSA on CPAP, CAD s/p stents still on plavix, recent spinal surgery on 8/24 and has been on muscle relaxers, opioids intermittently and has a history of migraines of which he has been having more recently with nausea and photophobia. Type of Study: Bedside Swallow Evaluation Previous Swallow Assessment: none Diet Prior to this Study: Regular;Thin liquids Temperature Spikes Noted: No Respiratory Status: Room air History of Recent Intubation: No Behavior/Cognition: Alert;Cooperative;Pleasant mood Oral Cavity Assessment: Within Functional Limits Oral Care Completed by SLP: No Oral Cavity - Dentition: Adequate natural dentition Vision: Functional for self-feeding Self-Feeding Abilities: Able to feed self Patient Positioning: Upright in bed;Postural control adequate for testing Baseline Vocal Quality: Normal Volitional Cough: Strong Volitional  Swallow: Able to elicit    Oral/Motor/Sensory Function Overall Oral Motor/Sensory Function: Within functional limits   Ice Chips Ice chips: Within functional limits Presentation: Spoon   Thin Liquid Thin Liquid: Within functional limits Presentation: Straw    Nectar Thick Nectar Thick Liquid: Not tested   Honey Thick Honey Thick Liquid: Not tested   Puree Puree: Within functional limits Presentation: Spoon   Solid     Solid: Within functional limits Presentation: Self Fed     Kenetha Cozza I. Vear Clock, MS, CCC-SLP Acute Rehabilitation Services Office number [phone] Pager [phone]  Scheryl Marten 01/27/2021,9:31 AM

## 2021-01-27 NOTE — Plan of Care (Signed)
Neurology Plan of Care  Labs reviewed: TSH 2.73, A1c at goal 6.6%, LDL at goal 47 Lab Results  Component Value Date   VITAMINB12 140 (L) 01/26/2021   Imaging reviewed: Echocardiogram 01/26/2021: 1. Left ventricular ejection fraction, by estimation, is 60 to 65%. The  left ventricle has normal function. The left ventricle has no regional  wall motion abnormalities. There is moderate left ventricular hypertrophy.  Left ventricular diastolic parameters are consistent with Grade I diastolic dysfunction (impaired relaxation).   2. Right ventricular systolic function is normal. The right ventricular  size is normal. There is normal pulmonary artery systolic pressure.   3. The mitral valve is normal in structure. No evidence of mitral valve  regurgitation. No evidence of mitral stenosis.   4. The aortic valve is tricuspid. Aortic valve regurgitation is not  visualized. No aortic stenosis is present.   5. The inferior vena cava is normal in size with greater than 50%  respiratory variability, suggesting right atrial pressure of 3 mmHg.   Brain MRI: Normal.   Intracranial MRA: Negative.   Neck MRA: 1. Tortuous vessels without flow limiting stenosis or ulceration in the neck. 2. Equivocal stenosis of the non dominant left V4 segment near the PICA origin.  Recommendations: #TIA work up - MRI brain negative, vessel imaging without flow limiting stenosis or occlusions;  - Presentation concerning for trigeminal neuralgia, less likely TIA - If trigeminal neuralgia pain persists, can discuss with outpatient neurology or PCP to initiate Carbamazepine 250 mg BID and titrate as needed - Continue Plavix 75 mg daily (coronary artery disease indication) - Continue Eliquis 5 mg twice daily  # History of pernicious anemia - B12 supplementation 1,000 mcg IM weekly for one month followed by monthly; follow up with repeat levels in one month with PCP, goal B12 > 400  #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 Thomasena Edis who is in agreement Xavier [name], AGACNP-BC [facility]  [phone]

## 2021-01-27 NOTE — Discharge Summary (Signed)
Xavier [name]. MMH:[id number] DOB: 1965/10/11 DOA: 01/25/2021  PCP: Tammi Sou, MD  Admit date: 01/25/2021  Discharge date: 01/27/2021  Admitted From: Home  Disposition:  Home   Recommendations for Outpatient Follow-up:   Follow up with PCP in 1-2 weeks  PCP Please obtain BMP/CBC, 2 view CXR in 1week,  (see Discharge instructions)   PCP Please follow up on the following pending results: Monitor CBC, CMP, B12, TSH, LDL and A1c closely.   Home Health: None   Equipment/Devices: None  Consultations: Neuro Discharge Condition: Stable    CODE STATUS: Full    Diet Recommendation: Heart Healthy - Low Carb    Chief Complaint  Patient presents with   Dizziness     Brief history of present illness from the day of admission and additional interim summary    Xavier [name]. is a 55 y.o. male with a pertinent history of DM, HLD, HTN, pulmonary embolism, DVT on xarelto, obesity, OSA on cpap, CAD s/p stents still on plavix, recent spinal surgery on 8/24 and has been on muscle relaxers and some narcotics, at home he developed some slurred speech a few days ago along with some jaw pain and locked jaw for a few hours.  Came to the ER where initial head CT was negative and he was admitted for further stroke/TIA work-up.                                                                 Hospital Course    Slurred speech, some expressive aphasia and one episode of lockjaw - he could have dystonic activity causing lockjaw however slurred speech and expressive aphasia could have been TIA versus side effect of narcotics, MRI brain negative, then cleared by speech, all symptoms have resolved, echo stable,, he is currently on combination of statin, Plavix and Eliquis which will be continued, echocardiogram is stable as well, LDL  was slightly above goal hence Crestor was doubled, A1c stable, case discussed with neurologist Dr. Leonie Man no further work-up stable to be discharged home, currently no focal deficits, speech back to normal, will advise outpatient follow-up with neurology, will also discontinue narcotics which he was recently exposed to and could have potentially caused the symptoms versus TIA.   2.  Recent spine surgery.  Minimize narcotics and muscle relaxants, post op scar site appears stable.  After discharge follow-up with Dr. Rolena Infante.   3.  Obesity with OSA.  BMI of 33, follow with PCP for weight loss, CPAP nightly.   4.  History of DVT.  Continue Xarelto.   5.  CAD and dyslipidemia.  Continue combination of Plavix and statin for secondary prevention.   6.  History of pernicious anemia with vitamin B12 deficiency.  Placed on supplementation.  We will request PCP to  monitor blood levels.  7.  Dyslipidemia.  LDL was slightly above goal will increase Crestor dose.  PCP to monitor.  Discharge diagnosis     Principal Problem:   TIA (transient ischemic attack) Active Problems:   Diabetes ([place])   HYPERCHOLESTEROLEMIA   Essential hypertension   Coronary atherosclerosis   Depressive disorder   Obesity, morbid (HCC)   Personal history of DVT (deep vein thrombosis)   OSA (obstructive sleep apnea)    Discharge instructions    Discharge Instructions     Discharge instructions   Complete by: As directed    Follow with Primary MD Tammi Sou, MD in 7 days   Get CBC, CMP  -  checked next visit within 1 week by Primary MD    Activity: As tolerated with Full fall precautions use walker/cane & assistance as needed  Disposition Home    Diet: Heart Healthy Low Carb  Special Instructions: If you have smoked or chewed Tobacco  in the last 2 yrs please stop smoking, stop any regular Alcohol  and or any Recreational drug use.  On your next visit with your primary care physician please Get Medicines  reviewed and adjusted.  Please request your Prim.MD to go over all Hospital Tests and Procedure/Radiological results at the follow up, please get all Hospital records sent to your Prim MD by signing hospital release before you go home.  If you experience worsening of your admission symptoms, develop shortness of breath, life threatening emergency, suicidal or homicidal thoughts you must seek medical attention immediately by calling 911 or calling your MD immediately  if symptoms less severe.  You Must read complete instructions/literature along with all the possible adverse reactions/side effects for all the Medicines you take and that have been prescribed to you. Take any new Medicines after you have completely understood and accpet all the possible adverse reactions/side effects.   Discharge wound care:   Complete by: As directed    Keep your postop site clean and dry at all times   Increase activity slowly   Complete by: As directed        Discharge Medications   Allergies as of 01/27/2021       Reactions   Actos [pioglitazone] Other (See Comments)   Potential cause of DVT   Bee Venom Anaphylaxis   Klonopin [clonazepam] Anaphylaxis   Invokana [canagliflozin] Nausea Only   Nausea/dizzy/bad taste in mouth   Niacin Other (See Comments)   "makes his crazy"        Medication List     STOP taking these medications    oxyCODONE-acetaminophen 10-325 MG tablet Commonly known as: PERCOCET       TAKE these medications    Accu-Chek Guide Me w/Device Kit USE TO CHECK BLOOD SUGAR 1 TIME PER DAY. DX CODE: E11.9   Accu-Chek Guide test strip Generic drug: glucose blood check your blood sugar once a day   bromocriptine 2.5 MG tablet Commonly known as: PARLODEL Take 1 tablet (2.5 mg total) by mouth daily. What changed: how much to take   clopidogrel 75 MG tablet Commonly known as: PLAVIX TAKE 1 TABLET BY MOUTH EVERY DAY   cyanocobalamin 1000 MCG tablet Take 1 tablet  (1,000 mcg total) by mouth 2 (two) times daily. Start taking on: January 28, 2021   DULoxetine 30 MG capsule Commonly known as: CYMBALTA Take 30 mg by mouth daily.   EPINEPHrine 0.3 mg/0.3 mL Soaj injection Commonly known as: EPI-PEN Inject 0.3 mLs (0.3 mg  total) into the muscle as needed for anaphylaxis.   fenofibrate 145 MG tablet Commonly known as: TRICOR TAKE 1 TABLET BY MOUTH EVERY DAY   FLUoxetine 40 MG capsule Commonly known as: PROZAC Take 40 mg by mouth daily.   fluticasone 50 MCG/ACT nasal spray Commonly known as: FLONASE USE ONE SPRAY IN EACH NOSTRIL TWICE A DAY What changed: See the new instructions.   gabapentin 300 MG capsule Commonly known as: NEURONTIN Take 300 mg by mouth 2 (two) times daily.   hydrochlorothiazide 12.5 MG tablet Commonly known as: HYDRODIURIL TAKE 1 TABLET BY MOUTH EVERY DAY WITH THE LOSARTAN   Jardiance 25 MG Tabs tablet Generic drug: empagliflozin TAKE 1 TABLET BY MOUTH EVERY DAY   losartan 100 MG tablet Commonly known as: COZAAR TAKE 1/2 TABLET BY MOUTH EVERY DAY   metFORMIN 500 MG 24 hr tablet Commonly known as: GLUCOPHAGE-XR TAKE 2 TABLETS BY MOUTH TWICE A DAY   methocarbamol 500 MG tablet Commonly known as: ROBAXIN Take 500 mg by mouth 2 (two) times daily.   nitroGLYCERIN 0.4 MG SL tablet Commonly known as: NITROSTAT Place 1 tablet (0.4 mg total) under the tongue every 5 (five) minutes as needed for chest pain.   ondansetron 4 MG tablet Commonly known as: Zofran Take 1 tablet (4 mg total) by mouth every 8 (eight) hours as needed for nausea or vomiting.   rosuvastatin 10 MG tablet Commonly known as: Crestor Take 1 tablet (10 mg total) by mouth daily. What changed:  medication strength how much to take   Trulicity 4.5 NL/9.7QB Sopn Generic drug: Dulaglutide Inject 4.5 mg as directed once a week.   Xarelto 20 MG Tabs tablet Generic drug: rivaroxaban TAKE 1 TABLET BY MOUTH EVERY DAY BEFORE SUPPER                Discharge Care Instructions  (From admission, onward)           Start     Ordered   01/27/21 0000  Discharge wound care:       Comments: Keep your postop site clean and dry at all times   01/27/21 0944             Follow-up Information     McGowen, Adrian Blackwater, MD. Schedule an appointment as soon as possible for a visit in 1 week(s).   Specialty: Family Medicine Contact information: [address] [place] [address] [phone]         Josue Hector, MD .   Specialty: Cardiology Contact information: [phone] [place] [address] [phone]         [place] Schedule an appointment as soon as possible for a visit in 1 week(s).   Why: ? TIA Contact information: [address]     [address] [place] [address] [phone]                Major procedures and Radiology Reports - PLEASE review detailed and final reports thoroughly  -       DG Chest 2 View  Result Date: 01/25/2021 CLINICAL DATA:  dyspnea EXAM: CHEST - 2 VIEW COMPARISON:  12/23/2020. FINDINGS: The heart size and mediastinal contours are within normal limits. Both lungs are clear. No visible pleural effusions or pneumothorax. No acute osseous abnormality. IMPRESSION: No active cardiopulmonary disease. Electronically Signed   By: Margaretha Sheffield M.D.   On: 01/25/2021 17:02   DG Lumbar Spine 2-3 Views  Result Date: 01/05/2021 CLINICAL DATA:  L4-L5 decompression EXAM: LUMBAR SPINE - 2-3 VIEW COMPARISON:  None. FINDINGS: Two lateral intraoperative views lumbar spine provided. Initial view demonstrates sharp tip probes posterior to the L5 and S1 vertebral bodies. Second film demonstrates a curved tip probe at the L4-L5 disc space. IMPRESSION: Intraoperative views as above. Findings related to operating room at time of interpretation Electronically Signed   By: Suzy Bouchard M.D.   On: 01/05/2021 13:36   CT  Head Wo Contrast  Result Date: 01/25/2021 CLINICAL DATA:  Transient ischemic attack (TIA) Dizziness, non-specific EXAM: CT HEAD WITHOUT CONTRAST TECHNIQUE: Contiguous axial images were obtained from the base of the skull through the vertex without intravenous contrast. COMPARISON:  CT head 10/02/2010. FINDINGS: Brain: No evidence of acute large vascular territory infarction, hemorrhage, hydrocephalus, extra-axial collection or mass lesion/mass effect. Vascular: No hyperdense vessel identified calcific intracranial atherosclerosis. Skull: No acute fracture. Sinuses/Orbits: Clear sinuses. No acute orbital findings. Other: No mastoid effusions. IMPRESSION: No evidence of acute intracranial abnormality. Electronically Signed   By: Margaretha Sheffield M.D.   On: 01/25/2021 17:00   CT Angio Chest PE W/Cm &/Or Wo Cm  Result Date: 01/25/2021 CLINICAL DATA:  dizzies and the inability to form sentences on Sunday. PE suspected, low/intermediate prob, positive D-dimer EXAM: CT ANGIOGRAPHY CHEST WITH CONTRAST TECHNIQUE: Multidetector CT imaging of the chest was performed using the standard protocol during bolus administration of intravenous contrast. Multiplanar CT image reconstructions and MIPs were obtained to evaluate the vascular anatomy. CONTRAST:  20mL OMNIPAQUE IOHEXOL 350 MG/ML SOLN COMPARISON:  CT chest 09/16/2020, CT abdomen pelvis 05/27/2006 FINDINGS: Cardiovascular: Satisfactory opacification of the pulmonary arteries to the segmental level. No evidence of pulmonary embolism. Normal heart size. No pericardial effusion. Mild atherosclerotic plaque of the aorta. Left anterior descending coronary artery stent. Mediastinum/Nodes: No enlarged mediastinal, hilar, or axillary lymph nodes. Thyroid gland, trachea, and esophagus demonstrate no significant findings. Lungs/Pleura: No focal consolidation. Bilateral lower lobe subsegmental atelectasis. Stable right lower lobe punctate calcified micronodule likely sequelae of  prior granulomatous disease (6:100). No pulmonary mass. No pleural effusion. No pneumothorax. Upper Abdomen: No acute abnormality. Musculoskeletal: No chest wall abnormality. No suspicious lytic or blastic osseous lesions. No acute displaced fracture. Review of the MIP images confirms the above findings. IMPRESSION: 1. No pulmonary embolus. 2. No acute intrathoracic abnormality. 3.  Aortic Atherosclerosis (ICD10-I70.0). Electronically Signed   By: Iven Finn M.D.   On: 01/25/2021 18:39   MR ANGIO HEAD WO CONTRAST  Result Date: 01/26/2021 CLINICAL DATA:  Difficulty opening jaw with slurred speech and dizziness EXAM: MRI HEAD WITHOUT CONTRAST MRA HEAD WITHOUT CONTRAST MRA OF THE NECK WITHOUT AND WITH CONTRAST TECHNIQUE: Multiplanar, multi-echo pulse sequences of the brain and surrounding structures were acquired without intravenous contrast. Angiographic images of the Circle of Willis were acquired using MRA technique without intravenous contrast. Angiographic images of the neck were acquired using MRA technique without and with intravenous contrast. Carotid stenosis measurements (when applicable) are obtained utilizing NASCET criteria, using the distal internal carotid diameter as the denominator. CONTRAST:  54mL GADAVIST GADOBUTROL 1 MMOL/ML IV SOLN COMPARISON:  Head CT from yesterday FINDINGS: MR HEAD FINDINGS Brain: No acute infarction, hemorrhage, hydrocephalus, extra-axial collection or mass lesion. No generalized white matter disease or atrophy Vascular: Normal flow voids. Skull and upper cervical spine: Normal marrow signal. Sinuses/Orbits: Right cataract resection. Essentially clear paranasal sinuses. MRA HEAD FINDINGS Anterior circulation: Vessels are smooth and widely patent. Negative for aneurysm Posterior circulation: Vessels are smooth  and widely patent. Negative for aneurysm. Anatomic variants:Fetal type left PCA. MRA NECK FINDINGS Aortic arch: No proximal subclavian stenosis Right carotid  system: ICA tortuosity with looping. No beading, stenosis, or ulceration. Left carotid system: ICA tortuosity with looping. No beading, stenosis, or ulceration. Vertebral arteries: The subclavian vessels are smoothly contoured and widely patent. The right vertebral artery is strongly dominant. No vertebral stenosis or convincing beading in the neck. The left V4 segment essentially ends in the PICA with possible stenosis near the PICA origin. IMPRESSION: Brain MRI: Normal. Intracranial MRA: Negative. Neck MRA: 1. Tortuous vessels without flow limiting stenosis or ulceration in the neck. 2. Equivocal stenosis of the non dominant left V4 segment near the PICA origin. Electronically Signed   By: Jorje Guild M.D.   On: 01/26/2021 07:22   MR MRA NECK W CONTRAST  Result Date: 01/26/2021 CLINICAL DATA:  Difficulty opening jaw with slurred speech and dizziness EXAM: MRI HEAD WITHOUT CONTRAST MRA HEAD WITHOUT CONTRAST MRA OF THE NECK WITHOUT AND WITH CONTRAST TECHNIQUE: Multiplanar, multi-echo pulse sequences of the brain and surrounding structures were acquired without intravenous contrast. Angiographic images of the Circle of Willis were acquired using MRA technique without intravenous contrast. Angiographic images of the neck were acquired using MRA technique without and with intravenous contrast. Carotid stenosis measurements (when applicable) are obtained utilizing NASCET criteria, using the distal internal carotid diameter as the denominator. CONTRAST:  31mL GADAVIST GADOBUTROL 1 MMOL/ML IV SOLN COMPARISON:  Head CT from yesterday FINDINGS: MR HEAD FINDINGS Brain: No acute infarction, hemorrhage, hydrocephalus, extra-axial collection or mass lesion. No generalized white matter disease or atrophy Vascular: Normal flow voids. Skull and upper cervical spine: Normal marrow signal. Sinuses/Orbits: Right cataract resection. Essentially clear paranasal sinuses. MRA HEAD FINDINGS Anterior circulation: Vessels are smooth  and widely patent. Negative for aneurysm Posterior circulation: Vessels are smooth and widely patent. Negative for aneurysm. Anatomic variants:Fetal type left PCA. MRA NECK FINDINGS Aortic arch: No proximal subclavian stenosis Right carotid system: ICA tortuosity with looping. No beading, stenosis, or ulceration. Left carotid system: ICA tortuosity with looping. No beading, stenosis, or ulceration. Vertebral arteries: The subclavian vessels are smoothly contoured and widely patent. The right vertebral artery is strongly dominant. No vertebral stenosis or convincing beading in the neck. The left V4 segment essentially ends in the PICA with possible stenosis near the PICA origin. IMPRESSION: Brain MRI: Normal. Intracranial MRA: Negative. Neck MRA: 1. Tortuous vessels without flow limiting stenosis or ulceration in the neck. 2. Equivocal stenosis of the non dominant left V4 segment near the PICA origin. Electronically Signed   By: Jorje Guild M.D.   On: 01/26/2021 07:22   MR BRAIN WO CONTRAST  Result Date: 01/26/2021 CLINICAL DATA:  Difficulty opening jaw with slurred speech and dizziness EXAM: MRI HEAD WITHOUT CONTRAST MRA HEAD WITHOUT CONTRAST MRA OF THE NECK WITHOUT AND WITH CONTRAST TECHNIQUE: Multiplanar, multi-echo pulse sequences of the brain and surrounding structures were acquired without intravenous contrast. Angiographic images of the Circle of Willis were acquired using MRA technique without intravenous contrast. Angiographic images of the neck were acquired using MRA technique without and with intravenous contrast. Carotid stenosis measurements (when applicable) are obtained utilizing NASCET criteria, using the distal internal carotid diameter as the denominator. CONTRAST:  60mL GADAVIST GADOBUTROL 1 MMOL/ML IV SOLN COMPARISON:  Head CT from yesterday FINDINGS: MR HEAD FINDINGS Brain: No acute infarction, hemorrhage, hydrocephalus, extra-axial collection or mass lesion. No generalized white matter  disease or atrophy Vascular: Normal flow voids. Skull  and upper cervical spine: Normal marrow signal. Sinuses/Orbits: Right cataract resection. Essentially clear paranasal sinuses. MRA HEAD FINDINGS Anterior circulation: Vessels are smooth and widely patent. Negative for aneurysm Posterior circulation: Vessels are smooth and widely patent. Negative for aneurysm. Anatomic variants:Fetal type left PCA. MRA NECK FINDINGS Aortic arch: No proximal subclavian stenosis Right carotid system: ICA tortuosity with looping. No beading, stenosis, or ulceration. Left carotid system: ICA tortuosity with looping. No beading, stenosis, or ulceration. Vertebral arteries: The subclavian vessels are smoothly contoured and widely patent. The right vertebral artery is strongly dominant. No vertebral stenosis or convincing beading in the neck. The left V4 segment essentially ends in the PICA with possible stenosis near the PICA origin. IMPRESSION: Brain MRI: Normal. Intracranial MRA: Negative. Neck MRA: 1. Tortuous vessels without flow limiting stenosis or ulceration in the neck. 2. Equivocal stenosis of the non dominant left V4 segment near the PICA origin. Electronically Signed   By: Jorje Guild M.D.   On: 01/26/2021 07:22   ECHOCARDIOGRAM COMPLETE  Result Date: 01/26/2021    ECHOCARDIOGRAM REPORT   Patient Name:   Saxton Chain. Date of Exam: 01/26/2021 Medical Rec #:  [id number]         Height:       71.0 in Accession #:    [id number]        Weight:       239.4 lb Date of Birth:  12-14-1965         BSA:          2.276 m Patient Age:    55 years          BP:           127/87 mmHg Patient Gender: M                 HR:           67 bpm. Exam Location:  Inpatient Procedure: 2D Echo, Cardiac Doppler and Color Doppler Indications:    TIA  History:        Patient has prior history of Echocardiogram examinations, most                 recent 11/26/2013. Risk Factors:Diabetes and Hypertension.  Sonographer:    MH Referring Phys: [id number]  [place]  1. Left ventricular ejection fraction, by estimation, is 60 to 65%. The left ventricle has normal function. The left ventricle has no regional wall motion abnormalities. There is moderate left ventricular hypertrophy. Left ventricular diastolic parameters are consistent with Grade I diastolic dysfunction (impaired relaxation).  2. Right ventricular systolic function is normal. The right ventricular size is normal. There is normal pulmonary artery systolic pressure.  3. The mitral valve is normal in structure. No evidence of mitral valve regurgitation. No evidence of mitral stenosis.  4. The aortic valve is tricuspid. Aortic valve regurgitation is not visualized. No aortic stenosis is present.  5. The inferior vena cava is normal in size with greater than 50% respiratory variability, suggesting right atrial pressure of 3 mmHg. FINDINGS  Left Ventricle: Left ventricular ejection fraction, by estimation, is 60 to 65%. The left ventricle has normal function. The left ventricle has no regional wall motion abnormalities. The left ventricular internal cavity size was normal in size. There is  moderate left ventricular hypertrophy. Left ventricular diastolic parameters are consistent with Grade I diastolic dysfunction (impaired relaxation). Right Ventricle: The right ventricular size is normal. No increase in right ventricular wall thickness. Right ventricular  systolic function is normal. There is normal pulmonary artery systolic pressure. The tricuspid regurgitant velocity is 1.37 m/s, and  with an assumed right atrial pressure of 3 mmHg, the estimated right ventricular systolic pressure is 81.8 mmHg. Left Atrium: Left atrial size was normal in size. Right Atrium: Right atrial size was normal in size. Pericardium: There is no evidence of pericardial effusion. Mitral Valve: The mitral valve is normal in structure. No evidence of mitral valve regurgitation. No evidence of mitral valve stenosis.  Tricuspid Valve: The tricuspid valve is normal in structure. Tricuspid valve regurgitation is trivial. No evidence of tricuspid stenosis. Aortic Valve: The aortic valve is tricuspid. Aortic valve regurgitation is not visualized. No aortic stenosis is present. Aortic valve mean gradient measures 2.0 mmHg. Aortic valve peak gradient measures 3.9 mmHg. Aortic valve area, by VTI measures 2.04 cm. Pulmonic Valve: The pulmonic valve was normal in structure. Pulmonic valve regurgitation is not visualized. No evidence of pulmonic stenosis. Aorta: The aortic root is normal in size and structure. Venous: The inferior vena cava is normal in size with greater than 50% respiratory variability, suggesting right atrial pressure of 3 mmHg. IAS/Shunts: No atrial level shunt detected by color flow Doppler.  LEFT VENTRICLE PLAX 2D LVIDd:         4.60 cm     Diastology LVIDs:         3.00 cm     LV e' medial:    12.20 cm/s LV PW:         1.24 cm     LV E/e' medial:  6.3 LV IVS:        1.10 cm     LV e' lateral:   6.53 cm/s LVOT diam:     2.00 cm     LV E/e' lateral: 11.8 LV SV:         45 LV SV Index:   20 LVOT Area:     3.14 cm  LV Volumes (MOD) LV vol d, MOD A4C: 59.6 ml LV vol s, MOD A4C: 24.1 ml LV SV MOD A4C:     59.6 ml RIGHT VENTRICLE             IVC RV S prime:     12.80 cm/s  IVC diam: 1.80 cm TAPSE (M-mode): 2.4 cm LEFT ATRIUM             Index       RIGHT ATRIUM          Index LA diam:        2.40 cm 1.05 cm/m  RA Area:     8.55 cm LA Vol (A2C):   26.3 ml 11.55 ml/m RA Volume:   13.60 ml 5.98 ml/m LA Vol (A4C):   30.4 ml 13.36 ml/m LA Biplane Vol: 29.7 ml 13.05 ml/m  AORTIC VALVE                   PULMONIC VALVE AV Area (Vmax):    2.16 cm    PV Vmax:       0.58 m/s AV Area (Vmean):   2.20 cm    PV Peak grad:  1.4 mmHg AV Area (VTI):     2.04 cm AV Vmax:           99.00 cm/s AV Vmean:          74.800 cm/s AV VTI:            0.220 m AV Peak Grad:  3.9 mmHg AV Mean Grad:      2.0 mmHg LVOT Vmax:         68.00  cm/s LVOT Vmean:        52.400 cm/s LVOT VTI:          0.143 m LVOT/AV VTI ratio: 0.65  AORTA Ao Root diam: 3.30 cm Ao Asc diam:  3.40 cm MITRAL VALVE               TRICUSPID VALVE MV Area (PHT): 1.88 cm    TR Peak grad:   7.5 mmHg MV Decel Time: 404 msec    TR Vmax:        137.00 cm/s MV E velocity: 77.10 cm/s MV A velocity: 83.90 cm/s  SHUNTS MV E/A ratio:  0.92        Systemic VTI:  0.14 m                            Systemic Diam: 2.00 cm Skeet Latch MD Electronically signed by Skeet Latch MD Signature Date/Time: 01/26/2021/1:14:24 PM    Final       Today   Subjective    Donnie Mes[place] today has no headache,no chest abdominal pain,no new weakness tingling or numbness, feels much better wants to go home today.   Objective   Blood pressure 130/89, pulse 75, temperature 98 F (36.7 C), temperature source Oral, resp. rate 14, height _0  (1.803 m), weight 108.6 kg, SpO2 95 %.   Intake/Output Summary (Last 24 hours) at 01/27/2021 0951 Last data filed at 01/27/2021 0739 Gross per 24 hour  Intake 480 ml  Output 2650 ml  Net -2170 ml    Exam  Awake Alert, No new F.N deficits, Normal affect [place].AT,PERRAL Supple Neck,No JVD, No cervical lymphadenopathy appriciated.  Symmetrical Chest wall movement, Good air movement bilaterally, CTAB RRR,No Gallops,Rubs or new Murmurs, No Parasternal Heave +ve B.Sounds, Abd Soft, Non tender, No organomegaly appriciated, No rebound -guarding or rigidity. No Cyanosis, Clubbing or edema, L spine post op site stable   Data Review   CBC w Diff:  Lab Results  Component Value Date   WBC 7.5 01/26/2021   HGB 13.6 01/26/2021   HGB 14.0 07/12/2016   HCT 39.5 01/26/2021   HCT 39.6 07/12/2016   PLT 465 (H) 01/26/2021   PLT 399 (H) 07/12/2016   LYMPHOPCT 20 01/25/2021   MONOPCT 6 01/25/2021   EOSPCT 2 01/25/2021   BASOPCT 1 01/25/2021    CMP:  Lab Results  Component Value Date   NA 134 (L) 01/26/2021   NA 137 07/12/2016   K 3.4 (L)  01/26/2021   CL 101 01/26/2021   CO2 24 01/26/2021   BUN 17 01/26/2021   BUN 17 07/12/2016   CREATININE 1.06 01/26/2021   CREATININE 1.24 01/17/2019   PROT 7.6 01/25/2021   ALBUMIN 4.5 01/25/2021   BILITOT 0.3 01/25/2021   ALKPHOS 35 (L) 01/25/2021   AST 13 (L) 01/25/2021   ALT 18 01/25/2021  . Lab Results  Component Value Date   HGBA1C 6.6 (H) 01/26/2021   Lab Results  Component Value Date   CHOL 132 01/26/2021   HDL 53 01/26/2021   LDLCALC 47 01/26/2021   LDLDIRECT 82.0 11/04/2018   TRIG 159 (H) 01/26/2021   CHOLHDL 2.5 01/26/2021     Total Time in preparing paper work, data evaluation and todays exam - 51 minutes  Lala Lund M.D on 01/27/2021 at  9:51 AM  [facility]

## 2021-02-01 ENCOUNTER — Encounter: Payer: Self-pay | Admitting: Family Medicine

## 2021-02-02 ENCOUNTER — Encounter: Payer: Self-pay | Admitting: Neurology

## 2021-02-02 ENCOUNTER — Ambulatory Visit (INDEPENDENT_AMBULATORY_CARE_PROVIDER_SITE_OTHER): Payer: Medicare Other | Admitting: Neurology

## 2021-02-02 VITALS — BP 128/81 | HR 79 | Ht 71.0 in | Wt 244.4 lb

## 2021-02-02 DIAGNOSIS — I2510 Atherosclerotic heart disease of native coronary artery without angina pectoris: Secondary | ICD-10-CM | POA: Diagnosis not present

## 2021-02-02 DIAGNOSIS — G459 Transient cerebral ischemic attack, unspecified: Secondary | ICD-10-CM

## 2021-02-02 DIAGNOSIS — R6884 Jaw pain: Secondary | ICD-10-CM | POA: Diagnosis not present

## 2021-02-02 NOTE — Patient Instructions (Signed)
I had a long d/w patient and his wife about his recent TIA and episode of jaw pain, risk for recurrent stroke/TIAs, personally independently reviewed imaging studies and stroke evaluation results and answered questions.Prolonged cardiac monitoring was not done since patient had an alternative reason to be on long-term anticoagulation.  Continue clopidogrel 75 mg daily and Xarelto (rivaroxaban) daily  for his TIA and history of DVT and pulmonary embolism for secondary stroke prevention and maintain strict control of hypertension with blood pressure goal below 130/90, diabetes with hemoglobin A1c goal below 6.5% and lipids with LDL cholesterol goal below 70 mg/dL. I also advised the patient to eat a healthy diet with plenty of whole grains, cereals, fruits and vegetables, exercise regularly and maintain ideal body weight his jaw pain has resolved and was of unclear etiology if it recurs I recommend he see her dentist.  Followup in the future with my nurse practitioner Shanda Bumps in 1 year or call earlier if necessary. Stroke Prevention Some medical conditions and behaviors are associated with a higher chance of having a stroke. You can help prevent a stroke by making nutrition, lifestyle, and other changes, including managing any medical conditions you may have. What nutrition changes can be made?  Eat healthy foods. You can do this by: Choosing foods high in fiber, such as fresh fruits and vegetables and whole grains. Eating at least 5 or more servings of fruits and vegetables a day. Try to fill half of your plate at each meal with fruits and vegetables. Choosing lean protein foods, such as lean cuts of meat, poultry without skin, fish, tofu, beans, and nuts. Eating low-fat dairy products. Avoiding foods that are high in salt (sodium). This can help lower blood pressure. Avoiding foods that have saturated fat, trans fat, and cholesterol. This can help prevent high cholesterol. Avoiding processed and premade  foods. Follow your health care provider's specific guidelines for losing weight, controlling high blood pressure (hypertension), lowering high cholesterol, and managing diabetes. These may include: Reducing your daily calorie intake. Limiting your daily sodium intake to 1,500 milligrams (mg). Using only healthy fats for cooking, such as olive oil, canola oil, or sunflower oil. Counting your daily carbohydrate intake. What lifestyle changes can be made? Maintain a healthy weight. Talk to your health care provider about your ideal weight. Get at least 30 minutes of moderate physical activity at least 5 days a week. Moderate activity includes brisk walking, biking, and swimming. Do not use any products that contain nicotine or tobacco, such as cigarettes and e-cigarettes. If you need help quitting, ask your health care provider. It may also be helpful to avoid exposure to secondhand smoke. Limit alcohol intake to no more than 1 drink a day for nonpregnant women and 2 drinks a day for men. One drink equals 12 oz of beer, 5 oz of wine, or 1 oz of hard liquor. Stop any illegal drug use. Avoid taking birth control pills. Talk to your health care provider about the risks of taking birth control pills if: You are over 55 years old. You smoke. You get migraines. You have ever had a blood clot. What other changes can be made? Manage your cholesterol levels. Eating a healthy diet is important for preventing high cholesterol. If cholesterol cannot be managed through diet alone, you may also need to take medicines. Take any prescribed medicines to control your cholesterol as told by your health care provider. Manage your diabetes. Eating a healthy diet and exercising regularly are important parts of  managing your blood sugar. If your blood sugar cannot be managed through diet and exercise, you may need to take medicines. Take any prescribed medicines to control your diabetes as told by your health care  provider. Control your hypertension. To reduce your risk of stroke, try to keep your blood pressure below 130/80. Eating a healthy diet and exercising regularly are an important part of controlling your blood pressure. If your blood pressure cannot be managed through diet and exercise, you may need to take medicines. Take any prescribed medicines to control hypertension as told by your health care provider. Ask your health care provider if you should monitor your blood pressure at home. Have your blood pressure checked every year, even if your blood pressure is normal. Blood pressure increases with age and some medical conditions. Get evaluated for sleep disorders (sleep apnea). Talk to your health care provider about getting a sleep evaluation if you snore a lot or have excessive sleepiness. Take over-the-counter and prescription medicines only as told by your health care provider. Aspirin or blood thinners (antiplatelets or anticoagulants) may be recommended to reduce your risk of forming blood clots that can lead to stroke. Make sure that any other medical conditions you have, such as atrial fibrillation or atherosclerosis, are managed. What are the warning signs of a stroke? The warning signs of a stroke can be easily remembered as BEFAST. B is for balance. Signs include: Dizziness. Loss of balance or coordination. Sudden trouble walking. E is for eyes. Signs include: A sudden change in vision. Trouble seeing. F is for face. Signs include: Sudden weakness or numbness of the face. The face or eyelid drooping to one side. A is for arms. Signs include: Sudden weakness or numbness of the arm, usually on one side of the body. S is for speech. Signs include: Trouble speaking (aphasia). Trouble understanding. T is for time. These symptoms may represent a serious problem that is an emergency. Do not wait to see if the symptoms will go away. Get medical help right away. Call your local  emergency services (911 in the [place] Do not drive yourself to the hospital. Other signs of stroke may include: A sudden, severe headache with no known cause. Nausea or vomiting. Seizure. Where to find more information For more information, visit: American Stroke Association: www.strokeassociation.org National Stroke Association: www.stroke.org Summary You can prevent a stroke by eating healthy, exercising, not smoking, limiting alcohol intake, and managing any medical conditions you may have. Do not use any products that contain nicotine or tobacco, such as cigarettes and e-cigarettes. If you need help quitting, ask your health care provider. It may also be helpful to avoid exposure to secondhand smoke. Remember BEFAST for warning signs of stroke. Get help right away if you or a loved one has any of these signs. This information is not intended to replace advice given to you by your health care provider. Make sure you discuss any questions you have with your health care provider. Document Revised: 04/13/2017 Document Reviewed: 06/06/2016 Elsevier Patient Education  2021 [employer].

## 2021-02-02 NOTE — Progress Notes (Signed)
[facility] [address] [place]. [place] [address] [phone]       OFFICE FOLLOW-UP NOTE  Xavier [name]. Date of Birth:  07-01-1965 Medical Record Number:  [id number]   HPI: [name] is a 55 year old Caucasian male seen today for office follow-up visit following recent hospital consultation for TIA.  History is obtained from the patient and his wife was accompanying him as well as review of electronic medical records and I personally reviewed pertinent available imaging films in PACS.Xavier [name]. is a 55 y.o. male with a pertinent history of DM, HLD, HTN, pulmonary embolism, DVT on xarelto, obesity, OSA on cpap, CAD s/p stents still on plavix, recent spinal surgery on 8/24 and has been on muscle relaxers and some narcotics, at home he developed some slurred speech a few days ago along with some jaw pain and locked jaw for a few hours.  Came to the ER where initial head CT was negative and he was admitted for further stroke/TIA work-up.  MRI scan of the brain was negative for acute infarct and was unremarkable.  MRA of the head and neck both did not show significant stenosis of intracranial extracranial vessels.  Patient was already on Plavix and Xarelto because of history of cardiac stents and DVT and pulmonary embolism.  2D echo showed left ventricular ejection fraction 60-65% with normal size left atrium.  Prolonged cardiac monitoring was not done since patient had an alternative reason for long-term anticoagulation and finding atrial fibrillation would not change management.  LDL cholesterol 47 mg percent and hemoglobin A1c was 6.6.  He was discharged home on his prior medication regimen of Plavix and Xarelto.  Patient states is done well since discharge.  Tolerating both medications well without bruising or bleeding.  His blood pressure is under good control today it is 128/81.  He remains on Crestor and Tricor which is tolerating well without side effects.  He has  had no recurrence of his jaw pain or locking of his jaw.  He has not yet made an appointment to see dentist for this.  He has no prior history of TIAs or stroke.  He has no new complaints.  ROS:   14 system review of systems is positive for speech difficulty, difficulty finding words, jaw pain, neck pain, involuntary spasm of the jaw, back pain and all other systems negative  PMH:  Past Medical History:  Diagnosis Date   ANEMIA, PERNICIOUS 04/03/2007   Anxiety and depression    Arthritis    "left hand" (06/26/2016)   Chronic lower back pain    grd I degen spondylolisthesis at L4-5 w/associated synovial cyst at facet at this level +underlying severe epidural lipomatosis L5-S1 up to L4-5--plan for surg w/Dr. Rolena Infante 12/2020.   Chronic renal insufficiency, stage 3 (moderate) ([place]) 2022   GFR around 60   Colon cancer screening 10/27/2016   Cologuard NEG-->rpt 3 yrs   Complication of anesthesia    slow to wake up   CORONARY ARTERY DISEASE 12/05/2006   DIABETES MELLITUS, TYPE II dx'd 2001   DVT (deep venous thrombosis) ([place]) 2016   BLE   GERD (gastroesophageal reflux disease)    History of balanitis    x 2. Holding off on circ as of 05/2020 urol f/u   History of blood transfusion 2001   "when I had my hand OR"   History of gout    History of kidney stones    HYPERCHOLESTEROLEMIA 07/17/2007   HYPERTENSION  12/05/2006   Learning disability    Migraine    "1-2/month" (06/26/2016)   Myocardial infarction ([place]) ~ 2003   Obesity, Class II, BMI 35-39.9    OSA on CPAP    Pt cannot recall setting clearly but thinks it is 5 cm h20.     PE (pulmonary embolism) 2016   Peripheral vascular disease ([place])    Simple renal cyst    X 2 on R kidney (one 5 cm and one 2 cm)   Stroke ([place]) ~ 2006   denies residual on 06/26/2016   TIA (transient ischemic attack) ?06/25/2016   2018 and 01/2021 (MRI brain, MR angio head and neck neg 2022)   VISUAL ACUITY, DECREASED, RIGHT EYE 02/19/2009    Social History:   Social History   Socioeconomic History   Marital status: Married    Spouse name: Lattie Haw   Number of children: Not on file   Years of education: Not on file   Highest education level: Not on file  Occupational History   Occupation: Disabled    Employer: DISABLED  Tobacco Use   Smoking status: Former    Packs/day: 0.10    Years: 3.00    Pack years: 0.30    Types: Cigarettes    Start date: 1985    Quit date: 05/15/1984    Years since quitting: 36.7   Smokeless tobacco: Never  Vaping Use   Vaping Use: Never used  Substance and Sexual Activity   Alcohol use: No   Drug use: No   Sexual activity: Not on file  Other Topics Concern   Not on file  Social History Narrative   Lives with wife and youngest son   Right Hand   Drinks no caffeine   Social Determinants of Health   Financial Resource Strain: Low Risk    Difficulty of Paying Living Expenses: Not hard at all  Food Insecurity: No Food Insecurity   Worried About Charity fundraiser in the Last Year: Never true   [place] in the Last Year: Never true  Transportation Needs: No Transportation Needs   Lack of Transportation (Medical): No   Lack of Transportation (Non-Medical): No  Physical Activity: Inactive   Days of Exercise per Week: 0 days   Minutes of Exercise per Session: 0 min  Stress: No Stress Concern Present   Feeling of Stress : Not at all  Social Connections: Moderately Integrated   Frequency of Communication with Friends and Family: More than three times a week   Frequency of Social Gatherings with Friends and Family: More than three times a week   Attends Religious Services: Never   Marine scientist or Organizations: Yes   Attends Music therapist: 1 to 4 times per year   Marital Status: Married  Human resources officer Violence: Not At Risk   Fear of Current or Ex-Partner: No   Emotionally Abused: No   Physically Abused: No   Sexually Abused: No    Medications:   Current Outpatient  Medications on File Prior to Visit  Medication Sig Dispense Refill   Blood Glucose Monitoring Suppl (ACCU-CHEK GUIDE ME) w/Device KIT USE TO CHECK BLOOD SUGAR 1 TIME PER DAY. DX CODE: E11.9 1 kit 0   bromocriptine (PARLODEL) 2.5 MG tablet Take 1.25 mg by mouth daily.     clopidogrel (PLAVIX) 75 MG tablet TAKE 1 TABLET BY MOUTH EVERY DAY 90 tablet 3   cyanocobalamin 1000 MCG tablet Take 1 tablet (1,000  mcg total) by mouth 2 (two) times daily. 60 tablet 0   Dulaglutide (TRULICITY) 4.5 NL/9.7QB SOPN Inject 4.5 mg as directed once a week. 6 mL 3   DULoxetine (CYMBALTA) 30 MG capsule Take 30 mg by mouth daily.     EPINEPHrine 0.3 mg/0.3 mL IJ SOAJ injection Inject 0.3 mLs (0.3 mg total) into the muscle as needed for anaphylaxis. 2 each 1   fenofibrate (TRICOR) 145 MG tablet TAKE 1 TABLET BY MOUTH EVERY DAY 90 tablet 1   FLUoxetine (PROZAC) 40 MG capsule Take 40 mg by mouth daily.     fluticasone (FLONASE) 50 MCG/ACT nasal spray USE ONE SPRAY IN EACH NOSTRIL TWICE A DAY 16 g 2   gabapentin (NEURONTIN) 300 MG capsule Take 300 mg by mouth 2 (two) times daily.     glucose blood (ACCU-CHEK GUIDE) test strip check your blood sugar once a day 100 each 12   hydrochlorothiazide (HYDRODIURIL) 12.5 MG tablet TAKE 1 TABLET BY MOUTH EVERY DAY WITH THE LOSARTAN 90 tablet 1   JARDIANCE 25 MG TABS tablet TAKE 1 TABLET BY MOUTH EVERY DAY 90 tablet 1   losartan (COZAAR) 100 MG tablet TAKE 1/2 TABLET BY MOUTH EVERY DAY 45 tablet 1   metFORMIN (GLUCOPHAGE-XR) 500 MG 24 hr tablet TAKE 2 TABLETS BY MOUTH TWICE A DAY 360 tablet 0   methocarbamol (ROBAXIN) 500 MG tablet Take 500 mg by mouth 2 (two) times daily.     nitroGLYCERIN (NITROSTAT) 0.4 MG SL tablet Place 1 tablet (0.4 mg total) under the tongue every 5 (five) minutes as needed for chest pain. 25 tablet 3   ondansetron (ZOFRAN) 4 MG tablet Take 1 tablet (4 mg total) by mouth every 8 (eight) hours as needed for nausea or vomiting. 20 tablet 0   rosuvastatin (CRESTOR)  10 MG tablet Take 1 tablet (10 mg total) by mouth daily. 30 tablet 0   XARELTO 20 MG TABS tablet TAKE 1 TABLET BY MOUTH EVERY DAY BEFORE SUPPER 30 tablet 5   No current facility-administered medications on file prior to visit.    Allergies:   Allergies  Allergen Reactions   Actos [Pioglitazone] Other (See Comments)    Potential cause of DVT   Bee Venom Anaphylaxis   Klonopin [Clonazepam] Anaphylaxis   Invokana [Canagliflozin] Nausea Only    Nausea/dizzy/bad taste in mouth   Niacin Other (See Comments)    "makes his crazy"    Physical Exam General: Obese middle-age Caucasian male, seated, in no evident distress Head: head normocephalic and atraumatic.  Neck: supple with no carotid or supraclavicular bruits Cardiovascular: regular rate and rhythm, no murmurs Musculoskeletal: no deformity wearing a back brace following recent surgery Skin:  no rash/petichiae Vascular:  Normal pulses all extremities Vitals:   02/02/21 1449  BP: 128/81  Pulse: 79   Neurologic Exam Mental Status: Awake and fully alert. Oriented to place and time. Recent and remote memory intact. Attention span, concentration and fund of knowledge appropriate. Mood and affect appropriate.  Cranial Nerves: Fundoscopic exam reveals sharp disc margins. Pupils equal, briskly reactive to light. Extraocular movements full without nystagmus. Visual fields full to confrontation. Hearing intact. Facial sensation intact. Face, tongue, palate moves normally and symmetrically.  Motor: Normal bulk and tone. Normal strength in all tested extremity muscles. Sensory.: intact to touch ,pinprick .position and vibratory sensation.  Coordination: Rapid alternating movements normal in all extremities. Finger-to-nose and heel-to-shin performed accurately bilaterally. Gait and Station: Arises from chair without difficulty. Stance is normal. Gait  demonstrates normal stride length and balance favors his back due to recent surgery and is  wearing back brace.  Not able to heel, toe and tandem walk without difficulty.  Reflexes: 1+ and symmetric. Toes downgoing.   NIHSS  0 Modified Rankin  1   ASSESSMENT: 55 year old Caucasian male with transient episode of expressive aphasia due to left hemispheric TIA and September 2022 as well as transient episode of right-sided jaw pain and spasm of unclear etiology which has resolved.  Vascular risk factors of diabetes, hypertension, hyperlipidemia and obesity     PLAN: I had a long d/w patient and his wife about his recent TIA and episode of jaw pain, risk for recurrent stroke/TIAs, personally independently reviewed imaging studies and stroke evaluation results and answered questions.Prolonged cardiac monitoring was not done since patient had an alternative reason to be on long-term anticoagulation.  Continue clopidogrel 75 mg daily and Xarelto (rivaroxaban) daily  for his TIA and history of DVT and pulmonary embolism for secondary stroke prevention and maintain strict control of hypertension with blood pressure goal below 130/90, diabetes with hemoglobin A1c goal below 6.5% and lipids with LDL cholesterol goal below 70 mg/dL. I also advised the patient to eat a healthy diet with plenty of whole grains, cereals, fruits and vegetables, exercise regularly and maintain ideal body weight his jaw pain has resolved and was of unclear etiology if it recurs I recommend he see her dentist.  Followup in the future with my nurse practitioner Janett Billow in 1 year or call earlier if necessary. Greater than 50% of time during this 25 minute visit was spent on counseling,explanation of diagnosis, planning of further management, discussion with patient and family and coordination of care Antony Contras, MD Note: This document was prepared with digital dictation and possible smart phrase technology. Any transcriptional errors that result from this process are unintentional

## 2021-02-09 ENCOUNTER — Other Ambulatory Visit: Payer: Self-pay

## 2021-02-09 ENCOUNTER — Ambulatory Visit (INDEPENDENT_AMBULATORY_CARE_PROVIDER_SITE_OTHER): Payer: Medicare Other | Admitting: Family Medicine

## 2021-02-09 ENCOUNTER — Encounter: Payer: Self-pay | Admitting: Family Medicine

## 2021-02-09 VITALS — BP 115/72 | HR 81 | Temp 97.5°F | Wt 240.4 lb

## 2021-02-09 DIAGNOSIS — E7800 Pure hypercholesterolemia, unspecified: Secondary | ICD-10-CM

## 2021-02-09 DIAGNOSIS — I10 Essential (primary) hypertension: Secondary | ICD-10-CM | POA: Diagnosis not present

## 2021-02-09 DIAGNOSIS — E876 Hypokalemia: Secondary | ICD-10-CM | POA: Diagnosis not present

## 2021-02-09 DIAGNOSIS — I2510 Atherosclerotic heart disease of native coronary artery without angina pectoris: Secondary | ICD-10-CM | POA: Diagnosis not present

## 2021-02-09 DIAGNOSIS — G459 Transient cerebral ischemic attack, unspecified: Secondary | ICD-10-CM

## 2021-02-09 DIAGNOSIS — Z7901 Long term (current) use of anticoagulants: Secondary | ICD-10-CM | POA: Diagnosis not present

## 2021-02-09 DIAGNOSIS — E538 Deficiency of other specified B group vitamins: Secondary | ICD-10-CM

## 2021-02-09 MED ORDER — EPINEPHRINE 0.3 MG/0.3ML IJ SOAJ: 0.3000 mg | INTRAMUSCULAR | 1 refills | Status: DC | PRN

## 2021-02-09 NOTE — Progress Notes (Signed)
02/09/2021  CC:  Chief Complaint  Patient presents with   Hospitalization Follow-up    Patient is a 55 y.o. Caucasian male who presents accompanied by his wife Lattie Haw for hospital follow up. Dates hospitalized: 9/13-9/15, 2022. Days since d/c from hospital: 13 days. Patient was discharged from hospital to home. Reason for admission to hospital: slurred speech and jaw pain/jaw locked + some cognitive slowing.   I have reviewed patient's discharge summary plus pertinent specific notes, labs, and imaging from the hospitalization.   Noncontrast CT on day of admission NEG ACUTE.  MRI w/out contrast of brain was normal.  MRA w and w/o  intracranial was NEG/NORMAL.  MRA neck w and w/out neg for flow-limiting stenosis, with equivocal stenosis of the non-dominant left V4 segment near the PICA origin. A 2D echo showed left ventricular ejection fraction 60-65% with normal size left atrium.  Prolonged cardiac monitoring was not done since patient had an alternative reason for long-term anticoagulation and finding atrial fibrillation would not change management.  Vitamin B12 level was low: 140.  He was started on vit b12 1000 mcg bid. Final dx L hemispheric TIA (? Vs narcotic and muscle relaxer med side effects?) as well as transient R sided jaw pain and spasm of unknown etiology. No med changes except his crestor was increased from 45m to 131mqd.  He was already on plavix for CAD w/ hx of coronary stent intervention and on xarelto for hx of DVT/PE.  CURRENTLY: Doing well, has had no further probs with slurred speech, jaw weakness, or cognitive impairment. No jaw pain or difficulty opening mouth/jaw. He had f/u for his TIA with Dr. SeLeonie Mann 02/02/21---no changes made, no new testing.  He did have lumbar decompression surgery by Dr. BrRolena Infante/24/22. Post op office f/u with him was fine.  Back is doing much better, starts PT next month.   Medication reconciliation was done today and patient is taking  meds as recommended by discharging hospitalist/specialist.    ROS as above, plus--> no fevers, no CP, no SOB, no wheezing, no cough, no dizziness, no HAs, no rashes, no melena/hematochezia.  No polyuria or polydipsia.  No myalgias or arthralgias.  No focal weakness, paresthesias, or tremors.  No acute vision or hearing abnormalities.  No dysuria or unusual/new urinary urgency or frequency.  No recent changes in lower legs. No n/v/d or abd pain.  No palpitations.    PMH:  Past Medical History:  Diagnosis Date   ANEMIA, PERNICIOUS 04/03/2007   Anxiety and depression    Arthritis    "left hand" (06/26/2016)   Chronic lower back pain    grd I degen spondylolisthesis at L4-5 w/associated synovial cyst at facet at this level +underlying severe epidural lipomatosis L5-S1 up to L4-5--plan for surg w/Dr. BrRolena Infante/2022.   Chronic renal insufficiency, stage 3 (moderate) ([id number]   GFR around 60   Colon cancer screening 10/27/2016   Cologuard NEG-->rpt 3 yrs   Complication of anesthesia    slow to wake up   CORONARY ARTERY DISEASE 12/05/2006   DIABETES MELLITUS, TYPE II dx'd 2001   DVT (deep venous thrombosis) ([id number]   BLE   GERD (gastroesophageal reflux disease)    History of balanitis    x 2. Holding off on circ as of 05/2020 urol f/u   History of blood transfusion 2001   "when I had my hand OR"   History of gout    History of kidney stones    HYPERCHOLESTEROLEMIA  07/17/2007   HYPERTENSION 12/05/2006   Learning disability    Migraine    "1-2/month" (06/26/2016)   Myocardial infarction [facility] ~ 2003   Obesity, Class II, BMI 35-39.9    OSA on CPAP    Pt cannot recall setting clearly but thinks it is 5 cm h20.     PE (pulmonary embolism) 2016   Peripheral vascular disease ([place])    Simple renal cyst    X 2 on R kidney (one 5 cm and one 2 cm)   Stroke ([place]) ~ 2006   denies residual on 06/26/2016   TIA (transient ischemic attack) ?06/25/2016   2018 and 01/2021 (MRI brain, MR angio head  and neck neg 2022)   VISUAL ACUITY, DECREASED, RIGHT EYE 02/19/2009    PSH:  Past Surgical History:  Procedure Laterality Date   CARDIAC CATHETERIZATION  1990s; 2012; 06/2016   06/2016 distal LAD DES stent.  EF 55-65%   CARDIOVASCULAR STRESS TEST  06/2016   Echo stress test->"inconclusive"-->cath was done after this   CATARACT EXTRACTION W/ INTRAOCULAR LENS IMPLANT Right 2017   CORONARY STENT INTERVENTION N/A 06/27/2016   Procedure: Coronary Stent Intervention;  Surgeon: Leonie Man, MD;  Location: [place] [facility];  Service: Cardiovascular;  Laterality: N/A;   EYE SURGERY Right    Cataract   INGUINAL HERNIA REPAIR Bilateral 1990s   INTRAVASCULAR PRESSURE WIRE/FFR STUDY N/A 06/27/2016   Procedure: Intravascular Pressure Wire/FFR Study;  Surgeon: Leonie Man, MD;  Location: [place] [facility];  Service: Cardiovascular;  Laterality: N/A;   LEFT HEART CATH AND CORONARY ANGIOGRAPHY N/A 06/27/2016   Procedure: Left Heart Cath and Coronary Angiography;  Surgeon: Leonie Man, MD;  Location: [place] [facility];  Service: Cardiovascular;  Laterality: N/A;   LUMBAR LAMINECTOMY/DECOMPRESSION MICRODISCECTOMY Left 01/05/2021   Procedure: Left L4-5 Gill Decompression;  Surgeon: Melina Schools, MD;  Location: [place];  Service: Orthopedics;  Laterality: Left;   REATTACHMENT HAND Left ~ 2001   w/carpal tunnel release   UMBILICAL HERNIA REPAIR  1990s   w/IHR   MEDS:  Outpatient Medications Prior to Visit  Medication Sig Dispense Refill   Blood Glucose Monitoring Suppl (ACCU-CHEK GUIDE ME) w/Device KIT USE TO CHECK BLOOD SUGAR 1 TIME PER DAY. DX CODE: E11.9 1 kit 0   bromocriptine (PARLODEL) 2.5 MG tablet Take 1.25 mg by mouth daily.     clopidogrel (PLAVIX) 75 MG tablet TAKE 1 TABLET BY MOUTH EVERY DAY 90 tablet 3   cyanocobalamin 1000 MCG tablet Take 1 tablet (1,000 mcg total) by mouth 2 (two) times daily. 60 tablet 0   Dulaglutide (TRULICITY) 4.5 PP/5.0DT SOPN Inject 4.5 mg as directed once  a week. 6 mL 3   DULoxetine (CYMBALTA) 30 MG capsule Take 30 mg by mouth daily.     EPINEPHrine 0.3 mg/0.3 mL IJ SOAJ injection Inject 0.3 mLs (0.3 mg total) into the muscle as needed for anaphylaxis. 2 each 1   fenofibrate (TRICOR) 145 MG tablet TAKE 1 TABLET BY MOUTH EVERY DAY 90 tablet 1   FLUoxetine (PROZAC) 40 MG capsule Take 40 mg by mouth daily.     fluticasone (FLONASE) 50 MCG/ACT nasal spray USE ONE SPRAY IN EACH NOSTRIL TWICE A DAY 16 g 2   gabapentin (NEURONTIN) 300 MG capsule Take 300 mg by mouth 2 (two) times daily.     glucose blood (ACCU-CHEK GUIDE) test strip check your blood sugar once a day 100 each 12   hydrochlorothiazide (HYDRODIURIL) 12.5 MG  tablet TAKE 1 TABLET BY MOUTH EVERY DAY WITH THE LOSARTAN 90 tablet 1   JARDIANCE 25 MG TABS tablet TAKE 1 TABLET BY MOUTH EVERY DAY 90 tablet 1   losartan (COZAAR) 100 MG tablet TAKE 1/2 TABLET BY MOUTH EVERY DAY 45 tablet 1   metFORMIN (GLUCOPHAGE-XR) 500 MG 24 hr tablet TAKE 2 TABLETS BY MOUTH TWICE A DAY 360 tablet 0   methocarbamol (ROBAXIN) 500 MG tablet Take 500 mg by mouth 2 (two) times daily.     nitroGLYCERIN (NITROSTAT) 0.4 MG SL tablet Place 1 tablet (0.4 mg total) under the tongue every 5 (five) minutes as needed for chest pain. 25 tablet 3   ondansetron (ZOFRAN) 4 MG tablet Take 1 tablet (4 mg total) by mouth every 8 (eight) hours as needed for nausea or vomiting. 20 tablet 0   rosuvastatin (CRESTOR) 10 MG tablet Take 1 tablet (10 mg total) by mouth daily. 30 tablet 0   XARELTO 20 MG TABS tablet TAKE 1 TABLET BY MOUTH EVERY DAY BEFORE SUPPER 30 tablet 5   No facility-administered medications prior to visit.   EXAM:  Vitals with BMI 02/09/2021 02/02/2021 01/27/2021  Height - 5' 11"  -  Weight 240 lbs 6 oz 244 lbs 6 oz -  BMI 56.97 94.8 -  Systolic 016 553 748  Diastolic 72 81 89  Pulse 81 79 75   Gen: Alert, well appearing.  Patient is oriented to person, place, time, and situation. AFFECT: pleasant, lucid thought and  speech. OLM:BEML: no injection, icteris, swelling, or exudate.  EOMI, PERRLA. Mouth: lips without lesion/swelling.  Oral mucosa pink and moist. Oropharynx without erythema, exudate, or swelling.  CV: RRR, no m/r/g.   LUNGS: CTA bilat, nonlabored resps, good aeration in all lung fields. EXT: no clubbing or cyanosis.  no edema.  Neuro: CN 2-12 intact bilaterally, strength 5/5 in proximal and distal upper extremities and lower extremities bilaterally.  No sensory deficits.  No tremor.  No disdiadochokinesis.  No ataxia.  Upper extremity and lower extremity DTRs symmetric.  No pronator drift.    Pertinent labs/imaging Lab Results  Component Value Date   TSH 2.73 10/10/2017   Lab Results  Component Value Date   WBC 7.5 01/26/2021   HGB 13.6 01/26/2021   HCT 39.5 01/26/2021   MCV 90.4 01/26/2021   PLT 465 (H) 01/26/2021   Lab Results  Component Value Date   CREATININE 1.06 01/26/2021   BUN 17 01/26/2021   NA 134 (L) 01/26/2021   K 3.4 (L) 01/26/2021   CL 101 01/26/2021   CO2 24 01/26/2021   Lab Results  Component Value Date   ALT 18 01/25/2021   AST 13 (L) 01/25/2021   ALKPHOS 35 (L) 01/25/2021   BILITOT 0.3 01/25/2021   Lab Results  Component Value Date   CHOL 132 01/26/2021   Lab Results  Component Value Date   HDL 53 01/26/2021   Lab Results  Component Value Date   LDLCALC 47 01/26/2021   Lab Results  Component Value Date   TRIG 159 (H) 01/26/2021   Lab Results  Component Value Date   CHOLHDL 2.5 01/26/2021   Lab Results  Component Value Date   PSA 0.18 01/20/2020   PSA 0.17 11/04/2018   PSA 0.26 10/10/2017   Lab Results  Component Value Date   HGBA1C 6.6 (H) 01/26/2021   Lab Results  Component Value Date   VITAMINB12 140 (L) 01/26/2021   ASSESSMENT/PLAN:  1) TIA: his sx's were  slurred speech, mild cognitive slowing, and jaw weakness. All imaging unremarkable. F/u with neuro has been done, next f/u with Dr. Leonie Man 1 yr. Cont antiplatelet  (plavix) bp control, dm control, cholesterol control, improve diet and exercise. Plan lipid and hepatic panel recheck 3 mo.  2) R sided jaw pain: this seemed to occur when jaw weakness resolved. Still a bit unclear whether this was jaw muscle spasm initially or weakness d/t TIA. At any rate, it has resolved.  3) Hypokalemia: K+was a little low in hosp and he got some oral replacement in hosp. Recheck bmet today.  4) Chronic anticoagulation for recurrent DVT, also PE: pt on lifelong xarelto 73m qd. Last hb in hosp 13.6.  5) Vit B12 deficiency: detected in hosp and started on 1000 mcg oral b12 bid. Plan recheck b12 level 3 mo.  6) DM: mgmt as per Dr. ELoanne Drilling  7) Chronic LBP: improved s/p lumbar decompression surgery in August. Has ortho f/u next month and starts PT next month as well. He is not requiring any pain meds at this time.  FOLLOW UP:  3 mo  Signed:  PCrissie Sickles MD           02/09/2021

## 2021-02-10 LAB — BASIC METABOLIC PANEL
BUN: 15 mg/dL (ref 6–23)
CO2: 27 mEq/L (ref 19–32)
Calcium: 10.3 mg/dL (ref 8.4–10.5)
Chloride: 98 mEq/L (ref 96–112)
Creatinine, Ser: 1.21 mg/dL (ref 0.40–1.50)
GFR: 67.82 mL/min (ref >60.00)
Glucose, Bld: 103 mg/dL — ABNORMAL HIGH (ref 70–99)
Potassium: 4 mEq/L (ref 3.5–5.1)
Sodium: 136 mEq/L (ref 135–145)

## 2021-02-16 DIAGNOSIS — M545 Low back pain, unspecified: Secondary | ICD-10-CM | POA: Diagnosis not present

## 2021-02-18 ENCOUNTER — Ambulatory Visit: Payer: Medicare Other | Admitting: Endocrinology

## 2021-02-20 ENCOUNTER — Other Ambulatory Visit: Payer: Self-pay | Admitting: Primary Care

## 2021-02-23 DIAGNOSIS — M545 Low back pain, unspecified: Secondary | ICD-10-CM | POA: Diagnosis not present

## 2021-02-24 ENCOUNTER — Other Ambulatory Visit: Payer: Self-pay | Admitting: Endocrinology

## 2021-03-01 ENCOUNTER — Other Ambulatory Visit: Payer: Self-pay

## 2021-03-01 ENCOUNTER — Ambulatory Visit (INDEPENDENT_AMBULATORY_CARE_PROVIDER_SITE_OTHER): Payer: Medicare Other | Admitting: Family

## 2021-03-01 ENCOUNTER — Encounter (HOSPITAL_BASED_OUTPATIENT_CLINIC_OR_DEPARTMENT_OTHER): Payer: Self-pay | Admitting: Family

## 2021-03-01 VITALS — BP 108/70 | HR 86 | Ht 71.0 in | Wt 247.0 lb

## 2021-03-01 DIAGNOSIS — E785 Hyperlipidemia, unspecified: Secondary | ICD-10-CM

## 2021-03-01 DIAGNOSIS — I25118 Atherosclerotic heart disease of native coronary artery with other forms of angina pectoris: Secondary | ICD-10-CM | POA: Diagnosis not present

## 2021-03-01 DIAGNOSIS — G4733 Obstructive sleep apnea (adult) (pediatric): Secondary | ICD-10-CM | POA: Diagnosis not present

## 2021-03-01 DIAGNOSIS — I10 Essential (primary) hypertension: Secondary | ICD-10-CM

## 2021-03-01 DIAGNOSIS — E669 Obesity, unspecified: Secondary | ICD-10-CM

## 2021-03-01 MED ORDER — ROSUVASTATIN CALCIUM 20 MG PO TABS
20.0000 mg | ORAL_TABLET | Freq: Every day | ORAL | 3 refills | Status: DC
Start: 2021-03-01 — End: 2022-02-03

## 2021-03-01 NOTE — Patient Instructions (Addendum)
Medication Instructions:  Your physician has recommended you make the following change in your medication:   CHANGE Rosuvastatin (Crestor) to 20 mg daily  *If you need a refill on your cardiac medications before your next appointment, please call your pharmacy*   Lab Work: None ordered today.  Testing/Procedures: None ordered today.    Follow-Up: At [facility], you and your health needs are our priority.  As part of our continuing mission to provide you with exceptional heart care, we have created designated Provider Care Teams.  These Care Teams include your primary Cardiologist (physician) and Advanced Practice Providers (APPs -  Physician Assistants and Nurse Practitioners) who all work together to provide you with the care you need, when you need it.  We recommend signing up for the patient portal called "MyChart".  Sign up information is provided on this After Visit Summary.  MyChart is used to connect with patients for Virtual Visits (Telemedicine).  Patients are able to view lab/test results, encounter notes, upcoming appointments, etc.  Non-urgent messages can be sent to your provider as well.   To learn more about what you can do with MyChart, go to [email or web address].    Your next appointment:   6 month(s)  The format for your next appointment:   In Person  Provider:   You may see [name], MD or one of the following Advanced Practice Providers on your designated Care Team:   Nada Boozer, NP Alver Sorrow, NP    Other Instructions   Heart Healthy Diet Recommendations: A low-salt diet is recommended. Meats should be grilled, baked, or boiled. Avoid fried foods. Focus on lean protein sources like fish or chicken with vegetables and fruits. The American Heart Association is a Chief Technology Officer!    Exercise recommendations: The American Heart Association recommends 150 minutes of moderate intensity exercise weekly. Try 30 minutes of moderate intensity  exercise 4-5 times per week. This could include walking, jogging, or swimming.

## 2021-03-01 NOTE — Progress Notes (Signed)
Office Visit    Patient Name: Xavier [name]. Date of Encounter: 03/01/2021  PCP:  Tammi Sou, MD   [place]  Cardiologist:  Jenkins Rouge, MD  Advanced Practice Provider:  No care team member to display Electrophysiologist:  None      Chief Complaint    Xavier [name]. is a 55 y.o. male with a hx of CAD s/p PCI/DES to LAD 06/2016, hypertension, hyperlipidemia, DM2, recurrent DVT/PE presents today for hospital follow up   Past Medical History    Past Medical History:  Diagnosis Date   ANEMIA, PERNICIOUS 04/03/2007   Anxiety and depression    Arthritis    "left hand" (06/26/2016)   Chronic lower back pain    grd I degen spondylolisthesis at L4-5 w/associated synovial cyst at facet at this level +underlying severe epidural lipomatosis L5-S1 up to L4-5--plan for surg w/Dr. Rolena Infante 12/2020.   Chronic renal insufficiency, stage 3 (moderate) ([place]) 2022   GFR around 60   Colon cancer screening 10/27/2016   Cologuard NEG-->rpt 3 yrs   Complication of anesthesia    slow to wake up   CORONARY ARTERY DISEASE 12/05/2006   DIABETES MELLITUS, TYPE II dx'd 2001   DVT (deep venous thrombosis) ([place]) 2016   BLE   GERD (gastroesophageal reflux disease)    History of balanitis    x 2. Holding off on circ as of 05/2020 urol f/u   History of blood transfusion 2001   "when I had my hand OR"   History of gout    History of kidney stones    HYPERCHOLESTEROLEMIA 07/17/2007   HYPERTENSION 12/05/2006   Learning disability    Migraine    "1-2/month" (06/26/2016)   Myocardial infarction ([place]) ~ 2003   Obesity, Class II, BMI 35-39.9    OSA on CPAP    Pt cannot recall setting clearly but thinks it is 5 cm h20.     PE (pulmonary embolism) 2016   Peripheral vascular disease ([place])    Simple renal cyst    X 2 on R kidney (one 5 cm and one 2 cm)   Stroke ([place]) ~ 2006   denies residual on 06/26/2016   TIA (transient ischemic attack) ?06/25/2016   2018 and 01/2021  (MRI brain, MR angio head and neck neg 2022)   VISUAL ACUITY, DECREASED, RIGHT EYE 02/19/2009   Past Surgical History:  Procedure Laterality Date   CARDIAC CATHETERIZATION  1990s; 2012; 06/2016   06/2016 distal LAD DES stent.  EF 55-65%   CARDIOVASCULAR STRESS TEST  06/2016   Echo stress test->"inconclusive"-->cath was done after this   CATARACT EXTRACTION W/ INTRAOCULAR LENS IMPLANT Right 2017   CORONARY STENT INTERVENTION N/A 06/27/2016   Procedure: Coronary Stent Intervention;  Surgeon: Leonie Man, MD;  Location: [facility];  Service: Cardiovascular;  Laterality: N/A;   EYE SURGERY Right    Cataract   INGUINAL HERNIA REPAIR Bilateral 1990s   INTRAVASCULAR PRESSURE WIRE/FFR STUDY N/A 06/27/2016   Procedure: Intravascular Pressure Wire/FFR Study;  Surgeon: Leonie Man, MD;  Location: [facility];  Service: Cardiovascular;  Laterality: N/A;   LEFT HEART CATH AND CORONARY ANGIOGRAPHY N/A 06/27/2016   Procedure: Left Heart Cath and Coronary Angiography;  Surgeon: Leonie Man, MD;  Location: [facility];  Service: Cardiovascular;  Laterality: N/A;   LUMBAR LAMINECTOMY/DECOMPRESSION MICRODISCECTOMY Left 01/05/2021   Procedure: Left L4-5 Gill Decompression;  Surgeon: Melina Schools, MD;  Location: [facility];  Service: Orthopedics;  Laterality: Left;   REATTACHMENT HAND Left ~ 2001   w/carpal tunnel release   UMBILICAL HERNIA REPAIR  1990s   w/IHR    Allergies  Allergies  Allergen Reactions   Actos [Pioglitazone] Other (See Comments)    Potential cause of DVT   Bee Venom Anaphylaxis   Klonopin [Clonazepam] Anaphylaxis   Invokana [Canagliflozin] Nausea Only    Nausea/dizzy/bad taste in mouth   Niacin Other (See Comments)    "makes his crazy"    History of Present Illness    Xavier [name]. is a 55 y.o. 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  Last seen 08/19/20 b yDr.  Johnsie Cancel.  He was last seen 08/19/2020 by Dr. Johnsie Cancel.  He was very active and doing fishing and [place] shooting.  He had bilateral lower extremity pain and subsequent ABIs without evidence of PAD.  He was recommended for follow-up in 6 months.  He had lumbar decompression surgery by Dr. Rolena Infante 01/05/2021. Admitted 01/25/21-01/27/21 with TIA with expressive aphasia and one episode of lockjaw. TIA vs side effect of narcotic after recent spine surgery. Echo stable. Crestor doubled as LDL above goal.   He presents today for follow-up with his wife.  He is working with physical therapy to gain strength after back surgery. Reports no shortness of breath nor dyspnea on exertion. Reports no chest pain, pressure, or tightness. No edema, orthopnea, PND. Reports no palpitations.     EKGs/Labs/Other Studies Reviewed:   The following studies were reviewed today:  Echo 01/26/21 1. Left ventricular ejection fraction, by estimation, is 60 to 65%. The  left ventricle has normal function. The left ventricle has no regional  wall motion abnormalities. There is moderate left ventricular hypertrophy.  Left ventricular diastolic  parameters are consistent with Grade I diastolic dysfunction (impaired  relaxation).   2. Right ventricular systolic function is normal. The right ventricular  size is normal. There is normal pulmonary artery systolic pressure.   3. The mitral valve is normal in structure. No evidence of mitral valve  regurgitation. No evidence of mitral stenosis.   4. The aortic valve is tricuspid. Aortic valve regurgitation is not  visualized. No aortic stenosis is present.   5. The inferior vena cava is normal in size with greater than 50%  respiratory variability, suggesting right atrial pressure of 3 mmHg.   LHC 06/2017  The left ventricular systolic function is normal. The left ventricular ejection fraction is 55-65% by visual estimate. LV end diastolic pressure is normal. ________Pertinent Coronary  Anatomy Findings__________ Dist LAD-1 lesion, 70 %stenosed. FFR 0.77 (Physiologically Significant) A STENT SYNERGY DES 2.25X12 drug eluting stent was successfully placed (post-dilated to 2.5 mm) Post intervention, there is a 0% residual stenosis.     Successful FFR guided PCI of distal LAD focal 70-75% lesion with DES stent.   Otherwise no sniffing CAD noted.   Plan: Return to nursing unit for ongoing care and TR band removal. Would continue aspirin plus Brilinta for minimum of 3 months. After that would be okay to stop aspirin if necessary. Continue risk factor modification by Cardiology Consultation Team He Would Be Okay Discharge from a Cardiac Standpoint/Post PCI on February 14.  EKG:  None ordered today.   Recent Labs: 01/25/2021: ALT 18 01/26/2021: Hemoglobin 13.6; Platelets 465 02/09/2021: BUN 15; Creatinine, Ser 1.21; Potassium 4.0; Sodium 136  Recent Lipid Panel  Component Value Date/Time   CHOL 132 01/26/2021 0639   TRIG 159 (H) 01/26/2021 0639   HDL 53 01/26/2021 0639   CHOLHDL 2.5 01/26/2021 0639   VLDL 32 01/26/2021 0639   LDLCALC 47 01/26/2021 0639   LDLDIRECT 82.0 11/04/2018 0936    Home Medications   Current Meds  Medication Sig   Blood Glucose Monitoring Suppl (ACCU-CHEK GUIDE ME) w/Device KIT USE TO CHECK BLOOD SUGAR 1 TIME PER DAY. DX CODE: E11.9   bromocriptine (PARLODEL) 2.5 MG tablet Take 1.25 mg by mouth daily.   clopidogrel (PLAVIX) 75 MG tablet TAKE 1 TABLET BY MOUTH EVERY DAY   cyanocobalamin 1000 MCG tablet Take 1 tablet (1,000 mcg total) by mouth 2 (two) times daily.   diazepam (VALIUM) 10 MG tablet in the morning and at bedtime.   Dulaglutide (TRULICITY) 4.5 VH/8.4ON SOPN Inject 4.5 mg as directed once a week.   DULoxetine (CYMBALTA) 30 MG capsule Take 30 mg by mouth daily.   EPINEPHrine 0.3 mg/0.3 mL IJ SOAJ injection Inject 0.3 mg into the muscle as needed for anaphylaxis.   fenofibrate (TRICOR) 145 MG tablet TAKE 1 TABLET BY MOUTH EVERY DAY    FLUoxetine (PROZAC) 40 MG capsule Take 40 mg by mouth daily.   fluticasone (FLONASE) 50 MCG/ACT nasal spray USE ONE SPRAY IN EACH NOSTRIL TWICE A DAY   gabapentin (NEURONTIN) 300 MG capsule Take 300 mg by mouth 2 (two) times daily.   glucose blood (ACCU-CHEK GUIDE) test strip check your blood sugar once a day   hydrochlorothiazide (HYDRODIURIL) 12.5 MG tablet TAKE 1 TABLET BY MOUTH EVERY DAY WITH THE LOSARTAN   JARDIANCE 25 MG TABS tablet TAKE 1 TABLET BY MOUTH EVERY DAY   losartan (COZAAR) 100 MG tablet TAKE 1/2 TABLET BY MOUTH EVERY DAY   metFORMIN (GLUCOPHAGE-XR) 500 MG 24 hr tablet TAKE 2 TABLETS BY MOUTH TWICE A DAY   methocarbamol (ROBAXIN) 500 MG tablet Take 500 mg by mouth 2 (two) times daily.   nitroGLYCERIN (NITROSTAT) 0.4 MG SL tablet Place 1 tablet (0.4 mg total) under the tongue every 5 (five) minutes as needed for chest pain.   ondansetron (ZOFRAN) 4 MG tablet Take 1 tablet (4 mg total) by mouth every 8 (eight) hours as needed for nausea or vomiting.   rosuvastatin (CRESTOR) 10 MG tablet Take 1 tablet (10 mg total) by mouth daily.   XARELTO 20 MG TABS tablet TAKE 1 TABLET BY MOUTH EVERY DAY BEFORE SUPPER    Review of Systems      All other systems reviewed and are otherwise negative except as noted above.  Physical Exam    VS:  BP 108/70 Comment: left arm  Pulse 86   Ht 5' 11"  (1.803 m)   Wt 247 lb (112 kg)   SpO2 98%   BMI 34.45 kg/m  , BMI Body mass index is 34.45 kg/m.  Wt Readings from Last 3 Encounters:  03/01/21 247 lb (112 kg)  02/09/21 240 lb 6.4 oz (109 kg)  02/02/21 244 lb 6.4 oz (110.9 kg)     GEN: Well nourished, well developed, in no acute distress. HEENT: normal. Neck: Supple, no JVD, carotid bruits, or masses. Cardiac: RRR, no murmurs, rubs, or gallops. No clubbing, cyanosis, edema.  Radials/PT 2+ and equal bilaterally.  Respiratory:  Respirations regular and unlabored, clear to auscultation bilaterally. GI: Soft, nontender, nondistended. MS:  No deformity or atrophy. Skin: Warm and dry, no rash.Splotchy brown 4" x 8" area to the mid back above  site of surgery/back brace. Neuro:  Strength and sensation are intact. Psych: Normal affect.  Assessment & Plan    CAD - Stable with no anginal symptoms. No indication for ischemic evaluation.  GDMT includes clopidogrel, rosuvastatin. Heart healthy diet and regular cardiovascular exercise encouraged.    HLD, LDL goal <70 -01/26/2021 total cholesterol 132, HDL 53, LDL 47, triglycerides 159.  Given history of coronary disease, obesity, possible TIA I do think he would benefit from high intensity statin therapy to minimize cardiovascular risk.  As such we will increase his rosuvastatin to 20 mg daily.  History DVT/ PE -continue Xarelto 20 mg daily.  Dose appropriate.  Denies bleeding complications.  DM2 - Follows with Dr. Loanne Drilling of endocrinology.  Appreciate inclusion of Jardiance for cardioprotective benefit.  Pernicious anemia with vitamin B12 deficiency -follows with primary care.  Obesity - Weight loss via diet and exercise encouraged. Discussed the impact being overweight would have on cardiovascular risk.   OSA - CPAP compliance encouraged.   TIA -recent admission with possible TIA versus side effects from narcotics after back surgery.  Follows with neurology.  Continue Plavix, statin.  Continue optimal BP, diabetes, cholesterol control.  Skin discoloration - Initial ED visit 09/13/20 with rash.  Skin discoloration over mid back that is splotchy brown.  Nontender on palpation.  No itching.  It is not at the site of his surgery nor back brace and was present prior to surgery, low suspicion contributory.  He does not sleep on his back and reports no injury.  Recommend follow-up with primary care or dermatology.  Disposition: Follow up in 6 month(s) with Dr. Johnsie Cancel or APP.  Signed, Loel Dubonnet, NP 03/01/2021, 9:42 AM [place]

## 2021-03-02 DIAGNOSIS — M545 Low back pain, unspecified: Secondary | ICD-10-CM | POA: Diagnosis not present

## 2021-03-15 ENCOUNTER — Ambulatory Visit: Payer: Medicare Other | Admitting: Physician Assistant

## 2021-03-15 ENCOUNTER — Ambulatory Visit (INDEPENDENT_AMBULATORY_CARE_PROVIDER_SITE_OTHER): Payer: Medicare Other | Admitting: Endocrinology

## 2021-03-15 ENCOUNTER — Other Ambulatory Visit: Payer: Self-pay

## 2021-03-15 VITALS — BP 120/70 | HR 78 | Ht 71.0 in | Wt 253.0 lb

## 2021-03-15 DIAGNOSIS — E538 Deficiency of other specified B group vitamins: Secondary | ICD-10-CM

## 2021-03-15 DIAGNOSIS — I2510 Atherosclerotic heart disease of native coronary artery without angina pectoris: Secondary | ICD-10-CM | POA: Diagnosis not present

## 2021-03-15 DIAGNOSIS — E7800 Pure hypercholesterolemia, unspecified: Secondary | ICD-10-CM

## 2021-03-15 DIAGNOSIS — E119 Type 2 diabetes mellitus without complications: Secondary | ICD-10-CM | POA: Diagnosis not present

## 2021-03-15 LAB — T4, FREE: Free T4: 0.99 ng/dL (ref 0.60–1.60)

## 2021-03-15 LAB — TSH: TSH: 1.51 u[IU]/mL (ref 0.35–5.50)

## 2021-03-15 LAB — POCT GLYCOSYLATED HEMOGLOBIN (HGB A1C): Hemoglobin A1C: 6.7 % — AB (ref 4.0–5.6)

## 2021-03-15 LAB — VITAMIN B12: Vitamin B-12: 261 pg/mL (ref 211–911)

## 2021-03-15 MED ORDER — CYANOCOBALAMIN 1000 MCG PO TABS: 1000.0000 ug | ORAL_TABLET | Freq: Every day | ORAL | 3 refills | Status: DC

## 2021-03-15 NOTE — Progress Notes (Signed)
Subjective:    Patient ID: Xavier [name]., male    DOB: November 14, 1965, 55 y.o.   MRN: [id number]  HPI Pt returns for f/u of diabetes mellitus:  DM type: 2 Dx'ed: 3383 Complications: CAD, TIA, CRI, and PN.   Therapy: Ozempic and 3 oral meds.   DKA: never Severe hypoglycemia: never.   Pancreatitis: never.  Other: he did not tolerate pioglitizone (edema); he has never taken insulin, except in the hospital.   Interval history: no cbg record, but states cbg's are well-controlled.  pt states he feels well in general.  He takes meds as rx'ed.   Past Medical History:  Diagnosis Date   ANEMIA, PERNICIOUS 04/03/2007   Anxiety and depression    Arthritis    "left hand" (06/26/2016)   Chronic lower back pain    grd I degen spondylolisthesis at L4-5 w/associated synovial cyst at facet at this level +underlying severe epidural lipomatosis L5-S1 up to L4-5--plan for surg w/Dr. Rolena Infante 12/2020.   Chronic renal insufficiency, stage 3 (moderate) ([place]) 2022   GFR around 60   Colon cancer screening 10/27/2016   Cologuard NEG-->rpt 3 yrs   Complication of anesthesia    slow to wake up   CORONARY ARTERY DISEASE 12/05/2006   DIABETES MELLITUS, TYPE II dx'd 2001   DVT (deep venous thrombosis) ([place]) 2016   BLE   GERD (gastroesophageal reflux disease)    History of balanitis    x 2. Holding off on circ as of 05/2020 urol f/u   History of blood transfusion 2001   "when I had my hand OR"   History of gout    History of kidney stones    HYPERCHOLESTEROLEMIA 07/17/2007   HYPERTENSION 12/05/2006   Learning disability    Migraine    "1-2/month" (06/26/2016)   Myocardial infarction ([place]) ~ 2003   Obesity, Class II, BMI 35-39.9    OSA on CPAP    Pt cannot recall setting clearly but thinks it is 5 cm h20.     PE (pulmonary embolism) 2016   Peripheral vascular disease ([place])    Simple renal cyst    X 2 on R kidney (one 5 cm and one 2 cm)   Stroke ([place]) ~ 2006   denies residual on 06/26/2016   TIA  (transient ischemic attack) ?06/25/2016   2018 and 01/2021 (MRI brain, MR angio head and neck neg 2022)   VISUAL ACUITY, DECREASED, RIGHT EYE 02/19/2009    Past Surgical History:  Procedure Laterality Date   CARDIAC CATHETERIZATION  1990s; 2012; 06/2016   06/2016 distal LAD DES stent.  EF 55-65%   CARDIOVASCULAR STRESS TEST  06/2016   Echo stress test->"inconclusive"-->cath was done after this   CATARACT EXTRACTION W/ INTRAOCULAR LENS IMPLANT Right 2017   CORONARY STENT INTERVENTION N/A 06/27/2016   Procedure: Coronary Stent Intervention;  Surgeon: Leonie Man, MD;  Location: [facility];  Service: Cardiovascular;  Laterality: N/A;   EYE SURGERY Right    Cataract   INGUINAL HERNIA REPAIR Bilateral 1990s   INTRAVASCULAR PRESSURE WIRE/FFR STUDY N/A 06/27/2016   Procedure: Intravascular Pressure Wire/FFR Study;  Surgeon: Leonie Man, MD;  Location: [facility];  Service: Cardiovascular;  Laterality: N/A;   LEFT HEART CATH AND CORONARY ANGIOGRAPHY N/A 06/27/2016   Procedure: Left Heart Cath and Coronary Angiography;  Surgeon: Leonie Man, MD;  Location: [facility];  Service: Cardiovascular;  Laterality: N/A;   LUMBAR LAMINECTOMY/DECOMPRESSION MICRODISCECTOMY Left 01/05/2021  Procedure: Left L4-5 Gill Decompression;  Surgeon: Melina Schools, MD;  Location: [facility];  Service: Orthopedics;  Laterality: Left;   REATTACHMENT HAND Left ~ 2001   w/carpal tunnel release   UMBILICAL HERNIA REPAIR  1990s   w/IHR    Social History   Socioeconomic History   Marital status: Married    Spouse name: Lattie Haw   Number of children: Not on file   Years of education: Not on file   Highest education level: Not on file  Occupational History   Occupation: Disabled    Employer: DISABLED  Tobacco Use   Smoking status: Former    Packs/day: 0.10    Years: 3.00    Pack years: 0.30    Types: Cigarettes    Start date: 1985    Quit date: 05/15/1984    Years since quitting: 36.8    Smokeless tobacco: Never  Vaping Use   Vaping Use: Never used  Substance and Sexual Activity   Alcohol use: No   Drug use: No   Sexual activity: Not on file  Other Topics Concern   Not on file  Social History Narrative   Lives with wife and youngest son   Right Hand   Drinks no caffeine   Social Determinants of Health   Financial Resource Strain: Low Risk    Difficulty of Paying Living Expenses: Not hard at all  Food Insecurity: No Food Insecurity   Worried About Charity fundraiser in the Last Year: Never true   [place] in the Last Year: Never true  Transportation Needs: No Transportation Needs   Lack of Transportation (Medical): No   Lack of Transportation (Non-Medical): No  Physical Activity: Inactive   Days of Exercise per Week: 0 days   Minutes of Exercise per Session: 0 min  Stress: No Stress Concern Present   Feeling of Stress : Not at all  Social Connections: Moderately Integrated   Frequency of Communication with Friends and Family: More than three times a week   Frequency of Social Gatherings with Friends and Family: More than three times a week   Attends Religious Services: Never   Marine scientist or Organizations: Yes   Attends Music therapist: 1 to 4 times per year   Marital Status: Married  Human resources officer Violence: Not At Risk   Fear of Current or Ex-Partner: No   Emotionally Abused: No   Physically Abused: No   Sexually Abused: No    Current Outpatient Medications on File Prior to Visit  Medication Sig Dispense Refill   Blood Glucose Monitoring Suppl (ACCU-CHEK GUIDE ME) w/Device KIT USE TO CHECK BLOOD SUGAR 1 TIME PER DAY. DX CODE: E11.9 1 kit 0   bromocriptine (PARLODEL) 2.5 MG tablet Take 1.25 mg by mouth daily.     clopidogrel (PLAVIX) 75 MG tablet TAKE 1 TABLET BY MOUTH EVERY DAY 90 tablet 3   diazepam (VALIUM) 10 MG tablet in the morning and at bedtime.     Dulaglutide (TRULICITY) 4.5 EV/0.3JK SOPN Inject 4.5 mg as  directed once a week. 6 mL 3   DULoxetine (CYMBALTA) 30 MG capsule Take 30 mg by mouth daily.     EPINEPHrine 0.3 mg/0.3 mL IJ SOAJ injection Inject 0.3 mg into the muscle as needed for anaphylaxis. 2 each 1   fenofibrate (TRICOR) 145 MG tablet TAKE 1 TABLET BY MOUTH EVERY DAY 90 tablet 1   FLUoxetine (PROZAC) 40 MG capsule Take 40 mg by mouth  daily.     fluticasone (FLONASE) 50 MCG/ACT nasal spray USE ONE SPRAY IN EACH NOSTRIL TWICE A DAY 16 g 2   gabapentin (NEURONTIN) 300 MG capsule Take 300 mg by mouth 2 (two) times daily.     glucose blood (ACCU-CHEK GUIDE) test strip check your blood sugar once a day 100 each 12   hydrochlorothiazide (HYDRODIURIL) 12.5 MG tablet TAKE 1 TABLET BY MOUTH EVERY DAY WITH THE LOSARTAN 90 tablet 1   JARDIANCE 25 MG TABS tablet TAKE 1 TABLET BY MOUTH EVERY DAY 90 tablet 1   losartan (COZAAR) 100 MG tablet TAKE 1/2 TABLET BY MOUTH EVERY DAY 45 tablet 1   metFORMIN (GLUCOPHAGE-XR) 500 MG 24 hr tablet TAKE 2 TABLETS BY MOUTH TWICE A DAY 360 tablet 0   methocarbamol (ROBAXIN) 500 MG tablet Take 500 mg by mouth 2 (two) times daily.     nitroGLYCERIN (NITROSTAT) 0.4 MG SL tablet Place 1 tablet (0.4 mg total) under the tongue every 5 (five) minutes as needed for chest pain. 25 tablet 3   ondansetron (ZOFRAN) 4 MG tablet Take 1 tablet (4 mg total) by mouth every 8 (eight) hours as needed for nausea or vomiting. 20 tablet 0   rosuvastatin (CRESTOR) 20 MG tablet Take 1 tablet (20 mg total) by mouth daily. 90 tablet 3   XARELTO 20 MG TABS tablet TAKE 1 TABLET BY MOUTH EVERY DAY BEFORE SUPPER 30 tablet 5   No current facility-administered medications on file prior to visit.    Allergies  Allergen Reactions   Actos [Pioglitazone] Other (See Comments)    Potential cause of DVT   Bee Venom Anaphylaxis   Klonopin [Clonazepam] Anaphylaxis   Invokana [Canagliflozin] Nausea Only    Nausea/dizzy/bad taste in mouth   Niacin Other (See Comments)    "makes his crazy"     Family History  Problem Relation Age of Onset   COPD Mother    Lymphoma Father     BP 120/70 (BP Location: Right Arm, Patient Position: Sitting, Cuff Size: Large)   Pulse 78   Ht _0  (1.803 m)   Wt 253 lb (114.8 kg)   SpO2 96%   BMI 35.29 kg/m    Review of Systems He has gained weight    Objective:   Physical Exam Pulses: dorsalis pedis intact bilat.   MSK: no deformity of the feet CV: trace bilat leg edema.   Skin:  no ulcer on the feet.  normal color and temp on the feet.   Neuro: sensation is intact to touch on the feet.     Lab Results  Component Value Date   HGBA1C 6.7 (A) 03/15/2021   Lab Results  Component Value Date   CREATININE 1.21 02/09/2021   BUN 15 02/09/2021   NA 136 02/09/2021   K 4.0 02/09/2021   CL 98 02/09/2021   CO2 27 02/09/2021      Assessment & Plan:  Type 2 DM: well-controlled Weight gain: check TFT  Patient Instructions  Please continue the same diabetes medications.   Blood tests are requested for you today.  We'll let you know about the results.   check your blood sugar once a day.  vary the time of day when you check, between before the 3 meals, and at bedtime.  also check if you have symptoms of your blood sugar being too high or too low.  please keep a record of the readings and bring it to your next appointment here (or you  can bring the meter itself).  You can write it on any piece of paper.  please call us sooner if your blood sugar goes below 70, or if you have a lot of readings over 200.   Please come back for a follow-up appointment in 6 months.

## 2021-03-15 NOTE — Patient Instructions (Addendum)
Please continue the same diabetes medications.   Blood tests are requested for you today.  We'll let you know about the results.   check your blood sugar once a day.  vary the time of day when you check, between before the 3 meals, and at bedtime.  also check if you have symptoms of your blood sugar being too high or too low.  please keep a record of the readings and bring it to your next appointment here (or you can bring the meter itself).  You can write it on any piece of paper.  please call us sooner if your blood sugar goes below 70, or if you have a lot of readings over 200.   Please come back for a follow-up appointment in 6 months.

## 2021-03-28 ENCOUNTER — Other Ambulatory Visit: Payer: Self-pay | Admitting: Endocrinology

## 2021-03-29 ENCOUNTER — Other Ambulatory Visit: Payer: Self-pay

## 2021-03-29 ENCOUNTER — Ambulatory Visit (HOSPITAL_COMMUNITY)
Admission: RE | Admit: 2021-03-29 | Discharge: 2021-03-29 | Disposition: A | Discharge status: Home / Self Care | Payer: Medicare Other | Source: Ambulatory Visit | Attending: Cardiovascular Disease | Admitting: Cardiovascular Disease

## 2021-03-29 ENCOUNTER — Other Ambulatory Visit (HOSPITAL_COMMUNITY): Payer: Self-pay | Admitting: Orthopedic Surgery

## 2021-03-29 DIAGNOSIS — M79604 Pain in right leg: Secondary | ICD-10-CM

## 2021-03-29 DIAGNOSIS — M7989 Other specified soft tissue disorders: Secondary | ICD-10-CM | POA: Insufficient documentation

## 2021-03-29 DIAGNOSIS — M79605 Pain in left leg: Secondary | ICD-10-CM | POA: Diagnosis not present

## 2021-04-13 ENCOUNTER — Encounter: Payer: Medicare Other | Admitting: Family Medicine

## 2021-04-13 NOTE — Progress Notes (Deleted)
Office Note 04/13/2021  CC: No chief complaint on file.   HPI:  Patient is a 55 y.o. male who is here for f/u A/P as of last visit 2 months ago: "1) TIA: his sx's were slurred speech, mild cognitive slowing, and jaw weakness. All imaging unremarkable. F/u with neuro has been done, next f/u with Dr. Leonie Man 1 yr. Cont antiplatelet (plavix) bp control, dm control, cholesterol control, improve diet and exercise. Plan lipid and hepatic panel recheck 3 mo.   2) R sided jaw pain: this seemed to occur when jaw weakness resolved. Still a bit unclear whether this was jaw muscle spasm initially or weakness d/t TIA. At any rate, it has resolved.   3) Hypokalemia: K+was a little low in hosp and he got some oral replacement in hosp. Recheck bmet today.   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 Last hb in hosp 13.6.   5) Vit B12 deficiency: detected in hosp and started on 1000 mcg oral b12 bid. Plan recheck b12 level 3 mo.   6) DM: mgmt as per Dr. ELoanne Drilling   7) Chronic LBP: improved s/p lumbar decompression surgery in August. Has ortho f/u next month and starts PT next month as well. He is not requiring any pain meds at this time."  INTERIM HX: ***    Past Medical History:  Diagnosis Date   ANEMIA, PERNICIOUS 04/03/2007   Anxiety and depression    Arthritis    "left hand" (06/26/2016)   Chronic lower back pain    grd I degen spondylolisthesis at L4-5 w/associated synovial cyst at facet at this level +underlying severe epidural lipomatosis L5-S1 up to L4-5--plan for surg w/Dr. BRolena Infante8/2022.   Chronic renal insufficiency, stage 3 (moderate) ([facility] [place]   GFR around 60   Colon cancer screening 10/27/2016   Cologuard NEG-->rpt 3 yrs   Complication of anesthesia    slow to wake up   CORONARY ARTERY DISEASE 12/05/2006   DIABETES MELLITUS, TYPE II dx'd 2001   [place]) [place] [place]   BLE   GERD (gastroesophageal  reflux disease)    History of balanitis    x 2. Holding off on circ as of 05/2020 urol f/u   History of blood transfusion 2001   "when I had my hand OR"   History of gout    History of kidney stones    HYPERCHOLESTEROLEMIA 07/17/2007   HYPERTENSION 12/05/2006   Learning disability    Migraine    "1-2/month" (06/26/2016)   Myocardial infarction ([facility] ~ 2003   Obesity, Class II, BMI 35-39.9    OSA on CPAP    Pt cannot recall setting clearly but thinks it is 5 cm h20.     PE (pulmonary embolism) 2016   [place]ipheral vascular disease ([facility]    Simple renal cyst    X 2 on R kidney (one 5 cm and one 2 cm)   [place] [place]   denies residual on 06/26/2016   TIA (transient ischemic attack) ?06/25/2016   2018 and 01/2021 (MRI brain, MR angio head and neck neg 2022)   VISUAL ACUITY, DECREASED, RIGHT EYE 02/19/2009    Past Surgical History:  Procedure Laterality Date   CARDIAC CATHETERIZATION  1990s; 2012; 06/2016   06/2016 distal LAD DES stent.  EF 55-65%   CARDIOVASCULAR STRESS TEST  06/2016   Echo stress test->"inconclusive"-->cath was done after this   CATARACT EXTRACTION W/ INTRAOCULAR LENS IMPLANT Right  2017   CORONARY STENT INTERVENTION N/A 06/27/2016   Procedure: Coronary Stent Intervention;  Surgeon: Leonie Man, MD;  Location: [facility];  Service: Cardiovascular;  Laterality: N/A;   EYE SURGERY Right    Cataract   INGUINAL HERNIA REPAIR Bilateral 1990s   INTRAVASCULAR PRESSURE WIRE/FFR STUDY N/A 06/27/2016   Procedure: Intravascular Pressure Wire/FFR Study;  Surgeon: Leonie Man, MD;  Location: [facility];  Service: Cardiovascular;  Laterality: N/A;   LEFT HEART CATH AND CORONARY ANGIOGRAPHY N/A 06/27/2016   Procedure: Left Heart Cath and Coronary Angiography;  Surgeon: Leonie Man, MD;  Location: [facility];  Service: Cardiovascular;  Laterality: N/A;   LUMBAR LAMINECTOMY/DECOMPRESSION MICRODISCECTOMY Left 01/05/2021   Procedure: Left L4-5 Gill  Decompression;  Surgeon: Melina Schools, MD;  Location: [facility];  Service: Orthopedics;  Laterality: Left;   REATTACHMENT HAND Left ~ 2001   w/carpal tunnel release   UMBILICAL HERNIA REPAIR  1990s   w/[facility]    Family History  Problem Relation Age of Onset   COPD Mother    Lymphoma Father     Social History   Socioeconomic History   Marital status: Married    Spouse name: Lattie Haw   Number of children: Not on file   Years of education: Not on file   Highest education level: Not on file  Occupational History   Occupation: Disabled    Employer: DISABLED  Tobacco Use   Smoking status: Former    Packs/day: 0.10    Years: 3.00    Pack years: 0.30    Types: Cigarettes    Start date: 1985    Quit date: 05/15/1984    Years since quitting: 36.9   Smokeless tobacco: Never  Vaping Use   Vaping Use: Never used  Substance and Sexual Activity   Alcohol use: No   Drug use: No   Sexual activity: Not on file  Other Topics Concern   Not on file  Social History Narrative   Lives with wife and youngest son   Right Hand   Drinks no caffeine   Social Determinants of Health   Financial Resource Strain: Low Risk    Difficulty of Paying Living Expenses: Not hard at all  Food Insecurity: No Food Insecurity   Worried About Charity fundraiser in the Last Year: Never true   [place] in the Last Year: Never true  Transportation Needs: No Transportation Needs   Lack of Transportation (Medical): No   Lack of Transportation (Non-Medical): No  Physical Activity: Inactive   Days of Exercise per Week: 0 days   Minutes of Exercise per Session: 0 min  Stress: No Stress Concern Present   Feeling of Stress : Not at all  Social Connections: Moderately Integrated   Frequency of Communication with Friends and Family: More than three times a week   Frequency of Social Gatherings with Friends and Family: More than three times a week   Attends Religious Services: Never   Marine scientist or  Organizations: Yes   Attends Music therapist: 1 to 4 times per year   Marital Status: Married  Human resources officer Violence: Not At Risk   Fear of Current or Ex-Partner: No   Emotionally Abused: No   Physically Abused: No   Sexually Abused: No    Outpatient Medications Prior to Visit  Medication Sig Dispense Refill   Blood Glucose Monitoring Suppl (ACCU-CHEK GUIDE ME) w/Device KIT USE  TO CHECK BLOOD SUGAR 1 TIME PER DAY. DX CODE: E11.9 1 kit 0   bromocriptine (PARLODEL) 2.5 MG tablet Take 1.25 mg by mouth daily.     clopidogrel (PLAVIX) 75 MG tablet TAKE 1 TABLET BY MOUTH EVERY DAY 90 tablet 3   cyanocobalamin 1000 MCG tablet Take 1 tablet (1,000 mcg total) by mouth daily. 90 tablet 3   diazepam (VALIUM) 10 MG tablet in the morning and at bedtime.     Dulaglutide (TRULICITY) 4.5 GU/5.4YH SOPN Inject 4.5 mg as directed once a week. 6 mL 3   DULoxetine (CYMBALTA) 30 MG capsule Take 30 mg by mouth daily.     EPINEPHrine 0.3 mg/0.3 mL IJ SOAJ injection Inject 0.3 mg into the muscle as needed for anaphylaxis. 2 each 1   fenofibrate (TRICOR) 145 MG tablet TAKE 1 TABLET BY MOUTH EVERY DAY 90 tablet 1   FLUoxetine (PROZAC) 40 MG capsule Take 40 mg by mouth daily.     fluticasone (FLONASE) 50 MCG/ACT nasal spray USE ONE SPRAY IN EACH NOSTRIL TWICE A DAY 16 g 2   gabapentin (NEURONTIN) 300 MG capsule Take 300 mg by mouth 2 (two) times daily.     glucose blood (ACCU-CHEK GUIDE) test strip check your blood sugar once a day 100 each 12   hydrochlorothiazide (HYDRODIURIL) 12.5 MG tablet TAKE 1 TABLET BY MOUTH EVERY DAY WITH THE LOSARTAN 90 tablet 1   JARDIANCE 25 MG TABS tablet TAKE 1 TABLET BY MOUTH EVERY DAY 90 tablet 1   losartan (COZAAR) 100 MG tablet TAKE 1/2 TABLET BY MOUTH EVERY DAY 45 tablet 1   metFORMIN (GLUCOPHAGE-XR) 500 MG 24 hr tablet TAKE 2 TABLETS BY MOUTH TWICE A DAY 360 tablet 0   methocarbamol (ROBAXIN) 500 MG tablet Take 500 mg by mouth 2 (two) times daily.      nitroGLYCERIN (NITROSTAT) 0.4 MG SL tablet Place 1 tablet (0.4 mg total) under the tongue every 5 (five) minutes as needed for chest pain. 25 tablet 3   ondansetron (ZOFRAN) 4 MG tablet Take 1 tablet (4 mg total) by mouth every 8 (eight) hours as needed for nausea or vomiting. 20 tablet 0   rosuvastatin (CRESTOR) 20 MG tablet Take 1 tablet (20 mg total) by mouth daily. 90 tablet 3   XARELTO 20 MG TABS tablet TAKE 1 TABLET BY MOUTH EVERY DAY BEFORE SUPPER 30 tablet 5   No facility-administered medications prior to visit.    Allergies  Allergen Reactions   Actos [Pioglitazone] Other (See Comments)    Potential cause of DVT   Bee Venom Anaphylaxis   Klonopin [Clonazepam] Anaphylaxis   Invokana [Canagliflozin] Nausea Only    Nausea/dizzy/bad taste in mouth   Niacin Other (See Comments)    "makes his crazy"    ROS *** PE; Vitals with BMI 03/15/2021 03/01/2021 03/01/2021  Height 5' 11" - 5' 11"  Weight 253 lbs - 247 lbs  BMI 06.2 - 37.62  Systolic 831 517 616  Diastolic 70 70 80  Pulse 78 - 86     *** Pertinent labs:  Lab Results  Component Value Date   TSH 1.51 03/15/2021   Lab Results  Component Value Date   WBC 7.5 01/26/2021   HGB 13.6 01/26/2021   HCT 39.5 01/26/2021   MCV 90.4 01/26/2021   PLT 465 (H) 01/26/2021   Lab Results  Component Value Date   CREATININE 1.21 02/09/2021   BUN 15 02/09/2021   NA 136 02/09/2021   K 4.0  02/09/2021   CL 98 02/09/2021   CO2 27 02/09/2021   Lab Results  Component Value Date   ALT 18 01/25/2021   AST 13 (L) 01/25/2021   ALKPHOS 35 (L) 01/25/2021   BILITOT 0.3 01/25/2021   Lab Results  Component Value Date   CHOL 132 01/26/2021   Lab Results  Component Value Date   HDL 53 01/26/2021   Lab Results  Component Value Date   LDLCALC 47 01/26/2021   Lab Results  Component Value Date   TRIG 159 (H) 01/26/2021   Lab Results  Component Value Date   CHOLHDL 2.5 01/26/2021   Lab Results  Component Value Date    PSA 0.18 01/20/2020   PSA 0.17 11/04/2018   PSA 0.26 10/10/2017   Lab Results  Component Value Date   HGBA1C 6.7 (A) 03/15/2021   ASSESSMENT AND PLAN:   No problem-specific Assessment & Plan notes found for this encounter.  Health maintenance exam: Reviewed age and gender appropriate health maintenance issues (prudent diet, regular exercise, health risks of tobacco and excessive alcohol, use of seatbelts, fire alarms in home, use of sunscreen).  Also reviewed age and gender appropriate health screening as well as vaccine recommendations. Vaccines: Flu->***.  Prevnar 20->***.  Shingrix #2-->***. Labs: cbc, cmet, flp, psa Prostate ca screening: PSA Colon ca screening: is due for initial screening->***.  An After Visit Summary was printed and given to the patient.  FOLLOW UP:  No follow-ups on file.  Signed:  Crissie Sickles, MD           04/13/2021

## 2021-04-14 ENCOUNTER — Telehealth: Payer: Self-pay

## 2021-04-14 NOTE — Telephone Encounter (Signed)
[facility] Day - Client Nonclinical Telephone Record  [facility] Client [facility] Day - Client Client Site [facility] - Day Provider Santiago Bumpers - MD Contact Type Call Who Is Calling Patient / Member / Family / Caregiver Caller Name Elim Economou Caller Phone Number [phone] Patient Name Xavier [name] Patient DOB 1966-05-14 Call Type Message Only Information Provided Reason for Call Request to Reschedule Office Appointment Initial Comment Caller states he needs to cancel appt for 8am . Disp. Time Disposition Final User 04/13/2021 7:14:48 AM General Information Provided Yes Pearletha Furl Call Closed By: Pearletha Furl Transaction Date/Time: 04/13/2021 7:10:09 AM (ET

## 2021-04-15 ENCOUNTER — Other Ambulatory Visit: Payer: Self-pay | Admitting: Family Medicine

## 2021-04-28 ENCOUNTER — Other Ambulatory Visit: Payer: Self-pay

## 2021-04-29 ENCOUNTER — Encounter: Payer: Self-pay | Admitting: Family Medicine

## 2021-04-29 ENCOUNTER — Ambulatory Visit (INDEPENDENT_AMBULATORY_CARE_PROVIDER_SITE_OTHER): Payer: Medicare Other | Admitting: Family Medicine

## 2021-04-29 VITALS — BP 105/74 | HR 72 | Temp 97.7°F | Ht 71.0 in | Wt 244.6 lb

## 2021-04-29 DIAGNOSIS — Z8673 Personal history of transient ischemic attack (TIA), and cerebral infarction without residual deficits: Secondary | ICD-10-CM | POA: Diagnosis not present

## 2021-04-29 DIAGNOSIS — E538 Deficiency of other specified B group vitamins: Secondary | ICD-10-CM

## 2021-04-29 DIAGNOSIS — I10 Essential (primary) hypertension: Secondary | ICD-10-CM

## 2021-04-29 DIAGNOSIS — I2510 Atherosclerotic heart disease of native coronary artery without angina pectoris: Secondary | ICD-10-CM

## 2021-04-29 DIAGNOSIS — Z23 Encounter for immunization: Secondary | ICD-10-CM

## 2021-04-29 DIAGNOSIS — Z0000 Encounter for general adult medical examination without abnormal findings: Secondary | ICD-10-CM | POA: Diagnosis not present

## 2021-04-29 DIAGNOSIS — Z7901 Long term (current) use of anticoagulants: Secondary | ICD-10-CM | POA: Diagnosis not present

## 2021-04-29 DIAGNOSIS — Z1211 Encounter for screening for malignant neoplasm of colon: Secondary | ICD-10-CM | POA: Diagnosis not present

## 2021-04-29 DIAGNOSIS — E7800 Pure hypercholesterolemia, unspecified: Secondary | ICD-10-CM

## 2021-04-29 DIAGNOSIS — N182 Chronic kidney disease, stage 2 (mild): Secondary | ICD-10-CM

## 2021-04-29 DIAGNOSIS — Z125 Encounter for screening for malignant neoplasm of prostate: Secondary | ICD-10-CM | POA: Diagnosis not present

## 2021-04-29 LAB — COMPREHENSIVE METABOLIC PANEL
ALT: 33 U/L (ref 0–53)
AST: 22 U/L (ref 0–37)
Albumin: 4.4 g/dL (ref 3.5–5.2)
Alkaline Phosphatase: 48 U/L (ref 39–117)
BUN: 15 mg/dL (ref 6–23)
CO2: 27 mEq/L (ref 19–32)
Calcium: 10.4 mg/dL (ref 8.4–10.5)
Chloride: 101 mEq/L (ref 96–112)
Creatinine, Ser: 1.24 mg/dL (ref 0.40–1.50)
GFR: 65.76 mL/min (ref >60.00)
Glucose, Bld: 128 mg/dL — ABNORMAL HIGH (ref 70–99)
Potassium: 4.4 mEq/L (ref 3.5–5.1)
Sodium: 139 mEq/L (ref 135–145)
Total Bilirubin: 0.6 mg/dL (ref 0.2–1.2)
Total Protein: 7.6 g/dL (ref 6.0–8.3)

## 2021-04-29 LAB — LIPID PANEL
Cholesterol: 131 mg/dL (ref 0–200)
HDL: 58.6 mg/dL (ref >39.00)
NonHDL: 72.71
Total CHOL/HDL Ratio: 2
Triglycerides: 202 mg/dL — ABNORMAL HIGH (ref 0.0–149.0)
VLDL: 40.4 mg/dL — ABNORMAL HIGH (ref 0.0–40.0)

## 2021-04-29 LAB — PSA, MEDICARE: PSA: 0.35 ng/ml (ref 0.10–4.00)

## 2021-04-29 LAB — VITAMIN B12: Vitamin B-12: 446 pg/mL (ref 211–911)

## 2021-04-29 LAB — LDL CHOLESTEROL, DIRECT: Direct LDL: 58 mg/dL

## 2021-04-29 MED ORDER — ZOSTER VAC RECOMB ADJUVANTED 50 MCG/0.5ML IM SUSR: 0.5000 mL | Freq: Once | INTRAMUSCULAR | 0 refills | Status: AC

## 2021-04-29 MED ORDER — FENOFIBRATE 145 MG PO TABS: 145.0000 mg | ORAL_TABLET | Freq: Every day | ORAL | 3 refills | Status: DC

## 2021-04-29 MED ORDER — RIVAROXABAN 20 MG PO TABS: ORAL_TABLET | ORAL | 3 refills | Status: DC

## 2021-04-29 MED ORDER — HYDROCHLOROTHIAZIDE 12.5 MG PO TABS: ORAL_TABLET | ORAL | 3 refills | Status: DC

## 2021-04-29 MED ORDER — FLUTICASONE PROPIONATE 50 MCG/ACT NA SUSP: NASAL | 3 refills | Status: DC

## 2021-04-29 NOTE — Addendum Note (Signed)
Addended by: Emi Holes D on: 04/29/2021 10:46 AM   Modules accepted: Orders

## 2021-04-29 NOTE — Progress Notes (Signed)
Office Note 04/29/2021  CC:  Chief Complaint  Patient presents with   Annual Exam    HPI:  Patient is a 55 y.o. male who is here accompanied by his wife for annual health maintenance exam and f/u CRI II/III, hx of TIA, and vit B12 deficiency. 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  His DM is managed by Dr. Loanne Drilling.  Depression and anxiety managed by psychiatrist (Dr. Toy Care).  I last saw him 02/09/21 for hospital f/u. A/P as of that visit: "1) TIA: his sx's were slurred speech, mild cognitive slowing, and jaw weakness. All imaging unremarkable. F/u with neuro has been done, next f/u with Dr. Leonie Man 1 yr. Cont antiplatelet (plavix) bp control, dm control, cholesterol control, improve diet and exercise. Plan lipid and hepatic panel recheck 3 mo.   2) R sided jaw pain: this seemed to occur when jaw weakness resolved. Still a bit unclear whether this was jaw muscle spasm initially or weakness d/t TIA. At any rate, it has resolved.   3) Hypokalemia: K+was a little low in hosp and he got some oral replacement in hosp. Recheck bmet today.   4) Chronic anticoagulation for recurrent DVT, also PE: pt on lifelong xarelto 86m qd. Last hb in hosp 13.6.   5) Vit B12 deficiency: detected in hosp and started on 1000 mcg oral b12 bid. Plan recheck b12 level 3 mo.   6) DM: mgmt as per Dr. ELoanne Drilling   7) Chronic LBP: improved s/p lumbar decompression surgery in August. Has ortho f/u next month and starts PT next month as well. He is not requiring any pain meds at this time."  INTERIM HX: BShannansays he feels well. He is taking his B12 as well as all his other chronic medications as directed. Having some low back issues still in his getting followed by his back doctor.  Was recently put on some prednisone for this.  Past Medical History:  Diagnosis Date   ANEMIA, PERNICIOUS 04/03/2007   Anxiety and depression    Arthritis    "left hand" (06/26/2016)   Chronic lower back pain     grd I degen spondylolisthesis at L4-5 w/associated synovial cyst at facet at this level +underlying severe epidural lipomatosis L5-S1 up to L4-5--plan for surg w/Dr. BRolena Infante8/2022.   [place]ronic renal [place], stage 3 (moderate) [place]   GFR around 60   Colon cancer screening 10/27/2016   Cologuard NEG-->rpt 3 yrs   Complication of anesthesia    slow to wake up   CORONARY ARTERY DISEASE 12/05/2006   DIABETES MELLITUS, TYPE II dx'd 2001   DVT [place]deep [place]) [place] [place]   BLE   GERD (gastroesophageal reflux disease)    History of balanitis    x 2. Holding off on circ as of 05/2020 urol f/u   History of blood transfusion 2001   "when I had my hand OR"   History of gout    History of kidney stones    HYPERCHOLESTEROLEMIA 07/17/2007   HYPERTENSION 12/05/2006   Learning disability    Migraine    "1-2/month" (06/26/2016)   Myocardial infarction ([facility] [place] 2003   Obesity, Class II, BMI 35-39.9    OSA on CPAP    Pt cannot recall setting clearly but thinks it is 5 cm h20.     PE (pulmonary embolism) 2016   Peripheral vascular disease (HCC)    Simple renal cyst    X 2 on R kidney (one 5 cm  and one 2 cm)   Stroke [facility]) ~ 2006   denies residual on 06/26/2016   TIA (transient ischemic attack) ?06/25/2016   2018 and 01/2021 (MRI brain, MR angio head and neck neg 2022)   VISUAL ACUITY, DECREASED, RIGHT EYE 02/19/2009    Past Surgical History:  Procedure Laterality Date   CARDIAC CATHETERIZATION  1990s; 2012; 06/2016   06/2016 distal LAD DES stent.  EF 55-65%   CARDIOVASCULAR STRESS TEST  06/2016   Echo stress test->"inconclusive"-->cath was done after this   CATARACT EXTRACTION W/ INTRAOCULAR LENS IMPLANT Right 2017   CORONARY STENT INTERVENTION N/A 06/27/2016   Procedure: Coronary Stent Intervention;  Surgeon: Leonie Man, MD;  Location: [place] [facility];  Service: Cardiovascular;  Laterality: N/A;   EYE SURGERY Right    Cataract   INGUINAL HERNIA REPAIR Bilateral 1990s    INTRAVASCULAR PRESSURE WIRE/FFR STUDY N/A 06/27/2016   Procedure: Intravascular Pressure Wire/FFR Study;  Surgeon: Leonie Man, MD;  Location: [place] [facility];  Service: Cardiovascular;  Laterality: N/A;   LEFT HEART CATH AND CORONARY ANGIOGRAPHY N/A 06/27/2016   Procedure: Left Heart Cath and Coronary Angiography;  Surgeon: Leonie Man, MD;  Location: [place] [facility];  Service: Cardiovascular;  Laterality: N/A;   LUMBAR LAMINECTOMY/DECOMPRESSION MICRODISCECTOMY Left 01/05/2021   Procedure: Left L4-5 Gill Decompression;  Surgeon: Melina Schools, MD;  Location: [place];  Service: Orthopedics;  Laterality: Left;   REATTACHMENT HAND Left ~ 2001   w/carpal tunnel release   UMBILICAL HERNIA REPAIR  1990s   w/IHR    Family History  Problem Relation Age of Onset   COPD Mother    Lymphoma Father     Social History   Socioeconomic History   Marital status: Married    Spouse name: Lattie Haw   Number of children: Not on file   Years of education: Not on file   Highest education level: Not on file  Occupational History   Occupation: Disabled    Employer: DISABLED  Tobacco Use   Smoking status: Former    Packs/day: 0.10    Years: 3.00    Pack years: 0.30    Types: Cigarettes    Start date: 1985    Quit date: 05/15/1984    Years since quitting: 36.9   Smokeless tobacco: Never  Vaping Use   Vaping Use: Never used  Substance and Sexual Activity   Alcohol use: No   Drug use: No   Sexual activity: Not on file  Other Topics Concern   Not on file  Social History Narrative   Lives with wife and youngest son   Right Hand   Drinks no caffeine   Social Determinants of Health   Financial Resource Strain: Not on file  Food Insecurity: Not on file  Transportation Needs: Not on file  Physical Activity: Not on file  Stress: Not on file  Social Connections: Not on file  Intimate Partner Violence: Not on file    Outpatient Medications Prior to Visit  Medication Sig Dispense  Refill   Blood Glucose Monitoring Suppl (ACCU-CHEK GUIDE ME) w/Device KIT USE TO CHECK BLOOD SUGAR 1 TIME PER DAY. DX CODE: E11.9 1 kit 0   bromocriptine (PARLODEL) 2.5 MG tablet Take 1.25 mg by mouth daily.     clopidogrel (PLAVIX) 75 MG tablet TAKE 1 TABLET BY MOUTH EVERY DAY 90 tablet 3   cyanocobalamin 1000 MCG tablet Take 1 tablet (1,000 mcg total) by mouth daily. 90 tablet 3  diazepam (VALIUM) 10 MG tablet in the morning and at bedtime.     Dulaglutide (TRULICITY) 4.5 EN/2.7PO SOPN Inject 4.5 mg as directed once a week. 6 mL 3   DULoxetine (CYMBALTA) 30 MG capsule Take 30 mg by mouth daily.     FLUoxetine (PROZAC) 40 MG capsule Take 40 mg by mouth daily.     gabapentin (NEURONTIN) 300 MG capsule Take 300 mg by mouth 2 (two) times daily.     glucose blood (ACCU-CHEK GUIDE) test strip check your blood sugar once a day 100 each 12   JARDIANCE 25 MG TABS tablet TAKE 1 TABLET BY MOUTH EVERY DAY 90 tablet 1   losartan (COZAAR) 100 MG tablet TAKE 1/2 TABLET BY MOUTH EVERY DAY 45 tablet 0   metFORMIN (GLUCOPHAGE-XR) 500 MG 24 hr tablet TAKE 2 TABLETS BY MOUTH TWICE A DAY 360 tablet 0   methocarbamol (ROBAXIN) 500 MG tablet Take 500 mg by mouth 2 (two) times daily.     rosuvastatin (CRESTOR) 20 MG tablet Take 1 tablet (20 mg total) by mouth daily. 90 tablet 3   fenofibrate (TRICOR) 145 MG tablet TAKE 1 TABLET BY MOUTH EVERY DAY 90 tablet 1   fluticasone (FLONASE) 50 MCG/ACT nasal spray USE ONE SPRAY IN EACH NOSTRIL TWICE A DAY 16 g 2   hydrochlorothiazide (HYDRODIURIL) 12.5 MG tablet TAKE 1 TABLET BY MOUTH EVERY DAY WITH THE LOSARTAN 90 tablet 1   XARELTO 20 MG TABS tablet TAKE 1 TABLET BY MOUTH EVERY DAY BEFORE SUPPER 30 tablet 5   EPINEPHrine 0.3 mg/0.3 mL IJ SOAJ injection Inject 0.3 mg into the muscle as needed for anaphylaxis. (Patient not taking: Reported on 04/29/2021) 2 each 1   nitroGLYCERIN (NITROSTAT) 0.4 MG SL tablet Place 1 tablet (0.4 mg total) under the tongue every 5 (five) minutes  as needed for chest pain. (Patient not taking: Reported on 04/29/2021) 25 tablet 3   ondansetron (ZOFRAN) 4 MG tablet Take 1 tablet (4 mg total) by mouth every 8 (eight) hours as needed for nausea or vomiting. (Patient not taking: Reported on 04/29/2021) 20 tablet 0   No facility-administered medications prior to visit.    Allergies  Allergen Reactions   Actos [Pioglitazone] Other (See Comments)    Potential cause of DVT   Bee Venom Anaphylaxis   Klonopin [Clonazepam] Anaphylaxis   Invokana [Canagliflozin] Nausea Only    Nausea/dizzy/bad taste in mouth   Niacin Other (See Comments)    "makes his crazy"    ROS Review of Systems  Constitutional:  Negative for appetite change, chills, fatigue and fever.  HENT:  Negative for congestion, dental problem, ear pain and sore throat.   Eyes:  Negative for discharge, redness and visual disturbance.  Respiratory:  Negative for cough, chest tightness, shortness of breath and wheezing.   Cardiovascular:  Negative for chest pain, palpitations and leg swelling.  Gastrointestinal:  Negative for abdominal pain, blood in stool, diarrhea, nausea and vomiting.  Genitourinary:  Negative for difficulty urinating, dysuria, flank pain, frequency, hematuria and urgency.  Musculoskeletal:  Negative for arthralgias, back pain, joint swelling, myalgias and neck stiffness.  Skin:  Negative for pallor and rash.  Neurological:  Negative for dizziness, speech difficulty, weakness and headaches.  Hematological:  Negative for adenopathy. Does not bruise/bleed easily.  Psychiatric/Behavioral:  Negative for confusion and sleep disturbance. The patient is not nervous/anxious.    PE; Vitals with BMI 04/29/2021 03/15/2021 03/01/2021  Height _0  _1  -  Weight 244 lbs 10  oz 253 lbs -  BMI 33.54 56.2 -  Systolic 563 893 734  Diastolic 74 70 70  Pulse 72 78 -     Gen: Alert, well appearing.  Patient is oriented to person, place, time, and situation. AFFECT:  pleasant, lucid thought and speech. ENT: Ears: EACs clear, normal epithelium.  TMs with good light reflex and landmarks bilaterally.  Eyes: no injection, icteris, swelling, or exudate.  EOMI, PERRLA. Nose: no drainage or turbinate edema/swelling.  No injection or focal lesion.  Mouth: lips without lesion/swelling.  Oral mucosa pink and moist.  Dentition intact and without obvious caries or gingival swelling.  Oropharynx without erythema, exudate, or swelling.  Neck: supple/nontender.  No LAD, mass, or TM.  Carotid pulses 2+ bilaterally, without bruits. CV: RRR, no m/r/g.   LUNGS: CTA bilat, nonlabored resps, good aeration in all lung fields. ABD: soft, NT, ND, BS normal.  No hepatospenomegaly or mass.  No bruits. EXT: no clubbing, cyanosis, or edema.  Musculoskeletal: no joint swelling, erythema, warmth, or tenderness.  ROM of all joints intact. Skin - no sores or suspicious lesions or rashes or color changes  Pertinent labs:  Lab Results  Component Value Date   TSH 1.51 03/15/2021   Lab Results  Component Value Date   WBC 7.5 01/26/2021   HGB 13.6 01/26/2021   HCT 39.5 01/26/2021   MCV 90.4 01/26/2021   PLT 465 (H) 01/26/2021   Lab Results  Component Value Date   VITAMINB12 261 03/15/2021   Lab Results  Component Value Date   CREATININE 1.21 02/09/2021   BUN 15 02/09/2021   NA 136 02/09/2021   K 4.0 02/09/2021   CL 98 02/09/2021   CO2 27 02/09/2021   Lab Results  Component Value Date   ALT 18 01/25/2021   AST 13 (L) 01/25/2021   ALKPHOS 35 (L) 01/25/2021   BILITOT 0.3 01/25/2021   Lab Results  Component Value Date   CHOL 132 01/26/2021   Lab Results  Component Value Date   HDL 53 01/26/2021   Lab Results  Component Value Date   LDLCALC 47 01/26/2021   Lab Results  Component Value Date   TRIG 159 (H) 01/26/2021   Lab Results  Component Value Date   CHOLHDL 2.5 01/26/2021   Lab Results  Component Value Date   PSA 0.18 01/20/2020   PSA 0.17 11/04/2018    PSA 0.26 10/10/2017   Lab Results  Component Value Date   HGBA1C 6.7 (A) 03/15/2021   ASSESSMENT AND PLAN:   #1 hypertension, well controlled.  Continue one half of a losartan 100 mg tab and HCTZ 12.5 mg a day. Electrolytes and creatinine today.  2.  Vitamin B12 deficiency.  He has been on oral vitamin B12 supplement for the last 3 months or so.  Checking B12 level today.  3.  History of TIA.  No currents of symptoms.  He is on Plavix and statin. Lipids and hepatic panel checked today.  Keep neuro follow-up  4.  Chronic renal insufficiency stage II/III: He avoids NSAIDs. Electrolytes and creatinine monitor today  #5 history of DVT and PE.  Long-term anticoagulation with Xarelto 20 mg a day.  No signs of bleeding.  Last hemoglobin 3 months ago normal.  No changes.  6 health maintenance exam: Reviewed age and gender appropriate health maintenance issues (prudent diet, regular exercise, health risks of tobacco and excessive alcohol, use of seatbelts, fire alarms in home, use of sunscreen).  Also reviewed age  and gender appropriate health screening as well as vaccine recommendations. Vaccines: Prevnar 20->given today.  Shingrix #2->given today.  Flu->declined. Labs: cbc, cmet, flp, vit b12, PSA. Prostate ca screening: PSA today. Colon ca screening: due for repeat cologuard->ordered.  An After Visit Summary was printed and given to the patient.  FOLLOW UP:  Return in about 6 months (around 10/28/2021).  Signed:  Crissie Sickles, MD           04/29/2021

## 2021-04-29 NOTE — Patient Instructions (Signed)

## 2021-05-02 ENCOUNTER — Encounter: Payer: Self-pay | Admitting: Family Medicine

## 2021-05-02 NOTE — Telephone Encounter (Signed)
He is on plavix and xarelto.  Sometimes an area of irritation in the esophagus or trachea/bronchi can bleed pretty easily on these meds. OK to wait and have him come in to see me next week but if it continues this week then I recommend he stop his xarelto and try to see another provider within [place] sooner.

## 2021-05-04 ENCOUNTER — Ambulatory Visit: Payer: Medicare Other

## 2021-05-04 NOTE — Patient Instructions (Signed)

## 2021-05-04 NOTE — Progress Notes (Signed)
Subjective:   Xavier [name]. is a 55 y.o. male who presents for Medicare Annual/Subsequent preventive examination.  Review of Systems    I connected with  Kaizen Ibsen. on 05/05/21 by an audio only telemedicine application and verified that I am speaking with the correct person using two identifiers.   I discussed the limitations, risks, security and privacy concerns of performing an evaluation and management service by telephone and the availability of in person appointments. I also discussed with the patient that there may be a patient responsible charge related to this service. The patient expressed understanding and verbally consented to this telephonic visit.  Location of Patient: home  Location of Provider: office  List any persons and their role that are participating in the visit with the patient.   [place] Juhi Lagrange     Objective:    There were no vitals filed for this visit. There is no height or weight on file to calculate BMI.  Advanced Directives 05/05/2021 01/26/2021 01/25/2021 01/24/2021 01/05/2021 12/23/2020 09/15/2020  Does Patient Have a Medical Advance Directive? Yes _0  No  Would patient like information on creating a medical advance directive? - No - Patient declined - - No - Patient declined Yes (MAU/Ambulatory/Procedural Areas - Information given) No - Patient declined  Pre-existing out of facility DNR order (yellow form or pink MOST form) - - - - - - -    Current Medications (verified) Outpatient Encounter Medications as of 05/05/2021  Medication Sig   Blood Glucose Monitoring Suppl (ACCU-CHEK GUIDE ME) w/Device KIT USE TO CHECK BLOOD SUGAR 1 TIME PER DAY. DX CODE: E11.9   bromocriptine (PARLODEL) 2.5 MG tablet Take 1.25 mg by mouth daily.   clopidogrel (PLAVIX) 75 MG tablet TAKE 1 TABLET BY MOUTH EVERY DAY   cyanocobalamin 1000 MCG tablet Take 1 tablet (1,000 mcg total) by mouth daily.   diazepam (VALIUM) 10 MG tablet in the morning  and at bedtime.   Dulaglutide (TRULICITY) 4.5 OV/5.6EP SOPN Inject 4.5 mg as directed once a week.   DULoxetine (CYMBALTA) 30 MG capsule Take 30 mg by mouth daily.   EPINEPHrine 0.3 mg/0.3 mL IJ SOAJ injection Inject 0.3 mg into the muscle as needed for anaphylaxis. (Patient not taking: Reported on 04/29/2021)   fenofibrate (TRICOR) 145 MG tablet Take 1 tablet (145 mg total) by mouth daily.   FLUoxetine (PROZAC) 40 MG capsule Take 40 mg by mouth daily.   fluticasone (FLONASE) 50 MCG/ACT nasal spray USE ONE SPRAY IN EACH NOSTRIL TWICE A DAY   gabapentin (NEURONTIN) 300 MG capsule Take 300 mg by mouth 2 (two) times daily.   glucose blood (ACCU-CHEK GUIDE) test strip check your blood sugar once a day   hydrochlorothiazide (HYDRODIURIL) 12.5 MG tablet TAKE 1 TABLET BY MOUTH EVERY DAY WITH THE LOSARTAN   JARDIANCE 25 MG TABS tablet TAKE 1 TABLET BY MOUTH EVERY DAY   losartan (COZAAR) 100 MG tablet TAKE 1/2 TABLET BY MOUTH EVERY DAY   metFORMIN (GLUCOPHAGE-XR) 500 MG 24 hr tablet TAKE 2 TABLETS BY MOUTH TWICE A DAY   methocarbamol (ROBAXIN) 500 MG tablet Take 500 mg by mouth 2 (two) times daily.   nitroGLYCERIN (NITROSTAT) 0.4 MG SL tablet Place 1 tablet (0.4 mg total) under the tongue every 5 (five) minutes as needed for chest pain. (Patient not taking: Reported on 04/29/2021)   ondansetron (ZOFRAN) 4 MG tablet Take 1 tablet (4 mg total) by mouth every 8 (eight)  hours as needed for nausea or vomiting. (Patient not taking: Reported on 04/29/2021)   rivaroxaban (XARELTO) 20 MG TABS tablet TAKE 1 TABLET BY MOUTH EVERY DAY BEFORE SUPPER   rosuvastatin (CRESTOR) 20 MG tablet Take 1 tablet (20 mg total) by mouth daily.   No facility-administered encounter medications on file as of 05/05/2021.    Allergies (verified) Actos [pioglitazone], Bee venom, Klonopin [clonazepam], Invokana [canagliflozin], and Niacin   History: Past Medical History:  Diagnosis Date   ANEMIA, PERNICIOUS 04/03/2007   Anxiety  and depression    Arthritis    "left hand" (06/26/2016)   Chronic lower back pain    grd I degen spondylolisthesis at L4-5 w/associated synovial cyst at facet at this level +underlying severe epidural lipomatosis L5-S1 up to L4-5--plan for surg w/Dr. Rolena Infante 12/2020.   Chronic renal insufficiency, stage 3 (moderate) ([place]) 2022   GFR around 60   Colon cancer screening 10/27/2016   Cologuard NEG-->rpt 3 yrs   CORONARY ARTERY DISEASE 12/05/2006   DIABETES MELLITUS, TYPE II dx'd 2001   DVT (deep venous thrombosis) ([place]) 2016   BLE   GERD (gastroesophageal reflux disease)    History of balanitis    x 2. Holding off on circ as of 05/2020 urol f/u   History of blood transfusion 2001   "when I had my hand OR"   History of gout    History of kidney stones    HYPERCHOLESTEROLEMIA 07/17/2007   HYPERTENSION 12/05/2006   Learning disability    Migraine    "1-2/month" (06/26/2016)   Myocardial infarction ([place]) ~ 2003   Obesity, Class II, BMI 35-39.9    OSA on CPAP    Pt cannot recall setting clearly but thinks it is 5 cm h20.     PE (pulmonary embolism) 2016   Peripheral vascular disease ([place])    Simple renal cyst    X 2 on R kidney (one 5 cm and one 2 cm)   Stroke ([place]) ~ 2006   denies residual on 06/26/2016   TIA (transient ischemic attack) ?06/25/2016   2018 and 01/2021 (MRI brain, MR angio head and neck neg 2022)   VISUAL ACUITY, DECREASED, RIGHT EYE 02/19/2009   Past Surgical History:  Procedure Laterality Date   CARDIAC CATHETERIZATION  1990s; 2012; 06/2016   06/2016 distal LAD DES stent.  EF 55-65%   CARDIOVASCULAR STRESS TEST  06/2016   Echo stress test->"inconclusive"-->cath was done after this   CATARACT EXTRACTION W/ INTRAOCULAR LENS IMPLANT Right 2017   CORONARY STENT INTERVENTION N/A 06/27/2016   Procedure: Coronary Stent Intervention;  Surgeon: Leonie Man, MD;  Location: [facility];  Service: Cardiovascular;  Laterality: N/A;   EYE SURGERY Right    Cataract    INGUINAL HERNIA REPAIR Bilateral 1990s   INTRAVASCULAR PRESSURE WIRE/FFR STUDY N/A 06/27/2016   Procedure: Intravascular Pressure Wire/FFR Study;  Surgeon: Leonie Man, MD;  Location: [facility];  Service: Cardiovascular;  Laterality: N/A;   LEFT HEART CATH AND CORONARY ANGIOGRAPHY N/A 06/27/2016   Procedure: Left Heart Cath and Coronary Angiography;  Surgeon: Leonie Man, MD;  Location: [facility];  Service: Cardiovascular;  Laterality: N/A;   LUMBAR LAMINECTOMY/DECOMPRESSION MICRODISCECTOMY Left 01/05/2021   Procedure: Left L4-5 Gill Decompression;  Surgeon: Melina Schools, MD;  Location: [facility];  Service: Orthopedics;  Laterality: Left;   REATTACHMENT HAND Left ~ 2001   w/carpal tunnel release   UMBILICAL HERNIA REPAIR  1990s   w/IHR  Family History  Problem Relation Age of Onset   COPD Mother    Lymphoma Father    Social History   Socioeconomic History   Marital status: Married    Spouse name: Lattie Haw   Number of children: Not on file   Years of education: Not on file   Highest education level: Not on file  Occupational History   Occupation: Disabled    Employer: DISABLED  Tobacco Use   Smoking status: Former    Packs/day: 0.10    Years: 3.00    Pack years: 0.30    Types: Cigarettes    Start date: 1985    Quit date: 05/15/1984    Years since quitting: 36.9   Smokeless tobacco: Never  Vaping Use   Vaping Use: Never used  Substance and Sexual Activity   Alcohol use: No   Drug use: No   Sexual activity: Not on file  Other Topics Concern   Not on file  Social History Narrative   Lives with wife and youngest son   Right Hand   Drinks no caffeine   Social Determinants of Health   Financial Resource Strain: Not on file  Food Insecurity: Not on file  Transportation Needs: Not on file  Physical Activity: Not on file  Stress: Not on file  Social Connections: Not on file    Tobacco Counseling Counseling given: Not Answered   Clinical  Intake:  Pre-visit preparation completed: Yes        Nutritional Risks: None Diabetes: Yes CBG done?: No Did pt. bring in CBG monitor from home?: No      Interpreter Needed?: No      Activities of Daily Living In your present state of health, do you have any difficulty performing the following activities: 05/05/2021 01/26/2021  Hearing? N N  Vision? N N  Difficulty concentrating or making decisions? N N  Walking or climbing stairs? N Y  Comment - -  Dressing or bathing? N N  Doing errands, shopping? N N  Preparing Food and eating ? N -  Using the Toilet? N -  In the past six months, have you accidently leaked urine? N -  Do you have problems with loss of bowel control? N -  Managing your Medications? N -  Managing your Finances? N -  Housekeeping or managing your Housekeeping? N -  Some recent data might be hidden    Patient Care Team: Tammi Sou, MD as PCP - General (Family Medicine) Josue Hector, MD as PCP - Cardiology (Cardiology) [name], MD as Consulting Physician (Endocrinology) Chucky May, MD as Consulting Physician (Psychiatry) Tanda Rockers, MD as Consulting Physician (Pulmonary Disease) Chesley Mires, MD as Consulting Physician (Pulmonary Disease) Lucas Mallow, MD as Consulting Physician (Urology)  Indicate any recent Medical Services you may have received from other than [facility] providers in the past year (date may be approximate).     Assessment:   This is a routine wellness examination for [place].  Hearing/Vision screen No results found.  Dietary issues and exercise activities discussed: Current Exercise Habits: Home exercise routine, Exercise limited by: Other - see comments (back issues)   Goals Addressed   None    Depression Screen PHQ 2/9 Scores 04/29/2021 04/28/2020  PHQ - 2 Score 0 0  PHQ- 9 Score 0 -    Fall Risk Fall Risk  05/05/2021 04/28/2020  Falls in the past year? 0 0  Number falls in past yr: 0 0  Injury with Fall? 0 0  Risk for fall due to : No Fall Risks -  Follow up Falls evaluation completed Falls prevention discussed    FALL RISK PREVENTION PERTAINING TO THE HOME:  Any stairs in or around the home? No  If so, are there any without handrails? No  Home free of loose throw rugs in walkways, pet beds, electrical cords, etc? No  Adequate lighting in your home to reduce risk of falls? Yes   ASSISTIVE DEVICES UTILIZED TO PREVENT FALLS:  Life alert? No  Use of a cane, walker or w/c? Yes  Grab bars in the bathroom? No  Shower chair or bench in shower? No  Elevated toilet seat or a handicapped toilet? Yes   TIMED UP AND GO:  Was the test performed?  N/A .  Length of time to ambulate 10 feet: N/A sec.     Cognitive Function:     6CIT Screen 05/05/2021  What Year? 0 points  What month? 0 points  What time? 0 points  Count back from 20 0 points  Months in reverse 2 points  Repeat phrase 0 points  Total Score 2    Immunizations Immunization History  Administered Date(s) Administered   Influenza,inj,Quad PF,6+ Mos 06/23/2015, 01/10/2018, 01/17/2019, 01/20/2020   PFIZER(Purple Top)SARS-COV-2 Vaccination 12/06/2019, 12/27/2019   PNEUMOCOCCAL CONJUGATE-20 04/29/2021   Pneumococcal Polysaccharide-23 10/07/2010   Td 12/17/2008   Tdap 01/17/2019, 01/22/2020   Zoster Recombinat (Shingrix) 01/22/2020, 04/29/2021    TDAP status: Up to date  Flu Vaccine status: Due, Education has been provided regarding the importance of this vaccine. Advised may receive this vaccine at local pharmacy or Health Dept. Aware to provide a copy of the vaccination record if obtained from local pharmacy or Health Dept. Verbalized acceptance and understanding.  Pneumococcal vaccine status: Up to date  Covid-19 vaccine status: Declined, Education has been provided regarding the importance of this vaccine but patient still declined. Advised may receive this vaccine at local pharmacy or Health  Dept.or vaccine clinic. Aware to provide a copy of the vaccination record if obtained from local pharmacy or Health Dept. Verbalized acceptance and understanding.  Qualifies for Shingles Vaccine? Yes   Zostavax completed No   Shingrix Completed?: Yes  Screening Tests Health Maintenance  Topic Date Due   OPHTHALMOLOGY EXAM  09/21/2015   Fecal DNA (Cologuard)  10/28/2019   COVID-19 Vaccine (3 - Pfizer risk series) 05/15/2021 (Originally 01/24/2020)   INFLUENZA VACCINE  08/12/2021 (Originally 12/13/2020)   Hepatitis C Screening  04/29/2022 (Originally 06/25/1983)   HEMOGLOBIN A1C  09/12/2021   FOOT EXAM  12/29/2021   TETANUS/TDAP  01/21/2030   Pneumococcal Vaccine 60-45 Years old  Completed   Zoster Vaccines- Shingrix  Completed   HPV VACCINES  Aged Out   COLONOSCOPY (Pts 45-55yr Insurance coverage will need to be confirmed)  Discontinued   HIV Screening  Discontinued    Health Maintenance  Health Maintenance Due  Topic Date Due   OPHTHALMOLOGY EXAM  09/21/2015   Fecal DNA (Cologuard)  10/28/2019    Colorectal cancer screening: Type of screening: Cologuard. Completed 10/27/2016. Repeat every 3 years  Lung Cancer Screening: (Low Dose CT Chest recommended if Age 54-80years, 30 pack-year currently smoking OR have quit w/in 15years.) does not qualify.   Lung Cancer Screening Referral: n/a  Additional Screening:  Hepatitis C Screening: does qualify; Completed n/a  Vision Screening: Recommended annual ophthalmology exams for early detection of glaucoma and other disorders of the eye. Is the patient  up to date with their annual eye exam?  No  Who is the provider or what is the name of the office in which the patient attends annual eye exams? N/A If pt is not established with a provider, would they like to be referred to a provider to establish care? Yes .   Dental Screening: Recommended annual dental exams for proper oral hygiene  Community Resource Referral / Chronic Care  Management: CRR required this visit?  No   CCM required this visit?  Yes      Plan:     I have personally reviewed and noted the following in the patients chart:   Medical and social history Use of alcohol, tobacco or illicit drugs  Current medications and supplements including opioid prescriptions. Patient is currently taking opioid prescriptions. Information provided to patient regarding non-opioid alternatives. Patient advised to discuss non-opioid treatment plan with their provider. Functional ability and status Nutritional status Physical activity Advanced directives List of other physicians Hospitalizations, surgeries, and ER visits in previous 12 months Vitals Screenings to include cognitive, depression, and falls Referrals and appointments  In addition, I have reviewed and discussed with patient certain preventive protocols, quality metrics, and best practice recommendations. A written personalized care plan for preventive services as well as general preventive health recommendations were provided to patient.     Octaviano Glow, CMA   05/05/2021   Nurse Notes:   Non-Face to Face or Face to Face 10 minute visit Encounter   Mr. Beulah Gandy , Thank you for taking time to come for your Medicare Wellness Visit. I appreciate your ongoing commitment to your health goals. Please review the following plan we discussed and let me know if I can assist you in the future.   These are the goals we discussed:  Goals      Patient Stated     Increase activity, lose some weight & do more activities for brain stimulation.        This is a list of the screening recommended for you and due dates:  Health Maintenance  Topic Date Due   Eye exam for diabetics  09/21/2015   Cologuard (Stool DNA test)  10/28/2019   COVID-19 Vaccine (3 - Pfizer risk series) 05/15/2021*   Flu Shot  08/12/2021*   Hepatitis C Screening: USPSTF Recommendation to screen - Ages 18-79 yo.  04/29/2022*    Hemoglobin A1C  09/12/2021   Complete foot exam   12/29/2021   Tetanus Vaccine  01/21/2030   Pneumococcal Vaccination  Completed   Zoster (Shingles) Vaccine  Completed   HPV Vaccine  Aged Out   Colon Cancer Screening  Discontinued   HIV Screening  Discontinued  *Topic was postponed. The date shown is not the original due date.

## 2021-05-05 ENCOUNTER — Other Ambulatory Visit: Payer: Self-pay

## 2021-05-05 ENCOUNTER — Ambulatory Visit (INDEPENDENT_AMBULATORY_CARE_PROVIDER_SITE_OTHER): Payer: Medicare Other

## 2021-05-05 DIAGNOSIS — Z0000 Encounter for general adult medical examination without abnormal findings: Secondary | ICD-10-CM | POA: Diagnosis not present

## 2021-05-05 NOTE — Progress Notes (Signed)
° °Subjective:  ° Xavier E Guymon Jr. is a 55 y.o. male who presents for Medicare Annual/Subsequent preventive examination. ° °Review of Systems    °I connected with  Xavier E Broughton Jr. on 05/05/21 by an audio only telemedicine application and verified that I am speaking with the correct person using two identifiers. °  °I discussed the limitations, risks, security and privacy concerns of performing an evaluation and management service by telephone and the availability of in person appointments. I also discussed with the patient that there may be a patient responsible charge related to this service. The patient expressed understanding and verbally consented to this telephonic visit. ° °Location of Patient: home  °Location of Provider: office ° °List any persons and their role that are participating in the visit with the patient.  ° °Xavier [name] °Xavier [name] ° °   °Objective:  °  °There were no vitals filed for this visit. °There is no height or weight on file to calculate BMI. ° °Advanced Directives 05/05/2021 01/26/2021 01/25/2021 01/24/2021 01/05/2021 12/23/2020 09/15/2020  °Does Patient Have a Medical Advance Directive? Yes No No No No No No  °Would patient like information on creating a medical advance directive? - No - Patient declined - - No - Patient declined Yes (MAU/Ambulatory/Procedural Areas - Information given) No - Patient declined  °Pre-existing out of facility DNR order (yellow form or pink MOST form) - - - - - - -  ° ° °Current Medications (verified) °Outpatient Encounter Medications as of 05/05/2021  °Medication Sig  ° Blood Glucose Monitoring Suppl (ACCU-CHEK GUIDE ME) w/Device KIT USE TO CHECK BLOOD SUGAR 1 TIME PER DAY. DX CODE: E11.9  ° bromocriptine (PARLODEL) 2.5 MG tablet Take 1.25 mg by mouth daily.  ° clopidogrel (PLAVIX) 75 MG tablet TAKE 1 TABLET BY MOUTH EVERY DAY  ° cyanocobalamin 1000 MCG tablet Take 1 tablet (1,000 mcg total) by mouth daily.  ° diazepam (VALIUM) 10 MG tablet in the morning  and at bedtime.  ° Dulaglutide (TRULICITY) 4.5 MG/0.5ML SOPN Inject 4.5 mg as directed once a week.  ° DULoxetine (CYMBALTA) 30 MG capsule Take 30 mg by mouth daily.  ° EPINEPHrine 0.3 mg/0.3 mL IJ SOAJ injection Inject 0.3 mg into the muscle as needed for anaphylaxis. (Patient not taking: Reported on 04/29/2021)  ° fenofibrate (TRICOR) 145 MG tablet Take 1 tablet (145 mg total) by mouth daily.  ° FLUoxetine (PROZAC) 40 MG capsule Take 40 mg by mouth daily.  ° fluticasone (FLONASE) 50 MCG/ACT nasal spray USE ONE SPRAY IN EACH NOSTRIL TWICE A DAY  ° gabapentin (NEURONTIN) 300 MG capsule Take 300 mg by mouth 2 (two) times daily.  ° glucose blood (ACCU-CHEK GUIDE) test strip check your blood sugar once a day  ° hydrochlorothiazide (HYDRODIURIL) 12.5 MG tablet TAKE 1 TABLET BY MOUTH EVERY DAY WITH THE LOSARTAN  ° JARDIANCE 25 MG TABS tablet TAKE 1 TABLET BY MOUTH EVERY DAY  ° losartan (COZAAR) 100 MG tablet TAKE 1/2 TABLET BY MOUTH EVERY DAY  ° metFORMIN (GLUCOPHAGE-XR) 500 MG 24 hr tablet TAKE 2 TABLETS BY MOUTH TWICE A DAY  ° methocarbamol (ROBAXIN) 500 MG tablet Take 500 mg by mouth 2 (two) times daily.  ° nitroGLYCERIN (NITROSTAT) 0.4 MG SL tablet Place 1 tablet (0.4 mg total) under the tongue every 5 (five) minutes as needed for chest pain. (Patient not taking: Reported on 04/29/2021)  ° ondansetron (ZOFRAN) 4 MG tablet Take 1 tablet (4 mg total) by mouth every 8 (eight)   hours as needed for nausea or vomiting. (Patient not taking: Reported on 04/29/2021)  ° rivaroxaban (XARELTO) 20 MG TABS tablet TAKE 1 TABLET BY MOUTH EVERY DAY BEFORE SUPPER  ° rosuvastatin (CRESTOR) 20 MG tablet Take 1 tablet (20 mg total) by mouth daily.  ° °No facility-administered encounter medications on file as of 05/05/2021.  ° ° °Allergies (verified) °Actos [pioglitazone], Bee venom, Klonopin [clonazepam], Invokana [canagliflozin], and Niacin  ° °History: °Past Medical History:  °Diagnosis Date  ° ANEMIA, PERNICIOUS 04/03/2007  ° Anxiety  and depression   ° Arthritis   ° "left hand" (06/26/2016)  ° Chronic lower back pain   ° grd I degen spondylolisthesis at L4-5 w/associated synovial cyst at facet at this level +underlying severe epidural lipomatosis L5-S1 up to L4-5--plan for surg w/Dr. Brooks 12/2020.  ° Chronic renal insufficiency, stage 3 (moderate) (HCC) 2022  ° GFR around 60  ° Colon cancer screening 10/27/2016  ° Cologuard NEG-->rpt 3 yrs  ° CORONARY ARTERY DISEASE 12/05/2006  ° DIABETES MELLITUS, TYPE II dx'd 2001  ° DVT (deep venous thrombosis) (HCC) 2016  ° BLE  ° GERD (gastroesophageal reflux disease)   ° History of balanitis   ° x 2. Holding off on circ as of 05/2020 urol f/u  ° History of blood transfusion 2001  ° "when I had my hand OR"  ° History of gout   ° History of kidney stones   ° HYPERCHOLESTEROLEMIA 07/17/2007  ° HYPERTENSION 12/05/2006  ° Learning disability   ° Migraine   ° "1-2/month" (06/26/2016)  ° Myocardial infarction (HCC) ~ 2003  ° Obesity, Class II, BMI 35-39.9   ° OSA on CPAP   ° Pt cannot recall setting clearly but thinks it is 5 cm h20.    ° PE (pulmonary embolism) 2016  ° Peripheral vascular disease (HCC)   ° Simple renal cyst   ° X 2 on R kidney (one 5 cm and one 2 cm)  ° Stroke (HCC) ~ 2006  ° denies residual on 06/26/2016  ° TIA (transient ischemic attack) ?06/25/2016  ° 2018 and 01/2021 (MRI brain, MR angio head and neck neg 2022)  ° VISUAL ACUITY, DECREASED, RIGHT EYE 02/19/2009  ° °Past Surgical History:  °Procedure Laterality Date  ° CARDIAC CATHETERIZATION  1990s; 2012; 06/2016  ° 06/2016 distal LAD DES stent.  EF 55-65%  ° CARDIOVASCULAR STRESS TEST  06/2016  ° Echo stress test->"inconclusive"-->cath was done after this  ° CATARACT EXTRACTION W/ INTRAOCULAR LENS IMPLANT Right 2017  ° CORONARY STENT INTERVENTION N/A 06/27/2016  ° Procedure: Coronary Stent Intervention;  Surgeon: David W Harding, MD;  Location: [facility];  Service: Cardiovascular;  Laterality: N/A;  ° EYE SURGERY Right   ° Cataract  °  INGUINAL HERNIA REPAIR Bilateral 1990s  ° INTRAVASCULAR PRESSURE WIRE/FFR STUDY N/A 06/27/2016  ° Procedure: Intravascular Pressure Wire/FFR Study;  Surgeon: David W Harding, MD;  Location: [facility];  Service: Cardiovascular;  Laterality: N/A;  ° LEFT HEART CATH AND CORONARY ANGIOGRAPHY N/A 06/27/2016  ° Procedure: Left Heart Cath and Coronary Angiography;  Surgeon: David W Harding, MD;  Location: [facility];  Service: Cardiovascular;  Laterality: N/A;  ° LUMBAR LAMINECTOMY/DECOMPRESSION MICRODISCECTOMY Left 01/05/2021  ° Procedure: Left L4-5 Gill Decompression;  Surgeon: Brooks, Dahari, MD;  Location: [facility];  Service: Orthopedics;  Laterality: Left;  ° REATTACHMENT HAND Left ~ 2001  ° w/carpal tunnel release  ° UMBILICAL HERNIA REPAIR  1990s  ° w/IHR  ° °  Family History  °Problem Relation Age of Onset  ° COPD Mother   ° Lymphoma Father   ° °Social History  ° °Socioeconomic History  ° Marital status: Married  °  Spouse name: Lisa  ° Number of children: Not on file  ° Years of education: Not on file  ° Highest education level: Not on file  °Occupational History  ° Occupation: Disabled  °  Employer: DISABLED  °Tobacco Use  ° Smoking status: Former  °  Packs/day: 0.10  °  Years: 3.00  °  Pack years: 0.30  °  Types: Cigarettes  °  Start date: 1985  °  Quit date: 05/15/1984  °  Years since quitting: 36.9  ° Smokeless tobacco: Never  °Vaping Use  ° Vaping Use: Never used  °Substance and Sexual Activity  ° Alcohol use: No  ° Drug use: No  ° Sexual activity: Not on file  °Other Topics Concern  ° Not on file  °Social History Narrative  ° Lives with wife and youngest son  ° Right Hand  ° Drinks no caffeine  ° °Social Determinants of Health  ° °Financial Resource Strain: Not on file  °Food Insecurity: Not on file  °Transportation Needs: Not on file  °Physical Activity: Not on file  °Stress: Not on file  °Social Connections: Not on file  ° ° °Tobacco Counseling °Counseling given: Not Answered ° ° °Clinical  Intake: ° °Pre-visit preparation completed: Yes ° °  ° °  ° °Nutritional Risks: None °Diabetes: Yes °CBG done?: No °Did pt. bring in CBG monitor from home?: No ° °  ° ° °Interpreter Needed?: No ° °  ° ° °Activities of Daily Living °In your present state of health, do you have any difficulty performing the following activities: 05/05/2021 01/26/2021  °Hearing? N N  °Vision? N N  °Difficulty concentrating or making decisions? N N  °Walking or climbing stairs? N Y  °Comment - -  °Dressing or bathing? N N  °Doing errands, shopping? N N  °Preparing Food and eating ? N -  °Using the Toilet? N -  °In the past six months, have you accidently leaked urine? N -  °Do you have problems with loss of bowel control? N -  °Managing your Medications? N -  °Managing your Finances? N -  °Housekeeping or managing your Housekeeping? N -  °Some recent data might be hidden  ° ° °Patient Care Team: °McGowen, Philip H, MD as PCP - General (Family Medicine) °Nishan, Peter C, MD as PCP - Cardiology (Cardiology) °Ellison, Sean, MD as Consulting Physician (Endocrinology) °Kaur, Rupinder, MD as Consulting Physician (Psychiatry) °Wert, Michael B, MD as Consulting Physician (Pulmonary Disease) °Sood, Vineet, MD as Consulting Physician (Pulmonary Disease) °Bell, Eugene D III, MD as Consulting Physician (Urology) ° °Indicate any recent Medical Services you may have received from other than [facility] providers in the past year (date may be approximate). ° °   °Assessment:  ° This is a routine wellness examination for Xavier [name]. ° °Hearing/Vision screen °No results found. ° °Dietary issues and exercise activities discussed: °Current Exercise Habits: Home exercise routine, Exercise limited by: Other - see comments (back issues) ° ° Goals Addressed   °None °  ° °Depression Screen °PHQ 2/9 Scores 04/29/2021 04/28/2020  °PHQ - 2 Score 0 0  °PHQ- 9 Score 0 -  °  °Fall Risk °Fall Risk  05/05/2021 04/28/2020  °Falls in the past year? 0 0  °Number falls in past yr: 0 0   °  Injury with Fall? 0 0  °Risk for fall due to : No Fall Risks -  °Follow up Falls evaluation completed Falls prevention discussed  ° ° °FALL RISK PREVENTION PERTAINING TO THE HOME: ° °Any stairs in or around the home? No  °If so, are there any without handrails? No  °Home free of loose throw rugs in walkways, pet beds, electrical cords, etc? No  °Adequate lighting in your home to reduce risk of falls? Yes  ° °ASSISTIVE DEVICES UTILIZED TO PREVENT FALLS: ° °Life alert? No  °Use of a cane, walker or w/c? Yes  °Grab bars in the bathroom? No  °Shower chair or bench in shower? No  °Elevated toilet seat or a handicapped toilet? Yes  ° °TIMED UP AND GO: ° °Was the test performed?  N/A .  °Length of time to ambulate 10 feet: N/A sec.  ° ° ° °Cognitive Function: °  °  °6CIT Screen 05/05/2021  °What Year? 0 points  °What month? 0 points  °What time? 0 points  °Count back from 20 0 points  °Months in reverse 2 points  °Repeat phrase 0 points  °Total Score 2  ° ° °Immunizations °Immunization History  °Administered Date(s) Administered  ° Influenza,inj,Quad PF,6+ Mos 06/23/2015, 01/10/2018, 01/17/2019, 01/20/2020  ° PFIZER(Purple Top)SARS-COV-2 Vaccination 12/06/2019, 12/27/2019  ° PNEUMOCOCCAL CONJUGATE-20 04/29/2021  ° Pneumococcal Polysaccharide-23 10/07/2010  ° Td 12/17/2008  ° Tdap 01/17/2019, 01/22/2020  ° Zoster Recombinat (Shingrix) 01/22/2020, 04/29/2021  ° ° °TDAP status: Up to date ° °Flu Vaccine status: Due, Education has been provided regarding the importance of this vaccine. Advised may receive this vaccine at local pharmacy or Health Dept. Aware to provide a copy of the vaccination record if obtained from local pharmacy or Health Dept. Verbalized acceptance and understanding. ° °Pneumococcal vaccine status: Up to date ° °Covid-19 vaccine status: Declined, Education has been provided regarding the importance of this vaccine but patient still declined. Advised may receive this vaccine at local pharmacy or Health  Dept.or vaccine clinic. Aware to provide a copy of the vaccination record if obtained from local pharmacy or Health Dept. Verbalized acceptance and understanding. ° °Qualifies for Shingles Vaccine? Yes   °Zostavax completed No   °Shingrix Completed?: Yes ° °Screening Tests °Health Maintenance  °Topic Date Due  ° OPHTHALMOLOGY EXAM  09/21/2015  ° Fecal DNA (Cologuard)  10/28/2019  ° COVID-19 Vaccine (3 - Pfizer risk series) 05/15/2021 (Originally 01/24/2020)  ° INFLUENZA VACCINE  08/12/2021 (Originally 12/13/2020)  ° Hepatitis C Screening  04/29/2022 (Originally 06/25/1983)  ° HEMOGLOBIN A1C  09/12/2021  ° FOOT EXAM  12/29/2021  ° TETANUS/TDAP  01/21/2030  ° Pneumococcal Vaccine 19-64 Years old  Completed  ° Zoster Vaccines- Shingrix  Completed  ° HPV VACCINES  Aged Out  ° COLONOSCOPY (Pts 45-49yrs Insurance coverage will need to be confirmed)  Discontinued  ° HIV Screening  Discontinued  ° ° °Health Maintenance ° °Health Maintenance Due  °Topic Date Due  ° OPHTHALMOLOGY EXAM  09/21/2015  ° Fecal DNA (Cologuard)  10/28/2019  ° ° °Colorectal cancer screening: Type of screening: Cologuard. Completed 10/27/2016. Repeat every 3 years ° °Lung Cancer Screening: (Low Dose CT Chest recommended if Age 55-80 years, 30 pack-year currently smoking OR have quit w/in 15years.) does not qualify.  ° °Lung Cancer Screening Referral: n/a ° °Additional Screening: ° °Hepatitis C Screening: does qualify; Completed n/a ° °Vision Screening: Recommended annual ophthalmology exams for early detection of glaucoma and other disorders of the eye. °Is the patient   up to date with their annual eye exam?  No  °Who is the provider or what is the name of the office in which the patient attends annual eye exams? N/A °If pt is not established with a provider, would they like to be referred to a provider to establish care? Yes .  ° °Dental Screening: Recommended annual dental exams for proper oral hygiene ° °Community Resource Referral / Chronic Care  Management: °CRR required this visit?  No  ° °CCM required this visit?  Yes  ° ° °  °Plan:  °  ° °I have personally reviewed and noted the following in the patient’s chart:  ° °Medical and social history °Use of alcohol, tobacco or illicit drugs  °Current medications and supplements including opioid prescriptions. Patient is currently taking opioid prescriptions. Information provided to patient regarding non-opioid alternatives. Patient advised to discuss non-opioid treatment plan with their provider. °Functional ability and status °Nutritional status °Physical activity °Advanced directives °List of other physicians °Hospitalizations, surgeries, and ER visits in previous 12 months °Vitals °Screenings to include cognitive, depression, and falls °Referrals and appointments ° °In addition, I have reviewed and discussed with patient certain preventive protocols, quality metrics, and best practice recommendations. A written personalized care plan for preventive services as well as general preventive health recommendations were provided to patient. °  ° ° °Darlys Buis J Nataki Mccrumb, CMA   05/05/2021  ° °Nurse Notes:  ° °Non-Face to Face or Face to Face 10 minute visit Encounter ° ° °Mr. Hoppes , °Thank you for taking time to come for your Medicare Wellness Visit. I appreciate your ongoing commitment to your health goals. Please review the following plan we discussed and let me know if I can assist you in the future.  ° °These are the goals we discussed: ° Goals   ° °  Patient Stated   °  Increase activity, lose some weight & do more activities for brain stimulation. °  ° °  °  °This is a list of the screening recommended for you and due dates:  °Health Maintenance  °Topic Date Due  ° Eye exam for diabetics  09/21/2015  ° Cologuard (Stool DNA test)  10/28/2019  ° COVID-19 Vaccine (3 - Pfizer risk series) 05/15/2021*  ° Flu Shot  08/12/2021*  ° Hepatitis C Screening: USPSTF Recommendation to screen - Ages 18-79 yo.  04/29/2022*  °  Hemoglobin A1C  09/12/2021  ° Complete foot exam   12/29/2021  ° Tetanus Vaccine  01/21/2030  ° Pneumococcal Vaccination  Completed  ° Zoster (Shingles) Vaccine  Completed  ° HPV Vaccine  Aged Out  ° Colon Cancer Screening  Discontinued  ° HIV Screening  Discontinued  °*Topic was postponed. The date shown is not the original due date.  °  ° ° ° °

## 2021-05-13 DIAGNOSIS — Z1211 Encounter for screening for malignant neoplasm of colon: Secondary | ICD-10-CM | POA: Diagnosis not present

## 2021-05-17 DIAGNOSIS — Z4889 Encounter for other specified surgical aftercare: Secondary | ICD-10-CM | POA: Diagnosis not present

## 2021-05-17 DIAGNOSIS — M545 Low back pain, unspecified: Secondary | ICD-10-CM | POA: Diagnosis not present

## 2021-05-18 ENCOUNTER — Encounter: Payer: Self-pay | Admitting: Family Medicine

## 2021-05-18 ENCOUNTER — Ambulatory Visit (INDEPENDENT_AMBULATORY_CARE_PROVIDER_SITE_OTHER): Payer: Medicare Other | Admitting: Family Medicine

## 2021-05-18 ENCOUNTER — Other Ambulatory Visit: Payer: Self-pay

## 2021-05-18 VITALS — BP 116/78 | HR 69 | Temp 98.2°F | Wt 255.6 lb

## 2021-05-18 DIAGNOSIS — R042 Hemoptysis: Secondary | ICD-10-CM | POA: Diagnosis not present

## 2021-05-18 DIAGNOSIS — Z7901 Long term (current) use of anticoagulants: Secondary | ICD-10-CM | POA: Diagnosis not present

## 2021-05-18 NOTE — Progress Notes (Signed)
OFFICE VISIT  05/18/2021  CC:  Chief Complaint  Patient presents with   Follow-up   Hypertension   HPI:    Patient is a 56 y.o. male who presents for hemoptysis. I last saw him 04/29/21. A/P as of that visit: "#1 hypertension, well controlled.  Continue one half of a losartan 100 mg tab and HCTZ 12.5 mg a day. Electrolytes and creatinine today.  2.  Vitamin B12 deficiency.  He has been on oral vitamin B12 supplement for the last 3 months or so.  Checking B12 level today.  3.  History of TIA.  No currents of symptoms.  He is on Plavix and statin. Lipids and hepatic panel checked today.  Keep neuro follow-up  4.  Chronic renal insufficiency stage II/III: He avoids NSAIDs. Electrolytes and creatinine monitor today   #5 history of DVT and PE.  Long-term anticoagulation with Xarelto 20 mg a day.  No signs of bleeding.  Last hemoglobin 3 months ago normal.  No changes.   6 health maintenance exam: Reviewed age and gender appropriate health maintenance issues (prudent diet, regular exercise, health risks of tobacco and excessive alcohol, use of seatbelts, fire alarms in home, use of sunscreen).  Also reviewed age and gender appropriate health screening as well as vaccine recommendations. Vaccines: Prevnar 20->given today.  Shingrix #2->given today.  Flu->declined. Labs: cbc, cmet, flp, vit b12, PSA. Prostate ca screening: PSA today. Colon ca screening: due for repeat cologuard->ordered."  INTERIM HX: All labs normal last visit. Patient with episode of hemoptysis about 10 days ago.  Couple of small blobs of red blood.  He had not had a cough, sore throat, nasal mucus, voice changes, or reflux. It happened a couple more times over the course of a couple hours that day, small amounts each time.  No further episodes after that.  He had no shortness of breath or chest pain or dizziness.  He essentially felt good. Patient has been on Plavix for coronary artery disease and Xarelto for history  of pulmonary embolism.  CT chest angio 01/25/21: IMPRESSION: 1. No pulmonary embolus. 2. No acute intrathoracic abnormality. 3.  Aortic Atherosclerosis (ICD10-I70.0).   Past Medical History:  Diagnosis Date   ANEMIA, PERNICIOUS 04/03/2007   Anxiety and depression    Arthritis    "left hand" (06/26/2016)   Chronic lower back pain    grd I degen spondylolisthesis at L4-5 w/associated synovial cyst at facet at this level +underlying severe epidural lipomatosis L5-S1 up to L4-5--plan for surg w/Dr. Rolena Infante 12/2020.   Chronic renal insufficiency, stage 3 (moderate) ([place]) 2022   GFR around 60   Colon cancer screening 10/27/2016   Cologuard NEG-->rpt 3 yrs   CORONARY ARTERY DISEASE 12/05/2006   DIABETES MELLITUS, TYPE II dx'd 2001   DVT (deep venous thrombosis) ([place]) 2016   BLE   GERD (gastroesophageal reflux disease)    History of balanitis    x 2. Holding off on circ as of 05/2020 urol f/u   History of blood transfusion 2001   "when I had my hand OR"   History of gout    History of kidney stones    HYPERCHOLESTEROLEMIA 07/17/2007   HYPERTENSION 12/05/2006   Learning disability    Migraine    "1-2/month" (06/26/2016)   Myocardial infarction [facility]) ~ 2003   Obesity, Class II, BMI 35-39.9    OSA on CPAP    Pt cannot recall setting clearly but thinks it is 5 cm h20.     PE (  pulmonary embolism) 2016   Peripheral vascular disease ([place])    Simple renal cyst    X 2 on R kidney (one 5 cm and one 2 cm)   Stroke ([place]) ~ 2006   denies residual on 06/26/2016   TIA (transient ischemic attack) ?06/25/2016   2018 and 01/2021 (MRI brain, MR angio head and neck neg 2022)   VISUAL ACUITY, DECREASED, RIGHT EYE 02/19/2009    Past Surgical History:  Procedure Laterality Date   CARDIAC CATHETERIZATION  1990s; 2012; 06/2016   06/2016 distal LAD DES stent.  EF 55-65%   CARDIOVASCULAR STRESS TEST  06/2016   Echo stress test->"inconclusive"-->cath was done after this   CATARACT EXTRACTION W/  INTRAOCULAR LENS IMPLANT Right 2017   CORONARY STENT INTERVENTION N/A 06/27/2016   Procedure: Coronary Stent Intervention;  Surgeon: Leonie Man, MD;  Location: [facility];  Service: Cardiovascular;  Laterality: N/A;   EYE SURGERY Right    Cataract   INGUINAL HERNIA REPAIR Bilateral 1990s   INTRAVASCULAR PRESSURE WIRE/FFR STUDY N/A 06/27/2016   Procedure: Intravascular Pressure Wire/FFR Study;  Surgeon: Leonie Man, MD;  Location: [facility];  Service: Cardiovascular;  Laterality: N/A;   LEFT HEART CATH AND CORONARY ANGIOGRAPHY N/A 06/27/2016   Procedure: Left Heart Cath and Coronary Angiography;  Surgeon: Leonie Man, MD;  Location: [facility];  Service: Cardiovascular;  Laterality: N/A;   LUMBAR LAMINECTOMY/DECOMPRESSION MICRODISCECTOMY Left 01/05/2021   Procedure: Left L4-5 Gill Decompression;  Surgeon: Melina Schools, MD;  Location: [facility];  Service: Orthopedics;  Laterality: Left;   REATTACHMENT HAND Left ~ 2001   w/carpal tunnel release   UMBILICAL HERNIA REPAIR  1990s   w/IHR    Outpatient Medications Prior to Visit  Medication Sig Dispense Refill   Blood Glucose Monitoring Suppl (ACCU-CHEK GUIDE ME) w/Device KIT USE TO CHECK BLOOD SUGAR 1 TIME PER DAY. DX CODE: E11.9 1 kit 0   bromocriptine (PARLODEL) 2.5 MG tablet Take 1.25 mg by mouth daily.     clopidogrel (PLAVIX) 75 MG tablet TAKE 1 TABLET BY MOUTH EVERY DAY 90 tablet 3   cyanocobalamin 1000 MCG tablet Take 1 tablet (1,000 mcg total) by mouth daily. 90 tablet 3   diazepam (VALIUM) 10 MG tablet in the morning and at bedtime.     Dulaglutide (TRULICITY) 4.5 AU/6.3FH SOPN Inject 4.5 mg as directed once a week. 6 mL 3   DULoxetine (CYMBALTA) 30 MG capsule Take 30 mg by mouth daily.     EPINEPHrine 0.3 mg/0.3 mL IJ SOAJ injection Inject 0.3 mg into the muscle as needed for anaphylaxis. 2 each 1   fenofibrate (TRICOR) 145 MG tablet Take 1 tablet (145 mg total) by mouth daily. 90 tablet 3   FLUoxetine  (PROZAC) 40 MG capsule Take 40 mg by mouth daily.     fluticasone (FLONASE) 50 MCG/ACT nasal spray USE ONE SPRAY IN EACH NOSTRIL TWICE A DAY 48 g 3   gabapentin (NEURONTIN) 300 MG capsule Take 300 mg by mouth 2 (two) times daily.     glucose blood (ACCU-CHEK GUIDE) test strip check your blood sugar once a day 100 each 12   hydrochlorothiazide (HYDRODIURIL) 12.5 MG tablet TAKE 1 TABLET BY MOUTH EVERY DAY WITH THE LOSARTAN 90 tablet 3   JARDIANCE 25 MG TABS tablet TAKE 1 TABLET BY MOUTH EVERY DAY 90 tablet 1   losartan (COZAAR) 100 MG tablet TAKE 1/2 TABLET BY MOUTH EVERY DAY 45 tablet 0  metFORMIN (GLUCOPHAGE-XR) 500 MG 24 hr tablet TAKE 2 TABLETS BY MOUTH TWICE A DAY 360 tablet 0   methocarbamol (ROBAXIN) 500 MG tablet Take 500 mg by mouth 2 (two) times daily.     nitroGLYCERIN (NITROSTAT) 0.4 MG SL tablet Place 1 tablet (0.4 mg total) under the tongue every 5 (five) minutes as needed for chest pain. 25 tablet 3   ondansetron (ZOFRAN) 4 MG tablet Take 1 tablet (4 mg total) by mouth every 8 (eight) hours as needed for nausea or vomiting. 20 tablet 0   rivaroxaban (XARELTO) 20 MG TABS tablet TAKE 1 TABLET BY MOUTH EVERY DAY BEFORE SUPPER 90 tablet 3   rosuvastatin (CRESTOR) 20 MG tablet Take 1 tablet (20 mg total) by mouth daily. 90 tablet 3   No facility-administered medications prior to visit.    Allergies  Allergen Reactions   Actos [Pioglitazone] Other (See Comments)    Potential cause of DVT   Bee Venom Anaphylaxis   Klonopin [Clonazepam] Anaphylaxis   Invokana [Canagliflozin] Nausea Only    Nausea/dizzy/bad taste in mouth   Niacin Other (See Comments)    "makes his crazy"    ROS As per HPI  PE: Vitals with BMI 05/18/2021 04/29/2021 03/15/2021  Height - 5' 11"  5' 11"   Weight 255 lbs 10 oz 244 lbs 10 oz 253 lbs  BMI 35.66 69.45 03.8  Systolic 882 800 349  Diastolic 78 74 70  Pulse 69 72 78  02 sat 98% on RA today   Physical Exam  Gen: Alert, well appearing.  Patient is  oriented to person, place, time, and situation. AFFECT: pleasant, lucid thought and speech. No pallor. Nose clear, no dried blood. Oropharynx and throat without lesion or blood. Neck supple without lymphadenopathy. CV: RRR, no m/r/g.   LUNGS: CTA bilat, nonlabored resps, good aeration in all lung fields.   LABS:  Last CBC Lab Results  Component Value Date   WBC 7.5 01/26/2021   HGB 13.6 01/26/2021   HCT 39.5 01/26/2021   MCV 90.4 01/26/2021   MCH 31.1 01/26/2021   RDW 13.1 01/26/2021   PLT 465 (H) 01/26/2021   Lab Results  Component Value Date   DDIMER 0.63 (H) 01/25/2021  D dimer 06/23/15 1.79  Last metabolic panel Lab Results  Component Value Date   GLUCOSE 128 (H) 04/29/2021   NA 139 04/29/2021   K 4.4 04/29/2021   CL 101 04/29/2021   CO2 27 04/29/2021   BUN 15 04/29/2021   CREATININE 1.24 04/29/2021   GFRNONAA >60 01/26/2021   CALCIUM 10.4 04/29/2021   PHOS 4.0 06/23/2015   PROT 7.6 04/29/2021   ALBUMIN 4.4 04/29/2021   BILITOT 0.6 04/29/2021   ALKPHOS 48 04/29/2021   AST 22 04/29/2021   ALT 33 04/29/2021   ANIONGAP 9 01/26/2021   Last lipids Lab Results  Component Value Date   CHOL 131 04/29/2021   HDL 58.60 04/29/2021   LDLCALC 47 01/26/2021   LDLDIRECT 58.0 04/29/2021   TRIG 202.0 (H) 04/29/2021   CHOLHDL 2 04/29/2021   Last hemoglobin A1c Lab Results  Component Value Date   HGBA1C 6.7 (A) 03/15/2021   Last thyroid functions Lab Results  Component Value Date   TSH 1.51 03/15/2021   Last vitamin B12 and Folate Lab Results  Component Value Date   VITAMINB12 446 04/29/2021   FOLATE 13.6 06/12/2008   IMPRESSION AND PLAN:  #1 hemoptysis, isolated episode about 10 days ago. No other symptoms with this at all. Reassuringly,  he had a CT angiogram about 3 to 4 months ago that was normal. He is on Plavix and Xarelto. Overall I think we can just watch this, no further work-up at this time.  Continue all current medications. Signs/symptoms to  call or return for were reviewed and pt expressed understanding.  An After Visit Summary was printed and given to the patient.  FOLLOW UP: Return for Keep appt already set for 10/28/21.  Signed:  Crissie Sickles, MD           05/18/2021

## 2021-05-21 LAB — COLOGUARD: COLOGUARD: NEGATIVE

## 2021-06-03 DIAGNOSIS — Z4889 Encounter for other specified surgical aftercare: Secondary | ICD-10-CM | POA: Diagnosis not present

## 2021-06-14 ENCOUNTER — Telehealth: Payer: Self-pay

## 2021-06-14 NOTE — Telephone Encounter (Signed)
LM for pt to return call. We recently received a form for certificate of medical necessity for water circulating heat pad with pump from [facility].

## 2021-06-15 ENCOUNTER — Telehealth: Payer: Self-pay | Admitting: Family Medicine

## 2021-06-15 NOTE — Telephone Encounter (Signed)
Spoke with pt's wife, Lattie Haw and the only thing she mentioned was Dr.Brooks possibly sending something based on recent OV 06/03/21. I did advise this form did not look legitimate. Pt's wife would like form scanned into chart so we have something on file if more forms received in the future.

## 2021-06-21 ENCOUNTER — Other Ambulatory Visit: Payer: Self-pay

## 2021-06-21 MED ORDER — EPINEPHRINE 0.3 MG/0.3ML IJ SOAJ: 0.3000 mg | INTRAMUSCULAR | 1 refills | Status: DC | PRN

## 2021-06-24 ENCOUNTER — Other Ambulatory Visit: Payer: Self-pay | Admitting: Endocrinology

## 2021-06-27 ENCOUNTER — Other Ambulatory Visit: Payer: Self-pay | Admitting: Family Medicine

## 2021-06-27 NOTE — Telephone Encounter (Signed)
Pt called about needing medication:  Nystartin (cream) Along with antibiotic, I didn't see it in patient med list. Pt said Dr. Lenna Gilford it for him.  Informed pt that Dr. Milinda Cave is out of the office today along with [facility]. Said I'll send a note back.   [id number] - [place], [place] - [address] Phone:  [phone]  Fax:  [phone]

## 2021-06-28 MED ORDER — FLUCONAZOLE 150 MG PO TABS: ORAL_TABLET | ORAL | 1 refills | Status: AC

## 2021-06-28 MED ORDER — NYSTATIN 100000 UNIT/GM EX CREA: 1.0000 "application " | TOPICAL_CREAM | Freq: Two times a day (BID) | CUTANEOUS | 1 refills | Status: AC

## 2021-06-28 NOTE — Telephone Encounter (Signed)
Last rx provided for nystatin cream is 11/30/1(30g,1). Pt advised to stop taking at discharge. Pt would also like refill for diflucan. Last rx given 12/08/20-01/08/21 (4 tab, 1 rf). Pt has been c/o yeast off/on for the last 4 weeks.    Please fill, if appropriate. Meds pending.

## 2021-06-28 NOTE — Telephone Encounter (Signed)
Pt advised refills sent. °

## 2021-07-08 ENCOUNTER — Other Ambulatory Visit: Payer: Self-pay

## 2021-07-08 MED ORDER — LOSARTAN POTASSIUM 100 MG PO TABS: 50.0000 mg | ORAL_TABLET | Freq: Every day | ORAL | 0 refills | Status: DC

## 2021-07-14 ENCOUNTER — Other Ambulatory Visit: Payer: Self-pay

## 2021-07-14 ENCOUNTER — Encounter (HOSPITAL_BASED_OUTPATIENT_CLINIC_OR_DEPARTMENT_OTHER): Payer: Self-pay | Admitting: Emergency Medicine

## 2021-07-14 ENCOUNTER — Emergency Department (HOSPITAL_BASED_OUTPATIENT_CLINIC_OR_DEPARTMENT_OTHER)
Admission: EM | Admit: 2021-07-14 | Discharge: 2021-07-14 | Disposition: A | Discharge status: Home / Self Care | Payer: Medicare Other | Attending: Emergency Medicine | Admitting: Emergency Medicine

## 2021-07-14 ENCOUNTER — Emergency Department (HOSPITAL_BASED_OUTPATIENT_CLINIC_OR_DEPARTMENT_OTHER): Payer: Medicare Other | Admitting: Radiology

## 2021-07-14 DIAGNOSIS — Z7901 Long term (current) use of anticoagulants: Secondary | ICD-10-CM | POA: Insufficient documentation

## 2021-07-14 DIAGNOSIS — Z7902 Long term (current) use of antithrombotics/antiplatelets: Secondary | ICD-10-CM | POA: Insufficient documentation

## 2021-07-14 DIAGNOSIS — Z7984 Long term (current) use of oral hypoglycemic drugs: Secondary | ICD-10-CM | POA: Diagnosis not present

## 2021-07-14 DIAGNOSIS — M25511 Pain in right shoulder: Secondary | ICD-10-CM | POA: Insufficient documentation

## 2021-07-14 DIAGNOSIS — Z79899 Other long term (current) drug therapy: Secondary | ICD-10-CM | POA: Insufficient documentation

## 2021-07-14 MED ORDER — OXYCODONE-ACETAMINOPHEN 5-325 MG PO TABS
1.0000 | ORAL_TABLET | Freq: Once | ORAL | Status: AC
  Administered 2021-07-14: 1 via ORAL
  Filled 2021-07-14: qty 1

## 2021-07-14 MED ORDER — OXYCODONE-ACETAMINOPHEN 5-325 MG PO TABS: 1.0000 | ORAL_TABLET | Freq: Three times a day (TID) | ORAL | 0 refills | Status: DC | PRN

## 2021-07-14 NOTE — ED Triage Notes (Signed)
Pt arrives to ED with c/o right shoulder pain. Pt reports he has chronic right should pain but two days ago he started to experience severe pain. He reports he is unable to lift his arm above his head. He had MRI of the shoulder in 10/2018 which showed a labral tear, he was scheduled to have surgery  but was not able to due to other circumstances. No alleviating pain.  ?

## 2021-07-14 NOTE — ED Provider Notes (Signed)
[facility] Provider Note   CSN: [id number] Arrival date & time: 07/14/21  4196     History  Chief Complaint  Patient presents with   Shoulder Pain    Xavier [name]. is a 56 y.o. male who presents to the emergency department complaining of right shoulder pain onset 2 days.  Denies injury, trauma, fall. Patient notes that he was diagnosed with a labral tear of the right shoulder in 2020 and was supposed to have surgery then but it was canceled because of COVID.  He notes that he has yet to follow-up with his orthopedist to schedule a repeat surgery due to his symptoms subsiding.  His shoulder pain is worse with movement of the shoulder.  Has tried Tylenol and ice with no relief in his symptoms.  Denies fever, chills, numbness, tingling, neck pain.    The history is provided by the patient and the spouse. No language interpreter was used.      Home Medications Prior to Admission medications   Medication Sig Start Date End Date Taking? Authorizing Provider  Blood Glucose Monitoring Suppl (ACCU-CHEK GUIDE ME) w/Device KIT USE TO CHECK BLOOD SUGAR 1 TIME PER DAY. DX CODE: E11.9 10/13/20   [name], MD  bromocriptine (PARLODEL) 2.5 MG tablet Take 1.25 mg by mouth daily.    [provider]  clopidogrel (PLAVIX) 75 MG tablet TAKE 1 TABLET BY MOUTH EVERY DAY 11/09/20   Josue Hector, MD  cyanocobalamin 1000 MCG tablet Take 1 tablet (1,000 mcg total) by mouth daily. 03/15/21   [name], MD  diazepam (VALIUM) 10 MG tablet in the morning and at bedtime.    [provider]  Dulaglutide (TRULICITY) 4.5 QI/2.9NL SOPN Inject 4.5 mg as directed once a week. 01/14/21   [name], MD  DULoxetine (CYMBALTA) 30 MG capsule Take 30 mg by mouth daily.    [provider]  EPINEPHrine 0.3 mg/0.3 mL IJ SOAJ injection Inject 0.3 mg into the muscle as needed for anaphylaxis. 06/21/21   McGowen, [place] Blackwater, MD  fenofibrate (TRICOR) 145 MG tablet  Take 1 tablet (145 mg total) by mouth daily. 04/29/21   McGowen, [place] Blackwater, MD  fluconazole (DIFLUCAN) 150 MG tablet TAKE 1 TABLET BY MOUTH EVERY 7 DAYS 06/28/21   McGowen, [place] Blackwater, MD  FLUoxetine (PROZAC) 40 MG capsule Take 40 mg by mouth daily.    [provider]  fluticasone (FLONASE) 50 MCG/ACT nasal spray USE ONE SPRAY IN EACH NOSTRIL TWICE A DAY 04/29/21   McGowen, [place] Blackwater, MD  gabapentin (NEURONTIN) 300 MG capsule Take 300 mg by mouth 2 (two) times daily. 12/13/20   [provider]  glucose blood (ACCU-CHEK GUIDE) test strip check your blood sugar once a day 09/26/20   [name], MD  hydrochlorothiazide (HYDRODIURIL) 12.5 MG tablet TAKE 1 TABLET BY MOUTH EVERY DAY WITH THE LOSARTAN 04/29/21   McGowen, [place] Blackwater, MD  JARDIANCE 25 MG TABS tablet TAKE 1 TABLET BY MOUTH EVERY DAY 02/24/21   [name], MD  losartan (COZAAR) 100 MG tablet Take 0.5 tablets (50 mg total) by mouth daily. 07/08/21   McGowen, [place] Blackwater, MD  metFORMIN (GLUCOPHAGE-XR) 500 MG 24 hr tablet TAKE 2 TABLETS BY MOUTH TWICE A DAY 06/24/21   [name], MD  methocarbamol (ROBAXIN) 500 MG tablet Take 500 mg by mouth 2 (two) times daily.    [provider]  nitroGLYCERIN (NITROSTAT) 0.4 MG SL tablet Place 1 tablet (0.4 mg total)  under the tongue every 5 (five) minutes as needed for chest pain. 12/02/20   Josue Hector, MD  nystatin cream (MYCOSTATIN) Apply 1 application topically 2 (two) times daily. 06/28/21   McGowen, [place] Blackwater, MD  ondansetron (ZOFRAN) 4 MG tablet Take 1 tablet (4 mg total) by mouth every 8 (eight) hours as needed for nausea or vomiting. 01/05/21   Melina Schools, MD  rivaroxaban (XARELTO) 20 MG TABS tablet TAKE 1 TABLET BY MOUTH EVERY DAY BEFORE SUPPER 04/29/21   McGowen, [place] Blackwater, MD  rosuvastatin (CRESTOR) 20 MG tablet Take 1 tablet (20 mg total) by mouth daily. 03/01/21 02/24/22  Loel Dubonnet, NP      Allergies    Actos [pioglitazone], Bee venom, Klonopin [clonazepam],  Invokana [canagliflozin], and Niacin    Review of Systems   Review of Systems  Constitutional:  Negative for chills and fever.  Musculoskeletal:  Positive for arthralgias. Negative for back pain, joint swelling and neck pain.  Skin:  Negative for color change, rash and wound.  Neurological:  Positive for weakness (Secondary to pain). Negative for numbness.       + Tingling to right fingers  All other systems reviewed and are negative.  Physical Exam Updated Vital Signs BP 135/87 (BP Location: Right Arm)    Pulse 71    Temp 97.7 F (36.5 C) (Temporal)    Resp 18    Ht _0  (1.803 m)    Wt 111.1 kg    SpO2 98%    BMI 34.17 kg/m  Physical Exam Vitals and nursing note reviewed.  Constitutional:      General: He is not in acute distress.    Appearance: Normal appearance.  Eyes:     General: No scleral icterus.    Extraocular Movements: Extraocular movements intact.  Cardiovascular:     Rate and Rhythm: Normal rate.  Pulmonary:     Effort: Pulmonary effort is normal. No respiratory distress.  Musculoskeletal:     Right shoulder: Tenderness present. No swelling, deformity, effusion, laceration or bony tenderness. Decreased range of motion (Secondary to pain). Decreased strength (Secondary to pain).     Left shoulder: Normal.     Right upper arm: Normal.     Left upper arm: Normal.     Cervical back: Normal and neck supple.     Thoracic back: Normal.     Lumbar back: Normal.     Comments: Moderate tenderness to palpation to anterior right shoulder. No obvious deformity, effusion, erythema, or swelling.  Decreased range of motion of right shoulder secondary to pain.  No tenderness to palpation noted to right humerus, olecranon, forearm, hand, phalanges.  Capillary refill less than 2 seconds.  No C, T, L, S spinal tenderness to palpation.  Strength and sensation intact to bilateral lower extremities.  Strength and sensation intact to bilateral upper extremities.  Grip strength 5/5  bilaterally.  Skin:    General: Skin is warm and dry.     Findings: No bruising, erythema or rash.  Neurological:     Mental Status: He is alert.  Psychiatric:        Behavior: Behavior normal.    ED Results / Procedures / Treatments   Labs (all labs ordered are listed, but only abnormal results are displayed) Labs Reviewed - No data to display  EKG None  Radiology DG Shoulder Right  Result Date: 07/14/2021 CLINICAL DATA:  Acute on chronic right shoulder pain. EXAM: RIGHT SHOULDER - 2+ VIEW COMPARISON:  Right shoulder MRI 10/21/2018 FINDINGS: No acute fracture or dislocation is identified. Moderate acromioclavicular osteoarthrosis is again noted. Glenohumeral joint space width is preserved. The soft tissues are unremarkable. IMPRESSION: No acute osseous abnormality identified. Electronically Signed   By: Logan Bores M.D.   On: 07/14/2021 10:09    Procedures Procedures    Medications Ordered in ED Medications  oxyCODONE-acetaminophen (PERCOCET/ROXICET) 5-325 MG per tablet 1 tablet (has no administration in time range)    ED Course/ Medical Decision Making/ A&P Clinical Course as of 07/14/21 1510  Thu Jul 14, 2021  1112 Re-evaluated and noted improvement of symptoms with treatment regimen. Discussed discharge treatment plan. Pt agreeable at this time. Pt appears safe for discharge. [SB]    Clinical Course User Index [SB] Aniqa Hare A, PA-C                           Medical Decision Making Amount and/or Complexity of Data Reviewed Radiology: ordered.  Risk Prescription drug management.   Patient with right shoulder pain onset 2 days ago.  Denies injury, trauma, fall.  Patient right shoulder pain initially started in 2020 when he was diagnosed with a labral tear via MRI.  He was due to have surgery however it was canceled due to [place].  Vital signs stable, patient afebrile, not tachycardic or hypoxic.  On exam patient with moderate tenderness to palpation noted to  anterior right shoulder without obvious deformity, effusion, erythema or swelling.  Decreased range of motion of right shoulder secondary to pain.  No tenderness to palpation noted to remaining right upper arm.  Capillary refill less than 2 seconds.  No spinal tenderness to palpation.  Strength and sensation intact to bilateral upper and lower extremities.  Grip strength 5/5 bilaterally.  Differential diagnosis includes strain, rotator cuff pathology, septic arthritis, fracture, dislocation, osteoarthritis.  Additional history obtained:  Additional history obtained from Spouse/Significant Other External records from outside source obtained and reviewed including: Per patient chart review, MRI in 10/2018 showed labral tear.   Imaging: I ordered imaging studies including right shoulder x-ray I independently visualized and interpreted imaging which showed: Negative for osseous abnormality I agree with the radiologist interpretation  Medications:  I ordered medication including Percocet for pain management Reevaluation of the patient after these medicines and interventions, I reevaluated the patient and found that they have improved I have reviewed the patients home medicines and have made adjustments as needed   Disposition: Patient presentation suspicious for rotator cuff pathology.  Doubt septic arthritis, no increased warmth to joint, patient afebrile. Doubt fracture or dislocation.  Doubt osteoarthritis, no radiographic findings.  Doubt strain.  After consideration of the diagnostic results and the patients response to treatment, I feel that the patient would benefit from Discharge home.  Arm sling provided to patient today.  Short course Percocet provided.  Discussed with patient importance of following up with orthopedist to schedule follow-up appointment regarding today's ED visit.  Patient and spouse agreeable at this time.  Strict return precautions provided to patient regarding fever,  increasing/worsening knee swelling, color change, or gait issue. Supportive care measures and strict return precautions discussed with patient at bedside. Pt acknowledges and verbalizes understanding. Pt appears safe for discharge. Follow up as indicated in discharge paperwork.    This chart was dictated using voice recognition software, Dragon. Despite the best efforts of this provider to proofread and correct errors, errors may still occur which can change documentation meaning.  Final Clinical Impression(s) / ED Diagnoses Final diagnoses:  Acute pain of right shoulder    Rx / DC Orders ED Discharge Orders     None         Takai Chiaramonte A, PA-C 07/14/21 1513    Lacretia Leigh, MD 07/16/21 1237

## 2021-07-14 NOTE — Discharge Instructions (Addendum)
It was a pleasure taking care of you!  ? ?Your x-ray was negative for fracture or dislocation.  You will be sent a short course of Percocet, this is to be used for breakthrough pain, throughout the day you may take over the counter 500 mg Tylenol every 6 hours as needed for pain for no more than 7 days.  Ensure that there is at least a 5-6-hour break between the Tylenol and Percocet.  You will be given a sling today, and sure to not wear it for more than 24 hours. You may apply ice or heat to affected area for up to 15 minutes at a time. Ensure to place a barrier between your skin and the ice/heat.  Ensure that you call your orthopedist and set up a follow-up appointment regarding today's ED visit.  You may follow-up with your primary care provider as needed.  Return to the Emergency Department if you are experiencing increasing/worsening pain, swelling, color change, fever, or worsening symptoms. ?

## 2021-07-17 ENCOUNTER — Other Ambulatory Visit: Payer: Self-pay | Admitting: Family Medicine

## 2021-08-01 ENCOUNTER — Encounter: Payer: Self-pay | Admitting: Endocrinology

## 2021-08-02 ENCOUNTER — Other Ambulatory Visit: Payer: Self-pay | Admitting: Endocrinology

## 2021-08-02 MED ORDER — OZEMPIC (2 MG/DOSE) 8 MG/3ML ~~LOC~~ SOPN: 2.0000 mg | PEN_INJECTOR | SUBCUTANEOUS | 3 refills | Status: DC

## 2021-08-08 ENCOUNTER — Encounter: Payer: Self-pay | Admitting: Adult Health

## 2021-08-08 ENCOUNTER — Ambulatory Visit (INDEPENDENT_AMBULATORY_CARE_PROVIDER_SITE_OTHER): Payer: Medicare Other | Admitting: Adult Health

## 2021-08-08 VITALS — BP 127/84 | HR 68 | Ht 71.0 in | Wt 258.0 lb

## 2021-08-08 DIAGNOSIS — G459 Transient cerebral ischemic attack, unspecified: Secondary | ICD-10-CM | POA: Diagnosis not present

## 2021-08-08 NOTE — Progress Notes (Signed)
?[facility] ?[address] ?[place]. [place] [address] ?[phone] ? ?     OFFICE FOLLOW-UP NOTE ? ?[name]. ?Date of Birth:  05-Feb-1966 ?Medical Record Number:  [id number]  ? ? ?Primary neurologist: Dr. Leonie [name] ?PCP: Xavier Sou, MD  ? ?Reason for visit: Stroke follow-up ? ? ?Chief Complaint  ?Patient presents with  ? Follow-up  ?  RM 3 with spouse Xavier [name]  ?Pt is well and stable, no new concerns   ?  ? ? ?HPI:  ? ? ?Initial visit 02/02/2021 Dr. Leonie [name]: Xavier [name] is a 56 year old Caucasian male seen today for office follow-up visit following recent hospital consultation for TIA.  History is obtained from the patient and his wife was accompanying him as well as review of electronic medical records and I personally reviewed pertinent available imaging films in PACS.Xavier [name]. is a 56 y.o. male with a pertinent history of DM, HLD, HTN, pulmonary embolism, DVT on xarelto, obesity, OSA on cpap, CAD s/p stents still on plavix, recent spinal surgery on 8/24 and has been on muscle relaxers and some narcotics, at home he developed some slurred speech a few days ago along with some jaw pain and locked jaw for a few hours.  Came to the ER where initial head CT was negative and he was admitted for further stroke/TIA work-up.  MRI scan of the brain was negative for acute infarct and was unremarkable.  MRA of the head and neck both did not show significant stenosis of intracranial extracranial vessels.  Patient was already on Plavix and Xarelto because of history of cardiac stents and DVT and pulmonary embolism.  2D echo showed left ventricular ejection fraction 60-65% with normal size left atrium.  Prolonged cardiac monitoring was not done since patient had an alternative reason for long-term anticoagulation and finding atrial fibrillation would not change management.  LDL cholesterol 47 mg percent and hemoglobin A1c was 6.6.  He was discharged home on his prior medication regimen of  Plavix and Xarelto.  Patient states is done well since discharge.  Tolerating both medications well without bruising or bleeding.  His blood pressure is under good control today it is 128/81.  He remains on Crestor and Tricor which is tolerating well without side effects.  He has had no recurrence of his jaw pain or locking of his jaw.  He has not yet made an appointment to see dentist for this.  He has no prior history of TIAs or stroke.  He has no new complaints. ? ?Update 08/08/2021 Xavier [name]: Patient returns for 55-monthfollow-up accompanied by his wife, LLattie [name]  Overall stable without new or reoccurring stroke/TIA symptoms.  Compliant on Plavix, Tricor and Crestor, denies side effects.  Also remains on Xarelto for history of DVT and PE, denies side effects.  Blood pressure today 127/84.  Monitors at home which has been stable. Recent A1c 6.8, followed by endocrinology, has been having difficulty obtaining diabetic medications due to national back orders, wife plans on contacting endocrinology today.  Also closely follows with PCP.  No new concerns at this time. ? ? ? ? ? ? ?ROS:   ?14 system review of systems is positive for those listed in HPI and all other systems negative ? ?PMH:  ?Past Medical History:  ?Diagnosis Date  ? ANEMIA, PERNICIOUS 04/03/2007  ? Anxiety and depression   ? Arthritis   ? "left hand" (06/26/2016)  ? Chronic lower back pain   ? grd I degen  spondylolisthesis at L4-5 w/associated synovial cyst at facet at this level +underlying severe epidural lipomatosis L5-S1 up to L4-5--plan for surg w/Dr. Rolena Infante 12/2020.  ? Chronic renal insufficiency, stage 3 (moderate) ([place]) 2022  ? GFR around 60  ? Colon cancer screening 10/27/2016  ? Cologuard NEG-->rpt 3 yrs  ? CORONARY ARTERY DISEASE 12/05/2006  ? DIABETES MELLITUS, TYPE II dx'd 2001  ? DVT (deep venous thrombosis) ([place]) 2016  ? BLE  ? GERD (gastroesophageal reflux disease)   ? History of balanitis   ? x 2. Holding off on circ as of 05/2020 urol f/u  ?  History of blood transfusion 2001  ? "when I had my hand OR"  ? History of gout   ? History of kidney stones   ? HYPERCHOLESTEROLEMIA 07/17/2007  ? HYPERTENSION 12/05/2006  ? Learning disability   ? Migraine   ? "1-2/month" (06/26/2016)  ? Myocardial infarction [facility]) ~ 2003  ? Obesity, Class II, BMI 35-39.9   ? OSA on CPAP   ? Pt cannot recall setting clearly but thinks it is 5 cm h20.    ? PE (pulmonary embolism) 2016  ? Peripheral vascular disease ([place])   ? Simple renal cyst   ? X 2 on R kidney (one 5 cm and one 2 cm)  ? Stroke [facility]) ~ 2006  ? denies residual on 06/26/2016  ? TIA (transient ischemic attack) ?06/25/2016  ? 2018 and 01/2021 (MRI brain, MR angio head and neck neg 2022)  ? VISUAL ACUITY, DECREASED, RIGHT EYE 02/19/2009  ? ? ?Social History:  ?Social History  ? ?Socioeconomic History  ? Marital status: Married  ?  Spouse name: Xavier [name]  ? Number of children: Not on file  ? Years of education: Not on file  ? Highest education level: Not on file  ?Occupational History  ? Occupation: Disabled  ?  Employer: DISABLED  ?Tobacco Use  ? Smoking status: Former  ?  Packs/day: 0.10  ?  Years: 3.00  ?  Pack years: 0.30  ?  Types: Cigarettes  ?  Start date: 55  ?  Quit date: 05/15/1984  ?  Years since quitting: 37.2  ? Smokeless tobacco: Never  ?Vaping Use  ? Vaping Use: Never used  ?Substance and Sexual Activity  ? Alcohol use: No  ? Drug use: No  ? Sexual activity: Not on file  ?Other Topics Concern  ? Not on file  ?Social History Narrative  ? Lives with wife and youngest son  ? Right Hand  ? Drinks no caffeine  ? ?Social Determinants of Health  ? ?Financial Resource Strain: Not on file  ?Food Insecurity: Not on file  ?Transportation Needs: Not on file  ?Physical Activity: Not on file  ?Stress: Not on file  ?Social Connections: Not on file  ?Intimate Partner Violence: Not on file  ? ? ?Medications:   ?Current Outpatient Medications on File Prior to Visit  ?Medication Sig Dispense Refill  ? Blood Glucose Monitoring Suppl  (ACCU-CHEK GUIDE ME) w/Device KIT USE TO CHECK BLOOD SUGAR 1 TIME PER DAY. DX CODE: E11.9 1 kit 0  ? bromocriptine (PARLODEL) 2.5 MG tablet Take 1.25 mg by mouth daily.    ? clopidogrel (PLAVIX) 75 MG tablet TAKE 1 TABLET BY MOUTH EVERY DAY 90 tablet 3  ? cyanocobalamin 1000 MCG tablet Take 1 tablet (1,000 mcg total) by mouth daily. 90 tablet 3  ? diazepam (VALIUM) 10 MG tablet in the morning and at bedtime.    ? DULoxetine (  CYMBALTA) 30 MG capsule Take 30 mg by mouth daily.    ? EPINEPHrine 0.3 mg/0.3 mL IJ SOAJ injection Inject 0.3 mg into the muscle as needed for anaphylaxis. 2 each 1  ? fenofibrate (TRICOR) 145 MG tablet Take 1 tablet (145 mg total) by mouth daily. 90 tablet 3  ? fluconazole (DIFLUCAN) 150 MG tablet TAKE 1 TABLET BY MOUTH EVERY 7 DAYS 4 tablet 1  ? FLUoxetine (PROZAC) 40 MG capsule Take 40 mg by mouth daily.    ? fluticasone (FLONASE) 50 MCG/ACT nasal spray USE ONE SPRAY IN EACH NOSTRIL TWICE A DAY 48 g 3  ? gabapentin (NEURONTIN) 300 MG capsule Take 300 mg by mouth 2 (two) times daily.    ? glucose blood (ACCU-CHEK GUIDE) test strip check your blood sugar once a day 100 each 12  ? hydrochlorothiazide (HYDRODIURIL) 12.5 MG tablet TAKE 1 TABLET BY MOUTH EVERY DAY WITH THE LOSARTAN 90 tablet 3  ? JARDIANCE 25 MG TABS tablet TAKE 1 TABLET BY MOUTH EVERY DAY 90 tablet 1  ? losartan (COZAAR) 100 MG tablet Take 0.5 tablets (50 mg total) by mouth daily. 45 tablet 0  ? metFORMIN (GLUCOPHAGE-XR) 500 MG 24 hr tablet TAKE 2 TABLETS BY MOUTH TWICE A DAY 360 tablet 0  ? methocarbamol (ROBAXIN) 500 MG tablet Take 500 mg by mouth 2 (two) times daily.    ? nitroGLYCERIN (NITROSTAT) 0.4 MG SL tablet Place 1 tablet (0.4 mg total) under the tongue every 5 (five) minutes as needed for chest pain. 25 tablet 3  ? nystatin cream (MYCOSTATIN) Apply 1 application topically 2 (two) times daily. 30 g 1  ? ondansetron (ZOFRAN) 4 MG tablet Take 1 tablet (4 mg total) by mouth every 8 (eight) hours as needed for nausea or  vomiting. 20 tablet 0  ? oxyCODONE-acetaminophen (PERCOCET/ROXICET) 5-325 MG tablet Take 1 tablet by mouth every 8 (eight) hours as needed for severe pain. 5 tablet 0  ? rivaroxaban (XARELTO) 20 MG TABS tablet TA

## 2021-08-08 NOTE — Patient Instructions (Addendum)
Continue current medications for secondary stroke prevention measures  ? ?Continue to follow up with PCP regarding cholesterol and blood pressure management and endocrinology for diabetic management ?Maintain strict control of hypertension with blood pressure goal below 130/90, diabetes with A1c goal<7.0 and cholesterol with LDL cholesterol (bad cholesterol) goal below 70 mg/dL.  ? ?Signs of a Stroke? Follow the BEFAST method:  ?Balance Watch for a sudden loss of balance, trouble with coordination or vertigo ?Eyes Is there a sudden loss of vision in one or both eyes? Or double vision?  ?Face: Ask the person to smile. Does one side of the face droop or is it numb?  ?Arms: Ask the person to raise both arms. Does one arm drift downward? Is there weakness or numbness of a leg? ?Speech: Ask the person to repeat a simple phrase. Does the speech sound slurred/strange? Is the person confused ? ?Time: If you observe any of these signs, call 911. ? ? ? ? ? ? ?Thank you for coming to see [place] at [facility]. I hope we have been able to provide you high quality care today. ? ?You may receive a patient satisfaction survey over the next few weeks. We would appreciate your feedback and comments so that we may continue to improve ourselves and the health of our patients. ?Continue ?

## 2021-08-12 ENCOUNTER — Other Ambulatory Visit: Payer: Self-pay | Admitting: Endocrinology

## 2021-08-16 ENCOUNTER — Telehealth: Payer: Self-pay | Admitting: Family Medicine

## 2021-08-16 NOTE — Telephone Encounter (Signed)
Pt said that his Endocrinologist Dr. Gregary Signs will no longer be with [facility] anymore starting May. ? ?Pt wanting to know will he need to someone else about his diabetes?  ? ?Pt cell: [phone] ? ?

## 2021-08-16 NOTE — Telephone Encounter (Signed)
Pt currently has scheduled appt with Dr.Ellison on 5/2 and PCP follow up 6/16. ? ? ?Please Advise ?

## 2021-08-17 NOTE — Telephone Encounter (Signed)
I can see him for the diabetes ?

## 2021-08-22 NOTE — Telephone Encounter (Signed)
ERROR

## 2021-09-13 ENCOUNTER — Ambulatory Visit (INDEPENDENT_AMBULATORY_CARE_PROVIDER_SITE_OTHER): Payer: Medicare Other | Admitting: Endocrinology

## 2021-09-13 VITALS — BP 118/78 | HR 71 | Ht 71.0 in | Wt 252.0 lb

## 2021-09-13 DIAGNOSIS — E1165 Type 2 diabetes mellitus with hyperglycemia: Secondary | ICD-10-CM | POA: Diagnosis not present

## 2021-09-13 DIAGNOSIS — E119 Type 2 diabetes mellitus without complications: Secondary | ICD-10-CM

## 2021-09-13 LAB — POCT GLYCOSYLATED HEMOGLOBIN (HGB A1C): Hemoglobin A1C: 7.6 % — AB (ref 4.0–5.6)

## 2021-09-13 MED ORDER — ACCU-CHEK GUIDE VI STRP: 1.0000 | ORAL_STRIP | Freq: Every day | 1 refills | Status: DC

## 2021-09-13 MED ORDER — REPAGLINIDE 0.5 MG PO TABS: 0.5000 mg | ORAL_TABLET | Freq: Two times a day (BID) | ORAL | 1 refills | Status: DC

## 2021-09-13 NOTE — Patient Instructions (Addendum)
I have sent a prescription to your pharmacy, to add repaglinide.   ?Please continue the same other diabetes medications.   ?check your blood sugar once a day.  vary the time of day when you check, between before the 3 meals, and at bedtime.  also check if you have symptoms of your blood sugar being too high or too low.  please keep a record of the readings and bring it to your next appointment here (or you can bring the meter itself).  You can write it on any piece of paper.  please call us sooner if your blood sugar goes below 70, or if you have a lot of readings over 200.    ?You should have an endocrinology follow-up appointment in 3-4 months.   ?

## 2021-09-13 NOTE — Progress Notes (Signed)
? ?Subjective:  ? ? Patient ID: Xavier [name]., male    DOB: June 03, 1965, 56 y.o.   MRN: [id number] ? ?HPI ?Pt returns for f/u of diabetes mellitus:  ?DM type: 2 ?Dx'ed: 2005 ?Complications: CAD, TIA, CRI, and PN.   ?Therapy: Ozempic and 3 oral meds.   ?DKA: never ?Severe hypoglycemia: never.   ?Pancreatitis: never.   ?Other: he did not tolerate pioglitizone (edema); he has never taken insulin, except in the hospital.   ?Interval history: no cbg record, but states cbg's are well-controlled.  pt states he feels well in general.  He takes meds as rx'ed.  No change in chronic HB.    ?Past Medical History:  ?Diagnosis Date  ? ANEMIA, PERNICIOUS 04/03/2007  ? Anxiety and depression   ? Arthritis   ? "left hand" (06/26/2016)  ? Chronic lower back pain   ? grd I degen spondylolisthesis at L4-5 w/associated synovial cyst at facet at this level +underlying severe epidural lipomatosis L5-S1 up to L4-5--plan for surg w/Dr. Rolena Infante 12/2020.  ? Chronic renal insufficiency, stage 3 (moderate) ([place]) 2022  ? GFR around 60  ? Colon cancer screening 10/27/2016  ? Cologuard NEG-->rpt 3 yrs  ? CORONARY ARTERY DISEASE 12/05/2006  ? DIABETES MELLITUS, TYPE II dx'd 2001  ? DVT (deep venous thrombosis) ([place]) 2016  ? BLE  ? GERD (gastroesophageal reflux disease)   ? History of balanitis   ? x 2. Holding off on circ as of 05/2020 urol f/u  ? History of blood transfusion 2001  ? "when I had my hand OR"  ? History of gout   ? History of kidney stones   ? HYPERCHOLESTEROLEMIA 07/17/2007  ? HYPERTENSION 12/05/2006  ? Learning disability   ? Migraine   ? "1-2/month" (06/26/2016)  ? Myocardial infarction [facility]) ~ 2003  ? Obesity, Class II, BMI 35-39.9   ? OSA on CPAP   ? Pt cannot recall setting clearly but thinks it is 5 cm h20.    ? PE (pulmonary embolism) 2016  ? Peripheral vascular disease ([place])   ? Simple renal cyst   ? X 2 on R kidney (one 5 cm and one 2 cm)  ? Stroke [facility]) ~ 2006  ? denies residual on 06/26/2016  ? TIA (transient ischemic attack)  ?06/25/2016  ? 2018 and 01/2021 (MRI brain, MR angio head and neck neg 2022)  ? VISUAL ACUITY, DECREASED, RIGHT EYE 02/19/2009  ? ? ?Past Surgical History:  ?Procedure Laterality Date  ? CARDIAC CATHETERIZATION  1990s; 2012; 06/2016  ? 06/2016 distal LAD DES stent.  EF 55-65%  ? CARDIOVASCULAR STRESS TEST  06/2016  ? Echo stress test->"inconclusive"-->cath was done after this  ? CATARACT EXTRACTION W/ INTRAOCULAR LENS IMPLANT Right 2017  ? CORONARY STENT INTERVENTION N/A 06/27/2016  ? Procedure: Coronary Stent Intervention;  Surgeon: Leonie Man, MD;  Location: [place] [facility];  Service: Cardiovascular;  Laterality: N/A;  ? EYE SURGERY Right   ? Cataract  ? INGUINAL HERNIA REPAIR Bilateral 1990s  ? INTRAVASCULAR PRESSURE WIRE/FFR STUDY N/A 06/27/2016  ? Procedure: Intravascular Pressure Wire/FFR Study;  Surgeon: Leonie Man, MD;  Location: [place] [facility];  Service: Cardiovascular;  Laterality: N/A;  ? LEFT HEART CATH AND CORONARY ANGIOGRAPHY N/A 06/27/2016  ? Procedure: Left Heart Cath and Coronary Angiography;  Surgeon: Leonie Man, MD;  Location: [place] [facility];  Service: Cardiovascular;  Laterality: N/A;  ? LUMBAR LAMINECTOMY/DECOMPRESSION MICRODISCECTOMY Left 01/05/2021  ? Procedure: Left L4-5  Gill Decompression;  Surgeon: Melina Schools, MD;  Location: [place];  Service: Orthopedics;  Laterality: Left;  ? REATTACHMENT HAND Left ~ 2001  ? w/carpal tunnel release  ? UMBILICAL HERNIA REPAIR  1990s  ? w/[facility]  ? ? ?Social History  ? ?Socioeconomic History  ? Marital status: Married  ?  Spouse name: Lattie Haw  ? Number of children: Not on file  ? Years of education: Not on file  ? Highest education level: Not on file  ?Occupational History  ? Occupation: Disabled  ?  Employer: DISABLED  ?Tobacco Use  ? Smoking status: Former  ?  Packs/day: 0.10  ?  Years: 3.00  ?  Pack years: 0.30  ?  Types: Cigarettes  ?  Start date: 36  ?  Quit date: 05/15/1984  ?  Years since quitting: 37.3  ? Smokeless tobacco: Never   ?Vaping Use  ? Vaping Use: Never used  ?Substance and Sexual Activity  ? Alcohol use: No  ? Drug use: No  ? Sexual activity: Not on file  ?Other Topics Concern  ? Not on file  ?Social History Narrative  ? Lives with wife and youngest son  ? Right Hand  ? Drinks no caffeine  ? ?Social Determinants of Health  ? ?Financial Resource Strain: Not on file  ?Food Insecurity: Not on file  ?Transportation Needs: Not on file  ?Physical Activity: Not on file  ?Stress: Not on file  ?Social Connections: Not on file  ?Intimate Partner Violence: Not on file  ? ? ?Current Outpatient Medications on File Prior to Visit  ?Medication Sig Dispense Refill  ? bromocriptine (PARLODEL) 2.5 MG tablet Take 1.25 mg by mouth daily.    ? clopidogrel (PLAVIX) 75 MG tablet TAKE 1 TABLET BY MOUTH EVERY DAY 90 tablet 3  ? cyanocobalamin 1000 MCG tablet Take 1 tablet (1,000 mcg total) by mouth daily. 90 tablet 3  ? diazepam (VALIUM) 10 MG tablet in the morning and at bedtime.    ? DULoxetine (CYMBALTA) 30 MG capsule Take 30 mg by mouth daily.    ? EPINEPHrine 0.3 mg/0.3 mL IJ SOAJ injection Inject 0.3 mg into the muscle as needed for anaphylaxis. 2 each 1  ? fenofibrate (TRICOR) 145 MG tablet Take 1 tablet (145 mg total) by mouth daily. 90 tablet 3  ? fluconazole (DIFLUCAN) 150 MG tablet TAKE 1 TABLET BY MOUTH EVERY 7 DAYS 4 tablet 1  ? FLUoxetine (PROZAC) 40 MG capsule Take 40 mg by mouth daily.    ? fluticasone (FLONASE) 50 MCG/ACT nasal spray USE ONE SPRAY IN EACH NOSTRIL TWICE A DAY 48 g 3  ? gabapentin (NEURONTIN) 300 MG capsule Take 300 mg by mouth 2 (two) times daily.    ? hydrochlorothiazide (HYDRODIURIL) 12.5 MG tablet TAKE 1 TABLET BY MOUTH EVERY DAY WITH THE LOSARTAN 90 tablet 3  ? JARDIANCE 25 MG TABS tablet TAKE 1 TABLET BY MOUTH EVERY DAY 90 tablet 1  ? losartan (COZAAR) 100 MG tablet Take 0.5 tablets (50 mg total) by mouth daily. 45 tablet 0  ? metFORMIN (GLUCOPHAGE-XR) 500 MG 24 hr tablet TAKE 2 TABLETS BY MOUTH TWICE A DAY 360 tablet  0  ? methocarbamol (ROBAXIN) 500 MG tablet Take 500 mg by mouth 2 (two) times daily.    ? nitroGLYCERIN (NITROSTAT) 0.4 MG SL tablet Place 1 tablet (0.4 mg total) under the tongue every 5 (five) minutes as needed for chest pain. 25 tablet 3  ? nystatin cream (MYCOSTATIN) Apply 1 application  topically 2 (two) times daily. 30 g 1  ? ondansetron (ZOFRAN) 4 MG tablet Take 1 tablet (4 mg total) by mouth every 8 (eight) hours as needed for nausea or vomiting. 20 tablet 0  ? oxyCODONE-acetaminophen (PERCOCET/ROXICET) 5-325 MG tablet Take 1 tablet by mouth every 8 (eight) hours as needed for severe pain. 5 tablet 0  ? rivaroxaban (XARELTO) 20 MG TABS tablet TAKE 1 TABLET BY MOUTH EVERY DAY BEFORE SUPPER 90 tablet 3  ? rosuvastatin (CRESTOR) 20 MG tablet Take 1 tablet (20 mg total) by mouth daily. 90 tablet 3  ? Semaglutide, 2 MG/DOSE, (OZEMPIC, 2 MG/DOSE,) 8 MG/3ML SOPN Inject 2 mg into the skin once a week. 9 mL 3  ? ?No current facility-administered medications on file prior to visit.  ? ? ? ? ?Family History  ?Problem Relation Age of Onset  ? COPD Mother   ? Lymphoma Father   ? ? ?BP 118/78 (BP Location: Left Arm, Patient Position: Sitting, Cuff Size: Normal)   Pulse 71   Ht 5\' 11"  (1.803 m)   Wt 252 lb (114.3 kg)   SpO2 97%   BMI 35.15 kg/m?  ? ? ?Review of Systems ?Denies N/V.   ?   ?Objective:  ? Physical Exam ?VITAL SIGNS:  See vs page.   ?GENERAL: no distress.   ?Pulses: dorsalis pedis intact bilat.   ?MSK: no deformity of the feet ?CV: no leg edema ?Skin:  no ulcer on the feet.  normal color and temp on the feet. ?Neuro: sensation is intact to touch on the feet ? ? ?Lab Results  ?Component Value Date  ? HGBA1C 7.6 (A) 09/13/2021  ? ?   ?Assessment & Plan:  ?Type 2 DM: uncontrolled ? ? ?Patient Instructions  ?I have sent a prescription to your pharmacy, to add repaglinide.   ?Please continue the same other diabetes medications.   ?check your blood sugar once a day.  vary the time of day when you check, between  before the 3 meals, and at bedtime.  also check if you have symptoms of your blood sugar being too high or too low.  please keep a record of the readings and bring it to your next appointment here (or you can

## 2021-09-15 ENCOUNTER — Telehealth: Payer: Self-pay

## 2021-09-15 NOTE — Telephone Encounter (Signed)
Patient LVM to inform us that he was prescribed Repaglinide during last OV and is unable to take the medication due to the interference with his blood thinner and hearts medications.  ?

## 2021-09-15 NOTE — Telephone Encounter (Signed)
Attempted to contact the patient to inform him that its okay to d/c Repaglinide. Patient mailbox is currently full.  ?

## 2021-09-16 DIAGNOSIS — Z20822 Contact with and (suspected) exposure to covid-19: Secondary | ICD-10-CM | POA: Diagnosis not present

## 2021-10-04 ENCOUNTER — Other Ambulatory Visit: Payer: Self-pay

## 2021-10-04 MED ORDER — LOSARTAN POTASSIUM 100 MG PO TABS: 50.0000 mg | ORAL_TABLET | Freq: Every day | ORAL | 0 refills | Status: DC

## 2021-10-28 ENCOUNTER — Ambulatory Visit (INDEPENDENT_AMBULATORY_CARE_PROVIDER_SITE_OTHER): Payer: Medicare Other | Admitting: Family Medicine

## 2021-10-28 ENCOUNTER — Encounter: Payer: Self-pay | Admitting: Family Medicine

## 2021-10-28 VITALS — BP 94/64 | HR 69 | Temp 98.0°F | Ht 71.0 in | Wt 247.6 lb

## 2021-10-28 DIAGNOSIS — E1121 Type 2 diabetes mellitus with diabetic nephropathy: Secondary | ICD-10-CM | POA: Diagnosis not present

## 2021-10-28 DIAGNOSIS — I10 Essential (primary) hypertension: Secondary | ICD-10-CM

## 2021-10-28 DIAGNOSIS — N1831 Chronic kidney disease, stage 3a: Secondary | ICD-10-CM

## 2021-10-28 DIAGNOSIS — E7800 Pure hypercholesterolemia, unspecified: Secondary | ICD-10-CM

## 2021-10-28 LAB — COMPREHENSIVE METABOLIC PANEL
ALT: 25 U/L (ref 0–53)
AST: 18 U/L (ref 0–37)
Albumin: 4.4 g/dL (ref 3.5–5.2)
Alkaline Phosphatase: 35 U/L — ABNORMAL LOW (ref 39–117)
BUN: 15 mg/dL (ref 6–23)
CO2: 28 mEq/L (ref 19–32)
Calcium: 10.5 mg/dL (ref 8.4–10.5)
Chloride: 101 mEq/L (ref 96–112)
Creatinine, Ser: 1.22 mg/dL (ref 0.40–1.50)
GFR: 66.82 mL/min (ref >60.00)
Glucose, Bld: 128 mg/dL — ABNORMAL HIGH (ref 70–99)
Potassium: 4.9 mEq/L (ref 3.5–5.1)
Sodium: 139 mEq/L (ref 135–145)
Total Bilirubin: 0.4 mg/dL (ref 0.2–1.2)
Total Protein: 7 g/dL (ref 6.0–8.3)

## 2021-10-28 LAB — MICROALBUMIN / CREATININE URINE RATIO
Creatinine,U: 67.4 mg/dL
Microalb Creat Ratio: 1 mg/g (ref 0.0–30.0)
Microalb, Ur: <0.7 mg/dL (ref 0.0–1.9)

## 2021-10-28 LAB — LIPID PANEL
Cholesterol: 131 mg/dL (ref 0–200)
HDL: 56.8 mg/dL (ref >39.00)
LDL Cholesterol: 45 mg/dL (ref 0–99)
NonHDL: 74.49
Total CHOL/HDL Ratio: 2
Triglycerides: 147 mg/dL (ref 0.0–149.0)
VLDL: 29.4 mg/dL (ref 0.0–40.0)

## 2021-10-28 MED ORDER — METFORMIN HCL ER 500 MG PO TB24: 1000.0000 mg | ORAL_TABLET | Freq: Two times a day (BID) | ORAL | 1 refills | Status: DC

## 2021-10-28 MED ORDER — GABAPENTIN 300 MG PO CAPS: 300.0000 mg | ORAL_CAPSULE | Freq: Two times a day (BID) | ORAL | 1 refills | Status: DC

## 2021-10-28 NOTE — Patient Instructions (Signed)
Contact [facility] to schedule an appointment [address] [place], [place] [address] [phone]

## 2021-10-28 NOTE — Progress Notes (Signed)
OFFICE VISIT  10/28/2021  CC:  Chief Complaint  Patient presents with   Diabetes   Hypertension   Hyperlipidemia    Pt is fasting   HPI:    Patient is a 56 y.o. male who presents accompanied by his wife for 6 mo f/u DM, HTN, CRI II/III, and HLD. I last saw him 05/18/2021. A/P as of that visit: "#1 hemoptysis, isolated episode about 10 days ago. No other symptoms with this at all. Reassuringly, he had a CT angiogram about 3 to 4 months ago that was normal. He is on Plavix and Xarelto. Overall I think we can just watch this, no further work-up at this time.  Continue all current medications."  INTERIM HX: His endocrinologist has retired. He asks that I now manage his diabetes. He does not check his glucoses regularly but when he feels bad he does check it and says it does run typically a little high.  No specific numbers provided today. The most recent medication prescribed by his endocrinologist was Prandin.  Package insert says caution with combining this with Plavix so patient decided not to take it. He takes Ozempic 2 mg subcu weekly, metformin XR 1000 twice daily, and Jardiance 25 mg a day. No groin rash or UTI symptoms. He takes gabapentin 300 mg twice daily for neuropathic pain in legs/ feet Blood pressure is normal.  ROS as above, plus--> no fevers, no CP, no SOB, no wheezing, no cough, no dizziness, no HAs, no rashes, no melena/hematochezia.  No polyuria or polydipsia.  No myalgias or arthralgias.  No focal weakness, paresthesias, or tremors.  No acute vision or hearing abnormalities.  No dysuria or unusual/new urinary urgency or frequency.  No recent changes in lower legs. No n/v/d or abd pain.  No palpitations.     Past Medical History:  Diagnosis Date   ANEMIA, PERNICIOUS 04/03/2007   Anxiety and depression    Arthritis    "left hand" (06/26/2016)   Chronic lower back pain    grd I degen spondylolisthesis at L4-5 w/associated synovial cyst at facet at this level  +underlying severe epidural lipomatosis L5-S1 up to L4-5--plan for surg w/Dr. Shon Baton 12/2020.   Chronic renal insufficiency, stage 3 (moderate) (HCC) 2022   GFR around 60   Colon cancer screening 10/27/2016   Cologuard NEG-->rpt 3 yrs   CORONARY ARTERY DISEASE 12/05/2006   DIABETES MELLITUS, TYPE II dx'd 2001   DVT (deep venous thrombosis) (HCC) 2016   BLE   GERD (gastroesophageal reflux disease)    History of balanitis    x 2. Holding off on circ as of 05/2020 urol f/u   History of blood transfusion 2001   "when I had my hand OR"   History of gout    History of kidney stones    HYPERCHOLESTEROLEMIA 07/17/2007   HYPERTENSION 12/05/2006   Learning disability    Migraine    "1-2/month" (06/26/2016)   Myocardial infarction (HCC) ~ 2003   Obesity, Class II, BMI 35-39.9    OSA on CPAP    Pt cannot recall setting clearly but thinks it is 5 cm h20.     PE (pulmonary embolism) 2016   Peripheral vascular disease (HCC)    Simple renal cyst    X 2 on R kidney (one 5 cm and one 2 cm)   Stroke (HCC) ~ 2006   denies residual on 06/26/2016   TIA (transient ischemic attack) ?06/25/2016   2018 and 01/2021 (MRI brain, MR angio head and  neck neg 2022)   VISUAL ACUITY, DECREASED, RIGHT EYE 02/19/2009    Past Surgical History:  Procedure Laterality Date   CARDIAC CATHETERIZATION  1990s; 2012; 06/2016   06/2016 distal LAD DES stent.  EF 55-65%   CARDIOVASCULAR STRESS TEST  06/2016   Echo stress test->"inconclusive"-->cath was done after this   CATARACT EXTRACTION W/ INTRAOCULAR LENS IMPLANT Right 2017   CORONARY STENT INTERVENTION N/A 06/27/2016   Procedure: Coronary Stent Intervention;  Surgeon: Marykay Lex, MD;  Location: [facility] INVASIVE CV LAB;  Service: Cardiovascular;  Laterality: N/A;   EYE SURGERY Right    Cataract   INGUINAL HERNIA REPAIR Bilateral 1990s   INTRAVASCULAR PRESSURE WIRE/FFR STUDY N/A 06/27/2016   Procedure: Intravascular Pressure Wire/FFR Study;  Surgeon: Marykay Lex, MD;   Location: [facility] INVASIVE CV LAB;  Service: Cardiovascular;  Laterality: N/A;   LEFT HEART CATH AND CORONARY ANGIOGRAPHY N/A 06/27/2016   Procedure: Left Heart Cath and Coronary Angiography;  Surgeon: Marykay Lex, MD;  Location: [facility] INVASIVE CV LAB;  Service: Cardiovascular;  Laterality: N/A;   LUMBAR LAMINECTOMY/DECOMPRESSION MICRODISCECTOMY Left 01/05/2021   Procedure: Left L4-5 Gill Decompression;  Surgeon: Venita Lick, MD;  Location: [facility] OR;  Service: Orthopedics;  Laterality: Left;   REATTACHMENT HAND Left ~ 2001   w/carpal tunnel release   UMBILICAL HERNIA REPAIR  1990s   w/IHR    Outpatient Medications Prior to Visit  Medication Sig Dispense Refill   Accu-Chek Softclix Lancets lancets daily.     bromocriptine (PARLODEL) 2.5 MG tablet Take 1.25 mg by mouth daily.     clopidogrel (PLAVIX) 75 MG tablet TAKE 1 TABLET BY MOUTH EVERY DAY 90 tablet 3   cyanocobalamin 1000 MCG tablet Take 1 tablet (1,000 mcg total) by mouth daily. 90 tablet 3   diazepam (VALIUM) 10 MG tablet in the morning and at bedtime.     DULoxetine (CYMBALTA) 30 MG capsule Take 30 mg by mouth daily.     fenofibrate (TRICOR) 145 MG tablet Take 1 tablet (145 mg total) by mouth daily. 90 tablet 3   FLUoxetine (PROZAC) 40 MG capsule Take 40 mg by mouth daily.     fluticasone (FLONASE) 50 MCG/ACT nasal spray USE ONE SPRAY IN EACH NOSTRIL TWICE A DAY 48 g 3   glucose blood (ACCU-CHEK GUIDE) test strip 1 each by Other route daily. And lancets 1/day 100 each 1   hydrochlorothiazide (HYDRODIURIL) 12.5 MG tablet TAKE 1 TABLET BY MOUTH EVERY DAY WITH THE LOSARTAN 90 tablet 3   JARDIANCE 25 MG TABS tablet TAKE 1 TABLET BY MOUTH EVERY DAY 90 tablet 1   losartan (COZAAR) 100 MG tablet Take 0.5 tablets (50 mg total) by mouth daily. 45 tablet 0   methocarbamol (ROBAXIN) 500 MG tablet Take 500 mg by mouth 2 (two) times daily.     nystatin cream (MYCOSTATIN) Apply 1 application topically 2 (two) times daily. 30 g 1   rivaroxaban  (XARELTO) 20 MG TABS tablet TAKE 1 TABLET BY MOUTH EVERY DAY BEFORE SUPPER 90 tablet 3   rosuvastatin (CRESTOR) 20 MG tablet Take 1 tablet (20 mg total) by mouth daily. 90 tablet 3   Semaglutide, 2 MG/DOSE, (OZEMPIC, 2 MG/DOSE,) 8 MG/3ML SOPN Inject 2 mg into the skin once a week. 9 mL 3   gabapentin (NEURONTIN) 300 MG capsule Take 300 mg by mouth 2 (two) times daily.     metFORMIN (GLUCOPHAGE-XR) 500 MG 24 hr tablet TAKE 2 TABLETS BY MOUTH TWICE A DAY 360 tablet  0   EPINEPHrine 0.3 mg/0.3 mL IJ SOAJ injection Inject 0.3 mg into the muscle as needed for anaphylaxis. (Patient not taking: Reported on 10/28/2021) 2 each 1   fluconazole (DIFLUCAN) 150 MG tablet TAKE 1 TABLET BY MOUTH EVERY 7 DAYS (Patient not taking: Reported on 10/28/2021) 4 tablet 1   nitroGLYCERIN (NITROSTAT) 0.4 MG SL tablet Place 1 tablet (0.4 mg total) under the tongue every 5 (five) minutes as needed for chest pain. (Patient not taking: Reported on 10/28/2021) 25 tablet 3   ondansetron (ZOFRAN) 4 MG tablet Take 1 tablet (4 mg total) by mouth every 8 (eight) hours as needed for nausea or vomiting. (Patient not taking: Reported on 10/28/2021) 20 tablet 0   oxyCODONE-acetaminophen (PERCOCET/ROXICET) 5-325 MG tablet Take 1 tablet by mouth every 8 (eight) hours as needed for severe pain. (Patient not taking: Reported on 10/28/2021) 5 tablet 0   repaglinide (PRANDIN) 0.5 MG tablet Take 1 tablet (0.5 mg total) by mouth 2 (two) times daily before a meal. (Patient not taking: Reported on 10/28/2021) 180 tablet 1   No facility-administered medications prior to visit.    Allergies  Allergen Reactions   Actos [Pioglitazone] Other (See Comments)    Potential cause of DVT   Bee Venom Anaphylaxis   Klonopin [Clonazepam] Anaphylaxis   Invokana [Canagliflozin] Nausea Only    Nausea/dizzy/bad taste in mouth   Niacin Other (See Comments)    "makes his crazy"    ROS As per HPI  PE:    10/28/2021   10:52 AM 09/13/2021    9:14 AM 08/08/2021     9:31 AM  Vitals with BMI  Height 5\' 11"  5\' 11"  5\' 11"   Weight 247 lbs 10 oz 252 lbs 258 lbs  BMI 0000000 [id number]  Systolic 94 [id number]  Diastolic 64 78 84  Pulse 69 71 68     Physical Exam  Gen: Alert, well appearing.  Patient is oriented to person, place, time, and situation. AFFECT: pleasant, lucid thought and speech. CV: RRR, no m/r/g.   LUNGS: CTA bilat, nonlabored resps, good aeration in all lung fields. EXT: no clubbing or cyanosis.  no edema.    LABS:  Last CBC Lab Results  Component Value Date   WBC 7.5 01/26/2021   HGB 13.6 01/26/2021   HCT 39.5 01/26/2021   MCV 90.4 01/26/2021   MCH 31.1 01/26/2021   RDW 13.1 01/26/2021   PLT 465 (H) [id number]   Last metabolic panel Lab Results  Component Value Date   GLUCOSE 128 (H) 04/29/2021   NA 139 04/29/2021   K 4.4 04/29/2021   CL 101 04/29/2021   CO2 27 04/29/2021   BUN 15 04/29/2021   CREATININE 1.24 04/29/2021   GFRNONAA >60 01/26/2021   CALCIUM 10.4 04/29/2021   PHOS 4.0 06/23/2015   PROT 7.6 04/29/2021   ALBUMIN 4.4 04/29/2021   BILITOT 0.6 04/29/2021   ALKPHOS 48 04/29/2021   AST 22 04/29/2021   ALT 33 04/29/2021   ANIONGAP 9 01/26/2021   Last lipids Lab Results  Component Value Date   CHOL 131 04/29/2021   HDL 58.60 04/29/2021   LDLCALC 47 01/26/2021   LDLDIRECT 58.0 04/29/2021   TRIG 202.0 (H) 04/29/2021   CHOLHDL 2 04/29/2021   Last hemoglobin A1c Lab Results  Component Value Date   HGBA1C 7.6 (A) 09/13/2021   Last thyroid functions Lab Results  Component Value Date   TSH 1.51 03/15/2021   Last vitamin B12 and Folate Lab Results  Component Value Date   VITAMINB12 446 04/29/2021   FOLATE 13.6 06/12/2008   IMPRESSION AND PLAN:  #1 type 2 diabetes with peripheral neuropathy. His endocrinologist retired.  I will assume responsibility for management of his diabetes now. Urine microalbumin/creatinine today. No changes today.  Plan repeat A1c in 2 months and if still greater than 7%  then will add insulin He is making some dietary changes.  He is relatively active in his yard and home.  #2 hypertension, well controlled on hydrochlorothiazide 12.5 mg a day and losartan 50 mg a day. Electrolytes and creatinine today.  3.  Hyperlipidemia, mixed. Doing well on rosuvastatin 20 mg a day and fenofibrate 145 mg a day. Lipids and hepatic panel today  #4 chronic renal insufficiency stage II, GFR low 60s. Electrolytes and creatinine today.  An After Visit Summary was printed and given to the patient.  FOLLOW UP: Return in about 2 months (around 12/28/2021) for routine chronic illness f/u. Next cpe 04/2022  Signed:  Santiago Bumpers, MD           10/28/2021

## 2021-11-22 ENCOUNTER — Telehealth: Payer: Self-pay

## 2021-11-22 NOTE — Telephone Encounter (Signed)
Pt's wife called stating that pt is needing PCP to either take over cymbalta, valium, and prozac or place rerferral for pt to get new provider that can control these medications. Pt is currently with a provider that they are having to pay OOP and no longer can afford it. Please advise.

## 2021-11-22 NOTE — Telephone Encounter (Signed)
Pt scheduled  

## 2021-11-22 NOTE — Telephone Encounter (Signed)
I can most likely take over responsibility for managing these medications but would like to see him sometime in the next 2 to 3 weeks to discuss.  Does he need short-term prescriptions for these medications in the meantime?

## 2021-11-24 ENCOUNTER — Encounter: Payer: Self-pay | Admitting: Family Medicine

## 2021-11-24 ENCOUNTER — Ambulatory Visit (INDEPENDENT_AMBULATORY_CARE_PROVIDER_SITE_OTHER): Payer: Medicare Other | Admitting: Family Medicine

## 2021-11-24 VITALS — BP 91/64 | HR 72 | Temp 97.9°F | Ht 71.0 in | Wt 246.6 lb

## 2021-11-24 DIAGNOSIS — F3341 Major depressive disorder, recurrent, in partial remission: Secondary | ICD-10-CM

## 2021-11-24 DIAGNOSIS — F411 Generalized anxiety disorder: Secondary | ICD-10-CM | POA: Diagnosis not present

## 2021-11-24 DIAGNOSIS — Z79899 Other long term (current) drug therapy: Secondary | ICD-10-CM | POA: Diagnosis not present

## 2021-11-24 MED ORDER — DULOXETINE HCL 30 MG PO CPEP: 30.0000 mg | ORAL_CAPSULE | Freq: Every day | ORAL | 1 refills | Status: DC

## 2021-11-24 MED ORDER — FLUOXETINE HCL 40 MG PO CAPS: 40.0000 mg | ORAL_CAPSULE | Freq: Every day | ORAL | 1 refills | Status: DC

## 2021-11-24 MED ORDER — DIAZEPAM 10 MG PO TABS: 10.0000 mg | ORAL_TABLET | Freq: Two times a day (BID) | ORAL | 1 refills | Status: DC
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
## 2021-12-20 ENCOUNTER — Other Ambulatory Visit: Payer: Self-pay

## 2021-12-20 MED ORDER — ACCU-CHEK SOFTCLIX LANCETS MISC: 2 refills | Status: AC

## 2021-12-22 ENCOUNTER — Telehealth: Payer: Self-pay

## 2021-12-22 DIAGNOSIS — E119 Type 2 diabetes mellitus without complications: Secondary | ICD-10-CM

## 2021-12-22 MED ORDER — ACCU-CHEK GUIDE VI STRP: 1.0000 | ORAL_STRIP | Freq: Every day | 2 refills | Status: DC

## 2021-12-22 NOTE — Telephone Encounter (Signed)
Per patient spouse  patient pcp has taken over his care for diabetes.

## 2021-12-22 NOTE — Telephone Encounter (Signed)
Patient refill request.  Dr. Milinda Cave is now managing all prescriptions [facility] [place]  Accu-Chek Softclix Lancets lancets [[id number]]   glucose blood (ACCU-CHEK GUIDE) test strip [[id number]]

## 2021-12-22 NOTE — Telephone Encounter (Signed)
Dr.Kumar refilled lancets on 12/20/21 (100,2). Refill for test strips sent. Pt's wife, Misty Stanley [facility]) made aware.

## 2021-12-28 ENCOUNTER — Ambulatory Visit: Payer: Medicare Other | Admitting: Family Medicine

## 2022-01-02 ENCOUNTER — Other Ambulatory Visit: Payer: Self-pay

## 2022-01-02 MED ORDER — LOSARTAN POTASSIUM 100 MG PO TABS: 50.0000 mg | ORAL_TABLET | Freq: Every day | ORAL | 0 refills | Status: DC

## 2022-01-12 ENCOUNTER — Ambulatory Visit (INDEPENDENT_AMBULATORY_CARE_PROVIDER_SITE_OTHER): Payer: Medicare Other | Admitting: Family Medicine

## 2022-01-12 ENCOUNTER — Encounter: Payer: Self-pay | Admitting: Family Medicine

## 2022-01-12 VITALS — BP 102/71 | HR 69 | Temp 97.5°F | Ht 71.0 in | Wt 241.2 lb

## 2022-01-12 DIAGNOSIS — N182 Chronic kidney disease, stage 2 (mild): Secondary | ICD-10-CM

## 2022-01-12 DIAGNOSIS — I10 Essential (primary) hypertension: Secondary | ICD-10-CM

## 2022-01-12 DIAGNOSIS — F419 Anxiety disorder, unspecified: Secondary | ICD-10-CM | POA: Diagnosis not present

## 2022-01-12 DIAGNOSIS — Z79899 Other long term (current) drug therapy: Secondary | ICD-10-CM

## 2022-01-12 DIAGNOSIS — E119 Type 2 diabetes mellitus without complications: Secondary | ICD-10-CM | POA: Diagnosis not present

## 2022-01-12 LAB — POCT GLYCOSYLATED HEMOGLOBIN (HGB A1C)
HbA1c POC (<> result, manual entry): 6.9 % (ref 4.0–5.6)
HbA1c, POC (controlled diabetic range): 6.9 % (ref 0.0–7.0)
HbA1c, POC (prediabetic range): 6.9 % — AB (ref 5.7–6.4)
Hemoglobin A1C: 6.9 % — AB (ref 4.0–5.6)

## 2022-01-12 LAB — BASIC METABOLIC PANEL
BUN: 21 mg/dL (ref 6–23)
CO2: 29 mEq/L (ref 19–32)
Calcium: 10.2 mg/dL (ref 8.4–10.5)
Chloride: 98 mEq/L (ref 96–112)
Creatinine, Ser: 1.34 mg/dL (ref 0.40–1.50)
GFR: 59.62 mL/min — ABNORMAL LOW (ref >60.00)
Glucose, Bld: 132 mg/dL — ABNORMAL HIGH (ref 70–99)
Potassium: 4.4 mEq/L (ref 3.5–5.1)
Sodium: 137 mEq/L (ref 135–145)

## 2022-01-12 MED ORDER — EPINEPHRINE 0.3 MG/0.3ML IJ SOAJ
0.3000 mg | INTRAMUSCULAR | 1 refills | Status: DC | PRN
Start: 2022-01-12 — End: 2023-12-14

## 2022-01-12 MED ORDER — LOSARTAN POTASSIUM 100 MG PO TABS: 50.0000 mg | ORAL_TABLET | Freq: Every day | ORAL | 3 refills | Status: DC

## 2022-01-12 NOTE — Progress Notes (Signed)
OFFICE VISIT  01/12/2022  CC:  Chief Complaint  Patient presents with   Diabetes   Hypertension   Patient is a 56 y.o. male who presents for 28-month follow-up diabetes, hypertension, and chronic renal insufficiency. A/P as of last visit: "#1 type 2 diabetes with peripheral neuropathy. His endocrinologist retired.  I will assume responsibility for management of his diabetes now. Urine microalbumin/creatinine today. No changes today.  Plan repeat A1c in 2 months and if still greater than 7% then will add insulin He is making some dietary changes.  He is relatively active in his yard and home.   #2 hypertension, well controlled on hydrochlorothiazide 12.5 mg a day and losartan 50 mg a day. Electrolytes and creatinine today.  3.  Hyperlipidemia, mixed. Doing well on rosuvastatin 20 mg a day and fenofibrate 145 mg a day. Lipids and hepatic panel today   #4 chronic renal insufficiency stage II, GFR low 60s. Electrolytes and creatinine today."  INTERIM HX: He feels good. Has been doing great with diet and has had good purposeful weight loss.  Says home glucoses have been normal. He has no burning, tingling, or numbness in his feet.  Occasional home blood pressure check --- is normal.  ROS as above, plus--> no fevers, no CP, no SOB, no wheezing, no cough, no dizziness, no HAs, no rashes, no melena/hematochezia.  No polyuria or polydipsia.  No myalgias or arthralgias.  No focal weakness, paresthesias, or tremors.  No acute vision or hearing abnormalities.  No dysuria or unusual/new urinary urgency or frequency.  No recent changes in lower legs. No n/v/d or abd pain.  No palpitations.     Past Medical History:  Diagnosis Date   ANEMIA, PERNICIOUS 04/03/2007   Anxiety and depression    Arthritis    "left hand" (06/26/2016)   Chronic lower back pain    grd I degen spondylolisthesis at L4-5 w/associated synovial cyst at facet at this level +underlying severe epidural lipomatosis  L5-S1 up to L4-5--plan for surg w/Dr. Shon Baton 12/2020.   Chronic renal insufficiency, stage 3 (moderate) (HCC) 2022   GFR around 60   Colon cancer screening 10/27/2016   Cologuard NEG-->rpt 3 yrs   CORONARY ARTERY DISEASE 12/05/2006   DIABETES MELLITUS, TYPE II dx'd 2001   DVT (deep venous thrombosis) (HCC) 2016   BLE   GERD (gastroesophageal reflux disease)    History of balanitis    x 2. Holding off on circ as of 05/2020 urol f/u   History of blood transfusion 2001   "when I had my hand OR"   History of gout    History of kidney stones    HYPERCHOLESTEROLEMIA 07/17/2007   HYPERTENSION 12/05/2006   Learning disability    Migraine    "1-2/month" (06/26/2016)   Myocardial infarction (HCC) ~ 2003   Obesity, Class II, BMI 35-39.9    OSA on CPAP    Pt cannot recall setting clearly but thinks it is 5 cm h20.     PE (pulmonary embolism) 2016   Peripheral vascular disease (HCC)    Simple renal cyst    X 2 on R kidney (one 5 cm and one 2 cm)   Stroke (HCC) ~ 2006   denies residual on 06/26/2016   TIA (transient ischemic attack) ?06/25/2016   2018 and 01/2021 (MRI brain, MR angio head and neck neg 2022)   VISUAL ACUITY, DECREASED, RIGHT EYE 02/19/2009    Past Surgical History:  Procedure Laterality Date   CARDIAC CATHETERIZATION  1990s; 2012; 06/2016   06/2016 distal LAD DES stent.  EF 55-65%   CARDIOVASCULAR STRESS TEST  06/2016   Echo stress test->"inconclusive"-->cath was done after this   CATARACT EXTRACTION W/ INTRAOCULAR LENS IMPLANT Right 2017   CORONARY STENT INTERVENTION N/A 06/27/2016   Procedure: Coronary Stent Intervention;  Surgeon: Marykay Lex, MD;  Location: [facility] INVASIVE CV LAB;  Service: Cardiovascular;  Laterality: N/A;   EYE SURGERY Right    Cataract   INGUINAL HERNIA REPAIR Bilateral 1990s   INTRAVASCULAR PRESSURE WIRE/FFR STUDY N/A 06/27/2016   Procedure: Intravascular Pressure Wire/FFR Study;  Surgeon: Marykay Lex, MD;  Location: [facility] INVASIVE CV LAB;  Service:  Cardiovascular;  Laterality: N/A;   LEFT HEART CATH AND CORONARY ANGIOGRAPHY N/A 06/27/2016   Procedure: Left Heart Cath and Coronary Angiography;  Surgeon: Marykay Lex, MD;  Location: [facility] INVASIVE CV LAB;  Service: Cardiovascular;  Laterality: N/A;   LUMBAR LAMINECTOMY/DECOMPRESSION MICRODISCECTOMY Left 01/05/2021   Procedure: Left L4-5 Gill Decompression;  Surgeon: Venita Lick, MD;  Location: [facility] OR;  Service: Orthopedics;  Laterality: Left;   REATTACHMENT HAND Left ~ 2001   w/carpal tunnel release   UMBILICAL HERNIA REPAIR  1990s   w/IHR    Outpatient Medications Prior to Visit  Medication Sig Dispense Refill   Accu-Chek Softclix Lancets lancets Contact office to schedule with new provider 100 each 2   bromocriptine (PARLODEL) 2.5 MG tablet Take 1.25 mg by mouth daily.     clopidogrel (PLAVIX) 75 MG tablet TAKE 1 TABLET BY MOUTH EVERY DAY 90 tablet 3   cyanocobalamin 1000 MCG tablet Take 1 tablet (1,000 mcg total) by mouth daily. 90 tablet 3   diazepam (VALIUM) 10 MG tablet Take 1 tablet (10 mg total) by mouth in the morning and at bedtime. 180 tablet 1   DULoxetine (CYMBALTA) 30 MG capsule Take 1 capsule (30 mg total) by mouth daily. 90 capsule 1   fenofibrate (TRICOR) 145 MG tablet Take 1 tablet (145 mg total) by mouth daily. 90 tablet 3   fluconazole (DIFLUCAN) 150 MG tablet TAKE 1 TABLET BY MOUTH EVERY 7 DAYS 4 tablet 1   FLUoxetine (PROZAC) 40 MG capsule Take 1 capsule (40 mg total) by mouth daily. 90 capsule 1   fluticasone (FLONASE) 50 MCG/ACT nasal spray USE ONE SPRAY IN EACH NOSTRIL TWICE A DAY 48 g 3   gabapentin (NEURONTIN) 300 MG capsule Take 1 capsule (300 mg total) by mouth 2 (two) times daily. 180 capsule 1   glucose blood (ACCU-CHEK GUIDE) test strip 1 each by Other route daily. And lancets 1/day 100 each 2   hydrochlorothiazide (HYDRODIURIL) 12.5 MG tablet TAKE 1 TABLET BY MOUTH EVERY DAY WITH THE LOSARTAN 90 tablet 3   JARDIANCE 25 MG TABS tablet TAKE 1 TABLET BY  MOUTH EVERY DAY 90 tablet 1   metFORMIN (GLUCOPHAGE-XR) 500 MG 24 hr tablet Take 2 tablets (1,000 mg total) by mouth 2 (two) times daily. 360 tablet 1   methocarbamol (ROBAXIN) 500 MG tablet Take 500 mg by mouth 2 (two) times daily.     nitroGLYCERIN (NITROSTAT) 0.4 MG SL tablet Place 1 tablet (0.4 mg total) under the tongue every 5 (five) minutes as needed for chest pain. 25 tablet 3   nystatin cream (MYCOSTATIN) Apply 1 application topically 2 (two) times daily. 30 g 1   ondansetron (ZOFRAN) 4 MG tablet Take 1 tablet (4 mg total) by mouth every 8 (eight) hours as needed for nausea or vomiting. 20 tablet  0   rivaroxaban (XARELTO) 20 MG TABS tablet TAKE 1 TABLET BY MOUTH EVERY DAY BEFORE SUPPER 90 tablet 3   rosuvastatin (CRESTOR) 20 MG tablet Take 1 tablet (20 mg total) by mouth daily. 90 tablet 3   Semaglutide, 2 MG/DOSE, (OZEMPIC, 2 MG/DOSE,) 8 MG/3ML SOPN Inject 2 mg into the skin once a week. 9 mL 3   EPINEPHrine 0.3 mg/0.3 mL IJ SOAJ injection Inject 0.3 mg into the muscle as needed for anaphylaxis. 2 each 1   losartan (COZAAR) 100 MG tablet Take 0.5 tablets (50 mg total) by mouth daily. 45 tablet 0   No facility-administered medications prior to visit.    Allergies  Allergen Reactions   Actos [Pioglitazone] Other (See Comments)    Potential cause of DVT   Bee Venom Anaphylaxis   Klonopin [Clonazepam] Anaphylaxis   Invokana [Canagliflozin] Nausea Only    Nausea/dizzy/bad taste in mouth   Niacin Other (See Comments)    "makes his crazy"    ROS As per HPI  PE:    01/12/2022    8:20 AM 11/24/2021   10:22 AM 10/28/2021   10:52 AM  Vitals with BMI  Height 5\' 11"  5\' 11"  5\' 11"   Weight 241 lbs 3 oz 246 lbs 10 oz 247 lbs 10 oz  BMI 33.66 [id number]  Systolic A999333 91 94  Diastolic 71 64 64  Pulse 69 72 69     Physical Exam  Gen: Alert, well appearing.  Patient is oriented to person, place, time, and situation. AFFECT: pleasant, lucid thought and speech. Foot exam -  no  swelling, tenderness or skin or vascular lesions. Color and temperature is normal. Sensation is intact. Peripheral pulses are palpable. Toenails are normal.   LABS:  Last CBC Lab Results  Component Value Date   WBC 7.5 01/26/2021   HGB 13.6 01/26/2021   HCT 39.5 01/26/2021   MCV 90.4 01/26/2021   MCH 31.1 01/26/2021   RDW 13.1 01/26/2021   PLT 465 (H) [id number]   Last metabolic panel Lab Results  Component Value Date   GLUCOSE 128 (H) 10/28/2021   NA 139 10/28/2021   K 4.9 10/28/2021   CL 101 10/28/2021   CO2 28 10/28/2021   BUN 15 10/28/2021   CREATININE 1.22 10/28/2021   GFRNONAA >60 01/26/2021   CALCIUM 10.5 10/28/2021   PHOS 4.0 06/23/2015   PROT 7.0 10/28/2021   ALBUMIN 4.4 10/28/2021   BILITOT 0.4 10/28/2021   ALKPHOS 35 (L) 10/28/2021   AST 18 10/28/2021   ALT 25 10/28/2021   ANIONGAP 9 01/26/2021   Last lipids Lab Results  Component Value Date   CHOL 131 10/28/2021   HDL 56.80 10/28/2021   LDLCALC 45 10/28/2021   LDLDIRECT 58.0 04/29/2021   TRIG 147.0 10/28/2021   CHOLHDL 2 10/28/2021   Last hemoglobin A1c Lab Results  Component Value Date   HGBA1C 6.9 (A) 01/12/2022   HGBA1C 6.9 01/12/2022   HGBA1C 6.9 (A) 01/12/2022   HGBA1C 6.9 01/12/2022   Last thyroid functions Lab Results  Component Value Date   TSH 1.51 03/15/2021   Lab Results  Component Value Date   PSA 0.35 04/29/2021   PSA 0.18 01/20/2020   PSA 0.17 11/04/2018    IMPRESSION AND PLAN:  #1 diabetes without complication, good control. POC Hba1c today is 6.9% Continue Ozempic 2 mg weekly, Jardiance 25 mg a day, and metformin 1000 mg twice a day. Exam normal today  2.  Hypertension, well controlled on  HCTZ 12.5 mg a day and losartan 50 mg a day.  3.  Chronic renal insufficiency stage II/III. He avoids NSAIDs.  Hydrates well. Monitor electrolytes and creatinine today.  #4 chronic anxiety.  Chronic use of clonazepam. Urine drug screen today.  He has a controlled substance  contract in place.  An After Visit Summary was printed and given to the patient.  FOLLOW UP: Return in about 3 months (around 04/13/2022) for routine chronic illness f/u.  Signed:  Crissie Sickles, MD           01/12/2022

## 2022-01-15 LAB — DRUG MONITORING PANEL 376104, URINE
Alphahydroxyalprazolam: NEGATIVE ng/mL (ref <25)
Alphahydroxymidazolam: NEGATIVE ng/mL (ref <50)
Alphahydroxytriazolam: NEGATIVE ng/mL (ref <50)
Aminoclonazepam: NEGATIVE ng/mL (ref <25)
Amphetamines: NEGATIVE ng/mL (ref <500)
Barbiturates: NEGATIVE ng/mL (ref <300)
Benzodiazepines: POSITIVE ng/mL — AB (ref <100)
Cocaine Metabolite: NEGATIVE ng/mL (ref <150)
Desmethyltramadol: NEGATIVE ng/mL (ref <100)
Hydroxyethylflurazepam: NEGATIVE ng/mL (ref <50)
Lorazepam: NEGATIVE ng/mL (ref <50)
Nordiazepam: 600 ng/mL — ABNORMAL HIGH (ref <50)
Opiates: NEGATIVE ng/mL (ref <100)
Oxazepam: 1965 ng/mL — ABNORMAL HIGH (ref <50)
Oxycodone: NEGATIVE ng/mL (ref <100)
Temazepam: 2984 ng/mL — ABNORMAL HIGH (ref <50)
Tramadol: NEGATIVE ng/mL (ref <100)

## 2022-01-15 LAB — DM TEMPLATE

## 2022-02-02 ENCOUNTER — Other Ambulatory Visit: Payer: Self-pay | Admitting: Cardiovascular Disease

## 2022-02-03 ENCOUNTER — Other Ambulatory Visit: Payer: Self-pay | Admitting: Cardiovascular Disease

## 2022-02-03 ENCOUNTER — Other Ambulatory Visit: Payer: Self-pay

## 2022-02-03 DIAGNOSIS — E785 Hyperlipidemia, unspecified: Secondary | ICD-10-CM

## 2022-02-03 MED ORDER — ROSUVASTATIN CALCIUM 20 MG PO TABS: 20.0000 mg | ORAL_TABLET | Freq: Every day | ORAL | 0 refills | Status: DC

## 2022-02-15 ENCOUNTER — Other Ambulatory Visit: Payer: Self-pay | Admitting: Family Medicine

## 2022-02-15 ENCOUNTER — Other Ambulatory Visit: Payer: Self-pay

## 2022-02-15 NOTE — Telephone Encounter (Signed)
Received request for jardiance Rx to be refilled. Call patient and spoke with wife. They wlll reach out to PCP to have filled.

## 2022-02-21 ENCOUNTER — Other Ambulatory Visit: Payer: Self-pay

## 2022-02-21 MED ORDER — EMPAGLIFLOZIN 25 MG PO TABS: 25.0000 mg | ORAL_TABLET | Freq: Every day | ORAL | 1 refills | Status: DC

## 2022-03-06 ENCOUNTER — Other Ambulatory Visit: Payer: Self-pay | Admitting: Cardiovascular Disease

## 2022-03-06 DIAGNOSIS — E785 Hyperlipidemia, unspecified: Secondary | ICD-10-CM

## 2022-03-20 ENCOUNTER — Other Ambulatory Visit: Payer: Self-pay

## 2022-03-20 MED ORDER — TRULICITY 4.5 MG/0.5ML ~~LOC~~ SOAJ
4.5000 mg | SUBCUTANEOUS | 3 refills | Status: DC
Start: 2022-03-20 — End: 2022-03-27

## 2022-03-20 NOTE — Telephone Encounter (Signed)
Ok, trulicity rx sent. I took ozempic off his med list.

## 2022-03-20 NOTE — Telephone Encounter (Signed)
Patient was prescribed Trulicity by Dr. Loanne Drilling - no longer with [facility].  Patient states that Dr. Anitra Lauth has taken over his medications. Needs refill sent [place]  I could not find medication on his list.  He said doctor changed Ozempic to Trulicity because Ozempic was always out of stock at pharmacy. Patient states he has taken Trulicity recently. Again, I could not confirm on current med list.  Please call patient [phone]

## 2022-03-20 NOTE — Telephone Encounter (Signed)
Pt's wife, Lattie Haw advised rx sent.

## 2022-03-20 NOTE — Telephone Encounter (Signed)
RF request for Trulicity LOV: 7/86/75 Next ov: 04/14/22 Last written: 01/14/21-08/02/21, Dr.Ellison

## 2022-03-24 ENCOUNTER — Other Ambulatory Visit: Payer: Self-pay | Admitting: Cardiovascular Disease

## 2022-03-24 DIAGNOSIS — E785 Hyperlipidemia, unspecified: Secondary | ICD-10-CM

## 2022-03-27 ENCOUNTER — Telehealth: Payer: Self-pay

## 2022-03-27 DIAGNOSIS — E119 Type 2 diabetes mellitus without complications: Secondary | ICD-10-CM

## 2022-03-27 MED ORDER — ACCU-CHEK GUIDE VI STRP: ORAL_STRIP | 5 refills | Status: DC

## 2022-03-27 MED ORDER — TRULICITY 4.5 MG/0.5ML ~~LOC~~ SOAJ
4.5000 mg | SUBCUTANEOUS | 3 refills | Status: DC
  Filled 2022-09-22: qty 2, 28d supply, fill #0

## 2022-03-27 NOTE — Telephone Encounter (Signed)
Spoke with pharmacy and confirmed refill for fluoxetine available. Prescription for Trulicity and test strips received. Pt's wife advised of update

## 2022-03-27 NOTE — Telephone Encounter (Signed)
Wife Xavier [name] [facility]) calling about several of his medications, making sure all refills are coming to [facility] and all refills are going to [facility]  Please call Xavier [name] at [phone]

## 2022-03-27 NOTE — Telephone Encounter (Signed)
Pt's wife states Trulicity, test strips, Fluoxetine do not have refills or not received by the pharmacy. Advised pharmacy would be contacted to address issues.

## 2022-04-07 ENCOUNTER — Other Ambulatory Visit: Payer: Self-pay | Admitting: Family Medicine

## 2022-04-11 ENCOUNTER — Other Ambulatory Visit: Payer: Self-pay | Admitting: Cardiovascular Disease

## 2022-04-11 DIAGNOSIS — E785 Hyperlipidemia, unspecified: Secondary | ICD-10-CM

## 2022-04-14 ENCOUNTER — Encounter: Payer: Self-pay | Admitting: Family Medicine

## 2022-04-14 ENCOUNTER — Ambulatory Visit (INDEPENDENT_AMBULATORY_CARE_PROVIDER_SITE_OTHER): Payer: Medicare Other | Admitting: Family Medicine

## 2022-04-14 VITALS — BP 109/75 | HR 71 | Temp 97.5°F | Ht 71.0 in | Wt 245.0 lb

## 2022-04-14 DIAGNOSIS — E119 Type 2 diabetes mellitus without complications: Secondary | ICD-10-CM | POA: Diagnosis not present

## 2022-04-14 DIAGNOSIS — Z23 Encounter for immunization: Secondary | ICD-10-CM | POA: Diagnosis not present

## 2022-04-14 DIAGNOSIS — E7800 Pure hypercholesterolemia, unspecified: Secondary | ICD-10-CM

## 2022-04-14 DIAGNOSIS — E785 Hyperlipidemia, unspecified: Secondary | ICD-10-CM | POA: Diagnosis not present

## 2022-04-14 DIAGNOSIS — Z7901 Long term (current) use of anticoagulants: Secondary | ICD-10-CM

## 2022-04-14 DIAGNOSIS — I10 Essential (primary) hypertension: Secondary | ICD-10-CM

## 2022-04-14 LAB — BASIC METABOLIC PANEL
BUN: 24 mg/dL — ABNORMAL HIGH (ref 6–23)
CO2: 28 mEq/L (ref 19–32)
Calcium: 10.1 mg/dL (ref 8.4–10.5)
Chloride: 100 mEq/L (ref 96–112)
Creatinine, Ser: 1.19 mg/dL (ref 0.40–1.50)
GFR: 68.62 mL/min (ref >60.00)
Glucose, Bld: 165 mg/dL — ABNORMAL HIGH (ref 70–99)
Potassium: 4.5 mEq/L (ref 3.5–5.1)
Sodium: 138 mEq/L (ref 135–145)

## 2022-04-14 LAB — CBC
HCT: 45.3 % (ref 39.0–52.0)
Hemoglobin: 14.6 g/dL (ref 13.0–17.0)
MCHC: 32.3 g/dL (ref 30.0–36.0)
MCV: 93.9 fl (ref 78.0–100.0)
Platelets: 387 10*3/uL (ref 150.0–400.0)
RBC: 4.82 Mil/uL (ref 4.22–5.81)
RDW: 13.7 % (ref 11.5–15.5)
WBC: 5.8 10*3/uL (ref 4.0–10.5)

## 2022-04-14 LAB — LIPID PANEL
Cholesterol: 139 mg/dL (ref 0–200)
HDL: 70.1 mg/dL (ref >39.00)
LDL Cholesterol: 44 mg/dL (ref 0–99)
NonHDL: 68.6
Total CHOL/HDL Ratio: 2
Triglycerides: 125 mg/dL (ref 0.0–149.0)
VLDL: 25 mg/dL (ref 0.0–40.0)

## 2022-04-14 LAB — POCT GLYCOSYLATED HEMOGLOBIN (HGB A1C)
HbA1c POC (<> result, manual entry): 6.9 % (ref 4.0–5.6)
HbA1c, POC (controlled diabetic range): 6.9 % (ref 0.0–7.0)
HbA1c, POC (prediabetic range): 6.9 % — AB (ref 5.7–6.4)
Hemoglobin A1C: 6.9 % — AB (ref 4.0–5.6)

## 2022-04-14 MED ORDER — DULOXETINE HCL 30 MG PO CPEP: 30.0000 mg | ORAL_CAPSULE | Freq: Every day | ORAL | 1 refills | Status: DC

## 2022-04-14 MED ORDER — DIAZEPAM 10 MG PO TABS: 10.0000 mg | ORAL_TABLET | Freq: Two times a day (BID) | ORAL | 1 refills | Status: DC

## 2022-04-14 MED ORDER — HYDROCHLOROTHIAZIDE 12.5 MG PO TABS: ORAL_TABLET | ORAL | 1 refills | Status: DC

## 2022-04-14 MED ORDER — METFORMIN HCL ER 500 MG PO TB24: 1000.0000 mg | ORAL_TABLET | Freq: Two times a day (BID) | ORAL | 1 refills | Status: DC

## 2022-04-14 MED ORDER — ROSUVASTATIN CALCIUM 20 MG PO TABS: 20.0000 mg | ORAL_TABLET | Freq: Every day | ORAL | 3 refills | Status: DC

## 2022-04-14 MED ORDER — GABAPENTIN 300 MG PO CAPS: 300.0000 mg | ORAL_CAPSULE | Freq: Two times a day (BID) | ORAL | 1 refills | Status: DC

## 2022-04-14 MED ORDER — METHOCARBAMOL 500 MG PO TABS: 500.0000 mg | ORAL_TABLET | Freq: Two times a day (BID) | ORAL | 1 refills | Status: DC

## 2022-04-14 MED ORDER — FLUOXETINE HCL 40 MG PO CAPS: 40.0000 mg | ORAL_CAPSULE | Freq: Every day | ORAL | 1 refills | Status: DC

## 2022-04-14 MED ORDER — VITAMIN B-12 1000 MCG PO TABS: 1000.0000 ug | ORAL_TABLET | Freq: Every day | ORAL | 1 refills | Status: DC

## 2022-04-14 NOTE — Progress Notes (Signed)
OFFICE VISIT  04/14/2022  CC:  Chief Complaint  Patient presents with   Diabetes    Pt is fasting     HPI:    Patient is a 56 y.o. male who presents for 48-month follow-up diabetes, hypertension, chronic renal insufficiency stage III, and chronic anxiety. A/P as of last visit: "1 diabetes without complication, good control. POC Hba1c today is 6.9% Continue Ozempic 2 mg weekly, Jardiance 25 mg a day, and metformin 1000 mg twice a day. Exam normal today   2.  Hypertension, well controlled on HCTZ 12.5 mg a day and losartan 50 mg a day.   3.  Chronic renal insufficiency stage II/III. He avoids NSAIDs.  Hydrates well. Monitor electrolytes and creatinine today.   #4 chronic anxiety.  Chronic use of diazepam. Urine drug screen today.  He has a controlled substance contract in place"  INTERIM HX: Doing well. Unfortunately, having some interpersonal relationship with family and this has him down, understandably so. Seems to be overall coping with it pretty well. Continues to take his Valium twice a day, also Cymbalta and fluoxetine.  No home glucose monitoring or blood pressure monitoring.  He does not use any NSAIDs.  ROS as above, plus--> no fevers, no CP, no SOB, no wheezing, no cough, no dizziness, no HAs, no rashes, no melena/hematochezia.  No polyuria or polydipsia.  No myalgias or arthralgias.  No focal weakness, paresthesias, or tremors.  No acute vision or hearing abnormalities.  No dysuria or unusual/new urinary urgency or frequency.  No recent changes in lower legs. No n/v/d or abd pain.  No palpitations.     PMP AWARE reviewed today: most recent rx for diazepam was filled 03/26/2022, #60, rx by me. No red flags.  Past Medical History:  Diagnosis Date   ANEMIA, PERNICIOUS 04/03/2007   Anxiety and depression    Arthritis    "left hand" (06/26/2016)   Chronic lower back pain    grd I degen spondylolisthesis at L4-5 w/associated synovial cyst at facet at this level  +underlying severe epidural lipomatosis L5-S1 up to L4-5--plan for surg w/Dr. Shon Baton 12/2020.   Chronic renal insufficiency, stage 3 (moderate) (HCC) 2022   GFR around 60   Colon cancer screening 10/27/2016   Cologuard NEG-->rpt 3 yrs   CORONARY ARTERY DISEASE 12/05/2006   DIABETES MELLITUS, TYPE II dx'd 2001   DVT (deep venous thrombosis) (HCC) 2016   BLE   GERD (gastroesophageal reflux disease)    History of balanitis    x 2. Holding off on circ as of 05/2020 urol f/u   History of blood transfusion 2001   "when I had my hand OR"   History of gout    History of kidney stones    HYPERCHOLESTEROLEMIA 07/17/2007   HYPERTENSION 12/05/2006   Learning disability    Migraine    "1-2/month" (06/26/2016)   Myocardial infarction (HCC) ~ 2003   Obesity, Class II, BMI 35-39.9    OSA on CPAP    Pt cannot recall setting clearly but thinks it is 5 cm h20.     PE (pulmonary embolism) 2016   Peripheral vascular disease (HCC)    Simple renal cyst    X 2 on R kidney (one 5 cm and one 2 cm)   Stroke (HCC) ~ 2006   denies residual on 06/26/2016   TIA (transient ischemic attack) ?06/25/2016   2018 and 01/2021 (MRI brain, MR angio head and neck neg 2022)   VISUAL ACUITY, DECREASED, RIGHT EYE  02/19/2009    Past Surgical History:  Procedure Laterality Date   CARDIAC CATHETERIZATION  1990s; 2012; 06/2016   06/2016 distal LAD DES stent.  EF 55-65%   CARDIOVASCULAR STRESS TEST  06/2016   Echo stress test->"inconclusive"-->cath was done after this   CATARACT EXTRACTION W/ INTRAOCULAR LENS IMPLANT Right 2017   CORONARY STENT INTERVENTION N/A 06/27/2016   Procedure: Coronary Stent Intervention;  Surgeon: Marykay Lex, MD;  Location: [facility] INVASIVE CV LAB;  Service: Cardiovascular;  Laterality: N/A;   EYE SURGERY Right    Cataract   INGUINAL HERNIA REPAIR Bilateral 1990s   INTRAVASCULAR PRESSURE WIRE/FFR STUDY N/A 06/27/2016   Procedure: Intravascular Pressure Wire/FFR Study;  Surgeon: Marykay Lex, MD;   Location: [facility] INVASIVE CV LAB;  Service: Cardiovascular;  Laterality: N/A;   LEFT HEART CATH AND CORONARY ANGIOGRAPHY N/A 06/27/2016   Procedure: Left Heart Cath and Coronary Angiography;  Surgeon: Marykay Lex, MD;  Location: [facility] INVASIVE CV LAB;  Service: Cardiovascular;  Laterality: N/A;   LUMBAR LAMINECTOMY/DECOMPRESSION MICRODISCECTOMY Left 01/05/2021   Procedure: Left L4-5 Gill Decompression;  Surgeon: Venita Lick, MD;  Location: [facility] OR;  Service: Orthopedics;  Laterality: Left;   REATTACHMENT HAND Left ~ 2001   w/carpal tunnel release   UMBILICAL HERNIA REPAIR  1990s   w/IHR    Outpatient Medications Prior to Visit  Medication Sig Dispense Refill   Accu-Chek Softclix Lancets lancets Contact office to schedule with new provider 100 each 2   bromocriptine (PARLODEL) 2.5 MG tablet Take 1.25 mg by mouth daily.     clopidogrel (PLAVIX) 75 MG tablet TAKE 1 TABLET BY MOUTH EVERY DAY 90 tablet 0   Dulaglutide (TRULICITY) 4.5 MG/0.5ML SOPN Inject 4.5 mg as directed once a week. 6 mL 3   empagliflozin (JARDIANCE) 25 MG TABS tablet Take 1 tablet (25 mg total) by mouth daily. 90 tablet 1   EPINEPHrine 0.3 mg/0.3 mL IJ SOAJ injection Inject 0.3 mg into the muscle as needed for anaphylaxis. 2 each 1   fenofibrate (TRICOR) 145 MG tablet Take 1 tablet (145 mg total) by mouth daily. 90 tablet 3   fluconazole (DIFLUCAN) 150 MG tablet TAKE 1 TABLET BY MOUTH EVERY 7 DAYS 4 tablet 1   fluticasone (FLONASE) 50 MCG/ACT nasal spray USE ONE SPRAY IN EACH NOSTRIL TWICE A DAY 48 g 3   glucose blood (ACCU-CHEK GUIDE) test strip Use to check glucose 1-2 times a day. 100 each 5   losartan (COZAAR) 100 MG tablet Take 0.5 tablets (50 mg total) by mouth daily. 45 tablet 3   nitroGLYCERIN (NITROSTAT) 0.4 MG SL tablet Place 1 tablet (0.4 mg total) under the tongue every 5 (five) minutes as needed for chest pain. 25 tablet 3   nystatin cream (MYCOSTATIN) Apply 1 application topically 2 (two) times daily. 30 g 1    ondansetron (ZOFRAN) 4 MG tablet Take 1 tablet (4 mg total) by mouth every 8 (eight) hours as needed for nausea or vomiting. 20 tablet 0   rivaroxaban (XARELTO) 20 MG TABS tablet TAKE 1 TABLET BY MOUTH EVERY DAY BEFORE SUPPER 90 tablet 3   methocarbamol (ROBAXIN) 500 MG tablet Take 500 mg by mouth 2 (two) times daily.     cyanocobalamin (VITAMIN B12) 1000 MCG tablet TAKE 1 TABLET BY MOUTH EVERY DAY 90 tablet 0   diazepam (VALIUM) 10 MG tablet Take 1 tablet (10 mg total) by mouth in the morning and at bedtime. 180 tablet 1   DULoxetine (CYMBALTA) 30 MG  capsule Take 1 capsule (30 mg total) by mouth daily. 90 capsule 1   FLUoxetine (PROZAC) 40 MG capsule Take 1 capsule (40 mg total) by mouth daily. 90 capsule 1   gabapentin (NEURONTIN) 300 MG capsule Take 1 capsule (300 mg total) by mouth 2 (two) times daily. 180 capsule 1   hydrochlorothiazide (HYDRODIURIL) 12.5 MG tablet TAKE 1 TABLET BY MOUTH EVERY DAY WITH THE LOSARTAN 90 tablet 3   metFORMIN (GLUCOPHAGE-XR) 500 MG 24 hr tablet Take 2 tablets (1,000 mg total) by mouth 2 (two) times daily. 360 tablet 1   rosuvastatin (CRESTOR) 20 MG tablet Take 1 tablet (20 mg total) by mouth daily. Take 1 tablet (20 mg total) by mouth daily. Please call [phone] to schedule an appointment for future refills. Final attempt. Thank you 15 tablet 0   No facility-administered medications prior to visit.    Allergies  Allergen Reactions   Actos [Pioglitazone] Other (See Comments)    Potential cause of DVT   Bee Venom Anaphylaxis   Klonopin [Clonazepam] Anaphylaxis   Invokana [Canagliflozin] Nausea Only    Nausea/dizzy/bad taste in mouth   Niacin Other (See Comments)    "makes his crazy"    ROS As per HPI  PE:    04/14/2022    8:12 AM 01/12/2022    8:20 AM 11/24/2021   10:22 AM  Vitals with BMI  Height 5\' 11"  5\' 11"  5\' 11"   Weight 245 lbs 241 lbs 3 oz 246 lbs 10 oz  BMI 34.19 33.66 34.41  Systolic 109 102 91  Diastolic 75 71 64  Pulse 71 69 72   Physical Exam  Gen: Alert, well appearing.  Patient is oriented to person, place, time, and situation. AFFECT: pleasant, lucid thought and speech. Foot exam - no swelling, tenderness or skin or vascular lesions. Color and temperature is normal. Sensation is intact. Peripheral pulses are palpable. Toenails are normal.   LABS:  Last CBC Lab Results  Component Value Date   WBC 7.5 01/26/2021   HGB 13.6 01/26/2021   HCT 39.5 01/26/2021   MCV 90.4 01/26/2021   MCH 31.1 01/26/2021   RDW 13.1 01/26/2021   PLT 465 (H) 01/26/2021   Last metabolic panel Lab Results  Component Value Date   GLUCOSE 132 (H) 01/12/2022   NA 137 01/12/2022   K 4.4 01/12/2022   CL 98 01/12/2022   CO2 29 01/12/2022   BUN 21 01/12/2022   CREATININE 1.34 01/12/2022   GFRNONAA >60 01/26/2021   CALCIUM 10.2 01/12/2022   PHOS 4.0 06/23/2015   PROT 7.0 10/28/2021   ALBUMIN 4.4 10/28/2021   BILITOT 0.4 10/28/2021   ALKPHOS 35 (L) 10/28/2021   AST 18 10/28/2021   ALT 25 10/28/2021   ANIONGAP 9 01/26/2021   Last lipids Lab Results  Component Value Date   CHOL 131 10/28/2021   HDL 56.80 10/28/2021   LDLCALC 45 10/28/2021   LDLDIRECT 58.0 04/29/2021   TRIG 147.0 10/28/2021   CHOLHDL 2 10/28/2021   Last hemoglobin A1c Lab Results  Component Value Date   HGBA1C 6.9 (A) 04/14/2022   HGBA1C 6.9 04/14/2022   HGBA1C 6.9 (A) 04/14/2022   HGBA1C 6.9 04/14/2022   Last thyroid functions Lab Results  Component Value Date   TSH 1.51 03/15/2021   Last vitamin B12 and Folate Lab Results  Component Value Date   VITAMINB12 446 04/29/2021   FOLATE 13.6 06/12/2008   Lab Results  Component Value Date   PSA 0.35 04/29/2021  PSA 0.18 01/20/2020   PSA 0.17 11/04/2018   Lab Results  Component Value Date   HGBA1C 6.9 (A) 04/14/2022   HGBA1C 6.9 04/14/2022   HGBA1C 6.9 (A) 04/14/2022   HGBA1C 6.9 04/14/2022   IMPRESSION AND PLAN:  #1 diabetes with nephropathy. Good control. Point-of-care  hemoglobin A1c today is 6.9%. Feet exam normal today.  Continue Trulicity 4.5 mg weekly, Jardiance 25 mg a day, and metformin 1000 mg twice a day.  2 hypertension, well controlled on 12.5 mg HCTZ daily and losartan 50 mg daily. Electrolytes and creatinine today.  #3 chronic renal insufficiency stage III.  He avoids NSAIDs and hydrates well. Electrolytes and creatinine today.  4.  Chronic anxiety. Stable on duloxetine 30 mg a day, fluoxetine 40 mg a day, and diazepam 10 mg twice daily.  An After Visit Summary was printed and given to the patient.  FOLLOW UP: Return in about 3 months (around 07/14/2022) for routine chronic illness f/u.  Signed:  Santiago BumpersPhil Orlie Cundari, MD           04/14/2022

## 2022-04-17 NOTE — Telephone Encounter (Signed)
Left message for patient to call back. Will schedule appointment when he calls back.

## 2022-05-05 ENCOUNTER — Other Ambulatory Visit: Payer: Self-pay | Admitting: Cardiovascular Disease

## 2022-05-10 ENCOUNTER — Ambulatory Visit (INDEPENDENT_AMBULATORY_CARE_PROVIDER_SITE_OTHER): Payer: Medicare Other

## 2022-05-10 DIAGNOSIS — Z0000 Encounter for general adult medical examination without abnormal findings: Secondary | ICD-10-CM

## 2022-05-10 NOTE — Progress Notes (Signed)
Subjective:   Xavier [name]. is a 56 y.o. male who presents for Medicare Annual/Subsequent preventive examination. I connected with  [name]. on 05/10/22 by a audio enabled telemedicine application and verified that I am speaking with the correct person using two identifiers.  Patient Location: Home  Provider Location: Office/Clinic  I discussed the limitations of evaluation and management by telemedicine. The patient expressed understanding and agreed to proceed.  Review of Systems     Cardiac Risk Factors include: advanced age (>83mn, >>22women);hypertension;diabetes mellitus;male gender;dyslipidemia;sedentary lifestyle     Objective:    There were no vitals filed for this visit. There is no height or weight on file to calculate BMI.     05/10/2022    2:06 PM 07/14/2021    9:36 AM 05/05/2021    1:01 PM 01/26/2021   12:00 AM 01/25/2021    3:11 PM 01/24/2021   12:51 PM 01/05/2021   11:33 AM  Advanced Directives  Does Patient Have a Medical Advance Directive? No No Yes No No No No  Would patient like information on creating a medical advance directive? No - Patient declined Yes (ED - Information included in AVS)  No - Patient declined   No - Patient declined    Current Medications (verified) Outpatient Encounter Medications as of 05/10/2022  Medication Sig   Accu-Chek Softclix Lancets lancets Contact office to schedule with new provider   bromocriptine (PARLODEL) 2.5 MG tablet Take 1.25 mg by mouth daily.   clopidogrel (PLAVIX) 75 MG tablet TAKE 1 TABLET BY MOUTH EVERY DAY   cyanocobalamin (VITAMIN B12) 1000 MCG tablet Take 1 tablet (1,000 mcg total) by mouth daily.   diazepam (VALIUM) 10 MG tablet Take 1 tablet (10 mg total) by mouth in the morning and at bedtime.   Dulaglutide (TRULICITY) 4.5 MNT/6.1WESOPN Inject 4.5 mg as directed once a week.   DULoxetine (CYMBALTA) 30 MG capsule Take 1 capsule (30 mg total) by mouth daily.   empagliflozin (JARDIANCE) 25 MG  TABS tablet Take 1 tablet (25 mg total) by mouth daily.   EPINEPHrine 0.3 mg/0.3 mL IJ SOAJ injection Inject 0.3 mg into the muscle as needed for anaphylaxis.   fenofibrate (TRICOR) 145 MG tablet Take 1 tablet (145 mg total) by mouth daily.   fluconazole (DIFLUCAN) 150 MG tablet TAKE 1 TABLET BY MOUTH EVERY 7 DAYS   FLUoxetine (PROZAC) 40 MG capsule Take 1 capsule (40 mg total) by mouth daily.   fluticasone (FLONASE) 50 MCG/ACT nasal spray USE ONE SPRAY IN EACH NOSTRIL TWICE A DAY   gabapentin (NEURONTIN) 300 MG capsule Take 1 capsule (300 mg total) by mouth 2 (two) times daily.   glucose blood (ACCU-CHEK GUIDE) test strip Use to check glucose 1-2 times a day.   hydrochlorothiazide (HYDRODIURIL) 12.5 MG tablet TAKE 1 TABLET BY MOUTH EVERY DAY WITH THE LOSARTAN   losartan (COZAAR) 100 MG tablet Take 0.5 tablets (50 mg total) by mouth daily.   metFORMIN (GLUCOPHAGE-XR) 500 MG 24 hr tablet Take 2 tablets (1,000 mg total) by mouth 2 (two) times daily.   methocarbamol (ROBAXIN) 500 MG tablet Take 1 tablet (500 mg total) by mouth 2 (two) times daily.   nitroGLYCERIN (NITROSTAT) 0.4 MG SL tablet Place 1 tablet (0.4 mg total) under the tongue every 5 (five) minutes as needed for chest pain.   nystatin cream (MYCOSTATIN) Apply 1 application topically 2 (two) times daily.   ondansetron (ZOFRAN) 4 MG tablet Take 1 tablet (4  mg total) by mouth every 8 (eight) hours as needed for nausea or vomiting.   rivaroxaban (XARELTO) 20 MG TABS tablet TAKE 1 TABLET BY MOUTH EVERY DAY BEFORE SUPPER   rosuvastatin (CRESTOR) 20 MG tablet Take 1 tablet (20 mg total) by mouth daily. Take 1 tablet (20 mg total) by mouth daily. Please call [phone] to schedule an appointment for future refills. Final attempt. Thank you   No facility-administered encounter medications on file as of 05/10/2022.    Allergies (verified) Actos [pioglitazone], Bee venom, Klonopin [clonazepam], Invokana [canagliflozin], and Niacin    History: Past Medical History:  Diagnosis Date   ANEMIA, PERNICIOUS 04/03/2007   Anxiety and depression    Arthritis    "left hand" (06/26/2016)   Chronic lower back pain    grd I degen spondylolisthesis at L4-5 w/associated synovial cyst at facet at this level +underlying severe epidural lipomatosis L5-S1 up to L4-5--plan for surg w/Dr. Rolena Infante 12/2020.   Chronic renal insufficiency, stage 3 (moderate) ([place]) 2022   GFR around 60   Colon cancer screening 10/27/2016   Cologuard NEG-->rpt 3 yrs   CORONARY ARTERY DISEASE 12/05/2006   DIABETES MELLITUS, TYPE II dx'd 2001   DVT (deep venous thrombosis) ([place]) 2016   BLE   GERD (gastroesophageal reflux disease)    History of balanitis    x 2. Holding off on circ as of 05/2020 urol f/u   History of blood transfusion 2001   "when I had my hand OR"   History of gout    History of kidney stones    HYPERCHOLESTEROLEMIA 07/17/2007   HYPERTENSION 12/05/2006   Learning disability    Migraine    "1-2/month" (06/26/2016)   Myocardial infarction ([place]) ~ 2003   Obesity, Class II, BMI 35-39.9    OSA on CPAP    Pt cannot recall setting clearly but thinks it is 5 cm h20.     PE (pulmonary embolism) 2016   Peripheral vascular disease ([place])    Simple renal cyst    X 2 on R kidney (one 5 cm and one 2 cm)   Stroke ([place]) ~ 2006   denies residual on 06/26/2016   TIA (transient ischemic attack) ?06/25/2016   2018 and 01/2021 (MRI brain, MR angio head and neck neg 2022)   VISUAL ACUITY, DECREASED, RIGHT EYE 02/19/2009   Past Surgical History:  Procedure Laterality Date   CARDIAC CATHETERIZATION  1990s; 2012; 06/2016   06/2016 distal LAD DES stent.  EF 55-65%   CARDIOVASCULAR STRESS TEST  06/2016   Echo stress test->"inconclusive"-->cath was done after this   CATARACT EXTRACTION W/ INTRAOCULAR LENS IMPLANT Right 2017   CORONARY STENT INTERVENTION N/A 06/27/2016   Procedure: Coronary Stent Intervention;  Surgeon: Leonie Man, MD;  Location: [facility];  Service: Cardiovascular;  Laterality: N/A;   EYE SURGERY Right    Cataract   INGUINAL HERNIA REPAIR Bilateral 1990s   INTRAVASCULAR PRESSURE WIRE/FFR STUDY N/A 06/27/2016   Procedure: Intravascular Pressure Wire/FFR Study;  Surgeon: Leonie Man, MD;  Location: [facility];  Service: Cardiovascular;  Laterality: N/A;   LEFT HEART CATH AND CORONARY ANGIOGRAPHY N/A 06/27/2016   Procedure: Left Heart Cath and Coronary Angiography;  Surgeon: Leonie Man, MD;  Location: [facility];  Service: Cardiovascular;  Laterality: N/A;   LUMBAR LAMINECTOMY/DECOMPRESSION MICRODISCECTOMY Left 01/05/2021   Procedure: Left L4-5 Gill Decompression;  Surgeon: Melina Schools, MD;  Location: [facility];  Service: Orthopedics;  Laterality: Left;  REATTACHMENT HAND Left ~ 2001   w/carpal tunnel release   UMBILICAL HERNIA REPAIR  1990s   w/IHR   Family History  Problem Relation Age of Onset   COPD Mother    Lymphoma Father    Social History   Socioeconomic History   Marital status: Married    Spouse name: Lattie Haw   Number of children: Not on file   Years of education: Not on file   Highest education level: Not on file  Occupational History   Occupation: Disabled    Employer: DISABLED  Tobacco Use   Smoking status: Former    Packs/day: 0.10    Years: 3.00    Total pack years: 0.30    Types: Cigarettes    Start date: 1985    Quit date: 05/15/1984    Years since quitting: 38.0   Smokeless tobacco: Never  Vaping Use   Vaping Use: Never used  Substance and Sexual Activity   Alcohol use: No   Drug use: No   Sexual activity: Not on file  Other Topics Concern   Not on file  Social History Narrative   Lives with wife and youngest son   Right Hand   Drinks no caffeine   Social Determinants of Health   Financial Resource Strain: Low Risk  (05/10/2022)   Overall Financial Resource Strain (CARDIA)    Difficulty of Paying Living Expenses: Not hard at all  Food  Insecurity: No Food Insecurity (05/10/2022)   Hunger Vital Sign    Worried About Running Out of Food in the Last Year: Never true    [place] in the Last Year: Never true  Transportation Needs: No Transportation Needs (05/10/2022)   PRAPARE - Hydrologist (Medical): No    Lack of Transportation (Non-Medical): No  Physical Activity: Inactive (05/10/2022)   Exercise Vital Sign    Days of Exercise per Week: 0 days    Minutes of Exercise per Session: 0 min  Stress: No Stress Concern Present (05/10/2022)   [place]    Feeling of Stress : Not at all  Social Connections: Moderately Integrated (05/10/2022)   Social Connection and Isolation Panel [NHANES]    Frequency of Communication with Friends and Family: More than three times a week    Frequency of Social Gatherings with Friends and Family: More than three times a week    Attends Religious Services: Never    Marine scientist or Organizations: Yes    Attends Music therapist: 1 to 4 times per year    Marital Status: Married    Tobacco Counseling Counseling given: Not Answered   Clinical Intake:  Pre-visit preparation completed: Yes  Pain : No/denies pain     Diabetes: Yes CBG done?: No Did pt. bring in CBG monitor from home?: No  How often do you need to have someone help you when you read instructions, pamphlets, or other written materials from your doctor or pharmacy?: 4 - Often What is the last grade level you completed in school?: 12  Diabetic?yes   Interpreter Needed?: No  Information entered by :: Hanh Kertesz cma   Activities of Daily Living    05/10/2022    1:58 PM  In your present state of health, do you have any difficulty performing the following activities:  Hearing? 0  Vision? 0  Difficulty concentrating or making decisions? 1  Walking or climbing stairs? 0  Dressing or  bathing? 0  Doing errands, shopping? 1  Preparing Food and eating ? N  Using the Toilet? N  In the past six months, have you accidently leaked urine? N  Do you have problems with loss of bowel control? N  Managing your Medications? Y  Managing your Finances? Y  Housekeeping or managing your Housekeeping? N    Patient Care Team: Tammi Sou, MD as PCP - General (Family Medicine) Josue Hector, MD as PCP - Cardiology (Cardiology) [name], MD (Inactive) as Consulting Physician (Endocrinology) Chucky May, MD as Consulting Physician (Psychiatry) Tanda Rockers, MD as Consulting Physician (Pulmonary Disease) Chesley Mires, MD as Consulting Physician (Pulmonary Disease) Lucas Mallow, MD as Consulting Physician (Urology)  Indicate any recent Medical Services you may have received from other than [facility] providers in the past year (date may be approximate).     Assessment:   This is a routine wellness examination for [place].  Hearing/Vision screen No results found.  Dietary issues and exercise activities discussed: Current Exercise Habits: Home exercise routine, Type of exercise: walking, Time (Minutes): 10, Frequency (Times/Week): 7, Weekly Exercise (Minutes/Week): 70, Intensity: Mild, Exercise limited by: cardiac condition(s);neurologic condition(s)   Goals Addressed             This Visit's Progress    Weight (lb) < 200 lb (90.7 kg)         Depression Screen    05/10/2022    2:04 PM 11/24/2021   10:34 AM 04/29/2021    9:46 AM 04/28/2020    2:29 PM  PHQ 2/9 Scores  PHQ - 2 Score 2 4 0 0  PHQ- 9 Score 5 20 0     Fall Risk    05/10/2022    2:06 PM 05/05/2021    1:01 PM 04/28/2020    2:27 PM  Fall Risk   Falls in the past year? 0 0 0  Number falls in past yr: 0 0 0  Injury with Fall? 0 0 0  Risk for fall due to : No Fall Risks No Fall Risks   Follow up Falls evaluation completed Falls evaluation completed Falls prevention discussed    [place]:  Any stairs in or around the home? No  If so, are there any without handrails? No  Home free of loose throw rugs in walkways, pet beds, electrical cords, etc? No  Adequate lighting in your home to reduce risk of falls? Yes   ASSISTIVE DEVICES UTILIZED TO PREVENT FALLS:  Life alert? No  Use of a cane, walker or w/c? Yes  Grab bars in the bathroom? No  Shower chair or bench in shower? No  Elevated toilet seat or a handicapped toilet? Yes   TIMED UP AND GO:  Was the test performed? No .      Cognitive Function:        05/10/2022    2:06 PM 05/05/2021    1:04 PM  6CIT Screen  What Year? 0 points 0 points  What month? 0 points 0 points  What time? 0 points 0 points  Count back from 20 0 points 0 points  Months in reverse 4 points 2 points  Repeat phrase 0 points 0 points  Total Score 4 points 2 points    Immunizations Immunization History  Administered Date(s) Administered   Influenza,inj,Quad PF,6+ Mos 06/23/2015, 01/10/2018, 01/17/2019, 01/20/2020, 04/14/2022   PFIZER(Purple Top)SARS-COV-2 Vaccination 12/06/2019, 12/27/2019   PNEUMOCOCCAL CONJUGATE-20  04/29/2021   Pneumococcal Polysaccharide-23 10/07/2010   Td 12/17/2008   Td (Adult), 2 Lf Tetanus Toxid, Preservative Free 12/17/2008   Tdap 01/17/2019, 01/22/2020   Zoster Recombinat (Shingrix) 01/22/2020, 04/29/2021    TDAP status: Up to date  Flu Vaccine status: Up to date  Pneumococcal vaccine status: Up to date  Covid-19 vaccine status: Information provided on how to obtain vaccines.   Qualifies for Shingles Vaccine? Yes   Zostavax completed No   Shingrix Completed?: Yes  Screening Tests Health Maintenance  Topic Date Due   Hepatitis C Screening  Never done   COVID-19 Vaccine (3 - Pfizer risk series) 05/26/2022 (Originally 01/24/2020)   OPHTHALMOLOGY EXAM  05/11/2023 (Originally 09/21/2015)   HEMOGLOBIN A1C  10/14/2022   Diabetic kidney evaluation - Urine ACR   10/29/2022   Diabetic kidney evaluation - eGFR measurement  04/15/2023   FOOT EXAM  04/15/2023   Medicare Annual Wellness (AWV)  05/11/2023   Fecal DNA (Cologuard)  05/13/2024   DTaP/Tdap/Td (5 - Td or Tdap) 01/21/2030   INFLUENZA VACCINE  Completed   Zoster Vaccines- Shingrix  Completed   HPV VACCINES  Aged Out   COLONOSCOPY (Pts 45-61yr Insurance coverage will need to be confirmed)  Discontinued   HIV Screening  Discontinued    Health Maintenance  Health Maintenance Due  Topic Date Due   Hepatitis C Screening  Never done    Colorectal cancer screening: Type of screening: Cologuard. Completed [id number] Repeat every 3 years  Lung Cancer Screening: (Low Dose CT Chest recommended if Age 55-80years, 30 pack-year currently smoking OR have quit w/in 15years.) does not qualify.   Additional Screening:  Hepatitis C Screening: does qualify.  Vision Screening: Recommended annual ophthalmology exams for early detection of glaucoma and other disorders of the eye. Is the patient up to date with their annual eye exam?  No  Who is the provider or what is the name of the office in which the patient attends annual eye exams? unsure If pt is not established with a provider, would they like to be referred to a provider to establish care? Yes .   Dental Screening: Recommended annual dental exams for proper oral hygiene  Community Resource Referral / Chronic Care Management: CRR required this visit?  No   CCM required this visit?  No      Plan:     I have personally reviewed and noted the following in the patient's chart:   Medical and social history Use of alcohol, tobacco or illicit drugs  Current medications and supplements including opioid prescriptions. Patient is not currently taking opioid prescriptions. Functional ability and status Nutritional status Physical activity Advanced directives List of other physicians Hospitalizations, surgeries, and ER visits in previous 12  months Vitals Screenings to include cognitive, depression, and falls Referrals and appointments  In addition, I have reviewed and discussed with patient certain preventive protocols, quality metrics, and best practice recommendations. A written personalized care plan for preventive services as well as general preventive health recommendations were provided to patient.     RWestley Hummer CMA   05/10/2022   Nurse Notes: Non face to face 15 minutes.  Xavier [name], Thank you for taking time to come for your Medicare Wellness Visit. I appreciate your ongoing commitment to your health goals. Please review the following plan we discussed and let me know if I can assist you in the future.   These are the goals we discussed:  Goals  Patient Stated     Increase activity, lose some weight & do more activities for brain stimulation.     Weight (lb) < 200 lb (90.7 kg)        This is a list of the screening recommended for you and due dates:  Health Maintenance  Topic Date Due   Hepatitis C Screening: USPSTF Recommendation to screen - Ages 65-79 yo.  Never done   COVID-19 Vaccine (3 - Pfizer risk series) 05/26/2022*   Eye exam for diabetics  05/11/2023*   Hemoglobin A1C  10/14/2022   Yearly kidney health urinalysis for diabetes  10/29/2022   Yearly kidney function blood test for diabetes  04/15/2023   Complete foot exam   04/15/2023   Medicare Annual Wellness Visit  05/11/2023   Cologuard (Stool DNA test)  05/13/2024   DTaP/Tdap/Td vaccine (5 - Td or Tdap) 01/21/2030   Flu Shot  Completed   Zoster (Shingles) Vaccine  Completed   HPV Vaccine  Aged Out   Colon Cancer Screening  Discontinued   HIV Screening  Discontinued  *Topic was postponed. The date shown is not the original due date.

## 2022-05-10 NOTE — Patient Instructions (Signed)
  [name] , Thank you for taking time to come for your Medicare Wellness Visit. I appreciate your ongoing commitment to your health goals. Please review the following plan we discussed and let me know if I can assist you in the future.   These are the goals we discussed:  Goals      Patient Stated     Increase activity, lose some weight & do more activities for brain stimulation.        This is a list of the screening recommended for you and due dates:  Health Maintenance  Topic Date Due   Hepatitis C Screening: USPSTF Recommendation to screen - Ages 47-79 yo.  Never done   Eye exam for diabetics  09/21/2015   COVID-19 Vaccine (3 - Pfizer risk series) 01/24/2020   Hemoglobin A1C  10/14/2022   Yearly kidney health urinalysis for diabetes  10/29/2022   Yearly kidney function blood test for diabetes  04/15/2023   Complete foot exam   04/15/2023   Medicare Annual Wellness Visit  05/11/2023   Cologuard (Stool DNA test)  05/13/2024   DTaP/Tdap/Td vaccine (5 - Td or Tdap) 01/21/2030   Flu Shot  Completed   Zoster (Shingles) Vaccine  Completed   HPV Vaccine  Aged Out   Colon Cancer Screening  Discontinued   HIV Screening  Discontinued

## 2022-05-22 ENCOUNTER — Other Ambulatory Visit: Payer: Self-pay

## 2022-05-22 MED ORDER — FENOFIBRATE 145 MG PO TABS: 145.0000 mg | ORAL_TABLET | Freq: Every day | ORAL | 0 refills | Status: DC

## 2022-06-07 ENCOUNTER — Other Ambulatory Visit: Payer: Self-pay | Admitting: Cardiovascular Disease

## 2022-06-07 ENCOUNTER — Other Ambulatory Visit: Payer: Self-pay

## 2022-06-07 MED ORDER — RIVAROXABAN 20 MG PO TABS: ORAL_TABLET | ORAL | 3 refills | Status: DC

## 2022-06-19 ENCOUNTER — Other Ambulatory Visit: Payer: Self-pay | Admitting: Family Medicine

## 2022-07-06 ENCOUNTER — Other Ambulatory Visit: Payer: Self-pay

## 2022-07-06 MED ORDER — FLUTICASONE PROPIONATE 50 MCG/ACT NA SUSP: NASAL | 3 refills | Status: DC

## 2022-07-09 ENCOUNTER — Other Ambulatory Visit: Payer: Self-pay | Admitting: Cardiovascular Disease

## 2022-07-10 ENCOUNTER — Other Ambulatory Visit: Payer: Self-pay | Admitting: Cardiovascular Disease

## 2022-07-10 NOTE — Telephone Encounter (Signed)
Rx refill sent to pharmacy. 

## 2022-07-19 ENCOUNTER — Encounter: Payer: Self-pay | Admitting: Family Medicine

## 2022-07-19 ENCOUNTER — Ambulatory Visit (INDEPENDENT_AMBULATORY_CARE_PROVIDER_SITE_OTHER): Payer: Medicare Other | Admitting: Family Medicine

## 2022-07-19 VITALS — BP 123/80 | HR 68 | Temp 97.9°F | Ht 71.0 in | Wt 252.2 lb

## 2022-07-19 DIAGNOSIS — F411 Generalized anxiety disorder: Secondary | ICD-10-CM | POA: Diagnosis not present

## 2022-07-19 DIAGNOSIS — E1121 Type 2 diabetes mellitus with diabetic nephropathy: Secondary | ICD-10-CM | POA: Diagnosis not present

## 2022-07-19 DIAGNOSIS — N1831 Chronic kidney disease, stage 3a: Secondary | ICD-10-CM | POA: Diagnosis not present

## 2022-07-19 DIAGNOSIS — I10 Essential (primary) hypertension: Secondary | ICD-10-CM

## 2022-07-19 LAB — COMPREHENSIVE METABOLIC PANEL
ALT: 21 U/L (ref 0–53)
AST: 21 U/L (ref 0–37)
Albumin: 4.2 g/dL (ref 3.5–5.2)
Alkaline Phosphatase: 45 U/L (ref 39–117)
BUN: 17 mg/dL (ref 6–23)
CO2: 29 mEq/L (ref 19–32)
Calcium: 10.3 mg/dL (ref 8.4–10.5)
Chloride: 99 mEq/L (ref 96–112)
Creatinine, Ser: 1.2 mg/dL (ref 0.40–1.50)
GFR: 67.81 mL/min (ref >60.00)
Glucose, Bld: 206 mg/dL — ABNORMAL HIGH (ref 70–99)
Potassium: 4.4 mEq/L (ref 3.5–5.1)
Sodium: 139 mEq/L (ref 135–145)
Total Bilirubin: 0.5 mg/dL (ref 0.2–1.2)
Total Protein: 6.9 g/dL (ref 6.0–8.3)

## 2022-07-19 LAB — POCT GLYCOSYLATED HEMOGLOBIN (HGB A1C)
HbA1c POC (<> result, manual entry): 8.2 % (ref 4.0–5.6)
HbA1c, POC (controlled diabetic range): 8.2 % — AB (ref 0.0–7.0)
HbA1c, POC (prediabetic range): 8.2 % — AB (ref 5.7–6.4)
Hemoglobin A1C: 8.2 % — AB (ref 4.0–5.6)

## 2022-07-19 MED ORDER — FLUOXETINE HCL 40 MG PO CAPS: 40.0000 mg | ORAL_CAPSULE | Freq: Every day | ORAL | 1 refills | Status: DC

## 2022-07-19 MED ORDER — VITAMIN B-12 1000 MCG PO TABS: 1000.0000 ug | ORAL_TABLET | Freq: Every day | ORAL | 1 refills | Status: DC

## 2022-07-19 MED ORDER — EMPAGLIFLOZIN 25 MG PO TABS: 25.0000 mg | ORAL_TABLET | Freq: Every day | ORAL | 1 refills | Status: DC

## 2022-07-19 MED ORDER — GABAPENTIN 300 MG PO CAPS: 300.0000 mg | ORAL_CAPSULE | Freq: Two times a day (BID) | ORAL | 1 refills | Status: DC

## 2022-07-19 MED ORDER — METFORMIN HCL ER 500 MG PO TB24: 1000.0000 mg | ORAL_TABLET | Freq: Two times a day (BID) | ORAL | 1 refills | Status: DC

## 2022-07-19 MED ORDER — HYDROCHLOROTHIAZIDE 12.5 MG PO TABS: ORAL_TABLET | ORAL | 1 refills | Status: DC

## 2022-07-19 MED ORDER — FENOFIBRATE 145 MG PO TABS: 145.0000 mg | ORAL_TABLET | Freq: Every day | ORAL | 1 refills | Status: DC

## 2022-07-19 MED ORDER — DULOXETINE HCL 30 MG PO CPEP: 30.0000 mg | ORAL_CAPSULE | Freq: Every day | ORAL | 1 refills | Status: DC

## 2022-07-19 NOTE — Progress Notes (Signed)
OFFICE VISIT  07/19/2022  CC: No chief complaint on file.   Patient is a 57 y.o. male who presents for 16-monthfollow-up diabetes, hypertension, chronic renal insufficiency, and chronic anxiety. A/P as of last visit: "#1 diabetes with nephropathy. Good control. Point-of-care hemoglobin A1c today is 6.9%. Feet exam normal today.  Continue Trulicity 4.5 mg weekly, Jardiance 25 mg a day, and metformin 1000 mg twice a day.   2 hypertension, well controlled on 12.5 mg HCTZ daily and losartan 50 mg daily. Electrolytes and creatinine today.   #3 chronic renal insufficiency stage III.  He avoids NSAIDs and hydrates well. Electrolytes and creatinine today.   4.  Chronic anxiety. Stable on duloxetine 30 mg a day, fluoxetine 40 mg a day, and diazepam 10 mg twice daily.  INTERIM HX: Feeling fine. No burning/numbness in feet.  He has been taking only one of his glucophage tabs twice a day instead of 2 bid. Some dietary indescretion since last visit, not as active. Fasting gluc 90s to 120s but sometimes >200. No hypoglycemia.   PMP AWARE reviewed today: most recent rx for diazepam was filled 06/29/2022, # [id number] rx by me. No red flags.  Past Medical History:  Diagnosis Date   ANEMIA, PERNICIOUS 04/03/2007   Anxiety and depression    Arthritis    "left hand" (06/26/2016)   Chronic lower back pain    grd I degen spondylolisthesis at L4-5 w/associated synovial cyst at facet at this level +underlying severe epidural lipomatosis L5-S1 up to L4-5--plan for surg w/Dr. BRolena Infante8/2022.   Chronic renal insufficiency, stage 3 (moderate) ([place] 2022   GFR around 60   Colon cancer screening 10/27/2016   Cologuard NEG-->rpt 3 yrs   CORONARY ARTERY DISEASE 12/05/2006   DIABETES MELLITUS, TYPE II dx'd 2001   DVT (deep venous thrombosis) ([place] 2016   BLE   GERD (gastroesophageal reflux disease)    History of balanitis    x 2. Holding off on circ as of 05/2020 urol f/u   History of blood transfusion  2001   "when I had my hand OR"   History of gout    History of kidney stones    HYPERCHOLESTEROLEMIA 07/17/2007   HYPERTENSION 12/05/2006   Learning disability    Migraine    "1-2/month" (06/26/2016)   Myocardial infarction ([facility]   Obesity, Class II, BMI 35-39.9    OSA on CPAP    Pt cannot recall setting clearly but thinks it is 5 cm h20.     PE (pulmonary embolism) 2016   Peripheral vascular disease ([facility]    Simple renal cyst    X 2 on R kidney (one 5 cm and one 2 cm)   [place]   denies residual on 06/26/2016   TIA (transient ischemic attack) ?06/25/2016   2018 and 01/2021 (MRI brain, MR angio head and neck neg 2022)   VISUAL ACUITY, DECREASED, RIGHT EYE 02/19/2009    Past Surgical History:  Procedure Laterality Date   CARDIAC CATHETERIZATION  1990s; 2012; 06/2016   06/2016 distal LAD DES stent.  EF 55-65%   CARDIOVASCULAR STRESS TEST  06/2016   Echo stress test->"inconclusive"-->cath was done after this   CATARACT EXTRACTION W/ INTRAOCULAR LENS IMPLANT Right 2017   CORONARY STENT INTERVENTION N/A 06/27/2016   Procedure: Coronary Stent Intervention;  Surgeon: DLeonie Man MD;  [facility];  Service: Cardiovascular;  Laterality: N/A;   EYE SURGERY Right    Cataract  INGUINAL HERNIA REPAIR Bilateral 1990s   INTRAVASCULAR PRESSURE WIRE/FFR STUDY N/A 06/27/2016   Procedure: Intravascular Pressure Wire/FFR Study;  Surgeon: Leonie Man, MD;  Location: [place];  Service: Cardiovascular;  Laterality: N/A;   LEFT HEART CATH AND CORONARY ANGIOGRAPHY N/A 06/27/2016   Procedure: Left Heart Cath and Coronary Angiography;  Surgeon: Leonie Man, MD;  Location: [place] [facility];  Service: Cardiovascular;  Laterality: N/A;   LUMBAR LAMINECTOMY/DECOMPRESSION MICRODISCECTOMY Left 01/05/2021   Procedure: Left L4-5 Gill Decompression;  Surgeon: Melina Schools, MD;  Location: [facility];  Service: Orthopedics;  Laterality: Left;   REATTACHMENT HAND  Left ~ 2001   w/carpal tunnel release   UMBILICAL HERNIA REPAIR  1990s   w/IHR    Outpatient Medications Prior to Visit  Medication Sig Dispense Refill   Accu-Chek Softclix Lancets lancets Contact office to schedule with new provider 100 each 2   bromocriptine (PARLODEL) 2.5 MG tablet Take 1.25 mg by mouth daily.     clopidogrel (PLAVIX) 75 MG tablet Take 1 tablet (75 mg total) by mouth daily. 30 tablet 0   cyanocobalamin (VITAMIN B12) 1000 MCG tablet Take 1 tablet (1,000 mcg total) by mouth daily. 90 tablet 1   diazepam (VALIUM) 10 MG tablet Take 1 tablet (10 mg total) by mouth in the morning and at bedtime. 180 tablet 1   Dulaglutide (TRULICITY) 4.5 0000000 SOPN Inject 4.5 mg as directed once a week. 6 mL 3   DULoxetine (CYMBALTA) 30 MG capsule Take 1 capsule (30 mg total) by mouth daily. 90 capsule 1   empagliflozin (JARDIANCE) 25 MG TABS tablet Take 1 tablet (25 mg total) by mouth daily. 90 tablet 1   fenofibrate (TRICOR) 145 MG tablet Take 1 tablet (145 mg total) by mouth daily. 90 tablet 0   fluconazole (DIFLUCAN) 150 MG tablet TAKE 1 TABLET BY MOUTH EVERY 7 DAYS 4 tablet 1   FLUoxetine (PROZAC) 40 MG capsule Take 1 capsule (40 mg total) by mouth daily. 90 capsule 1   fluticasone (FLONASE) 50 MCG/ACT nasal spray USE ONE SPRAY IN EACH NOSTRIL TWICE A DAY 48 g 3   gabapentin (NEURONTIN) 300 MG capsule Take 1 capsule (300 mg total) by mouth 2 (two) times daily. 180 capsule 1   glucose blood (ACCU-CHEK GUIDE) test strip Use to check glucose 1-2 times a day. 100 each 5   hydrochlorothiazide (HYDRODIURIL) 12.5 MG tablet TAKE 1 TABLET BY MOUTH EVERY DAY WITH THE LOSARTAN 90 tablet 1   losartan (COZAAR) 100 MG tablet Take 0.5 tablets (50 mg total) by mouth daily. 45 tablet 3   metFORMIN (GLUCOPHAGE-XR) 500 MG 24 hr tablet Take 2 tablets (1,000 mg total) by mouth 2 (two) times daily. 360 tablet 1   methocarbamol (ROBAXIN) 500 MG tablet Take 1 tablet (500 mg total) by mouth 2 (two) times daily.  90 tablet 1   nystatin cream (MYCOSTATIN) Apply 1 application topically 2 (two) times daily. 30 g 1   rivaroxaban (XARELTO) 20 MG TABS tablet TAKE 1 TABLET BY MOUTH EVERY DAY BEFORE SUPPER 90 tablet 3   rosuvastatin (CRESTOR) 20 MG tablet Take 1 tablet (20 mg total) by mouth daily. Take 1 tablet (20 mg total) by mouth daily. Please call [phone] to schedule an appointment for future refills. Final attempt. Thank you 90 tablet 3   EPINEPHrine 0.3 mg/0.3 mL IJ SOAJ injection Inject 0.3 mg into the muscle as needed for anaphylaxis. (Patient not taking: Reported on 07/19/2022) 2 each 1  nitroGLYCERIN (NITROSTAT) 0.4 MG SL tablet Place 1 tablet (0.4 mg total) under the tongue every 5 (five) minutes as needed for chest pain. (Patient not taking: Reported on 07/19/2022) 25 tablet 3   ondansetron (ZOFRAN) 4 MG tablet Take 1 tablet (4 mg total) by mouth every 8 (eight) hours as needed for nausea or vomiting. (Patient not taking: Reported on 07/19/2022) 20 tablet 0   No facility-administered medications prior to visit.    Allergies  Allergen Reactions   Actos [Pioglitazone] Other (See Comments)    Potential cause of DVT   Bee Venom Anaphylaxis   Klonopin [Clonazepam] Anaphylaxis   Invokana [Canagliflozin] Nausea Only    Nausea/dizzy/bad taste in mouth   Niacin Other (See Comments)    "makes his crazy"    Review of Systems As per HPI  PE:    07/19/2022    8:18 AM 04/14/2022    8:12 AM 01/12/2022    8:20 AM  Vitals with BMI  Height '5\' 11"'$  '5\' 11"'$  '5\' 11"'$   Weight 252 lbs 3 oz 245 lbs 241 lbs 3 oz  BMI 35.19 [id number]  Systolic [id number]  Diastolic 80 75 71  Pulse 68 71 69     Physical Exam  Gen: Alert, well appearing.  Patient is oriented to person, place, time, and situation. AFFECT: pleasant, lucid thought and speech. Foot exam - ; no swelling, tenderness or skin or vascular lesions. Color and temperature is normal. Sensation is intact. Peripheral pulses are palpable. Toenails are  normal.  LABS:  Last CBC Lab Results  Component Value Date   WBC 5.8 04/14/2022   HGB 14.6 04/14/2022   HCT 45.3 04/14/2022   MCV 93.9 04/14/2022   MCH 31.1 01/26/2021   RDW 13.7 04/14/2022   PLT 387.0 [id number]   Last metabolic panel Lab Results  Component Value Date   GLUCOSE 165 (H) 04/14/2022   NA 138 04/14/2022   K 4.5 04/14/2022   CL 100 04/14/2022   CO2 28 04/14/2022   BUN 24 (H) 04/14/2022   CREATININE 1.19 04/14/2022   GFRNONAA >60 01/26/2021   CALCIUM 10.1 04/14/2022   PHOS 4.0 06/23/2015   PROT 7.0 10/28/2021   ALBUMIN 4.4 10/28/2021   BILITOT 0.4 10/28/2021   ALKPHOS 35 (L) 10/28/2021   AST 18 10/28/2021   ALT 25 10/28/2021   ANIONGAP 9 01/26/2021   Last lipids Lab Results  Component Value Date   CHOL 139 04/14/2022   HDL 70.10 04/14/2022   LDLCALC 44 04/14/2022   LDLDIRECT 58.0 04/29/2021   TRIG 125.0 04/14/2022   CHOLHDL 2 04/14/2022   Last hemoglobin A1c Lab Results  Component Value Date   HGBA1C 6.2 (A) 07/19/2022   HGBA1C 6.2 07/19/2022   HGBA1C 6.2 07/19/2022   HGBA1C 6.2 07/19/2022   Last thyroid functions Lab Results  Component Value Date   TSH 1.51 03/15/2021   IMPRESSION AND PLAN:  #1 diabetes with nephropathy, poor control. POC Hba1c today is 8.2%. Feet exam normal today. Referred to ophthalmologist for annual retinopathy screening. Increase metformin XR back to 1000 mg twice a day and to continue other current therapies--> Trulicity 4.5 mg weekly, Jardiance 25 mg a day. He will try to significantly improve his diet and activity levels. Electrolytes and creatinine today.  2.  Hypertension, well-controlled on losartan one half of 100 mg tab daily, 12.5 mg HCTZ daily. Electrolytes and creatinine today.  3.  Chronic renal insufficiency stage III. Avoid NSAIDs. Electrolytes and creatinine today.  4.  Chronic anxiety, doing well on Valium 10 mg twice a day and fluoxetine 40 mg a day, duloxetine 30 mg a day.  An After  Visit Summary was printed and given to the patient.  FOLLOW UP: No follow-ups on file.  Signed:  Crissie Sickles, MD           07/19/2022

## 2022-08-11 ENCOUNTER — Other Ambulatory Visit: Payer: Self-pay | Admitting: Cardiovascular Disease

## 2022-09-22 ENCOUNTER — Other Ambulatory Visit (HOSPITAL_BASED_OUTPATIENT_CLINIC_OR_DEPARTMENT_OTHER): Payer: Self-pay

## 2022-09-23 ENCOUNTER — Other Ambulatory Visit (HOSPITAL_BASED_OUTPATIENT_CLINIC_OR_DEPARTMENT_OTHER): Payer: Self-pay

## 2022-09-24 ENCOUNTER — Other Ambulatory Visit: Payer: Self-pay | Admitting: Cardiovascular Disease

## 2022-10-16 ENCOUNTER — Other Ambulatory Visit: Payer: Self-pay | Admitting: Cardiovascular Disease

## 2022-10-16 ENCOUNTER — Other Ambulatory Visit: Payer: Self-pay | Admitting: Family Medicine

## 2022-10-16 NOTE — Telephone Encounter (Signed)
Pt has upcoming appt on 6/6

## 2022-10-19 ENCOUNTER — Encounter: Payer: Self-pay | Admitting: Family Medicine

## 2022-10-19 ENCOUNTER — Ambulatory Visit (INDEPENDENT_AMBULATORY_CARE_PROVIDER_SITE_OTHER): Payer: Medicare Other | Admitting: Family Medicine

## 2022-10-19 VITALS — BP 110/70 | HR 70 | Wt 240.8 lb

## 2022-10-19 DIAGNOSIS — Z79899 Other long term (current) drug therapy: Secondary | ICD-10-CM

## 2022-10-19 DIAGNOSIS — I10 Essential (primary) hypertension: Secondary | ICD-10-CM | POA: Diagnosis not present

## 2022-10-19 DIAGNOSIS — E7800 Pure hypercholesterolemia, unspecified: Secondary | ICD-10-CM

## 2022-10-19 DIAGNOSIS — Z7984 Long term (current) use of oral hypoglycemic drugs: Secondary | ICD-10-CM | POA: Diagnosis not present

## 2022-10-19 DIAGNOSIS — E119 Type 2 diabetes mellitus without complications: Secondary | ICD-10-CM | POA: Diagnosis not present

## 2022-10-19 DIAGNOSIS — F411 Generalized anxiety disorder: Secondary | ICD-10-CM

## 2022-10-19 DIAGNOSIS — E1121 Type 2 diabetes mellitus with diabetic nephropathy: Secondary | ICD-10-CM | POA: Diagnosis not present

## 2022-10-19 LAB — BASIC METABOLIC PANEL
BUN: 21 mg/dL (ref 6–23)
CO2: 29 mEq/L (ref 19–32)
Calcium: 10.1 mg/dL (ref 8.4–10.5)
Chloride: 97 mEq/L (ref 96–112)
Creatinine, Ser: 1.35 mg/dL (ref 0.40–1.50)
GFR: 58.77 mL/min — ABNORMAL LOW (ref >60.00)
Glucose, Bld: 175 mg/dL — ABNORMAL HIGH (ref 70–99)
Potassium: 4.2 mEq/L (ref 3.5–5.1)
Sodium: 138 mEq/L (ref 135–145)

## 2022-10-19 LAB — POCT GLYCOSYLATED HEMOGLOBIN (HGB A1C)
HbA1c POC (<> result, manual entry): 8 % (ref 4.0–5.6)
HbA1c, POC (controlled diabetic range): 8 % — AB (ref 0.0–7.0)
HbA1c, POC (prediabetic range): 8 % — AB (ref 5.7–6.4)
Hemoglobin A1C: 8 % — AB (ref 4.0–5.6)

## 2022-10-19 LAB — MICROALBUMIN / CREATININE URINE RATIO
Creatinine,U: 77.1 mg/dL
Microalb Creat Ratio: 0.9 mg/g (ref 0.0–30.0)
Microalb, Ur: <0.7 mg/dL (ref 0.0–1.9)

## 2022-10-19 LAB — LIPID PANEL
Cholesterol: 147 mg/dL (ref 0–200)
HDL: 58.8 mg/dL (ref >39.00)
LDL Cholesterol: 51 mg/dL (ref 0–99)
NonHDL: 88.45
Total CHOL/HDL Ratio: 3
Triglycerides: 185 mg/dL — ABNORMAL HIGH (ref 0.0–149.0)
VLDL: 37 mg/dL (ref 0.0–40.0)

## 2022-10-19 MED ORDER — ACCU-CHEK GUIDE VI STRP: ORAL_STRIP | 5 refills | Status: DC

## 2022-10-19 MED ORDER — TRULICITY 4.5 MG/0.5ML ~~LOC~~ SOAJ: 4.5000 mg | SUBCUTANEOUS | 3 refills | Status: DC

## 2022-10-19 MED ORDER — CLOPIDOGREL BISULFATE 75 MG PO TABS: ORAL_TABLET | ORAL | 0 refills | Status: DC

## 2022-10-19 MED ORDER — DIAZEPAM 10 MG PO TABS: 10.0000 mg | ORAL_TABLET | Freq: Two times a day (BID) | ORAL | 1 refills | Status: DC

## 2022-10-19 NOTE — Progress Notes (Signed)
OFFICE VISIT  10/19/2022  CC:  Chief Complaint  Patient presents with   Follow-up    3 month follow up. Has been having issues getting Trulicity from the pharmacy.    Patient is a 58 y.o. male who presents for 31-month follow-up diabetes, hypertension, and chronic renal insufficiency. A/P as of last visit: "#1 diabetes with nephropathy, poor control. POC Hba1c today is 8.2%. Feet exam normal today. Referred to ophthalmologist for annual retinopathy screening. Increase metformin XR back to 1000 mg twice a day and to continue other current therapies--> Trulicity 4.5 mg weekly, Jardiance 25 mg a day. He will try to significantly improve his diet and activity levels. Electrolytes and creatinine today.   2.  Hypertension, well-controlled on losartan one half of 100 mg tab daily, 12.5 mg HCTZ daily. Electrolytes and creatinine today.   3.  Chronic renal insufficiency stage III. Avoid NSAIDs. Electrolytes and creatinine today.   4.  Chronic anxiety, doing well on Valium 10 mg twice a day and fluoxetine 40 mg a day, duloxetine 30 mg a day.  INTERIM HX: Feeling very well. Anxiety level well-controlled, mood is good. No burning, tingling, or numbness in feet. Home blood pressure monitoring normal.   PMP AWARE reviewed today: most recent rx for diazepam was filled 10/11/22, # 180, rx by me. No red flags.   Past Medical History:  Diagnosis Date   ANEMIA, PERNICIOUS 04/03/2007   Anxiety and depression    Arthritis    "left hand" (06/26/2016)   Chronic lower back pain    grd I degen spondylolisthesis at L4-5 w/associated synovial cyst at facet at this level +underlying severe epidural lipomatosis L5-S1 up to L4-5--plan for surg w/Dr. Shon Baton 12/2020.   Chronic renal insufficiency, stage 3 (moderate) (H[facility]) 2022   GFR around 60   Colon cancer screening 10/27/2016   Cologuard NEG-->rpt 3 yrs   CORONARY ARTERY DISEASE 12/05/2006   DIABETES MELLITUS, TYPE II dx'd 2001   DVT (deep venous  thrombosis) (H[facility]) 2016   BLE   GERD (gastroesophageal reflux disease)    History of balanitis    x 2. Holding off on circ as of 05/2020 urol f/u   History of blood transfusion 2001   "when I had my hand OR"   History of gout    History of kidney stones    HYPERCHOLESTEROLEMIA 07/17/2007   HYPERTENSION 12/05/2006   Learning disability    Migraine    "1-2/month" (06/26/2016)   Myocardial infarction ([facility]) ~ 2003   Obesity, Class II, BMI 35-39.9    OSA on CPAP    Pt cannot recall setting clearly but thinks it is 5 cm h20.     PE (pulmonary embolism) 2016   Peripheral vascular disease ([facility])    Simple renal cyst    X 2 on R kidney (one 5 cm and one 2 cm)   Stroke ([facility]) ~ 2006   denies residual on 06/26/2016   TIA (transient ischemic attack) ?06/25/2016   2018 and 01/2021 (MRI brain, MR angio head and neck neg 2022)   VISUAL ACUITY, DECREASED, RIGHT EYE 02/19/2009    Past Surgical History:  Procedure Laterality Date   CARDIAC CATHETERIZATION  1990s; 2012; 06/2016   06/2016 distal LAD DES stent.  EF 55-65%   CARDIOVASCULAR STRESS TEST  06/2016   Echo stress test->"inconclusive"-->cath was done after this   CATARACT EXTRACTION W/ INTRAOCULAR LENS IMPLANT Right 2017   CORONARY PRESSURE/FFR STUDY N/A 06/27/2016   Procedure: Intravascular Pressure Wire/FFR Study;  Surgeon: Marykay Lex, MD;  [facility];  Service: Cardiovascular;  Laterality: N/A;   CORONARY STENT INTERVENTION N/A 06/27/2016   Procedure: Coronary Stent Intervention;  Surgeon: Marykay Lex, MD;  Location: [facility] INV[facility] CV LAB;  Service: Cardiovascular;  Laterality: N/A;   EYE SURGERY Right    Cataract   INGUINAL HERNIA REPAIR Bilateral 1990s   LEFT HEART CATH AND CORONARY ANGIOGRAPHY N/A 06/27/2016   Procedure: Left Heart Cath and Coronary Angiography;  Surgeon: Marykay Lex, MD;  Location: [facility] INVASIVE CV LAB;  Service: Cardiovascular;  Laterality: N/A;   LUMBAR LAMINECTOMY/DECOMPRESSION MICRODISCECTOMY  Left 01/05/2021   Procedure: Left L4-5 Gill Decompression;  Surgeon: Venita Lick, MD;  Location: [facility];  Service: Orthopedics;  Laterality: Left;   REATTACHMENT HAND Left ~ 2001   w/carpal tunnel release   UMBILICAL HERNIA REPAIR  1990s   w/IHR    Outpatient Medications Prior to Visit  Medication Sig Dispense Refill   Accu-Chek Softclix Lancets lancets Contact office to schedule with new provider 100 each 2   bromocriptine (PARLODEL) 2.5 MG tablet Take 1.25 mg by mouth daily.     cyanocobalamin (VITAMIN B12) 1000 MCG tablet Take 1 tablet (1,000 mcg total) by mouth daily. 90 tablet 1   DULoxetine (CYMBALTA) 30 MG capsule Take 1 capsule (30 mg total) by mouth daily. 90 capsule 1   empagliflozin (JARDIANCE) 25 MG TABS tablet Take 1 tablet (25 mg total) by mouth daily. 90 tablet 1   fenofibrate (TRICOR) 145 MG tablet Take 1 tablet (145 mg total) by mouth daily. 90 tablet 1   fluconazole (DIFLUCAN) 150 MG tablet TAKE 1 TABLET BY MOUTH EVERY 7 DAYS 4 tablet 1   FLUoxetine (PROZAC) 40 MG capsule Take 1 capsule (40 mg total) by mouth daily. 90 capsule 1   fluticasone (FLONASE) 50 MCG/ACT nasal spray USE ONE SPRAY IN EACH NOSTRIL TWICE A DAY 48 g 3   gabapentin (NEURONTIN) 300 MG capsule Take 1 capsule (300 mg total) by mouth 2 (two) times daily. 180 capsule 1   hydrochlorothiazide (HYDRODIURIL) 12.5 MG tablet TAKE 1 TABLET BY MOUTH EVERY DAY WITH THE LOSARTAN 90 tablet 1   losartan (COZAAR) 100 MG tablet Take 0.5 tablets (50 mg total) by mouth daily. 45 tablet 3   metFORMIN (GLUCOPHAGE-XR) 500 MG 24 hr tablet Take 2 tablets (1,000 mg total) by mouth 2 (two) times daily. 360 tablet 1   methocarbamol (ROBAXIN) 500 MG tablet Take 1 tablet (500 mg total) by mouth 2 (two) times daily. 90 tablet 1   nystatin cream (MYCOSTATIN) Apply 1 application topically 2 (two) times daily. 30 g 1   rivaroxaban (XARELTO) 20 MG TABS tablet TAKE 1 TABLET BY MOUTH EVERY DAY BEFORE SUPPER 90 tablet 3   rosuvastatin  (CRESTOR) 20 MG tablet Take 1 tablet (20 mg total) by mouth daily. Take 1 tablet (20 mg total) by mouth daily. Please call [phone] to schedule an appointment for future refills. Final attempt. Thank you 90 tablet 3   clopidogrel (PLAVIX) 75 MG tablet TAKE 1 TABLET(75 MG) BY MOUTH DAILY 15 tablet 0   diazepam (VALIUM) 10 MG tablet Take 1 tablet (10 mg total) by mouth in the morning and at bedtime. 180 tablet 1   Dulaglutide (TRULICITY) 4.5 MG/0.5ML SOPN Inject 4.5 mg as directed once a week. 6 mL 3   EPINEPHrine 0.3 mg/0.3 mL IJ SOAJ injection Inject 0.3 mg into the muscle as needed for anaphylaxis. (Patient not taking: Reported  on 07/19/2022) 2 each 1   nitroGLYCERIN (NITROSTAT) 0.4 MG SL tablet Place 1 tablet (0.4 mg total) under the tongue every 5 (five) minutes as needed for chest pain. (Patient not taking: Reported on 07/19/2022) 25 tablet 3   ondansetron (ZOFRAN) 4 MG tablet Take 1 tablet (4 mg total) by mouth every 8 (eight) hours as needed for nausea or vomiting. (Patient not taking: Reported on 07/19/2022) 20 tablet 0   glucose blood (ACCU-CHEK GUIDE) test strip Use to check glucose 1-2 times a day. 100 each 5   No facility-administered medications prior to visit.    Allergies  Allergen Reactions   Actos [Pioglitazone] Other (See Comments)    Potential cause of DVT   Bee Venom Anaphylaxis   Klonopin [Clonazepam] Anaphylaxis   Invokana [Canagliflozin] Nausea Only    Nausea/dizzy/bad taste in mouth   Niacin Other (See Comments)    "makes his crazy"    Review of Systems As per HPI  PE:    10/19/2022    8:11 AM 07/19/2022    8:18 AM 04/14/2022    8:12 AM  Vitals with BMI  Height  5\' 11"  5\' 11"   Weight 240 lbs 13 oz 252 lbs 3 oz 245 lbs  BMI  35.19 34.19  Systolic 110 123 161  Diastolic 70 80 75  Pulse 70 68 71     Physical Exam  Gen: Alert, well appearing.  Patient is oriented to person, place, time, and situation. AFFECT: pleasant, lucid thought and speech. CV: RRR, no  m/r/g.   LUNGS: CTA bilat, nonlabored resps, good aeration in all lung fields. EXT: no clubbing or cyanosis.  no edema.    LABS:  Last CBC Lab Results  Component Value Date   WBC 5.8 04/14/2022   HGB 14.6 04/14/2022   HCT 45.3 04/14/2022   MCV 93.9 04/14/2022   MCH 31.1 01/26/2021   RDW 13.7 04/14/2022   PLT 387.0 04/14/2022   Last metabolic panel Lab Results  Component Value Date   GLUCOSE 206 (H) 07/19/2022   NA 139 07/19/2022   K 4.4 07/19/2022   CL 99 07/19/2022   CO2 29 07/19/2022   BUN 17 07/19/2022   CREATININE 1.20 07/19/2022   GFRNONAA >60 01/26/2021   CALCIUM 10.3 07/19/2022   PHOS 4.0 06/23/2015   PROT 6.9 07/19/2022   ALBUMIN 4.2 07/19/2022   BILITOT 0.5 07/19/2022   ALKPHOS 45 07/19/2022   AST 21 07/19/2022   ALT 21 07/19/2022   ANIONGAP 9 01/26/2021   Last lipids Lab Results  Component Value Date   CHOL 139 04/14/2022   HDL 70.10 04/14/2022   LDLCALC 44 04/14/2022   LDLDIRECT 58.0 04/29/2021   TRIG 125.0 04/14/2022   CHOLHDL 2 04/14/2022   Last hemoglobin A1c Lab Results  Component Value Date   HGBA1C 8.0 (A) 10/19/2022   HGBA1C 8.0 10/19/2022   HGBA1C 8.0 (A) 10/19/2022   HGBA1C 8.0 (A) 10/19/2022   Last thyroid functions Lab Results  Component Value Date   TSH 1.51 03/15/2021   Last vitamin B12 and Folate Lab Results  Component Value Date   VITAMINB12 446 04/29/2021   FOLATE 13.6 06/12/2008   IMPRESSION AND PLAN:  #1  diabetes with nephropathy, poor control. POC Hba1c today is 8.0%. Feet exam normal today. Continue metformin XR  1000 mg twice a day and to continue other current therapies--> Trulicity 4.5 mg weekly, Jardiance 25 mg a day. He will try to significantly improve his diet and activity levels.  He declined to start any insulin today. Electrolytes and creatinine today.  2.  Hypertension, well-controlled on losartan one half of 100 mg tab daily, 12.5 mg HCTZ daily. Electrolytes and creatinine today.   3.  Chronic  renal insufficiency stage III. Avoid NSAIDs. Electrolytes and creatinine today.   4.  Chronic anxiety, doing well on Valium 10 mg twice a day and fluoxetine 40 mg a day, duloxetine 30 mg a day. Valium 10 mg, 1 twice daily, #180, refill x 1 sent in today.  An After Visit Summary was printed and given to the patient.  FOLLOW UP: Return in about 3 months (around 01/19/2023) for routine chronic illness f/u.  Signed:  Santiago Bumpers, MD           10/19/2022

## 2022-11-17 ENCOUNTER — Other Ambulatory Visit: Payer: Self-pay | Admitting: Family Medicine

## 2022-12-15 ENCOUNTER — Telehealth: Payer: Self-pay | Admitting: Family Medicine

## 2022-12-15 DIAGNOSIS — E1121 Type 2 diabetes mellitus with diabetic nephropathy: Secondary | ICD-10-CM

## 2022-12-15 MED ORDER — ACCU-CHEK GUIDE VI STRP
ORAL_STRIP | 5 refills | Status: AC
Start: 2022-12-15 — End: ?

## 2022-12-15 NOTE — Telephone Encounter (Addendum)
Spoke with pharmacist regarding test strips, patient's insurance Medicare will no longer cover directions stating "use to check glucose 1-2 times daily ", it has to be one specific number. New rx sent   Example: 2 times daily

## 2022-12-15 NOTE — Telephone Encounter (Signed)
[facility] in [place] is needing clarification on a prescription for test strips. Xavier [name] can be reached at [phone]

## 2022-12-28 ENCOUNTER — Encounter (INDEPENDENT_AMBULATORY_CARE_PROVIDER_SITE_OTHER): Payer: Self-pay

## 2023-01-23 ENCOUNTER — Ambulatory Visit: Payer: Medicare Other | Admitting: Family Medicine

## 2023-01-23 NOTE — Progress Notes (Deleted)
OFFICE VISIT  01/23/2023  CC: No chief complaint on file.   Patient is a 57 y.o. male who presents for 64-month follow-up diabetes, hypertension, GAD, and chronic renal insufficiency. A/P as of last visit: "1  diabetes with nephropathy, poor control. POC Hba1c today is 8.0%. Feet exam normal today. Continue metformin XR  1000 mg twice a day and to continue other current therapies--> Trulicity 4.5 mg weekly, Jardiance 25 mg a day. He will try to significantly improve his diet and activity levels.  He declined to start any insulin today. Electrolytes and creatinine today.   2.  Hypertension, well-controlled on losartan one half of 100 mg tab daily, 12.5 mg HCTZ daily. Electrolytes and creatinine today.   3.  Chronic renal insufficiency stage III. Avoid NSAIDs. Electrolytes and creatinine today.   4.  Chronic anxiety, doing well on Valium 10 mg twice a day and fluoxetine 40 mg a day, duloxetine 30 mg a day. Valium 10 mg, 1 twice daily, #180, refill x 1 sent in today."  INTERIM HX: ***   Past Medical History:  Diagnosis Date   ANEMIA, PERNICIOUS 04/03/2007   Anxiety and depression    Arthritis    "left hand" (06/26/2016)   Chronic lower back pain    grd I degen spondylolisthesis at L4-5 w/associated synovial cyst at facet at this level +underlying severe epidural lipomatosis L5-S1 up to L4-5--plan for surg w/Dr. Shon Baton 12/2020.   Chronic renal insufficiency, stage 3 (moderate) (HCC) 2022   GFR around 60   Colon cancer screening 10/27/2016   Cologuard NEG-->rpt 3 yrs   CORONARY ARTERY DISEASE 12/05/2006   DIABETES MELLITUS, TYPE II dx'd 2001   DVT (deep venous thrombosis) (HCC) 2016   BLE   GERD (gastroesophageal reflux disease)    History of balanitis    x 2. Holding off on circ as of 05/2020 urol f/u   History of blood transfusion 2001   "when I had my hand OR"   History of gout    History of kidney stones    HYPERCHOLESTEROLEMIA 07/17/2007   HYPERTENSION 12/05/2006    Learning disability    Migraine    "1-2/month" (06/26/2016)   Myocardial infarction (HCC) ~ 2003   Obesity, Class II, BMI 35-39.9    OSA on CPAP    Pt cannot recall setting clearly but thinks it is 5 cm h20.     PE (pulmonary embolism) 2016   Peripheral vascular disease (HCC)    Simple renal cyst    X 2 on R kidney (one 5 cm and one 2 cm)   Stroke (HCC) ~ 2006   denies residual on 06/26/2016   TIA (transient ischemic attack) ?06/25/2016   2018 and 01/2021 (MRI brain, MR angio head and neck neg 2022)   VISUAL ACUITY, DECREASED, RIGHT EYE 02/19/2009    Past Surgical History:  Procedure Laterality Date   CARDIAC CATHETERIZATION  1990s; 2012; 06/2016   06/2016 distal LAD DES stent.  EF 55-65%   CARDIOVASCULAR STRESS TEST  06/2016   Echo stress test->"inconclusive"-->cath was done after this   CATARACT EXTRACTION W/ INTRAOCULAR LENS IMPLANT Right 2017   CORONARY PRESSURE/FFR STUDY N/A 06/27/2016   Procedure: Intravascular Pressure Wire/FFR Study;  Surgeon: Marykay Lex, MD;  Location: [facility] INVASIVE CV LAB;  Service: Cardiovascular;  Laterality: N/A;   CORONARY STENT INTERVENTION N/A 06/27/2016   Procedure: Coronary Stent Intervention;  Surgeon: Marykay Lex, MD;  Location: [facility] INVASIVE CV LAB;  Service: Cardiovascular;  Laterality: N/A;  EYE SURGERY Right    Cataract   INGUINAL HERNIA REPAIR Bilateral 1990s   LEFT HEART CATH AND CORONARY ANGIOGRAPHY N/A 06/27/2016   Procedure: Left Heart Cath and Coronary Angiography;  Surgeon: Marykay Lex, MD;  Location: [facility] INVASIVE CV LAB;  Service: Cardiovascular;  Laterality: N/A;   LUMBAR LAMINECTOMY/DECOMPRESSION MICRODISCECTOMY Left 01/05/2021   Procedure: Left L4-5 Gill Decompression;  Surgeon: Venita Lick, MD;  Location: [facility] OR;  Service: Orthopedics;  Laterality: Left;   REATTACHMENT HAND Left ~ 2001   w/carpal tunnel release   UMBILICAL HERNIA REPAIR  1990s   w/IHR    Outpatient Medications Prior to Visit  Medication Sig Dispense  Refill   Accu-Chek Softclix Lancets lancets Contact office to schedule with new provider 100 each 2   bromocriptine (PARLODEL) 2.5 MG tablet Take 1.25 mg by mouth daily.     clopidogrel (PLAVIX) 75 MG tablet TAKE 1 TABLET(75 MG) BY MOUTH DAILY 30 tablet 1   cyanocobalamin (VITAMIN B12) 1000 MCG tablet Take 1 tablet (1,000 mcg total) by mouth daily. 90 tablet 1   diazepam (VALIUM) 10 MG tablet Take 1 tablet (10 mg total) by mouth in the morning and at bedtime. 180 tablet 1   Dulaglutide (TRULICITY) 4.5 MG/0.5ML SOPN Inject 4.5 mg as directed once a week. 6 mL 3   DULoxetine (CYMBALTA) 30 MG capsule Take 1 capsule (30 mg total) by mouth daily. 90 capsule 1   empagliflozin (JARDIANCE) 25 MG TABS tablet Take 1 tablet (25 mg total) by mouth daily. 90 tablet 1   EPINEPHrine 0.3 mg/0.3 mL IJ SOAJ injection Inject 0.3 mg into the muscle as needed for anaphylaxis. (Patient not taking: Reported on 07/19/2022) 2 each 1   fenofibrate (TRICOR) 145 MG tablet Take 1 tablet (145 mg total) by mouth daily. 90 tablet 1   fluconazole (DIFLUCAN) 150 MG tablet TAKE 1 TABLET BY MOUTH EVERY 7 DAYS 4 tablet 1   FLUoxetine (PROZAC) 40 MG capsule Take 1 capsule (40 mg total) by mouth daily. 90 capsule 1   fluticasone (FLONASE) 50 MCG/ACT nasal spray USE ONE SPRAY IN EACH NOSTRIL TWICE A DAY 48 g 3   gabapentin (NEURONTIN) 300 MG capsule Take 1 capsule (300 mg total) by mouth 2 (two) times daily. 180 capsule 1   glucose blood (ACCU-CHEK GUIDE) test strip Use to check glucose 2 times a day. 100 each 5   hydrochlorothiazide (HYDRODIURIL) 12.5 MG tablet TAKE 1 TABLET BY MOUTH EVERY DAY WITH THE LOSARTAN 90 tablet 1   losartan (COZAAR) 100 MG tablet Take 0.5 tablets (50 mg total) by mouth daily. 45 tablet 3   metFORMIN (GLUCOPHAGE-XR) 500 MG 24 hr tablet Take 2 tablets (1,000 mg total) by mouth 2 (two) times daily. 360 tablet 1   methocarbamol (ROBAXIN) 500 MG tablet Take 1 tablet (500 mg total) by mouth 2 (two) times daily. 90  tablet 1   nitroGLYCERIN (NITROSTAT) 0.4 MG SL tablet Place 1 tablet (0.4 mg total) under the tongue every 5 (five) minutes as needed for chest pain. (Patient not taking: Reported on 07/19/2022) 25 tablet 3   nystatin cream (MYCOSTATIN) Apply 1 application topically 2 (two) times daily. 30 g 1   ondansetron (ZOFRAN) 4 MG tablet Take 1 tablet (4 mg total) by mouth every 8 (eight) hours as needed for nausea or vomiting. (Patient not taking: Reported on 07/19/2022) 20 tablet 0   rivaroxaban (XARELTO) 20 MG TABS tablet TAKE 1 TABLET BY MOUTH EVERY DAY BEFORE SUPPER 90 tablet  3   rosuvastatin (CRESTOR) 20 MG tablet Take 1 tablet (20 mg total) by mouth daily. Take 1 tablet (20 mg total) by mouth daily. Please call [phone] to schedule an appointment for future refills. Final attempt. Thank you 90 tablet 3   No facility-administered medications prior to visit.    Allergies  Allergen Reactions   Actos [Pioglitazone] Other (See Comments)    Potential cause of DVT   Bee Venom Anaphylaxis   Klonopin [Clonazepam] Anaphylaxis   Invokana [Canagliflozin] Nausea Only    Nausea/dizzy/bad taste in mouth   Niacin Other (See Comments)    "makes his crazy"    Review of Systems As per HPI  PE:    10/19/2022    8:11 AM 07/19/2022    8:18 AM 04/14/2022    8:12 AM  Vitals with BMI  Height  5\' 11"  5\' 11"   Weight 240 lbs 13 oz 252 lbs 3 oz 245 lbs  BMI  35.19 34.19  Systolic 110 123 237  Diastolic 70 80 75  Pulse 70 68 71     Physical Exam  ***  LABS:  Last CBC Lab Results  Component Value Date   WBC 5.8 04/14/2022   HGB 14.6 04/14/2022   HCT 45.3 04/14/2022   MCV 93.9 04/14/2022   MCH 31.1 01/26/2021   RDW 13.7 04/14/2022   PLT 387.0 04/14/2022   Last metabolic panel Lab Results  Component Value Date   GLUCOSE 175 (H) 10/19/2022   NA 138 10/19/2022   K 4.2 10/19/2022   CL 97 10/19/2022   CO2 29 10/19/2022   BUN 21 10/19/2022   CREATININE 1.35 10/19/2022   GFR 58.77 (L) 10/19/2022    CALCIUM 10.1 10/19/2022   PHOS 4.0 06/23/2015   PROT 6.9 07/19/2022   ALBUMIN 4.2 07/19/2022   BILITOT 0.5 07/19/2022   ALKPHOS 45 07/19/2022   AST 21 07/19/2022   ALT 21 07/19/2022   ANIONGAP 9 01/26/2021   Last lipids Lab Results  Component Value Date   CHOL 147 10/19/2022   HDL 58.80 10/19/2022   LDLCALC 51 10/19/2022   LDLDIRECT 58.0 04/29/2021   TRIG 185.0 (H) 10/19/2022   CHOLHDL 3 10/19/2022   Last hemoglobin A1c Lab Results  Component Value Date   HGBA1C 8.0 (A) 10/19/2022   HGBA1C 8.0 10/19/2022   HGBA1C 8.0 (A) 10/19/2022   HGBA1C 8.0 (A) 10/19/2022   Last thyroid functions Lab Results  Component Value Date   TSH 1.51 03/15/2021   Last vitamin B12 and Folate Lab Results  Component Value Date   VITAMINB12 446 04/29/2021   FOLATE 13.6 06/12/2008   Lab Results  Component Value Date   PSA 0.35 04/29/2021   PSA 0.18 01/20/2020   PSA 0.17 11/04/2018   IMPRESSION AND PLAN:  No problem-specific Assessment & Plan notes found for this encounter.  Preventative health care:  Colon cancer screening : repeat Cologuard 04/2024 Prostate cancer screening: PSA today.  An After Visit Summary was printed and given to the patient.  FOLLOW UP: No follow-ups on file.  Signed:  Santiago Bumpers, MD           01/23/2023

## 2023-02-02 ENCOUNTER — Other Ambulatory Visit: Payer: Self-pay

## 2023-02-02 MED ORDER — LOSARTAN POTASSIUM 100 MG PO TABS: 50.0000 mg | ORAL_TABLET | Freq: Every day | ORAL | 0 refills | Status: DC

## 2023-02-06 ENCOUNTER — Other Ambulatory Visit: Payer: Self-pay | Admitting: Family Medicine

## 2023-02-06 NOTE — Telephone Encounter (Signed)
Pt has appt tomorrow

## 2023-02-07 ENCOUNTER — Telehealth: Payer: Self-pay | Admitting: Cardiovascular Disease

## 2023-02-07 ENCOUNTER — Encounter: Payer: Self-pay | Admitting: Family Medicine

## 2023-02-07 ENCOUNTER — Ambulatory Visit (INDEPENDENT_AMBULATORY_CARE_PROVIDER_SITE_OTHER): Payer: Medicare Other | Admitting: Family Medicine

## 2023-02-07 VITALS — BP 134/84 | HR 66 | Temp 97.7°F | Ht 71.0 in | Wt 246.6 lb

## 2023-02-07 DIAGNOSIS — E1121 Type 2 diabetes mellitus with diabetic nephropathy: Secondary | ICD-10-CM | POA: Diagnosis not present

## 2023-02-07 DIAGNOSIS — I10 Essential (primary) hypertension: Secondary | ICD-10-CM

## 2023-02-07 DIAGNOSIS — Z23 Encounter for immunization: Secondary | ICD-10-CM

## 2023-02-07 DIAGNOSIS — N1831 Chronic kidney disease, stage 3a: Secondary | ICD-10-CM

## 2023-02-07 DIAGNOSIS — E7800 Pure hypercholesterolemia, unspecified: Secondary | ICD-10-CM | POA: Diagnosis not present

## 2023-02-07 DIAGNOSIS — Z7985 Long-term (current) use of injectable non-insulin antidiabetic drugs: Secondary | ICD-10-CM | POA: Diagnosis not present

## 2023-02-07 LAB — POCT GLYCOSYLATED HEMOGLOBIN (HGB A1C)
HbA1c POC (<> result, manual entry): 7.8 % (ref 4.0–5.6)
HbA1c, POC (controlled diabetic range): 7.8 % — AB (ref 0.0–7.0)
HbA1c, POC (prediabetic range): 7.8 % — AB (ref 5.7–6.4)
Hemoglobin A1C: 7.8 % — AB (ref 4.0–5.6)

## 2023-02-07 LAB — COMPREHENSIVE METABOLIC PANEL
ALT: 20 U/L (ref 0–53)
AST: 15 U/L (ref 0–37)
Albumin: 4.2 g/dL (ref 3.5–5.2)
Alkaline Phosphatase: 37 U/L — ABNORMAL LOW (ref 39–117)
BUN: 12 mg/dL (ref 6–23)
CO2: 27 mEq/L (ref 19–32)
Calcium: 9.7 mg/dL (ref 8.4–10.5)
Chloride: 102 mEq/L (ref 96–112)
Creatinine, Ser: 1.16 mg/dL (ref 0.40–1.50)
GFR: 70.36 mL/min (ref >60.00)
Glucose, Bld: 133 mg/dL — ABNORMAL HIGH (ref 70–99)
Potassium: 3.9 mEq/L (ref 3.5–5.1)
Sodium: 138 mEq/L (ref 135–145)
Total Bilirubin: 0.5 mg/dL (ref 0.2–1.2)
Total Protein: 6.7 g/dL (ref 6.0–8.3)

## 2023-02-07 MED ORDER — FLUOXETINE HCL 40 MG PO CAPS: 40.0000 mg | ORAL_CAPSULE | Freq: Every day | ORAL | 1 refills | Status: DC

## 2023-02-07 MED ORDER — FENOFIBRATE 145 MG PO TABS: 145.0000 mg | ORAL_TABLET | Freq: Every day | ORAL | 1 refills | Status: DC

## 2023-02-07 MED ORDER — METHOCARBAMOL 500 MG PO TABS: 500.0000 mg | ORAL_TABLET | Freq: Two times a day (BID) | ORAL | 1 refills | Status: DC

## 2023-02-07 MED ORDER — HYDROCHLOROTHIAZIDE 12.5 MG PO TABS: ORAL_TABLET | ORAL | 1 refills | Status: DC

## 2023-02-07 MED ORDER — INSULIN PEN NEEDLE 31G X 8 MM MISC: 5 refills | Status: DC

## 2023-02-07 MED ORDER — LOSARTAN POTASSIUM 100 MG PO TABS: 50.0000 mg | ORAL_TABLET | Freq: Every day | ORAL | 1 refills | Status: DC

## 2023-02-07 MED ORDER — NITROGLYCERIN 0.4 MG SL SUBL: 0.4000 mg | SUBLINGUAL_TABLET | SUBLINGUAL | 3 refills | Status: DC | PRN

## 2023-02-07 MED ORDER — DULOXETINE HCL 30 MG PO CPEP: 30.0000 mg | ORAL_CAPSULE | Freq: Every day | ORAL | 1 refills | Status: DC

## 2023-02-07 MED ORDER — CLOPIDOGREL BISULFATE 75 MG PO TABS: ORAL_TABLET | ORAL | 1 refills | Status: DC

## 2023-02-07 MED ORDER — TOUJEO SOLOSTAR 300 UNIT/ML ~~LOC~~ SOPN: PEN_INJECTOR | SUBCUTANEOUS | 1 refills | Status: DC

## 2023-02-07 MED ORDER — GABAPENTIN 300 MG PO CAPS: 300.0000 mg | ORAL_CAPSULE | Freq: Two times a day (BID) | ORAL | 1 refills | Status: DC

## 2023-02-07 MED ORDER — EMPAGLIFLOZIN 25 MG PO TABS: 25.0000 mg | ORAL_TABLET | Freq: Every day | ORAL | 1 refills | Status: DC

## 2023-02-07 MED ORDER — VITAMIN B-12 1000 MCG PO TABS: 1000.0000 ug | ORAL_TABLET | Freq: Every day | ORAL | 1 refills | Status: DC

## 2023-02-07 NOTE — Telephone Encounter (Signed)
*  STAT* If patient is at the pharmacy, call can be transferred to refill team.   1. Which medications need to be refilled? (please list name of each medication and dose if known) clopidogrel (PLAVIX) 75 MG tablet rivaroxaban (XARELTO) 20 MG TABS tablet nitroGLYCERIN (NITROSTAT) 0.4 MG SL tablet  2. Which pharmacy/location (including street and city if local pharmacy) is medication to be sent to? [id number] - [place], [place] - [address] [place] [address] [place] [address]  3. Do they need a 30 day or 90 day supply?  90 day supply

## 2023-02-07 NOTE — Progress Notes (Signed)
OFFICE VISIT  02/07/2023  CC:  Chief Complaint  Patient presents with   Medical Management of Chronic Issues    Pt is fasting    Patient is a 57 y.o. male who presents accompanied by his wife Misty Stanley for 93-month follow-up diabetes, hypertension, and chronic renal insufficiency. A/P as of last visit: "1  diabetes with nephropathy, poor control. POC Hba1c today is 8.0%. Feet exam normal today. Continue metformin XR  1000 mg twice a day and to continue other current therapies--> Trulicity 4.5 mg weekly, Jardiance 25 mg a day. He will try to significantly improve his diet and activity levels.  He declined to start any insulin today. Electrolytes and creatinine today.   2.  Hypertension, well-controlled on losartan one half of 100 mg tab daily, 12.5 mg HCTZ daily. Electrolytes and creatinine today.   3.  Chronic renal insufficiency stage III. Avoid NSAIDs. Electrolytes and creatinine today.   4.  Chronic anxiety, doing well on Valium 10 mg twice a day and fluoxetine 40 mg a day, duloxetine 30 mg a day. Valium 10 mg, 1 twice daily, #180, refill x 1 sent in today."  INTERIM HX: Xavier [name] feels well. He does tend to drink too much juice, soda, and milk.  Also portion sizes are too big.   He has continuous glucose monitoring and notes that glucoses are in the 160s typically in the middle of the day and then go up into the 180s to 200 range later in the afternoon and evening. No burning, tingling, or numbness in feet.  ROS as above, plus--> no fevers, no CP, no SOB, no wheezing, no cough, no dizziness, no HAs, no rashes, no melena/hematochezia.  No polyuria or polydipsia.  No myalgias or arthralgias.  No focal weakness, paresthesias, or tremors.  No acute vision or hearing abnormalities.  No dysuria or unusual/new urinary urgency or frequency.  No recent changes in lower legs. No n/v/d or abd pain.  No palpitations.    Past Medical History:  Diagnosis Date   ANEMIA, PERNICIOUS 04/03/2007    Anxiety and depression    Arthritis    "left hand" (06/26/2016)   Chronic lower back pain    grd I degen spondylolisthesis at L4-5 w/associated synovial cyst at facet at this level +underlying severe epidural lipomatosis L5-S1 up to L4-5--plan for surg w/Dr. Shon Baton 12/2020.   Chronic renal insufficiency, stage 3 (moderate) (HCC) 2022   GFR around 60   Colon cancer screening 10/27/2016   Cologuard NEG-->rpt 3 yrs   CORONARY ARTERY DISEASE 12/05/2006   DIABETES MELLITUS, TYPE II dx'd 2001   DVT (deep venous thrombosis) (HCC) 2016   BLE   GERD (gastroesophageal reflux disease)    History of balanitis    x 2. Holding off on circ as of 05/2020 urol f/u   History of blood transfusion 2001   "when I had my hand OR"   History of gout    History of kidney stones    HYPERCHOLESTEROLEMIA 07/17/2007   HYPERTENSION 12/05/2006   Learning disability    Migraine    "1-2/month" (06/26/2016)   Myocardial infarction (HCC) ~ 2003   Obesity, Class II, BMI 35-39.9    OSA on CPAP    Pt cannot recall setting clearly but thinks it is 5 cm h20.     PE (pulmonary embolism) 2016   Peripheral vascular disease (HCC)    Simple renal cyst    X 2 on R kidney (one 5 cm and one 2 cm)  Stroke [facility]) ~ 2006   denies residual on 06/26/2016   TIA (transient ischemic attack) ?06/25/2016   2018 and 01/2021 (MRI brain, MR angio head and neck neg 2022)   VISUAL ACUITY, DECREASED, RIGHT EYE 02/19/2009    Past Surgical History:  Procedure Laterality Date   CARDIAC CATHETERIZATION  1990s; 2012; 06/2016   06/2016 distal LAD DES stent.  EF 55-65%   CARDIOVASCULAR STRESS TEST  06/2016   Echo stress test->"inconclusive"-->cath was done after this   CATARACT EXTRACTION W/ INTRAOCULAR LENS IMPLANT Right 2017   CORONARY PRESSURE/FFR STUDY N/A 06/27/2016   Procedure: Intravascular Pressure Wire/FFR Study;  Surgeon: Marykay Lex, MD;  Location: [facility] INVASIVE CV LAB;  Service: Cardiovascular;  Laterality: N/A;   CORONARY STENT  INTERVENTION N/A 06/27/2016   Procedure: Coronary Stent Intervention;  Surgeon: Marykay Lex, MD;  Location: [facility] INVASIVE CV LAB;  Service: Cardiovascular;  Laterality: N/A;   EYE SURGERY Right    Cataract   INGUINAL HERNIA REPAIR Bilateral 1990s   LEFT HEART CATH AND CORONARY ANGIOGRAPHY N/A 06/27/2016   Procedure: Left Heart Cath and Coronary Angiography;  Surgeon: Marykay Lex, MD;  Location: [facility] INVASIVE CV LAB;  Service: Cardiovascular;  Laterality: N/A;   LUMBAR LAMINECTOMY/DECOMPRESSION MICRODISCECTOMY Left 01/05/2021   Procedure: Left L4-5 Gill Decompression;  Surgeon: Venita Lick, MD;  Location: [facility] OR;  Service: Orthopedics;  Laterality: Left;   REATTACHMENT HAND Left ~ 2001   w/carpal tunnel release   UMBILICAL HERNIA REPAIR  1990s   w/IHR    Outpatient Medications Prior to Visit  Medication Sig Dispense Refill   Accu-Chek Softclix Lancets lancets Contact office to schedule with new provider 100 each 2   bromocriptine (PARLODEL) 2.5 MG tablet Take 1.25 mg by mouth daily.     diazepam (VALIUM) 10 MG tablet Take 1 tablet (10 mg total) by mouth in the morning and at bedtime. 180 tablet 1   Dulaglutide (TRULICITY) 4.5 MG/0.5ML SOPN Inject 4.5 mg as directed once a week. 6 mL 3   EPINEPHrine 0.3 mg/0.3 mL IJ SOAJ injection Inject 0.3 mg into the muscle as needed for anaphylaxis. (Patient not taking: Reported on 07/19/2022) 2 each 1   fluconazole (DIFLUCAN) 150 MG tablet TAKE 1 TABLET BY MOUTH EVERY 7 DAYS 4 tablet 1   fluticasone (FLONASE) 50 MCG/ACT nasal spray USE ONE SPRAY IN EACH NOSTRIL TWICE A DAY 48 g 3   glucose blood (ACCU-CHEK GUIDE) test strip Use to check glucose 2 times a day. 100 each 5   metFORMIN (GLUCOPHAGE-XR) 500 MG 24 hr tablet Take 2 tablets (1,000 mg total) by mouth 2 (two) times daily. 360 tablet 1   nitroGLYCERIN (NITROSTAT) 0.4 MG SL tablet Place 1 tablet (0.4 mg total) under the tongue every 5 (five) minutes as needed for chest pain. (Patient not taking:  Reported on 07/19/2022) 25 tablet 3   nystatin cream (MYCOSTATIN) Apply 1 application topically 2 (two) times daily. 30 g 1   ondansetron (ZOFRAN) 4 MG tablet Take 1 tablet (4 mg total) by mouth every 8 (eight) hours as needed for nausea or vomiting. (Patient not taking: Reported on 07/19/2022) 20 tablet 0   rivaroxaban (XARELTO) 20 MG TABS tablet TAKE 1 TABLET BY MOUTH EVERY DAY BEFORE SUPPER 90 tablet 3   rosuvastatin (CRESTOR) 20 MG tablet Take 1 tablet (20 mg total) by mouth daily. Take 1 tablet (20 mg total) by mouth daily. Please call [phone] to schedule an appointment for future refills. Final attempt. Thank  you 90 tablet 3   clopidogrel (PLAVIX) 75 MG tablet TAKE 1 TABLET(75 MG) BY MOUTH DAILY 30 tablet 1   cyanocobalamin (VITAMIN B12) 1000 MCG tablet Take 1 tablet (1,000 mcg total) by mouth daily. 90 tablet 1   DULoxetine (CYMBALTA) 30 MG capsule Take 1 capsule (30 mg total) by mouth daily. 90 capsule 1   empagliflozin (JARDIANCE) 25 MG TABS tablet Take 1 tablet (25 mg total) by mouth daily. 90 tablet 1   fenofibrate (TRICOR) 145 MG tablet Take 1 tablet (145 mg total) by mouth daily. 90 tablet 1   FLUoxetine (PROZAC) 40 MG capsule Take 1 capsule (40 mg total) by mouth daily. 90 capsule 1   gabapentin (NEURONTIN) 300 MG capsule Take 1 capsule (300 mg total) by mouth 2 (two) times daily. 180 capsule 1   hydrochlorothiazide (HYDRODIURIL) 12.5 MG tablet TAKE 1 TABLET BY MOUTH EVERY DAY WITH THE LOSARTAN 90 tablet 1   losartan (COZAAR) 100 MG tablet Take 0.5 tablets (50 mg total) by mouth daily. 15 tablet 0   methocarbamol (ROBAXIN) 500 MG tablet Take 1 tablet (500 mg total) by mouth 2 (two) times daily. 90 tablet 1   No facility-administered medications prior to visit.    Allergies  Allergen Reactions   Actos [Pioglitazone] Other (See Comments)    Potential cause of DVT   Bee Venom Anaphylaxis   Klonopin [Clonazepam] Anaphylaxis   Invokana [Canagliflozin] Nausea Only     Nausea/dizzy/bad taste in mouth   Niacin Other (See Comments)    "makes his crazy"    Review of Systems As per HPI  PE:    02/07/2023    8:34 AM 10/19/2022    8:11 AM 07/19/2022    8:18 AM  Vitals with BMI  Height 5\' 11"   5\' 11"   Weight 246 lbs 10 oz 240 lbs 13 oz 252 lbs 3 oz  BMI 34.41  35.19  Systolic 134 110 213  Diastolic 84 70 80  Pulse 66 70 68     Physical Exam  Gen: Alert, well appearing.  Patient is oriented to person, place, time, and situation. AFFECT: pleasant, lucid thought and speech. No further exam today  LABS:  Last CBC Lab Results  Component Value Date   WBC 5.8 04/14/2022   HGB 14.6 04/14/2022   HCT 45.3 04/14/2022   MCV 93.9 04/14/2022   MCH 31.1 01/26/2021   RDW 13.7 04/14/2022   PLT 387.0 04/14/2022   Last metabolic panel Lab Results  Component Value Date   GLUCOSE 175 (H) 10/19/2022   NA 138 10/19/2022   K 4.2 10/19/2022   CL 97 10/19/2022   CO2 29 10/19/2022   BUN 21 10/19/2022   CREATININE 1.35 10/19/2022   GFR 58.77 (L) 10/19/2022   CALCIUM 10.1 10/19/2022   PHOS 4.0 06/23/2015   PROT 6.9 07/19/2022   ALBUMIN 4.2 07/19/2022   BILITOT 0.5 07/19/2022   ALKPHOS 45 07/19/2022   AST 21 07/19/2022   ALT 21 07/19/2022   ANIONGAP 9 01/26/2021   Last lipids Lab Results  Component Value Date   CHOL 147 10/19/2022   HDL 58.80 10/19/2022   LDLCALC 51 10/19/2022   LDLDIRECT 58.0 04/29/2021   TRIG 185.0 (H) 10/19/2022   CHOLHDL 3 10/19/2022   Last hemoglobin A1c Lab Results  Component Value Date   HGBA1C 7.8 (A) 02/07/2023   HGBA1C 7.8 02/07/2023   HGBA1C 7.8 (A) 02/07/2023   HGBA1C 7.8 (A) 02/07/2023   Last thyroid functions Lab Results  Component Value Date   TSH 1.51 03/15/2021   IMPRESSION AND PLAN:  #1 diabetes with nephropathy. Poor control. POC Hba1c today is 7.8%. Start Toujeo 6 units nightly and adjust as appropriate to keep fasting glucose in the 100-110 range. Continue metformin XR  1000 mg twice a day and  to continue other current therapies--> Trulicity 4.5 mg weekly, Jardiance 25 mg a day. He will try to significantly improve his diet as well.  #2 hypertension, well-controlled on losartan one half of 100 mg tab daily, 12.5 mg HCTZ daily. Electrolytes and creatinine today.   3.  Chronic renal insufficiency stage III. Avoid NSAIDs. Electrolytes and creatinine today.  An After Visit Summary was printed and given to the patient.  FOLLOW UP: Return in about 2 weeks (around 02/21/2023) for f/u DM.  Signed:  Santiago Bumpers, MD           02/07/2023

## 2023-02-07 NOTE — Telephone Encounter (Signed)
Pt's medication was sent to pt's pharmacy as requested. Confirmation received.  °

## 2023-02-07 NOTE — Addendum Note (Signed)
Addended by: Emi Holes D on: 02/07/2023 11:14 AM   Modules accepted: Orders

## 2023-02-21 ENCOUNTER — Encounter: Payer: Self-pay | Admitting: Family Medicine

## 2023-02-21 ENCOUNTER — Ambulatory Visit: Payer: Medicare Other | Admitting: Family Medicine

## 2023-02-21 VITALS — BP 135/86 | HR 69 | Temp 97.9°F | Ht 71.0 in | Wt 241.4 lb

## 2023-02-21 DIAGNOSIS — E1121 Type 2 diabetes mellitus with diabetic nephropathy: Secondary | ICD-10-CM | POA: Diagnosis not present

## 2023-02-21 DIAGNOSIS — Z794 Long term (current) use of insulin: Secondary | ICD-10-CM

## 2023-02-21 MED ORDER — TOUJEO SOLOSTAR 300 UNIT/ML ~~LOC~~ SOPN: PEN_INJECTOR | SUBCUTANEOUS | Status: DC

## 2023-02-21 NOTE — Patient Instructions (Addendum)
Increase your Toujeo insulin to 8 Units per day.   Your eye exam is scheduled for Tuesday October 15th at 9:15am here at our office!  It was very nice to see you today!   PLEASE NOTE:  If labs were collected or images ordered, we will inform you of  results once we have received them and reviewed. We will contact you either by echart message, or telephone call.     Please give ample time to the testing facility, and our office to run, receive and review results. Please do not call inquiring of results, even if you can see them in your chart. We will contact you as soon as we are able. If it has been over 1 week since the test was completed, and you have not yet heard from [place], then please call us.   If we ordered any referrals today, please let us know if you have not heard from their office within the next 2 weeks. You should receive a letter via MyChart confirming if the referral was approved and their office contact information to schedule.

## 2023-02-21 NOTE — Progress Notes (Signed)
OFFICE VISIT  02/21/2023  CC:  Chief Complaint  Patient presents with   Diabetes    2 wk f/u    Patient is a 57 y.o. male who presents for 2-week follow-up diabetes. A/P as of last visit: "1 diabetes with nephropathy. Poor control. POC Hba1c today is 7.8%. Start Toujeo 6 units nightly and adjust as appropriate to keep fasting glucose in the 100-110 range. Continue metformin XR  1000 mg twice a day and to continue other current therapies--> Trulicity 4.5 mg weekly, Jardiance 25 mg a day. He will try to significantly improve his diet as well.   #2 hypertension, well-controlled on losartan one half of 100 mg tab daily, 12.5 mg HCTZ daily. Electrolytes and creatinine today.   3.  Chronic renal insufficiency stage III. Avoid NSAIDs. Electrolytes and creatinine today."  INTERIM HX: Xavier [name] feels good. He has made some dietary changes: Trying to drink less sugar drinks and he is also eating smaller portion sizes. He takes 6 units of Toujeo daily.  His fasting sugars have come down into the 120s to 130s range.  Later in the day it seems to be around the same but he does still have periodic elevations in the 200 range.   Past Medical History:  Diagnosis Date   ANEMIA, PERNICIOUS 04/03/2007   Anxiety and depression    Arthritis    "left hand" (06/26/2016)   Chronic lower back pain    grd I degen spondylolisthesis at L4-5 w/associated synovial cyst at facet at this level +underlying severe epidural lipomatosis L5-S1 up to L4-5--plan for surg w/Dr. Shon Baton 12/2020.   Chronic renal insufficiency, stage 3 (moderate) (HCC) 2022   GFR around 60   Colon cancer screening 10/27/2016   Cologuard NEG-->rpt 3 yrs   CORONARY ARTERY DISEASE 12/05/2006   DIABETES MELLITUS, TYPE II dx'd 2001   DVT (deep venous thrombosis) (HCC) 2016   BLE   GERD (gastroesophageal reflux disease)    History of balanitis    x 2. Holding off on circ as of 05/2020 urol f/u   History of blood transfusion 2001   "when  I had my hand OR"   History of gout    History of kidney stones    HYPERCHOLESTEROLEMIA 07/17/2007   HYPERTENSION 12/05/2006   Learning disability    Migraine    "1-2/month" (06/26/2016)   Myocardial infarction (HCC) ~ 2003   Obesity, Class II, BMI 35-39.9    OSA on CPAP    Pt cannot recall setting clearly but thinks it is 5 cm h20.     PE (pulmonary embolism) 2016   Peripheral vascular disease (HCC)    Simple renal cyst    X 2 on R kidney (one 5 cm and one 2 cm)   Stroke (HCC) ~ 2006   denies residual on 06/26/2016   TIA (transient ischemic attack) ?06/25/2016   2018 and 01/2021 (MRI brain, MR angio head and neck neg 2022)   VISUAL ACUITY, DECREASED, RIGHT EYE 02/19/2009    Past Surgical History:  Procedure Laterality Date   CARDIAC CATHETERIZATION  1990s; 2012; 06/2016   06/2016 distal LAD DES stent.  EF 55-65%   CARDIOVASCULAR STRESS TEST  06/2016   Echo stress test->"inconclusive"-->cath was done after this   CATARACT EXTRACTION W/ INTRAOCULAR LENS IMPLANT Right 2017   CORONARY PRESSURE/FFR STUDY N/A 06/27/2016   Procedure: Intravascular Pressure Wire/FFR Study;  Surgeon: Marykay Lex, MD;  Location: [facility] INVASIVE CV LAB;  Service: Cardiovascular;  Laterality: N/A;  CORONARY STENT INTERVENTION N/A 06/27/2016   Procedure: Coronary Stent Intervention;  Surgeon: Marykay Lex, MD;  Location: [facility] INVASIVE CV LAB;  Service: Cardiovascular;  Laterality: N/A;   EYE SURGERY Right    Cataract   INGUINAL HERNIA REPAIR Bilateral 1990s   LEFT HEART CATH AND CORONARY ANGIOGRAPHY N/A 06/27/2016   Procedure: Left Heart Cath and Coronary Angiography;  Surgeon: Marykay Lex, MD;  Location: [facility] INVASIVE CV LAB;  Service: Cardiovascular;  Laterality: N/A;   LUMBAR LAMINECTOMY/DECOMPRESSION MICRODISCECTOMY Left 01/05/2021   Procedure: Left L4-5 Gill Decompression;  Surgeon: Venita Lick, MD;  Location: [facility] OR;  Service: Orthopedics;  Laterality: Left;   REATTACHMENT HAND Left ~ 2001   w/carpal  tunnel release   UMBILICAL HERNIA REPAIR  1990s   w/IHR    Outpatient Medications Prior to Visit  Medication Sig Dispense Refill   Accu-Chek Softclix Lancets lancets Contact office to schedule with new provider 100 each 2   bromocriptine (PARLODEL) 2.5 MG tablet Take 1.25 mg by mouth daily.     clopidogrel (PLAVIX) 75 MG tablet TAKE 1 TABLET(75 MG) BY MOUTH DAILY 90 tablet 1   cyanocobalamin (VITAMIN B12) 1000 MCG tablet Take 1 tablet (1,000 mcg total) by mouth daily. 90 tablet 1   diazepam (VALIUM) 10 MG tablet Take 1 tablet (10 mg total) by mouth in the morning and at bedtime. 180 tablet 1   Dulaglutide (TRULICITY) 4.5 MG/0.5ML SOPN Inject 4.5 mg as directed once a week. 6 mL 3   DULoxetine (CYMBALTA) 30 MG capsule Take 1 capsule (30 mg total) by mouth daily. 90 capsule 1   empagliflozin (JARDIANCE) 25 MG TABS tablet Take 1 tablet (25 mg total) by mouth daily. 90 tablet 1   fenofibrate (TRICOR) 145 MG tablet Take 1 tablet (145 mg total) by mouth daily. 90 tablet 1   fluconazole (DIFLUCAN) 150 MG tablet TAKE 1 TABLET BY MOUTH EVERY 7 DAYS 4 tablet 1   FLUoxetine (PROZAC) 40 MG capsule Take 1 capsule (40 mg total) by mouth daily. 90 capsule 1   fluticasone (FLONASE) 50 MCG/ACT nasal spray USE ONE SPRAY IN EACH NOSTRIL TWICE A DAY 48 g 3   gabapentin (NEURONTIN) 300 MG capsule Take 1 capsule (300 mg total) by mouth 2 (two) times daily. 180 capsule 1   glucose blood (ACCU-CHEK GUIDE) test strip Use to check glucose 2 times a day. 100 each 5   hydrochlorothiazide (HYDRODIURIL) 12.5 MG tablet TAKE 1 TABLET BY MOUTH EVERY DAY WITH THE LOSARTAN 90 tablet 1   Insulin Pen Needle 31G X 8 MM MISC Use to administer Toujeo 100 each 5   losartan (COZAAR) 100 MG tablet Take 0.5 tablets (50 mg total) by mouth daily. 45 tablet 1   metFORMIN (GLUCOPHAGE-XR) 500 MG 24 hr tablet Take 2 tablets (1,000 mg total) by mouth 2 (two) times daily. 360 tablet 1   methocarbamol (ROBAXIN) 500 MG tablet Take 1 tablet (500  mg total) by mouth 2 (two) times daily. 90 tablet 1   nystatin cream (MYCOSTATIN) Apply 1 application topically 2 (two) times daily. 30 g 1   ondansetron (ZOFRAN) 4 MG tablet Take 1 tablet (4 mg total) by mouth every 8 (eight) hours as needed for nausea or vomiting. 20 tablet 0   rivaroxaban (XARELTO) 20 MG TABS tablet TAKE 1 TABLET BY MOUTH EVERY DAY BEFORE SUPPER 90 tablet 3   rosuvastatin (CRESTOR) 20 MG tablet Take 1 tablet (20 mg total) by mouth daily. Take 1 tablet (20  mg total) by mouth daily. Please call [phone] to schedule an appointment for future refills. Final attempt. Thank you 90 tablet 3   insulin glargine, 1 Unit Dial, (TOUJEO SOLOSTAR) 300 UNIT/ML Solostar Pen 6 units subcu nightly 1.5 mL 1   EPINEPHrine 0.3 mg/0.3 mL IJ SOAJ injection Inject 0.3 mg into the muscle as needed for anaphylaxis. (Patient not taking: Reported on 07/19/2022) 2 each 1   nitroGLYCERIN (NITROSTAT) 0.4 MG SL tablet Place 1 tablet (0.4 mg total) under the tongue every 5 (five) minutes as needed for chest pain. (Patient not taking: Reported on 02/21/2023) 25 tablet 3   No facility-administered medications prior to visit.    Allergies  Allergen Reactions   Actos [Pioglitazone] Other (See Comments)    Potential cause of DVT   Bee Venom Anaphylaxis   Klonopin [Clonazepam] Anaphylaxis   Invokana [Canagliflozin] Nausea Only    Nausea/dizzy/bad taste in mouth   Niacin Other (See Comments)    "makes his crazy"    Review of Systems As per HPI  PE:    02/21/2023    8:50 AM 02/07/2023    8:34 AM 10/19/2022    8:11 AM  Vitals with BMI  Height 5\' 11"  5\' 11"    Weight 241 lbs 6 oz 246 lbs 10 oz 240 lbs 13 oz  BMI 33.68 34.41   Systolic 135 134 425  Diastolic 86 84 70  Pulse 69 66 70     Physical Exam  Gen: Alert, well appearing.  Patient is oriented to person, place, time, and situation. AFFECT: pleasant, lucid thought and speech. CV: RRR, no m/r/g.   LUNGS: CTA bilat, nonlabored resps, good  aeration in all lung fields.   LABS:  Last CBC Lab Results  Component Value Date   WBC 5.8 04/14/2022   HGB 14.6 04/14/2022   HCT 45.3 04/14/2022   MCV 93.9 04/14/2022   MCH 31.1 01/26/2021   RDW 13.7 04/14/2022   PLT 387.0 04/14/2022   Last metabolic panel Lab Results  Component Value Date   GLUCOSE 133 (H) 02/07/2023   NA 138 02/07/2023   K 3.9 02/07/2023   CL 102 02/07/2023   CO2 27 02/07/2023   BUN 12 02/07/2023   CREATININE 1.16 02/07/2023   GFR 70.36 02/07/2023   CALCIUM 9.7 02/07/2023   PHOS 4.0 06/23/2015   PROT 6.7 02/07/2023   ALBUMIN 4.2 02/07/2023   BILITOT 0.5 02/07/2023   ALKPHOS 37 (L) 02/07/2023   AST 15 02/07/2023   ALT 20 02/07/2023   ANIONGAP 9 01/26/2021   Last lipids Lab Results  Component Value Date   CHOL 147 10/19/2022   HDL 58.80 10/19/2022   LDLCALC 51 10/19/2022   LDLDIRECT 58.0 04/29/2021   TRIG 185.0 (H) 10/19/2022   CHOLHDL 3 10/19/2022   Last hemoglobin A1c Lab Results  Component Value Date   HGBA1C 7.8 (A) 02/07/2023   HGBA1C 7.8 02/07/2023   HGBA1C 7.8 (A) 02/07/2023   HGBA1C 7.8 (A) 02/07/2023   Last thyroid functions Lab Results  Component Value Date   TSH 1.51 03/15/2021   IMPRESSION AND PLAN:  #1 diabetes with diabetic nephropathy, control is improving since starting Toujeo. Increase to 8 units daily. Continue Trulicity 4.5 mg q. 7 days, Jardiance 25 mg a day, and metformin XR 1000 mg twice daily. Next A1c around 05/09/2023.  An After Visit Summary was printed and given to the patient.  FOLLOW UP: Return in about 3 months (around 05/24/2023) for routine chronic illness f/u.  Signed:  Santiago Bumpers, MD           02/21/2023

## 2023-02-27 ENCOUNTER — Other Ambulatory Visit: Payer: Self-pay | Admitting: Family Medicine

## 2023-02-27 LAB — HM DIABETES EYE EXAM

## 2023-02-28 DIAGNOSIS — H2512 Age-related nuclear cataract, left eye: Secondary | ICD-10-CM | POA: Diagnosis not present

## 2023-02-28 DIAGNOSIS — E119 Type 2 diabetes mellitus without complications: Secondary | ICD-10-CM | POA: Diagnosis not present

## 2023-03-06 ENCOUNTER — Telehealth: Payer: Self-pay

## 2023-03-06 NOTE — Telephone Encounter (Signed)
Received DM Eye Exam. Please advise of results/ Iris results placed in PCP/CMA basket

## 2023-03-07 NOTE — Telephone Encounter (Signed)
Placed on PCP desk to review   

## 2023-04-19 ENCOUNTER — Other Ambulatory Visit: Payer: Self-pay | Admitting: Family Medicine

## 2023-04-19 MED ORDER — FLUOXETINE HCL 40 MG PO CAPS: 40.0000 mg | ORAL_CAPSULE | Freq: Every day | ORAL | 0 refills | Status: DC

## 2023-04-19 NOTE — Telephone Encounter (Signed)
Rx sent 

## 2023-04-19 NOTE — Telephone Encounter (Signed)
Prescription Request  04/19/2023  LOV: 02/21/2023 Patient has been out of medication for about 2 weeks now What is the name of the medication or equipment?   FLUoxetine (PROZAC) 40 MG capsule    Have you contacted your pharmacy to request a refill? Yes   Which pharmacy would you like this sent to?  [id number] - [place], [place] - [address] [place] [address] Phone: [phone] Fax: [phone]    Patient notified that their request is being sent to the clinical staff for review and that they should receive a response within 2 business days.   Please advise at Mobile [phone] (mobile)

## 2023-05-07 MED ORDER — DIAZEPAM 10 MG PO TABS: 10.0000 mg | ORAL_TABLET | Freq: Two times a day (BID) | ORAL | 1 refills | Status: DC

## 2023-05-18 NOTE — Progress Notes (Signed)
Office Visit    Patient Name: Xavier [name]. Date of Encounter: 06/01/2023  PCP:  [name], MD   [facility]  Cardiologist:  [name], MD  Advanced Practice Provider:  No care team member to display Electrophysiologist:  None      Chief Complaint    Xavier [name]. is a 58 y.o. male with a hx of CAD s/p PCI/DES to LAD 06/2016, hypertension, hyperlipidemia, DM2, recurrent DVT/PE has not been seen in over 2 years   Past Medical History    Past Medical History:  Diagnosis Date   ANEMIA, PERNICIOUS 04/03/2007   Anxiety and depression    Arthritis    "left hand" (06/26/2016)   Chronic lower back pain    grd I degen spondylolisthesis at L4-5 w/associated synovial cyst at facet at this level +underlying severe epidural lipomatosis L5-S1 up to L4-5--plan for surg w/Dr. Shon Baton 12/2020.   Chronic renal insufficiency, stage 3 (moderate) (HCC) 2022   GFR around 60   Colon cancer screening 10/27/2016   10/2016 and 04/2021 NEG   CORONARY ARTERY DISEASE 12/05/2006   DIABETES MELLITUS, TYPE II dx'd 2001   DVT (deep venous thrombosis) (HCC) 2016   BLE   GERD (gastroesophageal reflux disease)    History of balanitis    x 2. Holding off on circ as of 05/2020 urol f/u   History of blood transfusion 2001   "when I had my hand OR"   History of gout    History of kidney stones    HYPERCHOLESTEROLEMIA 07/17/2007   HYPERTENSION 12/05/2006   Learning disability    Migraine    "1-2/month" (06/26/2016)   Myocardial infarction (HCC) ~ 2003   Obesity, Class II, BMI 35-39.9    OSA on CPAP    Pt cannot recall setting clearly but thinks it is 5 cm h20.     PE (pulmonary embolism) 2016   Peripheral vascular disease (HCC)    Simple renal cyst    X 2 on R kidney (one 5 cm and one 2 cm)   Stroke (HCC) ~ 2006   denies residual on 06/26/2016   TIA (transient ischemic attack) ?06/25/2016   2018 and 01/2021 (MRI brain, MR angio head and neck neg 2022)   VISUAL  ACUITY, DECREASED, RIGHT EYE 02/19/2009   Past Surgical History:  Procedure Laterality Date   CARDIAC CATHETERIZATION  1990s; 2012; 06/2016   06/2016 distal LAD DES stent.  EF 55-65%   CARDIOVASCULAR STRESS TEST  06/2016   Echo stress test->"inconclusive"-->cath was done after this   CATARACT EXTRACTION W/ INTRAOCULAR LENS IMPLANT Right 2017   CORONARY PRESSURE/FFR STUDY N/A 06/27/2016   Procedure: Intravascular Pressure Wire/FFR Study;  Surgeon: Marykay Lex, MD;  Location[facility] INVASIVE CV LAB;  Service: Cardiovascular;  Laterality: N/A;   CORONARY STENT INTERVENTION N/A 06/27/2016   Procedure: Coronary Stent Intervention;  Surgeon: Marykay Lex, MD;  Location: [facility] INVASIVE CV LAB;  Service: Cardiovascular;  Laterality: N/A;   EYE SURGERY Right    Cataract   INGUINAL HERNIA REPAIR Bilateral 1990s   LEFT HEART CATH AND CORONARY ANGIOGRAPHY N/A 06/27/2016   Procedure: Left Heart Cath and Coronary Angiography;  Surgeon: Marykay Lex, MD;  Location: [facility] INVASIVE CV LAB;  Service: Cardiovascular;  Laterality: N/A;   LUMBAR LAMINECTOMY/DECOMPRESSION MICRODISCECTOMY Left 01/05/2021   Procedure: Left L4-5 Gill Decompression;  Surgeon: Venita Lick, MD;  Location: [facility] OR;  Service: Orthopedics;  Laterality: Left;  REATTACHMENT HAND Left ~ 2001   w/carpal tunnel release   UMBILICAL HERNIA REPAIR  1990s   w/IHR    Allergies  Allergies  Allergen Reactions   Actos [Pioglitazone] Other (See Comments)    Potential cause of DVT   Bee Venom Anaphylaxis   Klonopin [Clonazepam] Anaphylaxis   Invokana [Canagliflozin] Nausea Only    Nausea/dizzy/bad taste in mouth   Niacin Other (See Comments)    "makes his crazy"    History of Present Illness    Xavier Mcnell. is a 58 y.o. 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     He was very active and doing fishing and [place] shooting.  He had bilateral lower  extremity pain and subsequent ABIs without evidence of PAD.   He had lumbar decompression surgery by Dr. Shon Baton 01/05/2021. Admitted 01/25/21-01/27/21 with TIA with expressive aphasia and one episode of lockjaw. TIA vs side effect of narcotic after recent spine surgery. Echo stable. Crestor doubled as LDL above goal.   A month or so ago had some SSCP radiating to back and bad headache Has pain in left shoulder seeing Ortho No longer working at [employer]. Discussed need to update stress testing with PET/CT given new symptoms and known CAD Pain in chest seems muscular though    EKGs/Labs/Other Studies Reviewed:   The following studies were reviewed today:  Echo 01/26/21 1. Left ventricular ejection fraction, by estimation, is 60 to 65%. The  left ventricle has normal function. The left ventricle has no regional  wall motion abnormalities. There is moderate left ventricular hypertrophy.  Left ventricular diastolic  parameters are consistent with Grade I diastolic dysfunction (impaired  relaxation).   2. Right ventricular systolic function is normal. The right ventricular  size is normal. There is normal pulmonary artery systolic pressure.   3. The mitral valve is normal in structure. No evidence of mitral valve  regurgitation. No evidence of mitral stenosis.   4. The aortic valve is tricuspid. Aortic valve regurgitation is not  visualized. No aortic stenosis is present.   5. The inferior vena cava is normal in size with greater than 50%  respiratory variability, suggesting right atrial pressure of 3 mmHg.   LHC 06/2017  The left ventricular systolic function is normal. The left ventricular ejection fraction is 55-65% by visual estimate. LV end diastolic pressure is normal. ________Pertinent Coronary Anatomy Findings__________ Dist LAD-1 lesion, 70 %stenosed. FFR 0.77 (Physiologically Significant) A STENT SYNERGY DES 2.25X12 drug eluting stent was successfully placed  (post-dilated to 2.5 mm) Post intervention, there is a 0% residual stenosis.     Successful FFR guided PCI of distal LAD focal 70-75% lesion with DES stent.   Otherwise no sniffing CAD noted.   Plan: Return to nursing unit for ongoing care and TR band removal. Would continue aspirin plus Brilinta for minimum of 3 months. After that would be okay to stop aspirin if necessary. Continue risk factor modification by Cardiology Consultation Team He Would Be Okay Discharge from a Cardiac Standpoint/Post PCI on February 14.  EKG:  SR anterolateral ST changes   Recent Labs: 02/07/2023: ALT 20 05/30/2023: BUN 22; Creatinine, Ser 1.17; Potassium 4.5; Sodium 136  Recent Lipid Panel    Component Value Date/Time   CHOL 258 (H) 05/30/2023 0943   TRIG 214.0 (H) 05/30/2023 0943   HDL 75.20 05/30/2023 0943   CHOLHDL 3 05/30/2023 0943   VLDL  42.8 (H) 05/30/2023 0943   LDLCALC 140 (H) 05/30/2023 0943   LDLDIRECT 58.0 04/29/2021 1028    Home Medications   Current Meds  Medication Sig   Accu-Chek Softclix Lancets lancets Contact office to schedule with new provider   bromocriptine (PARLODEL) 2.5 MG tablet Take 1.25 mg by mouth daily.   clopidogrel (PLAVIX) 75 MG tablet TAKE 1 TABLET(75 MG) BY MOUTH DAILY   cyanocobalamin (VITAMIN B12) 1000 MCG tablet Take 1 tablet (1,000 mcg total) by mouth daily.   diazepam (VALIUM) 10 MG tablet Take 1 tablet (10 mg total) by mouth in the morning and at bedtime.   Dulaglutide (TRULICITY) 4.5 MG/0.5ML SOPN Inject 4.5 mg as directed once a week.   DULoxetine (CYMBALTA) 30 MG capsule Take 1 capsule (30 mg total) by mouth daily.   empagliflozin (JARDIANCE) 25 MG TABS tablet Take 1 tablet (25 mg total) by mouth daily.   EPINEPHrine 0.3 mg/0.3 mL IJ SOAJ injection Inject 0.3 mg into the muscle as needed for anaphylaxis.   fenofibrate (TRICOR) 145 MG tablet Take 1 tablet (145 mg total) by mouth daily.   fluconazole (DIFLUCAN) 150 MG tablet TAKE 1 TABLET BY MOUTH EVERY  7 DAYS   FLUoxetine (PROZAC) 40 MG capsule Take 1 capsule (40 mg total) by mouth daily.   fluticasone (FLONASE) 50 MCG/ACT nasal spray USE ONE SPRAY IN EACH NOSTRIL TWICE A DAY   gabapentin (NEURONTIN) 300 MG capsule Take 1 capsule (300 mg total) by mouth 2 (two) times daily.   glucose blood (ACCU-CHEK GUIDE) test strip Use to check glucose 2 times a day.   hydrochlorothiazide (HYDRODIURIL) 12.5 MG tablet TAKE 1 TABLET BY MOUTH EVERY DAY WITH THE LOSARTAN   insulin glargine, 1 Unit Dial, (TOUJEO SOLOSTAR) 300 UNIT/ML Solostar Pen 10 units subcu nightly   Insulin Pen Needle 31G X 8 MM MISC Use to administer Toujeo   losartan (COZAAR) 100 MG tablet Take 0.5 tablets (50 mg total) by mouth daily.   metFORMIN (GLUCOPHAGE-XR) 500 MG 24 hr tablet Take 2 tablets (1,000 mg total) by mouth 2 (two) times daily.   methocarbamol (ROBAXIN) 500 MG tablet Take 1 tablet (500 mg total) by mouth 2 (two) times daily.   nitroGLYCERIN (NITROSTAT) 0.4 MG SL tablet Place 1 tablet (0.4 mg total) under the tongue every 5 (five) minutes as needed for chest pain.   nystatin cream (MYCOSTATIN) Apply 1 application topically 2 (two) times daily.   ondansetron (ZOFRAN) 4 MG tablet Take 1 tablet (4 mg total) by mouth every 8 (eight) hours as needed for nausea or vomiting.   rivaroxaban (XARELTO) 20 MG TABS tablet TAKE 1 TABLET BY MOUTH EVERY DAY BEFORE SUPPER   rosuvastatin (CRESTOR) 20 MG tablet Take 1 tablet (20 mg total) by mouth daily. Take 1 tablet (20 mg total) by mouth daily. Please call [phone] to schedule an appointment for future refills. Final attempt. Thank you    Review of Systems      All other systems reviewed and are otherwise negative except as noted above.  Physical Exam    VS:  BP 134/80   Pulse 78   Ht 5\' 11"  (1.803 m)   Wt 243 lb 12.8 oz (110.6 kg)   SpO2 98%   BMI 34.00 kg/m  , BMI Body mass index is 34 kg/m.  Wt Readings from Last 3 Encounters:  06/01/23 243 lb 12.8 oz (110.6 kg)   05/30/23 246 lb 9.6 oz (111.9 kg)  02/21/23 241 lb 6.4 oz (109.5  kg)     Affect appropriate Healthy:  appears stated age HEENT: normal Neck supple with no adenopathy JVP normal no bruits no thyromegaly Lungs clear with no wheezing and good diaphragmatic motion Heart:  S1/S2 no murmur, no rub, gallop or click PMI normal Abdomen: benighn, BS positve, no tenderness, no AAA no bruit.  No HSM or HJR Distal pulses intact with no bruits Prior lumbar back surgery    Assessment & Plan    CAD - New chest/back pains Likely muscular.  GDMT includes clopidogrel, rosuvastatin. Heart healthy diet and regular cardiovascular exercise encouraged.  Stent to mid LAD 06/27/16 no significant dx in RCA/LCX        Update PET CT scan to r/o ischemia   HLD, LDL goal <70 -on crestor 20 mg LDL 51 10/19/22   History DVT/ PE -continue Xarelto 20 mg daily.  Dose appropriate.  Denies bleeding complications.  DM2 - Follows with Dr. Everardo All of endocrinology.  Appreciate inclusion of Jardiance for cardioprotective benefit.  Pernicious anemia with vitamin B12 deficiency -follows with primary care.  Obesity - Weight loss via diet and exercise encouraged. Discussed the impact being overweight would have on cardiovascular risk.   OSA - CPAP compliance encouraged.   TIA -01/27/21  admission with possible TIA versus side effects from narcotics after back surgery.  Follows with neurology.  Continue Plavix, statin.  Continue optimal BP, diabetes, cholesterol control. MRI/MRA negative 01/26/21   PET/CT   Disposition: Follow up in a year if low risk   Signed, [name], MD 06/01/2023, 8:39 AM [facility]

## 2023-05-27 ENCOUNTER — Other Ambulatory Visit: Payer: Self-pay | Admitting: Family Medicine

## 2023-05-30 ENCOUNTER — Encounter: Payer: Self-pay | Admitting: Family Medicine

## 2023-05-30 ENCOUNTER — Ambulatory Visit: Payer: Medicare Other | Admitting: Family Medicine

## 2023-05-30 VITALS — BP 129/81 | HR 78 | Wt 246.6 lb

## 2023-05-30 DIAGNOSIS — M25512 Pain in left shoulder: Secondary | ICD-10-CM

## 2023-05-30 DIAGNOSIS — E782 Mixed hyperlipidemia: Secondary | ICD-10-CM | POA: Diagnosis not present

## 2023-05-30 DIAGNOSIS — E119 Type 2 diabetes mellitus without complications: Secondary | ICD-10-CM

## 2023-05-30 DIAGNOSIS — Z7984 Long term (current) use of oral hypoglycemic drugs: Secondary | ICD-10-CM | POA: Diagnosis not present

## 2023-05-30 DIAGNOSIS — N1831 Chronic kidney disease, stage 3a: Secondary | ICD-10-CM

## 2023-05-30 DIAGNOSIS — E1121 Type 2 diabetes mellitus with diabetic nephropathy: Secondary | ICD-10-CM | POA: Diagnosis not present

## 2023-05-30 DIAGNOSIS — I10 Essential (primary) hypertension: Secondary | ICD-10-CM | POA: Diagnosis not present

## 2023-05-30 DIAGNOSIS — G8929 Other chronic pain: Secondary | ICD-10-CM

## 2023-05-30 DIAGNOSIS — Z7985 Long-term (current) use of injectable non-insulin antidiabetic drugs: Secondary | ICD-10-CM

## 2023-05-30 LAB — LIPID PANEL
Cholesterol: 258 mg/dL — ABNORMAL HIGH (ref 0–200)
HDL: 75.2 mg/dL (ref >39.00)
LDL Cholesterol: 140 mg/dL — ABNORMAL HIGH (ref 0–99)
NonHDL: 183.16
Total CHOL/HDL Ratio: 3
Triglycerides: 214 mg/dL — ABNORMAL HIGH (ref 0.0–149.0)
VLDL: 42.8 mg/dL — ABNORMAL HIGH (ref 0.0–40.0)

## 2023-05-30 LAB — BASIC METABOLIC PANEL
BUN: 22 mg/dL (ref 6–23)
CO2: 28 meq/L (ref 19–32)
Calcium: 10.6 mg/dL — ABNORMAL HIGH (ref 8.4–10.5)
Chloride: 97 meq/L (ref 96–112)
Creatinine, Ser: 1.17 mg/dL (ref 0.40–1.50)
GFR: 69.48 mL/min (ref >60.00)
Glucose, Bld: 184 mg/dL — ABNORMAL HIGH (ref 70–99)
Potassium: 4.5 meq/L (ref 3.5–5.1)
Sodium: 136 meq/L (ref 135–145)

## 2023-05-30 LAB — POCT GLYCOSYLATED HEMOGLOBIN (HGB A1C)
HbA1c POC (<> result, manual entry): 7.5 % (ref 4.0–5.6)
HbA1c, POC (controlled diabetic range): 7.5 % — AB (ref 0.0–7.0)
HbA1c, POC (prediabetic range): 7.5 % — AB (ref 5.7–6.4)
Hemoglobin A1C: 7.5 % — AB (ref 4.0–5.6)

## 2023-05-30 MED ORDER — METFORMIN HCL ER 500 MG PO TB24: 1000.0000 mg | ORAL_TABLET | Freq: Two times a day (BID) | ORAL | 3 refills | Status: DC

## 2023-05-30 MED ORDER — ROSUVASTATIN CALCIUM 20 MG PO TABS: 20.0000 mg | ORAL_TABLET | Freq: Every day | ORAL | 3 refills | Status: DC

## 2023-05-30 MED ORDER — RIVAROXABAN 20 MG PO TABS: ORAL_TABLET | ORAL | 3 refills | Status: DC

## 2023-05-30 MED ORDER — TOUJEO SOLOSTAR 300 UNIT/ML ~~LOC~~ SOPN: PEN_INJECTOR | SUBCUTANEOUS | 6 refills | Status: DC

## 2023-05-30 NOTE — Patient Instructions (Addendum)
 [facility]:   [address], [place], [place] [address]

## 2023-05-30 NOTE — Progress Notes (Signed)
OFFICE VISIT  05/30/2023  CC:  Chief Complaint  Patient presents with   Diabetes    Pt is fasting.     Patient is a 58 y.o. male who presents for 73-month follow-up diabetes, hypertension, hypercholesterolemia, and chronic renal insufficiency stage III. A/P as of last visit: "#1 diabetes with diabetic nephropathy, control is improving since starting Toujeo. Increase to 8 units daily. Continue Trulicity 4.5 mg q. 7 days, Jardiance 25 mg a day, and metformin XR 1000 mg twice daily. Next A1c around 05/09/2023.  2. hypertension, well-controlled on losartan one half of 100 mg tab daily, 12.5 mg HCTZ daily. Electrolytes and creatinine today.   3.  Chronic renal insufficiency stage III. Avoid NSAIDs. Electrolytes and creatinine today.  INTERIM HX: All lab work excellent last visit.  Reports approximately 2 to 38-month history of left shoulder pain.  Reaching out and up really bothers a lot.  He does have problems laying on that side when he sleeps.  No preceding overuse/strain/trauma. Denies pain in the neck or down the arm.  No arm paresthesias.    PMP AWARE reviewed today: most recent rx for diazepam was filled 05/08/2023, # 60, rx by me. No red flags.  Past Medical History:  Diagnosis Date   ANEMIA, PERNICIOUS 04/03/2007   Anxiety and depression    Arthritis    "left hand" (06/26/2016)   Chronic lower back pain    grd I degen spondylolisthesis at L4-5 w/associated synovial cyst at facet at this level +underlying severe epidural lipomatosis L5-S1 up to L4-5--plan for surg w/Dr. Shon Baton 12/2020.   Chronic renal insufficiency, stage 3 (moderate) (HCC) 2022   GFR around 60   Colon cancer screening 10/27/2016   Cologuard NEG-->rpt 3 yrs   CORONARY ARTERY DISEASE 12/05/2006   DIABETES MELLITUS, TYPE II dx'd 2001   DVT (deep venous thrombosis) (HCC) 2016   BLE   GERD (gastroesophageal reflux disease)    History of balanitis    x 2. Holding off on circ as of 05/2020 urol f/u    History of blood transfusion 2001   "when I had my hand OR"   History of gout    History of kidney stones    HYPERCHOLESTEROLEMIA 07/17/2007   HYPERTENSION 12/05/2006   Learning disability    Migraine    "1-2/month" (06/26/2016)   Myocardial infarction (HCC) ~ 2003   Obesity, Class II, BMI 35-39.9    OSA on CPAP    Pt cannot recall setting clearly but thinks it is 5 cm h20.     PE (pulmonary embolism) 2016   Peripheral vascular disease (HCC)    Simple renal cyst    X 2 on R kidney (one 5 cm and one 2 cm)   Stroke (HCC) ~ 2006   denies residual on 06/26/2016   TIA (transient ischemic attack) ?06/25/2016   2018 and 01/2021 (MRI brain, MR angio head and neck neg 2022)   VISUAL ACUITY, DECREASED, RIGHT EYE 02/19/2009    Past Surgical History:  Procedure Laterality Date   CARDIAC CATHETERIZATION  1990s; 2012; 06/2016   06/2016 distal LAD DES stent.  EF 55-65%   CARDIOVASCULAR STRESS TEST  06/2016   Echo stress test->"inconclusive"-->cath was done after this   CATARACT EXTRACTION W/ INTRAOCULAR LENS IMPLANT Right 2017   CORONARY PRESSURE/FFR STUDY N/A 06/27/2016   Procedure: Intravascular Pressure Wire/FFR Study;  Surgeon: Marykay Lex, MD;  Location: [facility] INVASIVE CV LAB;  Service: Cardiovascular;  Laterality: N/A;   CORONARY STENT INTERVENTION  N/A 06/27/2016   Procedure: Coronary Stent Intervention;  Surgeon: Marykay Lex, MD;  Location: [facility] INVASIVE CV LAB;  Service: Cardiovascular;  Laterality: N/A;   EYE SURGERY Right    Cataract   INGUINAL HERNIA REPAIR Bilateral 1990s   LEFT HEART CATH AND CORONARY ANGIOGRAPHY N/A 06/27/2016   Procedure: Left Heart Cath and Coronary Angiography;  Surgeon: Marykay Lex, MD;  Location: [facility] INVASIVE CV LAB;  Service: Cardiovascular;  Laterality: N/A;   LUMBAR LAMINECTOMY/DECOMPRESSION MICRODISCECTOMY Left 01/05/2021   Procedure: Left L4-5 Gill Decompression;  Surgeon: Venita Lick, MD;  Location: [facility] OR;  Service: Orthopedics;  Laterality: Left;    REATTACHMENT HAND Left ~ 2001   w/carpal tunnel release   UMBILICAL HERNIA REPAIR  1990s   w/IHR    Outpatient Medications Prior to Visit  Medication Sig Dispense Refill   Accu-Chek Softclix Lancets lancets Contact office to schedule with new provider 100 each 2   bromocriptine (PARLODEL) 2.5 MG tablet Take 1.25 mg by mouth daily.     clopidogrel (PLAVIX) 75 MG tablet TAKE 1 TABLET(75 MG) BY MOUTH DAILY 90 tablet 1   cyanocobalamin (VITAMIN B12) 1000 MCG tablet Take 1 tablet (1,000 mcg total) by mouth daily. 90 tablet 1   diazepam (VALIUM) 10 MG tablet Take 1 tablet (10 mg total) by mouth in the morning and at bedtime. 180 tablet 1   Dulaglutide (TRULICITY) 4.5 MG/0.5ML SOPN Inject 4.5 mg as directed once a week. 6 mL 3   DULoxetine (CYMBALTA) 30 MG capsule Take 1 capsule (30 mg total) by mouth daily. 90 capsule 1   empagliflozin (JARDIANCE) 25 MG TABS tablet Take 1 tablet (25 mg total) by mouth daily. 90 tablet 1   fenofibrate (TRICOR) 145 MG tablet Take 1 tablet (145 mg total) by mouth daily. 90 tablet 1   fluconazole (DIFLUCAN) 150 MG tablet TAKE 1 TABLET BY MOUTH EVERY 7 DAYS 4 tablet 1   FLUoxetine (PROZAC) 40 MG capsule Take 1 capsule (40 mg total) by mouth daily. 60 capsule 0   fluticasone (FLONASE) 50 MCG/ACT nasal spray USE ONE SPRAY IN EACH NOSTRIL TWICE A DAY 48 g 3   gabapentin (NEURONTIN) 300 MG capsule Take 1 capsule (300 mg total) by mouth 2 (two) times daily. 180 capsule 1   glucose blood (ACCU-CHEK GUIDE) test strip Use to check glucose 2 times a day. 100 each 5   hydrochlorothiazide (HYDRODIURIL) 12.5 MG tablet TAKE 1 TABLET BY MOUTH EVERY DAY WITH THE LOSARTAN 90 tablet 1   insulin glargine, 1 Unit Dial, (TOUJEO SOLOSTAR) 300 UNIT/ML Solostar Pen 8 units subcu nightly     Insulin Pen Needle 31G X 8 MM MISC Use to administer Toujeo 100 each 5   losartan (COZAAR) 100 MG tablet Take 0.5 tablets (50 mg total) by mouth daily. 45 tablet 1   metFORMIN (GLUCOPHAGE-XR) 500 MG 24  hr tablet Take 2 tablets (1,000 mg total) by mouth 2 (two) times daily. 360 tablet 1   methocarbamol (ROBAXIN) 500 MG tablet Take 1 tablet (500 mg total) by mouth 2 (two) times daily. 90 tablet 1   nystatin cream (MYCOSTATIN) Apply 1 application topically 2 (two) times daily. 30 g 1   ondansetron (ZOFRAN) 4 MG tablet Take 1 tablet (4 mg total) by mouth every 8 (eight) hours as needed for nausea or vomiting. 20 tablet 0   rivaroxaban (XARELTO) 20 MG TABS tablet TAKE 1 TABLET BY MOUTH EVERY DAY BEFORE SUPPER 90 tablet 3   rosuvastatin (CRESTOR)  20 MG tablet Take 1 tablet (20 mg total) by mouth daily. Take 1 tablet (20 mg total) by mouth daily. Please call [phone] to schedule an appointment for future refills. Final attempt. Thank you 90 tablet 3   EPINEPHrine 0.3 mg/0.3 mL IJ SOAJ injection Inject 0.3 mg into the muscle as needed for anaphylaxis. (Patient not taking: Reported on 05/30/2023) 2 each 1   nitroGLYCERIN (NITROSTAT) 0.4 MG SL tablet Place 1 tablet (0.4 mg total) under the tongue every 5 (five) minutes as needed for chest pain. (Patient not taking: Reported on 05/30/2023) 25 tablet 3   No facility-administered medications prior to visit.    Allergies  Allergen Reactions   Actos [Pioglitazone] Other (See Comments)    Potential cause of DVT   Bee Venom Anaphylaxis   Klonopin [Clonazepam] Anaphylaxis   Invokana [Canagliflozin] Nausea Only    Nausea/dizzy/bad taste in mouth   Niacin Other (See Comments)    "makes his crazy"    Review of Systems As per HPI  PE:    05/30/2023    8:51 AM 02/21/2023    8:50 AM 02/07/2023    8:34 AM  Vitals with BMI  Height  5\' 11"  5\' 11"   Weight 246 lbs 10 oz 241 lbs 6 oz 246 lbs 10 oz  BMI  33.68 34.41  Systolic 129 135 102  Diastolic 81 86 84  Pulse 78 69 66     Physical Exam  General: Alert and well-appearing. Left shoulder with no deformity or laxity. He has some mild tenderness to palpation around the acromion and over [facility]  joint. Range of motion: Fully intact but painful in abduction and external rotation.  Positive scare croak.  Negative stop sign.  Hawkins positive.  O'Brien's negative.  Drop sign negative. CV: RRR, no m/r/g.   LUNGS: CTA bilat, nonlabored resps, good aeration in all lung fields. EXT: no clubbing or cyanosis.  no edema.   LABS:  Last CBC Lab Results  Component Value Date   WBC 5.8 04/14/2022   HGB 14.6 04/14/2022   HCT 45.3 04/14/2022   MCV 93.9 04/14/2022   MCH 31.1 01/26/2021   RDW 13.7 04/14/2022   PLT 387.0 04/14/2022   Last metabolic panel Lab Results  Component Value Date   GLUCOSE 133 (H) 02/07/2023   NA 138 02/07/2023   K 3.9 02/07/2023   CL 102 02/07/2023   CO2 27 02/07/2023   BUN 12 02/07/2023   CREATININE 1.16 02/07/2023   GFR 70.36 02/07/2023   CALCIUM 9.7 02/07/2023   PHOS 4.0 06/23/2015   PROT 6.7 02/07/2023   ALBUMIN 4.2 02/07/2023   BILITOT 0.5 02/07/2023   ALKPHOS 37 (L) 02/07/2023   AST 15 02/07/2023   ALT 20 02/07/2023   ANIONGAP 9 01/26/2021   Last lipids Lab Results  Component Value Date   CHOL 147 10/19/2022   HDL 58.80 10/19/2022   LDLCALC 51 10/19/2022   LDLDIRECT 58.0 04/29/2021   TRIG 185.0 (H) 10/19/2022   CHOLHDL 3 10/19/2022   Last hemoglobin A1c Lab Results  Component Value Date   HGBA1C 7.8 (A) 02/07/2023   HGBA1C 7.8 02/07/2023   HGBA1C 7.8 (A) 02/07/2023   HGBA1C 7.8 (A) 02/07/2023   Last thyroid functions Lab Results  Component Value Date   TSH 1.51 03/15/2021   IMPRESSION AND PLAN:  #1 diabetes with diabetic nephropathy Increase Toujeo to 10 units daily. Continue Trulicity 4.5 mg q. 7 days, Jardiance 25 mg a day, and metformin XR 1000 mg  twice daily.  2. hypertension, well-controlled on losartan one half of 100 mg tab daily, 12.5 mg HCTZ daily. Electrolytes and creatinine today.   3.  Chronic renal insufficiency stage III. Avoid NSAIDs. Electrolytes and creatinine today.  #4  mixed hyperlipidemia, doing well  on the fenofibrate 145 mg a day and rosuvastatin 20 mg a day. Lipid panel today.  #5 chronic left shoulder pain. He does have impingement syndrome.  He may have a component of osteoarthritis. Left shoulder radiograph ordered today. He will return for shoulder injection.  An After Visit Summary was printed and given to the patient.  FOLLOW UP: 3 mo RCI  Signed:  Santiago Bumpers, MD           05/30/2023

## 2023-05-30 NOTE — Patient Instructions (Addendum)
 Increase Toujeo  to 10 Units daily    [facility]--  [address] [place], [place] [address]

## 2023-06-01 ENCOUNTER — Ambulatory Visit (HOSPITAL_BASED_OUTPATIENT_CLINIC_OR_DEPARTMENT_OTHER)
Admission: RE | Admit: 2023-06-01 | Discharge: 2023-06-01 | Disposition: A | Discharge status: Home / Self Care | Payer: Medicare Other | Source: Ambulatory Visit | Attending: Family Medicine | Admitting: Family Medicine

## 2023-06-01 ENCOUNTER — Ambulatory Visit (INDEPENDENT_AMBULATORY_CARE_PROVIDER_SITE_OTHER): Payer: Medicare Other | Admitting: Cardiovascular Disease

## 2023-06-01 VITALS — BP 134/80 | HR 78 | Ht 71.0 in | Wt 243.8 lb

## 2023-06-01 DIAGNOSIS — I25110 Atherosclerotic heart disease of native coronary artery with unstable angina pectoris: Secondary | ICD-10-CM

## 2023-06-01 DIAGNOSIS — R072 Precordial pain: Secondary | ICD-10-CM | POA: Diagnosis not present

## 2023-06-01 DIAGNOSIS — M25512 Pain in left shoulder: Secondary | ICD-10-CM | POA: Insufficient documentation

## 2023-06-01 DIAGNOSIS — M19012 Primary osteoarthritis, left shoulder: Secondary | ICD-10-CM | POA: Diagnosis not present

## 2023-06-01 DIAGNOSIS — G8929 Other chronic pain: Secondary | ICD-10-CM | POA: Diagnosis not present

## 2023-06-01 MED ORDER — RIVAROXABAN 20 MG PO TABS: ORAL_TABLET | ORAL | 3 refills | Status: AC

## 2023-06-01 NOTE — Patient Instructions (Addendum)
Medication Instructions:  Your physician recommends that you continue on your current medications as directed. Please refer to the Current Medication list given to you today.  *If you need a refill on your cardiac medications before your next appointment, please call your pharmacy*  Lab Work: If you have labs (blood work) drawn today and your tests are completely normal, you will receive your results only by: MyChart Message (if you have MyChart) OR A paper copy in the mail If you have any lab test that is abnormal or we need to change your treatment, we will call you to review the results.  Testing/Procedures: Your physician has requested that you have cardiac PET CT.   Follow-Up: At [facility], you and your health needs are our priority.  As part of our continuing mission to provide you with exceptional heart care, we have created designated Provider Care Teams.  These Care Teams include your primary Cardiologist (physician) and Advanced Practice Providers (APPs -  Physician Assistants and Nurse Practitioners) who all work together to provide you with the care you need, when you need it.  We recommend signing up for the patient portal called "MyChart".  Sign up information is provided on this After Visit Summary.  MyChart is used to connect with patients for Virtual Visits (Telemedicine).  Patients are able to view lab/test results, encounter notes, upcoming appointments, etc.  Non-urgent messages can be sent to your provider as well.   To learn more about what you can do with MyChart, go to [email or web address].    Your next appointment:   1 year(s)  Provider:   [name], MD     Other Instructions    Please report to Radiology at the [facility] Main Entrance 30 minutes early for your test.  [address] [place], [place] [address]   How to Prepare for Your Cardiac PET/CT Stress Test:  Nothing to eat or drink, except water, 3 hours prior to  arrival time.  NO caffeine/decaffeinated products, or chocolate 12 hours prior to arrival. (Please note decaffeinated beverages (teas/coffees) still contain caffeine).  If you have caffeine within 12 hours prior, the test will need to be rescheduled.  Medication instructions: Do not take erectile dysfunction medications for 72 hours prior to test (sildenafil, tadalafil) Do not take nitrates (isosorbide mononitrate, Ranexa) the day before or day of test Do not take tamsulosin the day before or morning of test Hold theophylline containing medications for 12 hours. Hold Dipyridamole 48 hours prior to the test.  Diabetic Preparation: If able to eat breakfast prior to 3 hour fasting, you may take all medications, including your insulin. Do not worry if you miss your breakfast dose of insulin - start at your next meal. If you do not eat prior to 3 hour fast-Hold all diabetes (oral and insulin) medications. Patients who wear a continuous glucose monitor MUST remove the device prior to scanning.  You may take your remaining medications with water.  NO perfume, cologne or lotion on chest or abdomen area.  Total time is 1 to 2 hours; you may want to bring reading material for the waiting time.   In preparation for your appointment, medication and supplies will be purchased.  Appointment availability is limited, so if you need to cancel or reschedule, please call the Radiology Department at [phone] [facility]) OR [phone] [facility]) 24 hours in advance to avoid a cancellation fee of $100.00  What to Expect When you Arrive:  Once you arrive  and check in for your appointment, you will be taken to a preparation room within the Radiology Department.  A technologist or Nurse will obtain your medical history, verify that you are correctly prepped for the exam, and explain the procedure.  Afterwards, an IV will be started in your arm and electrodes will be placed on your skin for EKG monitoring during  the stress portion of the exam. Then you will be escorted to the PET/CT scanner.  There, staff will get you positioned on the scanner and obtain a blood pressure and EKG.  During the exam, you will continue to be connected to the EKG and blood pressure machines.  A small, safe amount of a radioactive tracer will be injected in your IV to obtain a series of pictures of your heart along with an injection of a stress agent.    After your Exam:  It is recommended that you eat a meal and drink a caffeinated beverage to counter act any effects of the stress agent.  Drink plenty of fluids for the remainder of the day and urinate frequently for the first couple of hours after the exam.  Your doctor will inform you of your test results within 7-10 business days.  For more information and frequently asked questions, please visit our website: [email or web address]  For questions about your test or how to prepare for your test, please call: Cardiac Imaging Nurse Navigators Office: [phone]

## 2023-06-04 ENCOUNTER — Encounter (HOSPITAL_COMMUNITY): Payer: Self-pay

## 2023-06-05 ENCOUNTER — Ambulatory Visit (HOSPITAL_COMMUNITY)
Admission: RE | Admit: 2023-06-05 | Discharge: 2023-06-05 | Disposition: A | Discharge status: Home / Self Care | Payer: Medicare Other | Source: Ambulatory Visit | Attending: Cardiovascular Disease | Admitting: Cardiovascular Disease

## 2023-06-05 ENCOUNTER — Ambulatory Visit: Payer: Medicare Other | Admitting: Family Medicine

## 2023-06-05 VITALS — BP 138/87 | HR 80 | Wt 252.0 lb

## 2023-06-05 DIAGNOSIS — R072 Precordial pain: Secondary | ICD-10-CM

## 2023-06-05 DIAGNOSIS — M7541 Impingement syndrome of right shoulder: Secondary | ICD-10-CM | POA: Diagnosis not present

## 2023-06-05 DIAGNOSIS — M25511 Pain in right shoulder: Secondary | ICD-10-CM

## 2023-06-05 LAB — NM PET CT CARDIAC PERFUSION MULTI W/ABSOLUTE BLOODFLOW
LV dias vol: 76 mL (ref 62–150)
LV sys vol: 19 mL
MBFR: 2.89
Nuc Rest EF: 75 %
Nuc Stress EF: 75 %
Peak HR: 90 {beats}/min
Rest HR: 73 {beats}/min
Rest MBF: 0.9 ml/g/min
Rest Nuclear Isotope Dose: 28.8 mCi
ST Depression (mm): 0 mm
Stress MBF: 2.6 ml/g/min
Stress Nuclear Isotope Dose: 28.6 mCi

## 2023-06-05 MED ORDER — TRIAMCINOLONE ACETONIDE 40 MG/ML IJ SUSP
40.0000 mg | Freq: Once | INTRAMUSCULAR | Status: AC
  Administered 2023-06-05: 40 mg via INTRAMUSCULAR

## 2023-06-05 MED ORDER — REGADENOSON 0.4 MG/5ML IV SOLN
0.4000 mg | Freq: Once | INTRAVENOUS | Status: AC
  Administered 2023-06-05: 0.4 mg via INTRAVENOUS

## 2023-06-05 MED ORDER — RUBIDIUM RB82 GENERATOR (RUBYFILL)
25.0000 | PACK | Freq: Once | INTRAVENOUS | Status: AC
Start: 2023-06-05 — End: 2023-06-05
  Administered 2023-06-05: 28.56 via INTRAVENOUS

## 2023-06-05 MED ORDER — RUBIDIUM RB82 GENERATOR (RUBYFILL)
25.0000 | PACK | Freq: Once | INTRAVENOUS | Status: AC
  Administered 2023-06-05: 28.54 via INTRAVENOUS

## 2023-06-05 MED ORDER — REGADENOSON 0.4 MG/5ML IV SOLN
INTRAVENOUS | Status: AC
  Filled 2023-06-05: qty 5

## 2023-06-05 NOTE — Progress Notes (Signed)
OFFICE VISIT  06/05/2023  CC:  Chief Complaint  Patient presents with   Shoulder Pain    Pt here for steroid injection.     Patient is a 58 y.o. male who presents for follow-up left shoulder pain, here for steroid injection. I last saw him on 05/30/2023--->"chronic left shoulder pain. He does have impingement syndrome.  He may have a component of osteoarthritis. Left shoulder radiograph ordered today. He will return for shoulder injection."  INTERIM HX: Shoulder still hurting the same. Left shoulder radiograph 06/01/2023: Awaiting over read by radiologist.  My impression is small amount of glenohumeral arthritis, otherwise normal.  Past Medical History:  Diagnosis Date   ANEMIA, PERNICIOUS 04/03/2007   Anxiety and depression    Arthritis    "left hand" (06/26/2016)   Chronic lower back pain    grd I degen spondylolisthesis at L4-5 w/associated synovial cyst at facet at this level +underlying severe epidural lipomatosis L5-S1 up to L4-5--plan for surg w/Dr. Shon Baton 12/2020.   Chronic renal insufficiency, stage 3 (moderate) (HCC) 2022   GFR around 60   Colon cancer screening 10/27/2016   10/2016 and 04/2021 NEG   CORONARY ARTERY DISEASE 12/05/2006   DIABETES MELLITUS, TYPE II dx'd 2001   DVT (deep venous thrombosis) (HCC) 2016   BLE   GERD (gastroesophageal reflux disease)    History of balanitis    x 2. Holding off on circ as of 05/2020 urol f/u   History of blood transfusion 2001   "when I had my hand OR"   History of gout    History of kidney stones    HYPERCHOLESTEROLEMIA 07/17/2007   HYPERTENSION 12/05/2006   Learning disability    Migraine    "1-2/month" (06/26/2016)   Myocardial infarction (HCC) ~ 2003   Obesity, Class II, BMI 35-39.9    OSA on CPAP    Pt cannot recall setting clearly but thinks it is 5 cm h20.     PE (pulmonary embolism) 2016   Peripheral vascular disease (HCC)    Simple renal cyst    X 2 on R kidney (one 5 cm and one 2 cm)   Stroke (HCC) ~ 2006    denies residual on 06/26/2016   TIA (transient ischemic attack) ?06/25/2016   2018 and 01/2021 (MRI brain, MR angio head and neck neg 2022)   VISUAL ACUITY, DECREASED, RIGHT EYE 02/19/2009    Past Surgical History:  Procedure Laterality Date   CARDIAC CATHETERIZATION  1990s; 2012; 06/2016   06/2016 distal LAD DES stent.  EF 55-65%   CARDIOVASCULAR STRESS TEST  06/2016   Echo stress test->"inconclusive"-->cath was done after this   CATARACT EXTRACTION W/ INTRAOCULAR LENS IMPLANT Right 2017   CORONARY PRESSURE/FFR STUDY N/A 06/27/2016   Procedure: Intravascular Pressure Wire/FFR Study;  Surgeon: Marykay Lex, MD;  Location: [facility] INVASIVE CV LAB;  Service: Cardiovascular;  Laterality: N/A;   CORONARY STENT INTERVENTION N/A 06/27/2016   Procedure: Coronary Stent Intervention;  Surgeon: Marykay Lex, MD;  Location: [facility] INVASIVE CV LAB;  Service: Cardiovascular;  Laterality: N/A;   EYE SURGERY Right    Cataract   INGUINAL HERNIA REPAIR Bilateral 1990s   LEFT HEART CATH AND CORONARY ANGIOGRAPHY N/A 06/27/2016   Procedure: Left Heart Cath and Coronary Angiography;  Surgeon: Marykay Lex, MD;  Location: [facility] INVASIVE CV LAB;  Service: Cardiovascular;  Laterality: N/A;   LUMBAR LAMINECTOMY/DECOMPRESSION MICRODISCECTOMY Left 01/05/2021   Procedure: Left L4-5 Gill Decompression;  Surgeon: Venita Lick, MD;  Location:  [facility] OR;  Service: Orthopedics;  Laterality: Left;   REATTACHMENT HAND Left ~ 2001   w/carpal tunnel release   UMBILICAL HERNIA REPAIR  1990s   w/IHR    Outpatient Medications Prior to Visit  Medication Sig Dispense Refill   Accu-Chek Softclix Lancets lancets Contact office to schedule with new provider 100 each 2   bromocriptine (PARLODEL) 2.5 MG tablet Take 1.25 mg by mouth daily.     clopidogrel (PLAVIX) 75 MG tablet TAKE 1 TABLET(75 MG) BY MOUTH DAILY 90 tablet 1   cyanocobalamin (VITAMIN B12) 1000 MCG tablet Take 1 tablet (1,000 mcg total) by mouth daily. 90 tablet 1    diazepam (VALIUM) 10 MG tablet Take 1 tablet (10 mg total) by mouth in the morning and at bedtime. 180 tablet 1   Dulaglutide (TRULICITY) 4.5 MG/0.5ML SOPN Inject 4.5 mg as directed once a week. 6 mL 3   DULoxetine (CYMBALTA) 30 MG capsule Take 1 capsule (30 mg total) by mouth daily. 90 capsule 1   empagliflozin (JARDIANCE) 25 MG TABS tablet Take 1 tablet (25 mg total) by mouth daily. 90 tablet 1   EPINEPHrine 0.3 mg/0.3 mL IJ SOAJ injection Inject 0.3 mg into the muscle as needed for anaphylaxis. 2 each 1   fenofibrate (TRICOR) 145 MG tablet Take 1 tablet (145 mg total) by mouth daily. 90 tablet 1   fluconazole (DIFLUCAN) 150 MG tablet TAKE 1 TABLET BY MOUTH EVERY 7 DAYS 4 tablet 1   FLUoxetine (PROZAC) 40 MG capsule Take 1 capsule (40 mg total) by mouth daily. 60 capsule 0   fluticasone (FLONASE) 50 MCG/ACT nasal spray USE ONE SPRAY IN EACH NOSTRIL TWICE A DAY 48 g 3   gabapentin (NEURONTIN) 300 MG capsule Take 1 capsule (300 mg total) by mouth 2 (two) times daily. 180 capsule 1   glucose blood (ACCU-CHEK GUIDE) test strip Use to check glucose 2 times a day. 100 each 5   hydrochlorothiazide (HYDRODIURIL) 12.5 MG tablet TAKE 1 TABLET BY MOUTH EVERY DAY WITH THE LOSARTAN 90 tablet 1   insulin glargine, 1 Unit Dial, (TOUJEO SOLOSTAR) 300 UNIT/ML Solostar Pen 10 units subcu nightly 6 mL 6   Insulin Pen Needle 31G X 8 MM MISC Use to administer Toujeo 100 each 5   losartan (COZAAR) 100 MG tablet Take 0.5 tablets (50 mg total) by mouth daily. 45 tablet 1   metFORMIN (GLUCOPHAGE-XR) 500 MG 24 hr tablet Take 2 tablets (1,000 mg total) by mouth 2 (two) times daily. 180 tablet 3   methocarbamol (ROBAXIN) 500 MG tablet Take 1 tablet (500 mg total) by mouth 2 (two) times daily. 90 tablet 1   nitroGLYCERIN (NITROSTAT) 0.4 MG SL tablet Place 1 tablet (0.4 mg total) under the tongue every 5 (five) minutes as needed for chest pain. 25 tablet 3   nystatin cream (MYCOSTATIN) Apply 1 application topically 2 (two)  times daily. 30 g 1   ondansetron (ZOFRAN) 4 MG tablet Take 1 tablet (4 mg total) by mouth every 8 (eight) hours as needed for nausea or vomiting. 20 tablet 0   rivaroxaban (XARELTO) 20 MG TABS tablet TAKE 1 TABLET BY MOUTH EVERY DAY BEFORE SUPPER 90 tablet 3   rosuvastatin (CRESTOR) 20 MG tablet Take 1 tablet (20 mg total) by mouth daily. Take 1 tablet (20 mg total) by mouth daily. Please call [phone] to schedule an appointment for future refills. Final attempt. Thank you 90 tablet 3   No facility-administered medications prior to visit.  Allergies  Allergen Reactions   Actos [Pioglitazone] Other (See Comments)    Potential cause of DVT   Bee Venom Anaphylaxis   Klonopin [Clonazepam] Anaphylaxis   Invokana [Canagliflozin] Nausea Only    Nausea/dizzy/bad taste in mouth   Niacin Other (See Comments)    "makes his crazy"    Review of Systems As per HPI  PE:    06/05/2023   11:10 AM 06/05/2023    8:14 AM 06/05/2023    8:13 AM  Vitals with BMI  Weight 252 lbs    BMI 35.16    Systolic 138 118 638  Diastolic 87 72 71  Pulse 80 80 84     Physical Exam  Gen: Alert, well appearing.  Patient is oriented to person, place, time, and situation. AFFECT: pleasant, lucid thought and speech. He has general signs of rotator cuff impingement. See detailed exam from my note on 05/30/2023.  LABS:  Last metabolic panel Lab Results  Component Value Date   GLUCOSE 184 (H) 05/30/2023   NA 136 05/30/2023   K 4.5 05/30/2023   CL 97 05/30/2023   CO2 28 05/30/2023   BUN 22 05/30/2023   CREATININE 1.17 05/30/2023   GFR 69.48 05/30/2023   CALCIUM 10.6 (H) 05/30/2023   PHOS 4.0 06/23/2015   PROT 6.7 02/07/2023   ALBUMIN 4.2 02/07/2023   BILITOT 0.5 02/07/2023   ALKPHOS 37 (L) 02/07/2023   AST 15 02/07/2023   ALT 20 02/07/2023   ANIONGAP 9 01/26/2021    IMPRESSION AND PLAN:  Chronic left shoulder pain, impingement syndrome. Radiograph with small amount of glenohumeral  osteoarthritis changes per my reading.  Awaiting radiologist overread. Patient here for steroid injection today.  Ultrasound-guided injection is preferred based on studies that show increased duration, increased effect, greater accuracy, decreased procedural pain, increased response rate, and decreased cost with ultrasound-guided versus blind injection. Procedure: Real-time ultrasound guided injection of left shoulder subacromial/subdeltoid bursa. Device: GE [employer] informed consent obtained.  Timeout conducted.  No overlying erythema, induration, or other signs of local infection. After sterile prep with Betadine, injected mixture of 40 mg Kenalog and 3 mL of 1% plain lidocaine.  Injectate seen filling subacromial bursa. Patient tolerated the procedure well.  No immediate complications.  Post-injection care discussed. Advised to call if fever/chills, erythema, drainage, or persistent bleeding.  Impression: Technically successful ultrasound-guided injection.   An After Visit Summary was printed and given to the patient.  FOLLOW UP: No follow-ups on file.  Signed:  Santiago Bumpers, MD           06/05/2023

## 2023-06-08 ENCOUNTER — Encounter: Payer: Self-pay | Admitting: Cardiovascular Disease

## 2023-06-08 ENCOUNTER — Encounter: Payer: Self-pay | Admitting: Family Medicine

## 2023-06-27 ENCOUNTER — Telehealth: Payer: Self-pay

## 2023-06-27 NOTE — Telephone Encounter (Signed)
Please assist patient with scheduling, thanks.

## 2023-06-27 NOTE — Telephone Encounter (Signed)
Called patient to schedule AWVS, while on the phone patient requested I send a message to PCP. Patient has been sick for 1 week and prefers not to come into office for an appointment but is requesting something be sent into pharmacy. I advised patient that I would send request however he may need to do a virtual visit- patient will do virtual visit f s that is an option. **fever, congestion, cough, chills x 1 week- Robitussin, Mucinex and Theraflu are not helping symptoms resolve. Patients wife and son currently have pneumonia and have tested negative for flu. *Please call patient and advise

## 2023-06-28 NOTE — Telephone Encounter (Signed)
Virtual visit ok

## 2023-06-28 NOTE — Telephone Encounter (Signed)
Tried calling patient, unable to LVM.  See message below.

## 2023-06-29 NOTE — Telephone Encounter (Signed)
Sending as Lorain Childes

## 2023-07-18 ENCOUNTER — Ambulatory Visit: Payer: Medicare Other | Admitting: *Deleted

## 2023-07-18 DIAGNOSIS — Z0000 Encounter for general adult medical examination without abnormal findings: Secondary | ICD-10-CM

## 2023-07-18 NOTE — Patient Instructions (Signed)
 Xavier [name] , Thank you for taking time to come for your Medicare Wellness Visit. I appreciate your ongoing commitment to your health goals. Please review the following plan we discussed and let me know if I can assist you in the future.   Screening recommendations/referrals: Colonoscopy: up to date Recommended yearly ophthalmology/optometry visit for glaucoma screening and checkup Recommended yearly dental visit for hygiene and checkup  Vaccinations: Influenza vaccine: up to date Pneumococcal vaccine: up to date Tdap vaccine: up to date Shingles vaccine: up to date    Preventive Care 40-64 Years, Male Preventive care refers to lifestyle choices and visits with your health care provider that can promote health and wellness. What does preventive care include? A yearly physical exam. This is also called an annual well check. Dental exams once or twice a year. Routine eye exams. Ask your health care provider how often you should have your eyes checked. Personal lifestyle choices, including: Daily care of your teeth and gums. Regular physical activity. Eating a healthy diet. Avoiding tobacco and drug use. Limiting alcohol use. Practicing safe sex. Taking low-dose aspirin every day starting at age 57. What happens during an annual well check? The services and screenings done by your health care provider during your annual well check will depend on your age, overall health, lifestyle risk factors, and family history of disease. Counseling  Your health care provider may ask you questions about your: Alcohol use. Tobacco use. Drug use. Emotional well-being. Home and relationship well-being. Sexual activity. Eating habits. Work and work Astronomer. Screening  You may have the following tests or measurements: Height, weight, and BMI. Blood pressure. Lipid and cholesterol levels. These may be checked every 5 years, or more frequently if you are over 50 years old. Skin check. Lung  cancer screening. You may have this screening every year starting at age 57 if you have a 30-pack-year history of smoking and currently smoke or have quit within the past 15 years. Fecal occult blood test (FOBT) of the stool. You may have this test every year starting at age 57. Flexible sigmoidoscopy or colonoscopy. You may have a sigmoidoscopy every 5 years or a colonoscopy every 10 years starting at age 57. Prostate cancer screening. Recommendations will vary depending on your family history and other risks. Hepatitis C blood test. Hepatitis B blood test. Sexually transmitted disease (STD) testing. Diabetes screening. This is done by checking your blood sugar (glucose) after you have not eaten for a while (fasting). You may have this done every 1-3 years. Discuss your test results, treatment options, and if necessary, the need for more tests with your health care provider. Vaccines  Your health care provider may recommend certain vaccines, such as: Influenza vaccine. This is recommended every year. Tetanus, diphtheria, and acellular pertussis (Tdap, Td) vaccine. You may need a Td booster every 10 years. Zoster vaccine. You may need this after age 57. Pneumococcal 13-valent conjugate (PCV13) vaccine. You may need this if you have certain conditions and have not been vaccinated. Pneumococcal polysaccharide (PPSV23) vaccine. You may need one or two doses if you smoke cigarettes or if you have certain conditions. Talk to your health care provider about which screenings and vaccines you need and how often you need them. This information is not intended to replace advice given to you by your health care provider. Make sure you discuss any questions you have with your health care provider. Document Released: 05/28/2015 Document Revised: 01/19/2016 Document Reviewed: 03/02/2015 Elsevier Interactive Patient Education  2017  [employer].  Fall Prevention in the Home Falls can cause injuries. They can  happen to people of all ages. There are many things you can do to make your home safe and to help prevent falls. What can I do on the outside of my home? Regularly fix the edges of walkways and driveways and fix any cracks. Remove anything that might make you trip as you walk through a door, such as a raised step or threshold. Trim any bushes or trees on the path to your home. Use bright outdoor lighting. Clear any walking paths of anything that might make someone trip, such as rocks or tools. Regularly check to see if handrails are loose or broken. Make sure that both sides of any steps have handrails. Any raised decks and porches should have guardrails on the edges. Have any leaves, snow, or ice cleared regularly. Use sand or salt on walking paths during winter. Clean up any spills in your garage right away. This includes oil or grease spills. What can I do in the bathroom? Use night lights. Install grab bars by the toilet and in the tub and shower. Do not use towel bars as grab bars. Use non-skid mats or decals in the tub or shower. If you need to sit down in the shower, use a plastic, non-slip stool. Keep the floor dry. Clean up any water that spills on the floor as soon as it happens. Remove soap buildup in the tub or shower regularly. Attach bath mats securely with double-sided non-slip rug tape. Do not have throw rugs and other things on the floor that can make you trip. What can I do in the bedroom? Use night lights. Make sure that you have a light by your bed that is easy to reach. Do not use any sheets or blankets that are too big for your bed. They should not hang down onto the floor. Have a firm chair that has side arms. You can use this for support while you get dressed. Do not have throw rugs and other things on the floor that can make you trip. What can I do in the kitchen? Clean up any spills right away. Avoid walking on wet floors. Keep items that you use a lot in  easy-to-reach places. If you need to reach something above you, use a strong step stool that has a grab bar. Keep electrical cords out of the way. Do not use floor polish or wax that makes floors slippery. If you must use wax, use non-skid floor wax. Do not have throw rugs and other things on the floor that can make you trip. What can I do with my stairs? Do not leave any items on the stairs. Make sure that there are handrails on both sides of the stairs and use them. Fix handrails that are broken or loose. Make sure that handrails are as long as the stairways. Check any carpeting to make sure that it is firmly attached to the stairs. Fix any carpet that is loose or worn. Avoid having throw rugs at the top or bottom of the stairs. If you do have throw rugs, attach them to the floor with carpet tape. Make sure that you have a light switch at the top of the stairs and the bottom of the stairs. If you do not have them, ask someone to add them for you. What else can I do to help prevent falls? Wear shoes that: Do not have high heels. Have rubber bottoms. Are  comfortable and fit you well. Are closed at the toe. Do not wear sandals. If you use a stepladder: Make sure that it is fully opened. Do not climb a closed stepladder. Make sure that both sides of the stepladder are locked into place. Ask someone to hold it for you, if possible. Clearly mark and make sure that you can see: Any grab bars or handrails. First and last steps. Where the edge of each step is. Use tools that help you move around (mobility aids) if they are needed. These include: Canes. Walkers. Scooters. Crutches. Turn on the lights when you go into a dark area. Replace any light bulbs as soon as they burn out. Set up your furniture so you have a clear path. Avoid moving your furniture around. If any of your floors are uneven, fix them. If there are any pets around you, be aware of where they are. Review your medicines  with your doctor. Some medicines can make you feel dizzy. This can increase your chance of falling. Ask your doctor what other things that you can do to help prevent falls. This information is not intended to replace advice given to you by your health care provider. Make sure you discuss any questions you have with your health care provider. Document Released: 02/25/2009 Document Revised: 10/07/2015 Document Reviewed: 06/05/2014 Elsevier Interactive Patient Education  2017 [employer].

## 2023-07-18 NOTE — Progress Notes (Signed)
 Subjective:   Xavier [name]. is a 58 y.o. male who presents for Medicare Annual/Subsequent preventive examination.  Visit Complete: Virtual I connected with  Xavier [name]. on 07/18/23 by a audio enabled telemedicine application and verified that I am speaking with the correct person using two identifiers.  Patient Location: Home  Provider Location: Home Office  I discussed the limitations of evaluation and management by telemedicine. The patient expressed understanding and agreed to proceed.  Vital Signs: Because this visit was a virtual/telehealth visit, some criteria may be missing or patient reported. Any vitals not documented were not able to be obtained and vitals that have been documented are patient reported.  Cardiac Risk Factors include: advanced age (>65men, >66 women);diabetes mellitus;male gender;hypertension     Objective:    Today's Vitals   07/18/23 1214  PainSc: 1    There is no height or weight on file to calculate BMI.     07/18/2023   12:16 PM 05/10/2022    2:06 PM 07/14/2021    9:36 AM 05/05/2021    1:01 PM 01/26/2021   12:00 AM 01/25/2021    3:11 PM 01/24/2021   12:51 PM  Advanced Directives  Does Patient Have a Medical Advance Directive? No No No Yes No No No  Would patient like information on creating a medical advance directive? No - Patient declined No - Patient declined Yes (ED - Information included in AVS)  No - Patient declined      Current Medications (verified) Outpatient Encounter Medications as of 07/18/2023  Medication Sig   Accu-Chek Softclix Lancets lancets Contact office to schedule with new provider   bromocriptine (PARLODEL) 2.5 MG tablet Take 1.25 mg by mouth daily.   clopidogrel (PLAVIX) 75 MG tablet TAKE 1 TABLET(75 MG) BY MOUTH DAILY   cyanocobalamin (VITAMIN B12) 1000 MCG tablet Take 1 tablet (1,000 mcg total) by mouth daily.   diazepam (VALIUM) 10 MG tablet Take 1 tablet (10 mg total) by mouth in the morning and at bedtime.    Dulaglutide (TRULICITY) 4.5 MG/0.5ML SOPN Inject 4.5 mg as directed once a week.   DULoxetine (CYMBALTA) 30 MG capsule Take 1 capsule (30 mg total) by mouth daily.   empagliflozin (JARDIANCE) 25 MG TABS tablet Take 1 tablet (25 mg total) by mouth daily.   EPINEPHrine 0.3 mg/0.3 mL IJ SOAJ injection Inject 0.3 mg into the muscle as needed for anaphylaxis.   fenofibrate (TRICOR) 145 MG tablet Take 1 tablet (145 mg total) by mouth daily.   fluconazole (DIFLUCAN) 150 MG tablet TAKE 1 TABLET BY MOUTH EVERY 7 DAYS   FLUoxetine (PROZAC) 40 MG capsule Take 1 capsule (40 mg total) by mouth daily.   fluticasone (FLONASE) 50 MCG/ACT nasal spray USE ONE SPRAY IN EACH NOSTRIL TWICE A DAY   gabapentin (NEURONTIN) 300 MG capsule Take 1 capsule (300 mg total) by mouth 2 (two) times daily.   glucose blood (ACCU-CHEK GUIDE) test strip Use to check glucose 2 times a day.   hydrochlorothiazide (HYDRODIURIL) 12.5 MG tablet TAKE 1 TABLET BY MOUTH EVERY DAY WITH THE LOSARTAN   insulin glargine, 1 Unit Dial, (TOUJEO SOLOSTAR) 300 UNIT/ML Solostar Pen 10 units subcu nightly   Insulin Pen Needle 31G X 8 MM MISC Use to administer Toujeo   losartan (COZAAR) 100 MG tablet Take 0.5 tablets (50 mg total) by mouth daily.   metFORMIN (GLUCOPHAGE-XR) 500 MG 24 hr tablet Take 2 tablets (1,000 mg total) by mouth 2 (two) times daily.  methocarbamol (ROBAXIN) 500 MG tablet Take 1 tablet (500 mg total) by mouth 2 (two) times daily.   nitroGLYCERIN (NITROSTAT) 0.4 MG SL tablet Place 1 tablet (0.4 mg total) under the tongue every 5 (five) minutes as needed for chest pain.   nystatin cream (MYCOSTATIN) Apply 1 application topically 2 (two) times daily.   ondansetron (ZOFRAN) 4 MG tablet Take 1 tablet (4 mg total) by mouth every 8 (eight) hours as needed for nausea or vomiting.   rosuvastatin (CRESTOR) 20 MG tablet Take 1 tablet (20 mg total) by mouth daily. Take 1 tablet (20 mg total) by mouth daily. Please call [phone] to  schedule an appointment for future refills. Final attempt. Thank you   rivaroxaban (XARELTO) 20 MG TABS tablet TAKE 1 TABLET BY MOUTH EVERY DAY BEFORE SUPPER (Patient not taking: Reported on 07/18/2023)   No facility-administered encounter medications on file as of 07/18/2023.    Allergies (verified) Actos [pioglitazone], Bee venom, Klonopin [clonazepam], Invokana [canagliflozin], and Niacin   History: Past Medical History:  Diagnosis Date   ANEMIA, PERNICIOUS 04/03/2007   Anxiety and depression    Arthritis    "left hand" (06/26/2016)   Chronic lower back pain    grd I degen spondylolisthesis at L4-5 w/associated synovial cyst at facet at this level +underlying severe epidural lipomatosis L5-S1 up to L4-5--plan for surg w/Dr. Shon [name] 12/2020.   Chronic renal insufficiency, stage 3 (moderate) (HCC) 2022   GFR around 60   Colon cancer screening 10/27/2016   10/2016 and 04/2021 NEG   CORONARY ARTERY DISEASE 12/05/2006   DIABETES MELLITUS, TYPE II dx'd 2001   DVT (deep venous thrombosis) (HCC) 2016   BLE   GERD (gastroesophageal reflux disease)    History of balanitis    x 2. Holding off on circ as of 05/2020 urol f/u   History of blood transfusion 2001   "when I had my hand OR"   History of gout    History of kidney stones    HYPERCHOLESTEROLEMIA 07/17/2007   HYPERTENSION 12/05/2006   Learning disability    Migraine    "1-2/month" (06/26/2016)   Myocardial infarction (HCC) ~ 2003   Obesity, Class II, BMI 35-39.9    OSA on CPAP    Pt cannot recall setting clearly but thinks it is 5 cm h20.     PE (pulmonary embolism) 2016   Peripheral vascular disease (HCC)    Simple renal cyst    X 2 on R kidney (one 5 cm and one 2 cm)   Stroke (HCC) ~ 2006   denies residual on 06/26/2016   TIA (transient ischemic attack) ?06/25/2016   2018 and 01/2021 (MRI brain, MR angio head and neck neg 2022)   VISUAL ACUITY, DECREASED, RIGHT EYE 02/19/2009   Past Surgical History:  Procedure Laterality Date    CARDIAC CATHETERIZATION  1990s; 2012; 06/2016   06/2016 distal LAD DES stent.  EF 55-65%   CARDIOVASCULAR STRESS TEST  06/2016   Echo stress test->"inconclusive"-->cath was done after this   CATARACT EXTRACTION W/ INTRAOCULAR LENS IMPLANT Right 2017   CORONARY PRESSURE/FFR STUDY N/A 06/27/2016   Procedure: Intravascular Pressure Wire/FFR Study;  Surgeon: Marykay Lex, MD;  Location: [facility] INVASIVE CV LAB;  Service: Cardiovascular;  Laterality: N/A;   CORONARY STENT INTERVENTION N/A 06/27/2016   Procedure: Coronary Stent Intervention;  Surgeon: Marykay Lex, MD;  Location: [facility] INVASIVE CV LAB;  Service: Cardiovascular;  Laterality: N/A;   EYE SURGERY Right    Cataract  INGUINAL HERNIA REPAIR Bilateral 1990s   LEFT HEART CATH AND CORONARY ANGIOGRAPHY N/A 06/27/2016   Procedure: Left Heart Cath and Coronary Angiography;  Surgeon: Marykay Lex, MD;  Location: [facility] INVASIVE CV LAB;  Service: Cardiovascular;  Laterality: N/A;   LUMBAR LAMINECTOMY/DECOMPRESSION MICRODISCECTOMY Left 01/05/2021   Procedure: Left L4-5 Gill Decompression;  Surgeon: Venita Lick, MD;  Location: [facility] OR;  Service: Orthopedics;  Laterality: Left;   REATTACHMENT HAND Left ~ 2001   w/carpal tunnel release   UMBILICAL HERNIA REPAIR  1990s   w/I[facility]   Family History  Problem Relation Age of Onset   COPD Mother    Lymphoma Father    Social History   Socioeconomic History   Marital status: Married    Spouse name: Misty Stanley   Number of children: Not on file   Years of education: Not on file   Highest education level: 12th grade  Occupational History   Occupation: Disabled    Employer: DISABLED  Tobacco Use   Smoking status: Former    Current packs/day: 0.00    Average packs/day: 0.1 packs/day for 3.0 years (0.3 ttl pk-yrs)    Types: Cigarettes    Start date: 54    Quit date: 05/15/1984    Years since quitting: 39.2   Smokeless tobacco: Never  Vaping Use   Vaping status: Never Used  Substance and Sexual Activity    Alcohol use: No   Drug use: No   Sexual activity: Not Currently  Other Topics Concern   Not on file  Social History Narrative   Lives with wife and youngest son   Right Hand   Drinks no caffeine   Social Drivers of Health   Financial Resource Strain: Medium Risk (07/18/2023)   Overall Financial Resource Strain (CARDIA)    Difficulty of Paying Living Expenses: Somewhat hard  Food Insecurity: Food Insecurity Present (07/18/2023)   Hunger Vital Sign    Worried About Running Out of Food in the Last Year: Sometimes true    Ran Out of Food in the Last Year: Never true  Transportation Needs: No Transportation Needs (07/18/2023)   PRAPARE - Administrator, Civil Service (Medical): No    Lack of Transportation (Non-Medical): No  Physical Activity: Inactive (07/18/2023)   Exercise Vital Sign    Days of Exercise per Week: 0 days    Minutes of Exercise per Session: 0 min  Stress: No Stress Concern Present (07/18/2023)   [employer] of Occupational Health - Occupational Stress Questionnaire    Feeling of Stress : Only a little  Recent Concern: Stress - Stress Concern Present (06/08/2023)   [employer] of Occupational Health - Occupational Stress Questionnaire    Feeling of Stress : To some extent  Social Connections: Socially Isolated (07/18/2023)   Social Connection and Isolation Panel [NHANES]    Frequency of Communication with Friends and Family: Never    Frequency of Social Gatherings with Friends and Family: Never    Attends Religious Services: Never    Database administrator or Organizations: No    Attends Engineer, structural: Never    Marital Status: Married    Tobacco Counseling Counseling given: Not Answered   Clinical Intake:  Pre-visit preparation completed: Yes  Pain : 0-10 Pain Score: 1  Pain Type: Chronic pain Pain Location: Shoulder Pain Orientation: Left Pain Descriptors / Indicators: Aching, Dull     Diabetes: Yes CBG done?:  No Did pt. bring in CBG monitor from home?:  No  How often do you need to have someone help you when you read instructions, pamphlets, or other written materials from your doctor or pharmacy?: 1 - Never  Interpreter Needed?: No  Information entered by :: Remi Haggard LPN   Activities of Daily Living    07/18/2023   12:28 PM  In your present state of health, do you have any difficulty performing the following activities:  Hearing? 1  Vision? 0  Difficulty concentrating or making decisions? 0  Walking or climbing stairs? 0  Dressing or bathing? 0  Doing errands, shopping? 0  Preparing Food and eating ? N  Using the Toilet? N  In the past six months, have you accidently leaked urine? N  Do you have problems with loss of bowel control? N  Managing your Medications? N  Managing your Finances? N  Housekeeping or managing your Housekeeping? N    Patient Care Team: [name], MD as PCP - General (Family Medicine) Wendall Stade, MD as PCP - Cardiology (Cardiology) Romero Belling, MD (Inactive) as Consulting Physician (Endocrinology) Milagros Evener, MD as Consulting Physician (Psychiatry) Nyoka Cowden, MD as Consulting Physician (Pulmonary Disease) Coralyn Helling, MD (Inactive) as Consulting Physician (Pulmonary Disease) Crista Elliot, MD as Consulting Physician (Urology)  Indicate any recent Medical Services you may have received from other than [facility] providers in the past year (date may be approximate).     Assessment:   This is a routine wellness examination for [place].  Hearing/Vision screen Hearing Screening - Comments:: Left ear trouble Does not wear hearing aids Vision Screening - Comments:: Up to date [facility]   Goals Addressed             This Visit's Progress    Patient Stated       Would like to just be hear       Depression Screen    07/18/2023   12:23 PM 05/30/2023    8:56 AM 02/21/2023    8:54 AM 02/07/2023    8:39 AM 10/19/2022    8:29 AM  05/10/2022    2:04 PM 11/24/2021   10:34 AM  PHQ 2/9 Scores  PHQ - 2 Score 4 4 2 2 1 2 4   PHQ- 9 Score 12 9 7 4 2 5 20     Fall Risk    07/18/2023   12:13 PM 05/30/2023    8:55 AM 02/21/2023    8:55 AM 10/19/2022    8:28 AM 05/10/2022    2:06 PM  Fall Risk   Falls in the past year? 0 0 1 1 0  Number falls in past yr: 0  1 1 0  Injury with Fall? 0  0 0 0  Risk for fall due to :   No Fall Risks History of fall(s) No Fall Risks  Follow up Falls evaluation completed;Education provided;Falls prevention discussed Falls evaluation completed Falls evaluation completed Falls evaluation completed Falls evaluation completed    MEDICARE RISK AT HOME: Medicare Risk at Home Any stairs in or around the home?: No If so, are there any without handrails?: No Home free of loose throw rugs in walkways, pet beds, electrical cords, etc?: Yes Adequate lighting in your home to reduce risk of falls?: Yes Life alert?: No Use of a cane, walker or w/c?: No Grab bars in the bathroom?: No Shower chair or bench in shower?: No Elevated toilet seat or a handicapped toilet?: Yes  TIMED UP AND GO:  Was the test performed?  No    Cognitive Function:        07/18/2023   12:17 PM 05/10/2022    2:06 PM 05/05/2021    1:04 PM  6CIT Screen  What Year? 0 points 0 points 0 points  What month? 0 points 0 points 0 points  What time? 0 points 0 points 0 points  Count back from 20 2 points 0 points 0 points  Months in reverse 4 points 4 points 2 points  Repeat phrase 6 points 0 points 0 points  Total Score 12 points 4 points 2 points    Immunizations Immunization History  Administered Date(s) Administered   Influenza, Seasonal, Injecte, Preservative Fre 02/07/2023   Influenza,inj,Quad PF,6+ Mos 06/23/2015, 01/10/2018, 01/17/2019, 01/20/2020, 04/14/2022   PFIZER(Purple Top)SARS-COV-2 Vaccination 12/06/2019, 12/27/2019   PNEUMOCOCCAL CONJUGATE-20 04/29/2021   Pneumococcal Polysaccharide-23 10/07/2010   Td  12/17/2008   Td (Adult), 2 Lf Tetanus Toxid, Preservative Free 12/17/2008   Tdap 01/17/2019, 01/22/2020   Zoster Recombinant(Shingrix) 01/22/2020, 04/29/2021    TDAP status: Up to date  Flu Vaccine status: Up to date  Pneumococcal vaccine status: Up to date  Covid-19 vaccine status: Declined, Education has been provided regarding the importance of this vaccine but patient still declined. Advised may receive this vaccine at local pharmacy or Health Dept.or vaccine clinic. Aware to provide a copy of the vaccination record if obtained from local pharmacy or Health Dept. Verbalized acceptance and understanding.  Qualifies for Shingles Vaccine? No   Zostavax completed Yes   Shingrix Completed?: Yes  Screening Tests Health Maintenance  Topic Date Due   Hepatitis C Screening  Never done   COVID-19 Vaccine (3 - Pfizer risk series) 08/03/2023 (Originally 01/24/2020)   Diabetic kidney evaluation - Urine ACR  10/19/2023   FOOT EXAM  10/19/2023   HEMOGLOBIN A1C  11/27/2023   OPHTHALMOLOGY EXAM  02/27/2024   Fecal DNA (Cologuard)  05/13/2024   Diabetic kidney evaluation - eGFR measurement  05/29/2024   Medicare Annual Wellness (AWV)  07/17/2024   DTaP/Tdap/Td (5 - Td or Tdap) 01/21/2030   Pneumococcal Vaccine 48-30 Years old  Completed   INFLUENZA VACCINE  Completed   Zoster Vaccines- Shingrix  Completed   HPV VACCINES  Aged Out   Colonoscopy  Discontinued   HIV Screening  Discontinued    Health Maintenance  Health Maintenance Due  Topic Date Due   Hepatitis C Screening  Never done    Colorectal cancer screening: Type of screening: Cologuard. Completed 2022. Repeat every 3 years  Lung Cancer Screening: (Low Dose CT Chest recommended if Age 61-80 years, 20 pack-year currently smoking OR have quit w/in 15years.) does not qualify.   Lung Cancer Screening Referral:   Additional Screening:  Hepatitis C Screening:never done  Vision Screening: Recommended annual ophthalmology exams  for early detection of glaucoma and other disorders of the eye. Is the patient up to date with their annual eye exam?  Yes  Who is the provider or what is the name of the office in which the patient attends annual eye exams? [facility] If pt is not established with a provider, would they like to be referred to a provider to establish care? No .   Dental Screening: Recommended annual dental exams for proper oral hygiene  Nutrition Risk Assessment:  Has the patient had any N/V/D within the last 2 months?  No  Does the patient have any non-healing wounds?  No  Has the patient had any unintentional weight loss or weight gain?  No  Diabetes:  Is the patient diabetic?  Yes  If diabetic, was a CBG obtained today?  No  Did the patient bring in their glucometer from home?  No  How often do you monitor your CBG's? Monitor.   Financial Strains and Diabetes Management:  Are you having any financial strains with the device, your supplies or your medication? No .  Does the patient want to be seen by Chronic Care Management for management of their diabetes?  No  Would the patient like to be referred to a Nutritionist or for Diabetic Management?  No   Diabetic Exams:  Diabetic Eye Exam: Completed . Pt has been advised about the importance in completing this exam.   Diabetic Foot Exam: Pt has been advised about the importance in completing this exam.    Community Resource Referral / Chronic Care Management: CRR required this visit?  No   CCM required this visit?  No     Plan:     I have personally reviewed and noted the following in the patient's chart:   Medical and social history Use of alcohol, tobacco or illicit drugs  Current medications and supplements including opioid prescriptions. Patient is currently taking opioid prescriptions. Information provided to patient regarding non-opioid alternatives. Patient advised to discuss non-opioid treatment plan with their provider. Functional  ability and status Nutritional status Physical activity Advanced directives List of other physicians Hospitalizations, surgeries, and ER visits in previous 12 months Vitals Screenings to include cognitive, depression, and falls Referrals and appointments  In addition, I have reviewed and discussed with patient certain preventive protocols, quality metrics, and best practice recommendations. A written personalized care plan for preventive services as well as general preventive health recommendations were provided to patient.     Remi Haggard, LPN   12/14/9560   After Visit Summary: (MyChart) Due to this being a telephonic visit, the after visit summary with patients personalized plan was offered to patient via MyChart   Nurse Notes:

## 2023-07-31 ENCOUNTER — Other Ambulatory Visit: Payer: Self-pay | Admitting: Family Medicine

## 2023-08-03 ENCOUNTER — Other Ambulatory Visit: Payer: Self-pay

## 2023-08-03 MED ORDER — FENOFIBRATE 145 MG PO TABS: 145.0000 mg | ORAL_TABLET | Freq: Every day | ORAL | 0 refills | Status: DC

## 2023-08-06 ENCOUNTER — Other Ambulatory Visit (HOSPITAL_COMMUNITY): Payer: Self-pay

## 2023-08-06 ENCOUNTER — Telehealth: Payer: Self-pay

## 2023-08-06 NOTE — Telephone Encounter (Signed)
 Pharmacy Patient Advocate Encounter   Received notification from Pt Calls Messages that prior authorization for Trulicity 4.5mg /0.64ml is required/requested.   Insurance verification completed.   The patient is insured through [employer] .   Per test claim: PA required; PA submitted to above mentioned insurance via CoverMyMeds Key/confirmation #/EOC [id number] Status is pending

## 2023-08-06 NOTE — Telephone Encounter (Signed)
 Waiting for insurance determination.

## 2023-08-06 NOTE — Telephone Encounter (Signed)
 Pharmacy Patient Advocate Encounter  Received notification from [facility] that Prior Authorization for Trulicity 4.5mg /0.89ml has been APPROVED from 05/16/23 to 05/14/24   PA #/Case ID/Reference #: [id number]

## 2023-08-06 NOTE — Telephone Encounter (Signed)
 Please assist with PA.

## 2023-08-07 NOTE — Telephone Encounter (Signed)
Pharmacy will notify patient

## 2023-08-12 ENCOUNTER — Other Ambulatory Visit: Payer: Self-pay | Admitting: Family Medicine

## 2023-08-20 ENCOUNTER — Other Ambulatory Visit: Payer: Self-pay | Admitting: Family Medicine

## 2023-08-21 ENCOUNTER — Telehealth: Payer: Self-pay

## 2023-08-21 NOTE — Telephone Encounter (Signed)
 Yes this is fine.

## 2023-08-21 NOTE — Telephone Encounter (Signed)
 Copied from CRM [id number]. Topic: Clinical - Medication Question >> Aug 21, 2023 12:21 PM Sonny Dandy B wrote: Reason for CRM: pt called to request a cortisone shot on his next visit on  4/15 pt asked if provider will give him a call back [phone]  Pt is requesting repeat injection for left shoulder pain. Please advise if this can be done during appt

## 2023-08-22 NOTE — Telephone Encounter (Signed)
 LVM for pt to return call.  If call returned, please inform we will be able to do injection during upcoming appt on 4/15.

## 2023-08-28 ENCOUNTER — Ambulatory Visit: Payer: Medicare Other | Admitting: Family Medicine

## 2023-08-28 ENCOUNTER — Other Ambulatory Visit: Payer: Self-pay | Admitting: Family Medicine

## 2023-08-28 ENCOUNTER — Encounter: Payer: Self-pay | Admitting: Family Medicine

## 2023-08-28 VITALS — BP 113/79 | HR 68 | Temp 97.5°F | Ht 71.0 in | Wt 249.2 lb

## 2023-08-28 DIAGNOSIS — I10 Essential (primary) hypertension: Secondary | ICD-10-CM

## 2023-08-28 DIAGNOSIS — M7542 Impingement syndrome of left shoulder: Secondary | ICD-10-CM | POA: Diagnosis not present

## 2023-08-28 DIAGNOSIS — M25512 Pain in left shoulder: Secondary | ICD-10-CM

## 2023-08-28 DIAGNOSIS — Z7984 Long term (current) use of oral hypoglycemic drugs: Secondary | ICD-10-CM | POA: Diagnosis not present

## 2023-08-28 DIAGNOSIS — E1121 Type 2 diabetes mellitus with diabetic nephropathy: Secondary | ICD-10-CM | POA: Diagnosis not present

## 2023-08-28 DIAGNOSIS — E782 Mixed hyperlipidemia: Secondary | ICD-10-CM | POA: Diagnosis not present

## 2023-08-28 DIAGNOSIS — G8929 Other chronic pain: Secondary | ICD-10-CM

## 2023-08-28 DIAGNOSIS — Z125 Encounter for screening for malignant neoplasm of prostate: Secondary | ICD-10-CM

## 2023-08-28 DIAGNOSIS — M25511 Pain in right shoulder: Secondary | ICD-10-CM | POA: Diagnosis not present

## 2023-08-28 DIAGNOSIS — N1831 Chronic kidney disease, stage 3a: Secondary | ICD-10-CM

## 2023-08-28 LAB — COMPREHENSIVE METABOLIC PANEL WITH GFR
ALT: 26 U/L (ref 0–53)
AST: 19 U/L (ref 0–37)
Albumin: 4.8 g/dL (ref 3.5–5.2)
Alkaline Phosphatase: 47 U/L (ref 39–117)
BUN: 16 mg/dL (ref 6–23)
CO2: 27 meq/L (ref 19–32)
Calcium: 10.1 mg/dL (ref 8.4–10.5)
Chloride: 99 meq/L (ref 96–112)
Creatinine, Ser: 1.22 mg/dL (ref 0.40–1.50)
GFR: 65.97 mL/min (ref >60.00)
Glucose, Bld: 145 mg/dL — ABNORMAL HIGH (ref 70–99)
Potassium: 4.3 meq/L (ref 3.5–5.1)
Sodium: 135 meq/L (ref 135–145)
Total Bilirubin: 0.7 mg/dL (ref 0.2–1.2)
Total Protein: 7.3 g/dL (ref 6.0–8.3)

## 2023-08-28 LAB — LIPID PANEL
Cholesterol: 240 mg/dL — ABNORMAL HIGH (ref 0–200)
HDL: 65.7 mg/dL (ref >39.00)
LDL Cholesterol: 128 mg/dL — ABNORMAL HIGH (ref 0–99)
NonHDL: 174.38
Total CHOL/HDL Ratio: 4
Triglycerides: 230 mg/dL — ABNORMAL HIGH (ref 0.0–149.0)
VLDL: 46 mg/dL — ABNORMAL HIGH (ref 0.0–40.0)

## 2023-08-28 LAB — PSA, MEDICARE: PSA: 0.28 ng/mL (ref 0.10–4.00)

## 2023-08-28 LAB — POCT GLYCOSYLATED HEMOGLOBIN (HGB A1C)
HbA1c POC (<> result, manual entry): 7.8 % (ref 4.0–5.6)
HbA1c, POC (controlled diabetic range): 7.8 % — AB (ref 0.0–7.0)
HbA1c, POC (prediabetic range): 7.8 % — AB (ref 5.7–6.4)
Hemoglobin A1C: 7.8 % — AB (ref 4.0–5.6)

## 2023-08-28 LAB — MICROALBUMIN / CREATININE URINE RATIO
Creatinine,U: 87 mg/dL
Microalb Creat Ratio: 10.2 mg/g (ref 0.0–30.0)
Microalb, Ur: 0.9 mg/dL (ref 0.0–1.9)

## 2023-08-28 MED ORDER — FLUOXETINE HCL 40 MG PO CAPS: 40.0000 mg | ORAL_CAPSULE | Freq: Every day | ORAL | 1 refills | Status: DC

## 2023-08-28 MED ORDER — LOSARTAN POTASSIUM 100 MG PO TABS: 50.0000 mg | ORAL_TABLET | Freq: Every day | ORAL | 1 refills | Status: DC

## 2023-08-28 MED ORDER — FLUTICASONE PROPIONATE 50 MCG/ACT NA SUSP
NASAL | 3 refills | Status: AC
Start: 2023-08-28 — End: ?

## 2023-08-28 MED ORDER — ROSUVASTATIN CALCIUM 20 MG PO TABS: 20.0000 mg | ORAL_TABLET | Freq: Every day | ORAL | 1 refills | Status: DC

## 2023-08-28 MED ORDER — HYDROCHLOROTHIAZIDE 12.5 MG PO TABS: ORAL_TABLET | ORAL | 1 refills | Status: DC

## 2023-08-28 MED ORDER — TOUJEO SOLOSTAR 300 UNIT/ML ~~LOC~~ SOPN: PEN_INJECTOR | SUBCUTANEOUS | Status: DC

## 2023-08-28 MED ORDER — TRIAMCINOLONE ACETONIDE 40 MG/ML IJ SUSP
40.0000 mg | Freq: Once | INTRAMUSCULAR | Status: AC
  Administered 2023-08-28: 40 mg via INTRA_ARTICULAR

## 2023-08-28 MED ORDER — TRULICITY 4.5 MG/0.5ML ~~LOC~~ SOAJ: 4.5000 mg | SUBCUTANEOUS | 3 refills | Status: AC

## 2023-08-28 MED ORDER — EMPAGLIFLOZIN 25 MG PO TABS: 25.0000 mg | ORAL_TABLET | Freq: Every day | ORAL | 1 refills | Status: DC

## 2023-08-28 MED ORDER — FENOFIBRATE 145 MG PO TABS: 145.0000 mg | ORAL_TABLET | Freq: Every day | ORAL | 1 refills | Status: DC

## 2023-08-28 MED ORDER — DULOXETINE HCL 30 MG PO CPEP: 30.0000 mg | ORAL_CAPSULE | Freq: Every day | ORAL | 1 refills | Status: DC

## 2023-08-28 MED ORDER — DIAZEPAM 10 MG PO TABS: 10.0000 mg | ORAL_TABLET | Freq: Two times a day (BID) | ORAL | 1 refills | Status: DC

## 2023-08-28 MED ORDER — METHOCARBAMOL 500 MG PO TABS: 500.0000 mg | ORAL_TABLET | Freq: Two times a day (BID) | ORAL | 1 refills | Status: DC

## 2023-08-28 MED ORDER — GABAPENTIN 300 MG PO CAPS: 300.0000 mg | ORAL_CAPSULE | Freq: Two times a day (BID) | ORAL | 1 refills | Status: DC

## 2023-08-28 NOTE — Progress Notes (Signed)
 OFFICE VISIT  08/28/2023  CC:  Chief Complaint  Patient presents with   Medical Management of Chronic Issues    Pt is fasting    Patient is a 58 y.o. male who presents for 72-month follow-up diabetes, hypertension, hyperlipidemia, and chronic renal insufficiency stage III.  He wants to evaluate his left shoulder as well. A/P as of last visit: "#1 diabetes with diabetic nephropathy Increase Toujeo to 10 units daily. Continue Trulicity 4.5 mg q. 7 days, Jardiance 25 mg a day, and metformin XR 1000 mg twice daily.   2. hypertension, well-controlled on losartan one half of 100 mg tab daily, 12.5 mg HCTZ daily. Electrolytes and creatinine today.   3.  Chronic renal insufficiency stage III. Avoid NSAIDs. Electrolytes and creatinine today.   #4  mixed hyperlipidemia, doing well on the fenofibrate 145 mg a day and rosuvastatin 20 mg a day. Lipid panel today.   #5 chronic left shoulder pain. He does have impingement syndrome.  He may have a component of osteoarthritis. Left shoulder radiograph ordered today. He will return for shoulder injection."  INTERIM HX: Feels like he is doing okay other than left shoulder pain for about the last month.  Hurts to raise his arm up and out.  Hurts over anterolateral shoulder region. No preceding injury or trauma.  No neck pain or arm weakness or paresthesias. I did a left shoulder subacromial steroid injection on 06/05/2023 and he got good results. 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 A right shoulder radiograph on 07/14/2021 showed no acute osseous abnormality.  Home glucoses in the 160 range fasting and around 220 range postprandial.  LDL was elevated last visit so we increased his rosuvastatin to 40 mg a day.  ROS as above, plus--> no fevers, no CP, no SOB, no wheezing, no cough, no dizziness, no HAs, no rashes, no melena/hematochezia.  No polyuria or polydipsia.  No myalgias.  No focal weakness, paresthesias, or tremors.  No acute  vision or hearing abnormalities.  No dysuria or unusual/new urinary urgency or frequency.  No recent changes in lower legs. No n/v/d or abd pain.  No palpitations.    Past Medical History:  Diagnosis Date   ANEMIA, PERNICIOUS 04/03/2007   Anxiety and depression    Arthritis    "left hand" (06/26/2016)   Chronic lower back pain    grd I degen spondylolisthesis at L4-5 w/associated synovial cyst at facet at this level +underlying severe epidural lipomatosis L5-S1 up to L4-5--plan for surg w/Dr. Vaughn Georges 12/2020.   Chronic renal insufficiency, stage 3 (moderate) (HCC) 2022   GFR around 60   Colon cancer screening 10/27/2016   10/2016 and 04/2021 NEG   CORONARY ARTERY DISEASE 12/05/2006   DIABETES MELLITUS, TYPE II dx'd 2001   DVT (deep venous thrombosis) (HCC) 2016   BLE   GERD (gastroesophageal reflux disease)    History of balanitis    x 2. Holding off on circ as of 05/2020 urol f/u   History of blood transfusion 2001   "when I had my hand OR"   History of gout    History of kidney stones    HYPERCHOLESTEROLEMIA 07/17/2007   HYPERTENSION 12/05/2006   Learning disability    Migraine    "1-2/month" (06/26/2016)   Myocardial infarction (HCC) ~ 2003   Obesity, Class II, BMI 35-39.9    OSA on CPAP    Pt cannot recall setting clearly but thinks it is 5 cm h20.     PE (pulmonary embolism)  2016   Peripheral vascular disease (HCC)    Simple renal cyst    X 2 on R kidney (one 5 cm and one 2 cm)   Stroke (HCC) ~ 2006   denies residual on 06/26/2016   TIA (transient ischemic attack) ?06/25/2016   2018 and 01/2021 (MRI brain, MR angio head and neck neg 2022)   VISUAL ACUITY, DECREASED, RIGHT EYE 02/19/2009    Past Surgical History:  Procedure Laterality Date   CARDIAC CATHETERIZATION  1990s; 2012; 06/2016   06/2016 distal LAD DES stent.  EF 55-65%   CARDIOVASCULAR STRESS TEST  06/2016   Echo stress test->"inconclusive"-->cath was done after this   CATARACT EXTRACTION W/ INTRAOCULAR LENS  IMPLANT Right 2017   CORONARY PRESSURE/FFR STUDY N/A 06/27/2016   Procedure: Intravascular Pressure Wire/FFR Study;  Surgeon: Marykay Lex, MD;  Location: [facility] INVASIVE CV LAB;  Service: Cardiovascular;  Laterality: N/A;   CORONARY STENT INTERVENTION N/A 06/27/2016   Procedure: Coronary Stent Intervention;  Surgeon: Marykay Lex, MD;  Location: [facility] INVASIVE CV LAB;  Service: Cardiovascular;  Laterality: N/A;   EYE SURGERY Right    Cataract   INGUINAL HERNIA REPAIR Bilateral 1990s   LEFT HEART CATH AND CORONARY ANGIOGRAPHY N/A 06/27/2016   Procedure: Left Heart Cath and Coronary Angiography;  Surgeon: Marykay Lex, MD;  Location: [facility] INVASIVE CV LAB;  Service: Cardiovascular;  Laterality: N/A;   LUMBAR LAMINECTOMY/DECOMPRESSION MICRODISCECTOMY Left 01/05/2021   Procedure: Left L4-5 Gill Decompression;  Surgeon: Venita Lick, MD;  Location: [facility] OR;  Service: Orthopedics;  Laterality: Left;   REATTACHMENT HAND Left ~ 2001   w/carpal tunnel release   UMBILICAL HERNIA REPAIR  1990s   w/IHR    Outpatient Medications Prior to Visit  Medication Sig Dispense Refill   Accu-Chek Softclix Lancets lancets Contact office to schedule with new provider 100 each 2   bromocriptine (PARLODEL) 2.5 MG tablet Take 1.25 mg by mouth daily.     clopidogrel (PLAVIX) 75 MG tablet TAKE 1 TABLET(75 MG) BY MOUTH DAILY 90 tablet 1   cyanocobalamin (VITAMIN B12) 1000 MCG tablet TAKE 1 TABLET(1000 MCG) BY MOUTH DAILY 30 tablet 0   glucose blood (ACCU-CHEK GUIDE) test strip Use to check glucose 2 times a day. 100 each 5   Insulin Pen Needle 31G X 8 MM MISC Use to administer Toujeo 100 each 5   metFORMIN (GLUCOPHAGE-XR) 500 MG 24 hr tablet Take 2 tablets (1,000 mg total) by mouth 2 (two) times daily. 180 tablet 3   nystatin cream (MYCOSTATIN) Apply 1 application topically 2 (two) times daily. 30 g 1   ondansetron (ZOFRAN) 4 MG tablet Take 1 tablet (4 mg total) by mouth every 8 (eight) hours as needed for nausea or  vomiting. 20 tablet 0   rivaroxaban (XARELTO) 20 MG TABS tablet TAKE 1 TABLET BY MOUTH EVERY DAY BEFORE SUPPER 90 tablet 3   insulin glargine, 1 Unit Dial, (TOUJEO SOLOSTAR) 300 UNIT/ML Solostar Pen 10 units subcu nightly 6 mL 6   EPINEPHrine 0.3 mg/0.3 mL IJ SOAJ injection Inject 0.3 mg into the muscle as needed for anaphylaxis. (Patient not taking: Reported on 08/28/2023) 2 each 1   fluconazole (DIFLUCAN) 150 MG tablet TAKE 1 TABLET BY MOUTH EVERY 7 DAYS (Patient not taking: Reported on 08/28/2023) 4 tablet 1   nitroGLYCERIN (NITROSTAT) 0.4 MG SL tablet Place 1 tablet (0.4 mg total) under the tongue every 5 (five) minutes as needed for chest pain. (Patient not taking: Reported on 08/28/2023) 25  tablet 3   diazepam (VALIUM) 10 MG tablet Take 1 tablet (10 mg total) by mouth in the morning and at bedtime. 180 tablet 1   Dulaglutide (TRULICITY) 4.5 MG/0.5ML SOPN Inject 4.5 mg as directed once a week. 6 mL 3   DULoxetine (CYMBALTA) 30 MG capsule Take 1 capsule (30 mg total) by mouth daily. 90 capsule 1   empagliflozin (JARDIANCE) 25 MG TABS tablet Take 1 tablet (25 mg total) by mouth daily. 90 tablet 1   fenofibrate (TRICOR) 145 MG tablet Take 1 tablet (145 mg total) by mouth daily. 30 tablet 0   FLUoxetine (PROZAC) 40 MG capsule Take 1 capsule (40 mg total) by mouth daily. 60 capsule 0   fluticasone (FLONASE) 50 MCG/ACT nasal spray USE ONE SPRAY IN EACH NOSTRIL TWICE A DAY 48 g 3   gabapentin (NEURONTIN) 300 MG capsule Take 1 capsule (300 mg total) by mouth 2 (two) times daily. 180 capsule 1   hydrochlorothiazide (HYDRODIURIL) 12.5 MG tablet TAKE 1 TABLET BY MOUTH EVERY DAY WITH THE LOSARTAN 30 tablet 0   losartan (COZAAR) 100 MG tablet Take 0.5 tablets (50 mg total) by mouth daily. 45 tablet 1   methocarbamol (ROBAXIN) 500 MG tablet Take 1 tablet (500 mg total) by mouth 2 (two) times daily. 90 tablet 1   rosuvastatin (CRESTOR) 20 MG tablet Take 1 tablet (20 mg total) by mouth daily. Take 1 tablet (20 mg  total) by mouth daily. Please call [phone] to schedule an appointment for future refills. Final attempt. Thank you 90 tablet 3   No facility-administered medications prior to visit.    Allergies  Allergen Reactions   Actos [Pioglitazone] Other (See Comments)    Potential cause of DVT   Bee Venom Anaphylaxis   Klonopin [Clonazepam] Anaphylaxis   Invokana [Canagliflozin] Nausea Only    Nausea/dizzy/bad taste in mouth   Niacin Other (See Comments)    "makes his crazy"    Review of Systems As per HPI  PE:    08/28/2023    8:57 AM 06/05/2023   11:10 AM 06/05/2023    8:14 AM  Vitals with BMI  Height 5\' 11"     Weight 249 lbs 3 oz 252 lbs   BMI 34.77 35.16   Systolic 113 138 098  Diastolic 79 87 72  Pulse 68 80 80     Physical Exam  Gen: Alert, well appearing.  Patient is oriented to person, place, time, and situation. AFFECT: pleasant, lucid thought and speech. Left shoulder with mild tenderness to palpation around the acromion.  No tenderness over the [facility] joint or biceps tendon. Positive Hawkins and Neer's.  Speeds negative.  Yergason's negative.  O'Brien's negative.  Negative drop sign.  Scare croak sign intact.  LABS:  Last CBC Lab Results  Component Value Date   WBC 5.8 04/14/2022   HGB 14.6 04/14/2022   HCT 45.3 04/14/2022   MCV 93.9 04/14/2022   MCH 31.1 01/26/2021   RDW 13.7 04/14/2022   PLT 387.0 04/14/2022   Last metabolic panel Lab Results  Component Value Date   GLUCOSE 184 (H) 05/30/2023   NA 136 05/30/2023   K 4.5 05/30/2023   CL 97 05/30/2023   CO2 28 05/30/2023   BUN 22 05/30/2023   CREATININE 1.17 05/30/2023   GFR 69.48 05/30/2023   CALCIUM 10.6 (H) 05/30/2023   PHOS 4.0 06/23/2015   PROT 6.7 02/07/2023   ALBUMIN 4.2 02/07/2023   BILITOT 0.5 02/07/2023   ALKPHOS 37 (L) 02/07/2023  AST 15 02/07/2023   ALT 20 02/07/2023   ANIONGAP 9 01/26/2021   Last lipids Lab Results  Component Value Date   CHOL 258 (H) 05/30/2023   HDL 75.20  05/30/2023   LDLCALC 140 (H) 05/30/2023   LDLDIRECT 58.0 04/29/2021   TRIG 214.0 (H) 05/30/2023   CHOLHDL 3 05/30/2023   Last hemoglobin A1c Lab Results  Component Value Date   HGBA1C 7.8 (A) 08/28/2023   HGBA1C 7.8 08/28/2023   HGBA1C 7.8 (A) 08/28/2023   HGBA1C 7.8 (A) 08/28/2023   Last thyroid functions Lab Results  Component Value Date   TSH 1.51 03/15/2021   Lab Results  Component Value Date   PSA 0.35 04/29/2021   PSA 0.18 01/20/2020   PSA 0.17 11/04/2018   IMPRESSION AND PLAN:  #1 diabetes POC Hba1c today is 7.8%. Increase toujeo to 15 U every day. Continue Trulicity 4.5 mg q. 7 days, Jardiance 25 mg a day, and metformin XR 1000 mg twice daily. Urine microalbumin/creatinine ordered today.  2. hypertension, well-controlled on losartan one half of 100 mg tab daily, 12.5 mg HCTZ daily. Electrolytes and creatinine today.   3.  Chronic renal insufficiency stage III. Avoid NSAIDs. Electrolytes and creatinine today.   #4  mixed hyperlipidemia. LDL was 140 about 3 months ago.  We increased his rosuvastatin to 40 mg at that time. Continue fenofibrate 145 mg a day. Lipid panel and hepatic panel today.   #5 prostate cancer screening: I ordered his annual PSA today.  #6 chronic left shoulder pain. He does have impingement syndrome.  He may have a component of osteoarthritis contributing to his pain. He opted for subacromial steroid injection today.  We discussed home PT, which she has done in the past.  He is aware of the regimen.  Ultrasound-guided injection is preferred based on studies that show increased duration, increased effect, greater accuracy, decreased procedural pain, increased response rate, and decreased cost with ultrasound-guided versus blind injection. Procedure: Real-time ultrasound guided injection of left shoulder subacromial/subdeltoid bursa. Device: GE [employer] informed consent obtained.  Timeout conducted.  No overlying erythema,  induration, or other signs of local infection. After sterile prep with Betadine, injected 3 mL of 1% plain lidocaine for local anesthesia.  This was followed by a mixture of 40 mg Kenalog and 3 mL of 1% plain lidocaine.  injectate seen filling subacromial/subdeltoid bursa. Patient tolerated the procedure well.  No immediate complications.  Post-injection care discussed. Advised to call if fever/chills, erythema, drainage, or persistent bleeding.  Impression: Technically successful ultrasound-guided injection.   An After Visit Summary was printed and given to the patient.  FOLLOW UP: Return in about 4 weeks (around 09/25/2023) for f/u DM.  Signed:  Arletha Lady, MD           08/28/2023

## 2023-08-28 NOTE — Patient Instructions (Signed)
 Increase your Toujeo insulin to 15 units daily

## 2023-08-31 ENCOUNTER — Other Ambulatory Visit: Payer: Self-pay | Admitting: Family Medicine

## 2023-09-24 ENCOUNTER — Other Ambulatory Visit: Payer: Self-pay | Admitting: Family Medicine

## 2023-09-25 ENCOUNTER — Ambulatory Visit (INDEPENDENT_AMBULATORY_CARE_PROVIDER_SITE_OTHER): Admitting: Family Medicine

## 2023-09-25 ENCOUNTER — Encounter: Payer: Self-pay | Admitting: Family Medicine

## 2023-09-25 VITALS — BP 110/70 | HR 73 | Temp 97.8°F | Ht 71.0 in | Wt 249.0 lb

## 2023-09-25 DIAGNOSIS — Z7984 Long term (current) use of oral hypoglycemic drugs: Secondary | ICD-10-CM

## 2023-09-25 DIAGNOSIS — Z794 Long term (current) use of insulin: Secondary | ICD-10-CM

## 2023-09-25 DIAGNOSIS — Z7985 Long-term (current) use of injectable non-insulin antidiabetic drugs: Secondary | ICD-10-CM

## 2023-09-25 DIAGNOSIS — E119 Type 2 diabetes mellitus without complications: Secondary | ICD-10-CM

## 2023-09-25 NOTE — Progress Notes (Signed)
 OFFICE VISIT  09/25/2023  CC:  Chief Complaint  Patient presents with   Medical Management of Chronic Issues    Patient is a 58 y.o. male who presents for 4-week follow-up diabetes. A/P as of last visit: "#1 diabetes POC Hba1c today is 7.8%. Increase toujeo  to 15 U every day. Continue Trulicity  4.5 mg q. 7 days, Jardiance  25 mg a day, and metformin  XR 1000 mg twice daily. Urine microalbumin/creatinine ordered today.   2. hypertension, well-controlled on losartan  one half of 100 mg tab daily, 12.5 mg HCTZ daily. Electrolytes and creatinine today.   3.  Chronic renal insufficiency stage III. Avoid NSAIDs. Electrolytes and creatinine today.   #4  mixed hyperlipidemia. LDL was 140 about 3 months ago.  We increased his rosuvastatin  to 40 mg at that time. Continue fenofibrate  145 mg a day. Lipid panel and hepatic panel today.   #5 prostate cancer screening: I ordered his annual PSA today.   #6 chronic left shoulder pain. He does have impingement syndrome.  He may have a component of osteoarthritis contributing to his pain. He opted for subacromial steroid injection today.  We discussed home PT, which she has done in the past.  He is aware of the regimen."  INTERIM HX: LDL was 128 last visit.  He had missed about a week of his rosuvastatin  40 mg.  Glucose is staying in the 150's for the most part.  He had 1 episode of hypoglycemia down into the 40s but he did not feel abnormal.  His glucometer alarm went off and he was able to eat something in his came up in the normal range appropriately.  He is happy to say that his left shoulder does not hurt anymore and he can do full range of motion.   Past Medical History:  Diagnosis Date   ANEMIA, PERNICIOUS 04/03/2007   Anxiety and depression    Arthritis    "left hand" (06/26/2016)   Chronic lower back pain    grd I degen spondylolisthesis at L4-5 w/associated synovial cyst at facet at this level +underlying severe epidural  lipomatosis L5-S1 up to L4-5--plan for surg w/Dr. Vaughn Georges 12/2020.   Chronic renal insufficiency, stage 3 (moderate) (HCC) 2022   GFR around 60   Colon cancer screening 10/27/2016   10/2016 and 04/2021 NEG   CORONARY ARTERY DISEASE 12/05/2006   DIABETES MELLITUS, TYPE II dx'd 2001   DVT (deep venous thrombosis) (HCC) 2016   BLE   GERD (gastroesophageal reflux disease)    Hepatic steatosis    History of balanitis    x 2. Holding off on circ as of 05/2020 urol f/u   History of blood transfusion 2001   "when I had my hand OR"   History of gout    History of kidney stones    HYPERCHOLESTEROLEMIA 07/17/2007   HYPERTENSION 12/05/2006   Learning disability    Migraine    "1-2/month" (06/26/2016)   Myocardial infarction (HCC) ~ 2003   Obesity, Class II, BMI 35-39.9    OSA on CPAP    Pt cannot recall setting clearly but thinks it is 5 cm h20.     PE (pulmonary embolism) 2016   Peripheral vascular disease (HCC)    Simple renal cyst    X 2 on R kidney (one 5 cm and one 2 cm)   Stroke (HCC) ~ 2006   denies residual on 06/26/2016   TIA (transient ischemic attack) ?06/25/2016   2018 and 01/2021 (MRI brain, MR angio head  and neck neg 2022)   VISUAL ACUITY, DECREASED, RIGHT EYE 02/19/2009    Past Surgical History:  Procedure Laterality Date   CARDIAC CATHETERIZATION  1990s; 2012; 06/2016   06/2016 distal LAD DES stent.  EF 55-65%   CARDIOVASCULAR STRESS TEST  06/2016   Echo stress test->"inconclusive"-->cath was done after this   CATARACT EXTRACTION W/ INTRAOCULAR LENS IMPLANT Right 2017   CORONARY PRESSURE/FFR STUDY N/A 06/27/2016   Procedure: Intravascular Pressure Wire/FFR Study;  Surgeon: Arleen Lacer, MD;  Location: [facility] INVASIVE CV LAB;  Service: Cardiovascular;  Laterality: N/A;   CORONARY STENT INTERVENTION N/A 06/27/2016   Procedure: Coronary Stent Intervention;  Surgeon: Arleen Lacer, MD;  Location: [facility] INVASIVE CV LAB;  Service: Cardiovascular;  Laterality: N/A;   EYE SURGERY  Right    Cataract   INGUINAL HERNIA REPAIR Bilateral 1990s   LEFT HEART CATH AND CORONARY ANGIOGRAPHY N/A 06/27/2016   Procedure: Left Heart Cath and Coronary Angiography;  Surgeon: Arleen Lacer, MD;  Location: [facility] INVASIVE CV LAB;  Service: Cardiovascular;  Laterality: N/A;   LUMBAR LAMINECTOMY/DECOMPRESSION MICRODISCECTOMY Left 01/05/2021   Procedure: Left L4-5 Gill Decompression;  Surgeon: Mort Ards, MD;  Location: [facility] OR;  Service: Orthopedics;  Laterality: Left;   PET-CT, cardiac     05/2023 LOW RISK   REATTACHMENT HAND Left ~ 2001   w/carpal tunnel release   UMBILICAL HERNIA REPAIR  1990s   w/IHR    Outpatient Medications Prior to Visit  Medication Sig Dispense Refill   methocarbamol  (ROBAXIN ) 500 MG tablet Take 1 tablet (500 mg total) by mouth 2 (two) times daily. 90 tablet 1   nystatin  cream (MYCOSTATIN ) Apply 1 application topically 2 (two) times daily. 30 g 1   rivaroxaban  (XARELTO ) 20 MG TABS tablet TAKE 1 TABLET BY MOUTH EVERY DAY BEFORE SUPPER 90 tablet 3   Accu-Chek Softclix Lancets lancets Contact office to schedule with new provider 100 each 2   bromocriptine  (PARLODEL ) 2.5 MG tablet Take 1.25 mg by mouth daily.     clopidogrel  (PLAVIX ) 75 MG tablet TAKE 1 TABLET(75 MG) BY MOUTH DAILY 90 tablet 1   cyanocobalamin  (VITAMIN B12) 1000 MCG tablet TAKE 1 TABLET(1000 MCG) BY MOUTH DAILY 30 tablet 5   diazepam  (VALIUM ) 10 MG tablet Take 1 tablet (10 mg total) by mouth in the morning and at bedtime. 180 tablet 1   Dulaglutide  (TRULICITY ) 4.5 MG/0.5ML SOAJ Inject 4.5 mg as directed once a week. 6 mL 3   DULoxetine  (CYMBALTA ) 30 MG capsule Take 1 capsule (30 mg total) by mouth daily. 90 capsule 1   empagliflozin  (JARDIANCE ) 25 MG TABS tablet Take 1 tablet (25 mg total) by mouth daily. 90 tablet 1   EPINEPHrine  0.3 mg/0.3 mL IJ SOAJ injection Inject 0.3 mg into the muscle as needed for anaphylaxis. (Patient not taking: Reported on 09/25/2023) 2 each 1   fenofibrate  (TRICOR ) 145  MG tablet Take 1 tablet (145 mg total) by mouth daily. 90 tablet 1   fluconazole  (DIFLUCAN ) 150 MG tablet TAKE 1 TABLET BY MOUTH EVERY 7 DAYS (Patient not taking: Reported on 09/25/2023) 4 tablet 1   FLUoxetine  (PROZAC ) 40 MG capsule Take 1 capsule (40 mg total) by mouth daily. 90 capsule 1   fluticasone  (FLONASE ) 50 MCG/ACT nasal spray USE ONE SPRAY IN EACH NOSTRIL TWICE A DAY 48 g 3   gabapentin  (NEURONTIN ) 300 MG capsule Take 1 capsule (300 mg total) by mouth 2 (two) times daily. 180 capsule 1   glucose blood (  ACCU-CHEK GUIDE) test strip Use to check glucose 2 times a day. 100 each 5   hydrochlorothiazide  (HYDRODIURIL ) 12.5 MG tablet TAKE 1 TABLET BY MOUTH EVERY DAY WITH THE LOSARTAN  90 tablet 1   insulin  glargine, 1 Unit Dial, (TOUJEO  SOLOSTAR) 300 UNIT/ML Solostar Pen 15 units subcu nightly     Insulin  Pen Needle 31G X 8 MM MISC Use to administer Toujeo  100 each 5   losartan  (COZAAR ) 100 MG tablet Take 0.5 tablets (50 mg total) by mouth daily. 45 tablet 1   metFORMIN  (GLUCOPHAGE -XR) 500 MG 24 hr tablet Take 2 tablets (1,000 mg total) by mouth 2 (two) times daily. 180 tablet 3   nitroGLYCERIN  (NITROSTAT ) 0.4 MG SL tablet Place 1 tablet (0.4 mg total) under the tongue every 5 (five) minutes as needed for chest pain. (Patient not taking: Reported on 08/28/2023) 25 tablet 3   ondansetron  (ZOFRAN ) 4 MG tablet Take 1 tablet (4 mg total) by mouth every 8 (eight) hours as needed for nausea or vomiting. (Patient not taking: Reported on 09/25/2023) 20 tablet 0   rosuvastatin  (CRESTOR ) 20 MG tablet Take 1 tablet (20 mg total) by mouth daily. Take 1 tablet (20 mg total) by mouth daily. 90 tablet 1   No facility-administered medications prior to visit.    Allergies  Allergen Reactions   Actos  [Pioglitazone ] Other (See Comments)    Potential cause of DVT   Bee Venom Anaphylaxis   Klonopin [Clonazepam] Anaphylaxis   Invokana  [Canagliflozin ] Nausea Only    Nausea/dizzy/bad taste in mouth   Niacin Other  (See Comments)    "makes his crazy"    Review of Systems As per HPI  PE:    09/25/2023    9:13 AM 08/28/2023    8:57 AM 06/05/2023   11:10 AM  Vitals with BMI  Height 5\' 11"  5\' 11"    Weight 249 lbs 249 lbs 3 oz 252 lbs  BMI 34.74 34.77 35.16  Systolic 110 113 657  Diastolic 70 79 87  Pulse 73 68 80     Physical Exam  General: Alert and well-appearing. Affect is pleasant, lucid thought and speech. No further exam today.  LABS:   Lab Results  Component Value Date   HGBA1C 7.8 (A) 08/28/2023   HGBA1C 7.8 08/28/2023   HGBA1C 7.8 (A) 08/28/2023   HGBA1C 7.8 (A) 08/28/2023   Lab Results  Component Value Date   VITAMINB12 446 04/29/2021   IMPRESSION AND PLAN:  #1 diabetes without complication. Control is improving. Continue  toujeo  15 U every day but okay to increase gradually to get fasting sugar consistently around 100-110 range. Continue Trulicity  4.5 mg q. 7 days, Jardiance  25 mg a day, and metformin  XR 1000 mg twice daily. Next hemoglobin A1c in 2 months.  2.  Left shoulder impingement syndrome. He is pain-free now status post steroid injection 1 month ago.  An After Visit Summary was printed and given to the patient.  FOLLOW UP: Return in about 2 months (around 11/25/2023) for routine chronic illness f/u.  Signed:  Arletha Lady, MD           09/25/2023

## 2023-10-11 ENCOUNTER — Encounter: Payer: Self-pay | Admitting: Family Medicine

## 2023-10-11 ENCOUNTER — Ambulatory Visit: Payer: Self-pay

## 2023-10-11 ENCOUNTER — Encounter: Payer: Self-pay | Admitting: Urgent Care

## 2023-10-11 ENCOUNTER — Ambulatory Visit: Admitting: Urgent Care

## 2023-10-11 VITALS — BP 118/81 | HR 83 | Temp 98.0°F | Wt 243.1 lb

## 2023-10-11 DIAGNOSIS — S0001XA Abrasion of scalp, initial encounter: Secondary | ICD-10-CM

## 2023-10-11 DIAGNOSIS — L089 Local infection of the skin and subcutaneous tissue, unspecified: Secondary | ICD-10-CM

## 2023-10-11 DIAGNOSIS — W57XXXA Bitten or stung by nonvenomous insect and other nonvenomous arthropods, initial encounter: Secondary | ICD-10-CM | POA: Diagnosis not present

## 2023-10-11 DIAGNOSIS — S0006XA Insect bite (nonvenomous) of scalp, initial encounter: Secondary | ICD-10-CM

## 2023-10-11 DIAGNOSIS — L219 Seborrheic dermatitis, unspecified: Secondary | ICD-10-CM | POA: Diagnosis not present

## 2023-10-11 MED ORDER — CEPHALEXIN 500 MG PO CAPS: 500.0000 mg | ORAL_CAPSULE | Freq: Four times a day (QID) | ORAL | 0 refills | Status: AC

## 2023-10-11 MED ORDER — KETOCONAZOLE 2 % EX SHAM: MEDICATED_SHAMPOO | CUTANEOUS | 0 refills | Status: DC

## 2023-10-11 MED ORDER — MUPIROCIN 2 % EX OINT: 1.0000 | TOPICAL_OINTMENT | Freq: Three times a day (TID) | CUTANEOUS | 0 refills | Status: AC

## 2023-10-11 NOTE — Patient Instructions (Signed)
 Please use the topical mupirocin to the bite marks on top of scalp three times daily x 5 days. Take the oral cephalexin 4 times daily until gone to prevent infection.  Use the Nizoral shampoo twice weekly to help with the other areas of the scalp. Lather on in the shower, leave on for 5 minutes, then rinse off.   Please monitor for any changes in your symptoms - worsening headache or dizziness, redness or change in color of the skin around the bite - please notify our office immediately.

## 2023-10-11 NOTE — Telephone Encounter (Signed)
 Copied from CRM [id number]. Topic: Clinical - Red Word Triage >> Oct 11, 2023 11:41 AM Deaijah H wrote: Red Word that prompted transfer to Nurse Triage: Tick or spider bite on top of head.    Chief Complaint: Possible insect bites to top of head, area were bleeding this morning. Itchy. Symptoms: above Frequency: today Pertinent Negatives: Patient denies fever Disposition: [] ED /[] Urgent Care (no appt availability in office) / [x] Appointment(In office/virtual)/ []  [facility]/ [] Home Care/ [] Refused Recommended Disposition /[] [facility]/ []  Follow-up with PCP Additional Notes: agrees with appointment.  Reason for Disposition  [1] Red or very tender (to touch) area AND [2] started over 24 hours after the bite  Answer Assessment - Initial Assessment Questions 1. TYPE of INSECT: "What type of insect was it?"      Unsure 2. ONSET: "When did you get bitten?"      Today 3. LOCATION: "Where is the insect bite located?"      Head - top of head 4. REDNESS: "Is the area red or pink?" If Yes, ask: "What size is area of redness?" (inches or cm). "When did the redness start?"     red 5. PAIN: "Is there any pain?" If Yes, ask: "How bad is it?"  (Scale 1-10; or mild, moderate, severe)     no 6. ITCHING: "Does it itch?" If Yes, ask: "How bad is the itch?"    - MILD: doesn't interfere with normal activities   - MODERATE-SEVERE: interferes with work, school, sleep, or other activities      yes 7. SWELLING: "How big is the swelling?" (inches, cm, or compare to coins)     no 8. OTHER SYMPTOMS: "Do you have any other symptoms?"  (e.g., difficulty breathing, hives)     no 9. PREGNANCY: "Is there any chance you are pregnant?" "When was your last menstrual period?"     N/a  Protocols used: Insect Bite-A-AH

## 2023-10-11 NOTE — Progress Notes (Signed)
 Established Patient Office Visit  Subjective:  Patient ID: Xavier [name]., male    DOB: 1966/02/10  Age: 58 y.o. MRN: [id number]  Chief Complaint  Patient presents with   Insect Bite    Pt noticed something bit him in the top of his head. He is unsure if its a spider or tick. He does report being light headed and a headache    58yo male presents with his wife due to concern of possible inset bite. States this morning he was in his bed, rubbing his hair like he normally does upon awakening. He noticed what felt like a few "bumps" on his scalp near the crown. His wife came over to look and noted the bumps as well, trying to clean them off they started bleeding. Neither pt nor his wife noted an insect nor removed an insect from the scalp. They both have had a large amount of ticks this year, but no tick present on body. Pt states since his wife cleaned the area this mroning, he has had a mild headache located at the area of the bite and has had intermittent bursts of dizziness. He denies rash anywhere else, pre-syncope, anxiety, muscle spasms, fever, chills, NVD, SOB, CP, swelling of tongue or throat. No tx tried prior to visit.    Patient Active Problem List   Diagnosis Date Noted   TIA (transient ischemic attack) 01/25/2021   Spinal stenosis at L4-L5 level 01/05/2021   Lumbar radiculopathy 10/15/2020   Shoulder pain, right 09/19/2018   Vitamin B 12 deficiency 01/10/2018   Type 2 diabetes mellitus without complication, without long-term current use of insulin  (HCC)    Abnormal dobutamine  stress echocardiogram    Chronic back pain 06/15/2016   Encounter for pre-operative respiratory clearance 06/15/2016   Obesity 03/29/2016   OSA (obstructive sleep apnea) 09/06/2015   Hypersomnia with sleep apnea 07/08/2015   RLS (restless legs syndrome) 07/08/2015   Cramp of both lower extremities 06/23/2015   Personal history of DVT (deep vein thrombosis) 06/23/2015   History of snoring 06/23/2015    Excessive somnolence disorder 06/23/2015   Obesity, morbid (HCC) 12/25/2014   Cellulitis 12/17/2013   Amputation, arm/hand, unilateral, traumatic, with complication (HCC) 12/03/2013   Depressive disorder 12/03/2013   Pulmonary embolism (HCC) 11/26/2013   Screening for prostate cancer 09/24/2012   Encounter for long-term (current) use of other medications 09/24/2012   HYPERCHOLESTEROLEMIA 07/17/2007   ANEMIA, PERNICIOUS 04/03/2007   Diabetes (HCC) 12/05/2006   Essential hypertension 12/05/2006   Coronary atherosclerosis 12/05/2006   Past Medical History:  Diagnosis Date   ANEMIA, PERNICIOUS 04/03/2007   Anxiety and depression    Arthritis    "left hand" (06/26/2016)   Chronic lower back pain    grd I degen spondylolisthesis at L4-5 w/associated synovial cyst at facet at this level +underlying severe epidural lipomatosis L5-S1 up to L4-5--plan for surg w/Dr. Vaughn Georges 12/2020.   Chronic renal insufficiency, stage 3 (moderate) (HCC) 2022   GFR around 60   Colon cancer screening 10/27/2016   10/2016 and 04/2021 NEG   CORONARY ARTERY DISEASE 12/05/2006   DIABETES MELLITUS, TYPE II dx'd 2001   DVT (deep venous thrombosis) (HCC) 2016   BLE   GERD (gastroesophageal reflux disease)    Hepatic steatosis    History of balanitis    x 2. Holding off on circ as of 05/2020 urol f/u   History of blood transfusion 2001   "when I had my hand OR"   History of  gout    History of kidney stones    HYPERCHOLESTEROLEMIA 07/17/2007   HYPERTENSION 12/05/2006   Learning disability    Migraine    "1-2/month" (06/26/2016)   Myocardial infarction [facility]) ~ 2003   Obesity, Class II, BMI 35-39.9    OSA on CPAP    Pt cannot recall setting clearly but thinks it is 5 cm h20.     PE (pulmonary embolism) 2016   Peripheral vascular disease (HCC)    Simple renal cyst    X 2 on R kidney (one 5 cm and one 2 cm)   Stroke (HCC) ~ 2006   denies residual on 06/26/2016   TIA (transient ischemic attack) ?06/25/2016    2018 and 01/2021 (MRI brain, MR angio head and neck neg 2022)   VISUAL ACUITY, DECREASED, RIGHT EYE 02/19/2009   Past Surgical History:  Procedure Laterality Date   CARDIAC CATHETERIZATION  1990s; 2012; 06/2016   06/2016 distal LAD DES stent.  EF 55-65%   CARDIOVASCULAR STRESS TEST  06/2016   Echo stress test->"inconclusive"-->cath was done after this   CATARACT EXTRACTION W/ INTRAOCULAR LENS IMPLANT Right 2017   CORONARY PRESSURE/FFR STUDY N/A 06/27/2016   Procedure: Intravascular Pressure Wire/FFR Study;  Surgeon: Arleen Lacer, MD;  Location: [facility] INVASIVE CV LAB;  Service: Cardiovascular;  Laterality: N/A;   CORONARY STENT INTERVENTION N/A 06/27/2016   Procedure: Coronary Stent Intervention;  Surgeon: Arleen Lacer, MD;  Location: [facility] INVASIVE CV LAB;  Service: Cardiovascular;  Laterality: N/A;   EYE SURGERY Right    Cataract   INGUINAL HERNIA REPAIR Bilateral 1990s   LEFT HEART CATH AND CORONARY ANGIOGRAPHY N/A 06/27/2016   Procedure: Left Heart Cath and Coronary Angiography;  Surgeon: Arleen Lacer, MD;  Location: [facility] INVASIVE CV LAB;  Service: Cardiovascular;  Laterality: N/A;   LUMBAR LAMINECTOMY/DECOMPRESSION MICRODISCECTOMY Left 01/05/2021   Procedure: Left L4-5 Gill Decompression;  Surgeon: Mort Ards, MD;  Location: [facility];  Service: Orthopedics;  Laterality: Left;   PET-CT, cardiac     05/2023 LOW RISK   REATTACHMENT HAND Left ~ 2001   w/carpal tunnel release   UMBILICAL HERNIA REPAIR  1990s   w/IHR   Social History   Tobacco Use   Smoking status: Former    Current packs/day: 0.00    Average packs/day: 0.1 packs/day for 3.0 years (0.3 ttl pk-yrs)    Types: Cigarettes    Start date: 32    Quit date: 05/15/1984    Years since quitting: 39.4   Smokeless tobacco: Never  Vaping Use   Vaping status: Never Used  Substance Use Topics   Alcohol use: No   Drug use: No      ROS: as noted in HPI  Objective:     BP 118/81   Pulse 83   Temp 98 F (36.7 C) (Oral)    Wt 243 lb 1.9 oz (110.3 kg)   SpO2 97%   BMI 33.91 kg/m  BP Readings from Last 3 Encounters:  10/11/23 118/81  09/25/23 110/70  08/28/23 113/79   Wt Readings from Last 3 Encounters:  10/11/23 243 lb 1.9 oz (110.3 kg)  09/25/23 249 lb (112.9 kg)  08/28/23 249 lb 3.2 oz (113 kg)      Physical Exam Vitals and nursing note reviewed. Exam conducted with a chaperone present.  Constitutional:      General: He is not in acute distress.    Appearance: Normal appearance. He is not ill-appearing, toxic-appearing or diaphoretic.  HENT:  Mouth/Throat:     Mouth: Mucous membranes are moist.  Eyes:     General: No scleral icterus.       Right eye: No discharge.        Left eye: No discharge.  Cardiovascular:     Rate and Rhythm: Normal rate.  Pulmonary:     Effort: Pulmonary effort is normal. No respiratory distress.  Musculoskeletal:     Cervical back: Normal range of motion. No rigidity.  Lymphadenopathy:     Cervical: No cervical adenopathy.  Skin:    General: Skin is warm.     Findings: Erythema present.     Comments: Scattered areas of erythema and scaling to scalp See picture for abrasion/ puncture to scalp near crown  Neurological:     General: No focal deficit present.     Mental Status: He is alert and oriented to person, place, and time.  Psychiatric:        Mood and Affect: Mood normal.        Behavior: Behavior normal.      No results found for any visits on 10/11/23.    The ASCVD Risk score (Arnett DK, et al., 2019) failed to calculate for the following reasons:   Risk score cannot be calculated because patient has a medical history suggesting prior/existing ASCVD  Assessment & Plan:  Abrasion of scalp with infection, initial encounter -     Mupirocin; Apply 1 Application topically 3 (three) times daily.  Dispense: 22 g; Refill: 0 -     Cephalexin; Take 1 capsule (500 mg total) by mouth 4 (four) times daily for 5 days.  Dispense: 20 capsule; Refill:  0  Insect bite of scalp, initial encounter -     Mupirocin; Apply 1 Application topically 3 (three) times daily.  Dispense: 22 g; Refill: 0 -     Cephalexin; Take 1 capsule (500 mg total) by mouth 4 (four) times daily for 5 days.  Dispense: 20 capsule; Refill: 0  Seborrheic dermatitis of scalp -     Ketoconazole; Apply to scalp twice a week for 8 weeks, or until resolved  Dispense: 120 mL; Refill: 0  Will treat aggressively for cellulitis prevention (pt has hx of this) with PO keflex and topical mupirocin to affected area.  Will also cover other areas of scalp with ketoconazole shampoo. Last Tdap 2021, therefore UTD. Vital signs stable, pt denies sx consistent with black widow bite. Requested pt to monitor closely and notify our office for any abrupt changes.  No follow-ups on file.   Mandy Second, PA

## 2023-10-11 NOTE — Telephone Encounter (Signed)
No further action needed. Pt scheduled.

## 2023-12-14 ENCOUNTER — Encounter: Payer: Self-pay | Admitting: Family Medicine

## 2023-12-14 ENCOUNTER — Ambulatory Visit (INDEPENDENT_AMBULATORY_CARE_PROVIDER_SITE_OTHER): Admitting: Family Medicine

## 2023-12-14 VITALS — BP 128/80 | HR 83 | Temp 98.2°F | Ht 71.0 in | Wt 245.4 lb

## 2023-12-14 DIAGNOSIS — M7542 Impingement syndrome of left shoulder: Secondary | ICD-10-CM

## 2023-12-14 DIAGNOSIS — M25512 Pain in left shoulder: Secondary | ICD-10-CM | POA: Diagnosis not present

## 2023-12-14 DIAGNOSIS — Z7984 Long term (current) use of oral hypoglycemic drugs: Secondary | ICD-10-CM

## 2023-12-14 DIAGNOSIS — I10 Essential (primary) hypertension: Secondary | ICD-10-CM

## 2023-12-14 DIAGNOSIS — T63441A Toxic effect of venom of bees, accidental (unintentional), initial encounter: Secondary | ICD-10-CM

## 2023-12-14 DIAGNOSIS — F411 Generalized anxiety disorder: Secondary | ICD-10-CM

## 2023-12-14 DIAGNOSIS — G8929 Other chronic pain: Secondary | ICD-10-CM | POA: Diagnosis not present

## 2023-12-14 DIAGNOSIS — E7800 Pure hypercholesterolemia, unspecified: Secondary | ICD-10-CM | POA: Diagnosis not present

## 2023-12-14 DIAGNOSIS — Z794 Long term (current) use of insulin: Secondary | ICD-10-CM | POA: Diagnosis not present

## 2023-12-14 DIAGNOSIS — T782XXA Anaphylactic shock, unspecified, initial encounter: Secondary | ICD-10-CM

## 2023-12-14 DIAGNOSIS — E119 Type 2 diabetes mellitus without complications: Secondary | ICD-10-CM

## 2023-12-14 DIAGNOSIS — Z79899 Other long term (current) drug therapy: Secondary | ICD-10-CM | POA: Diagnosis not present

## 2023-12-14 LAB — COMPREHENSIVE METABOLIC PANEL WITH GFR
ALT: 18 U/L (ref 0–53)
AST: 19 U/L (ref 0–37)
Albumin: 4.8 g/dL (ref 3.5–5.2)
Alkaline Phosphatase: 43 U/L (ref 39–117)
BUN: 25 mg/dL — ABNORMAL HIGH (ref 6–23)
CO2: 28 meq/L (ref 19–32)
Calcium: 10.2 mg/dL (ref 8.4–10.5)
Chloride: 95 meq/L — ABNORMAL LOW (ref 96–112)
Creatinine, Ser: 1.34 mg/dL (ref 0.40–1.50)
GFR: 58.82 mL/min — ABNORMAL LOW (ref >60.00)
Glucose, Bld: 170 mg/dL — ABNORMAL HIGH (ref 70–99)
Potassium: 4.7 meq/L (ref 3.5–5.1)
Sodium: 134 meq/L — ABNORMAL LOW (ref 135–145)
Total Bilirubin: 0.6 mg/dL (ref 0.2–1.2)
Total Protein: 7.7 g/dL (ref 6.0–8.3)

## 2023-12-14 LAB — LIPID PANEL
Cholesterol: 157 mg/dL (ref 0–200)
HDL: 65 mg/dL (ref >39.00)
LDL Cholesterol: 53 mg/dL (ref 0–99)
NonHDL: 91.61
Total CHOL/HDL Ratio: 2
Triglycerides: 191 mg/dL — ABNORMAL HIGH (ref 0.0–149.0)
VLDL: 38.2 mg/dL (ref 0.0–40.0)

## 2023-12-14 LAB — POCT GLYCOSYLATED HEMOGLOBIN (HGB A1C)
HbA1c POC (<> result, manual entry): 7.6 % (ref 4.0–5.6)
HbA1c, POC (controlled diabetic range): 7.6 % — AB (ref 0.0–7.0)
HbA1c, POC (prediabetic range): 7.6 % — AB (ref 5.7–6.4)
Hemoglobin A1C: 7.6 % — AB (ref 4.0–5.6)

## 2023-12-14 MED ORDER — EPINEPHRINE 0.3 MG/0.3ML IJ SOAJ
0.3000 mg | INTRAMUSCULAR | 1 refills | Status: AC | PRN
Start: 2023-12-14 — End: ?

## 2023-12-14 MED ORDER — TRIAMCINOLONE ACETONIDE 40 MG/ML IJ SUSP
40.0000 mg | Freq: Once | INTRAMUSCULAR | Status: AC
  Administered 2023-12-14: 40 mg via INTRA_ARTICULAR

## 2023-12-14 MED ORDER — TRIAMCINOLONE ACETONIDE 40 MG/ML IJ SUSP: 40.0000 mg | Freq: Once | INTRAMUSCULAR | Status: DC

## 2023-12-14 NOTE — Patient Instructions (Signed)
 Increase toujeo  insulin  to 14 units per day

## 2023-12-14 NOTE — Addendum Note (Signed)
 Addended by: CLAUDENE SHANDA ORN on: 12/14/2023 02:15 PM   Modules accepted: Orders

## 2023-12-14 NOTE — Progress Notes (Signed)
 OFFICE VISIT  12/14/2023  CC:  Chief Complaint  Patient presents with   Medical Management of Chronic Issues    Patient is a 57 y.o. male who presents for 40-month follow-up diabetes, hypertension, hyperlipidemia. A/P as of last visit: #1 diabetes POC Hba1c today is 7.8%. Increase toujeo  to 15 U every day. Continue Trulicity  4.5 mg q. 7 days, Jardiance  25 mg a day, and metformin  XR 1000 mg twice daily.   2. hypertension, well-controlled on losartan  one half of 100 mg tab daily, 12.5 mg HCTZ daily. Electrolytes and creatinine today.   3.  Chronic renal insufficiency stage III. Avoid NSAIDs. Electrolytes and creatinine today.   #4  mixed hyperlipidemia. LDL was 140 about 3 months ago.  We increased his rosuvastatin  to 40 mg at that time. Continue fenofibrate  145 mg a day. Lipid panel and hepatic panel today.  INTERIM HX: Xavier [name] is feeling well other than return of his left shoulder pain. Hurts to reach out and up.  Hurts to sleep on it.  No neck pain or radiating arm pain or paresthesias. No arm weakness. He requests steroid injection today.  After our last visit he said he did not increase his insulin .  He takes 10 units of Toujeo  daily. Home glucoses ranging anywhere from low 100s to mid 200s. Only occasionally eats breakfast.  Sometimes does not eat well later in the day either.  He has occasionally had a low sugar.  The low sugars have usually been after going to bed at night.  He has been taking his rosuvastatin  40 mg every day as well as his fenofibrate  145 mg daily.  ROS as above, plus--> no burning, tingling, or numbness in feet.   No fevers, no CP, no SOB, no wheezing, no cough, no dizziness, no HAs, no rashes, no melena/hematochezia.  No polyuria or polydipsia.    No focal weakness, paresthesias, or tremors.  No acute vision or hearing abnormalities.  No dysuria or unusual/new urinary urgency or frequency.  No recent changes in lower legs. No n/v/d or abd pain.  No  palpitations.    Past Medical History:  Diagnosis Date   ANEMIA, PERNICIOUS 04/03/2007   Anxiety and depression    Arthritis    left hand (06/26/2016)   Chronic lower back pain    grd I degen spondylolisthesis at L4-5 w/associated synovial cyst at facet at this level +underlying severe epidural lipomatosis L5-S1 up to L4-5--plan for surg w/Dr. Burnetta 12/2020.   Chronic renal insufficiency, stage 3 (moderate) (HCC) 2022   GFR around 60   Colon cancer screening 10/27/2016   10/2016 and 04/2021 NEG   CORONARY ARTERY DISEASE 12/05/2006   DIABETES MELLITUS, TYPE II dx'd 2001   DVT (deep venous thrombosis) (HCC) 2016   BLE   GERD (gastroesophageal reflux disease)    Hepatic steatosis    History of balanitis    x 2. Holding off on circ as of 05/2020 urol f/u   History of blood transfusion 2001   when I had my hand OR   History of gout    History of kidney stones    HYPERCHOLESTEROLEMIA 07/17/2007   HYPERTENSION 12/05/2006   Learning disability    Migraine    1-2/month (06/26/2016)   Myocardial infarction [facility]) ~ 2003   Obesity, Class II, BMI 35-39.9    OSA on CPAP    Pt cannot recall setting clearly but thinks it is 5 cm h20.     PE (pulmonary embolism) 2016   Peripheral  vascular disease (HCC)    Simple renal cyst    X 2 on R kidney (one 5 cm and one 2 cm)   Stroke (HCC) ~ 2006   denies residual on 06/26/2016   TIA (transient ischemic attack) ?06/25/2016   2018 and 01/2021 (MRI brain, MR angio head and neck neg 2022)   VISUAL ACUITY, DECREASED, RIGHT EYE 02/19/2009    Past Surgical History:  Procedure Laterality Date   CARDIAC CATHETERIZATION  1990s; 2012; 06/2016   06/2016 distal LAD DES stent.  EF 55-65%   CARDIOVASCULAR STRESS TEST  06/2016   Echo stress test->inconclusive-->cath was done after this   CATARACT EXTRACTION W/ INTRAOCULAR LENS IMPLANT Right 2017   CORONARY PRESSURE/FFR STUDY N/A 06/27/2016   Procedure: Intravascular Pressure Wire/FFR Study;  Surgeon:  Alm LELON Clay, MD;  Location: [facility] INVASIVE CV LAB;  Service: Cardiovascular;  Laterality: N/A;   CORONARY STENT INTERVENTION N/A 06/27/2016   Procedure: Coronary Stent Intervention;  Surgeon: Alm LELON Clay, MD;  Location: [facility] INVASIVE CV LAB;  Service: Cardiovascular;  Laterality: N/A;   EYE SURGERY Right    Cataract   INGUINAL HERNIA REPAIR Bilateral 1990s   LEFT HEART CATH AND CORONARY ANGIOGRAPHY N/A 06/27/2016   Procedure: Left Heart Cath and Coronary Angiography;  Surgeon: Alm LELON Clay, MD;  Location: [facility] INVASIVE CV LAB;  Service: Cardiovascular;  Laterality: N/A;   LUMBAR LAMINECTOMY/DECOMPRESSION MICRODISCECTOMY Left 01/05/2021   Procedure: Left L4-5 Gill Decompression;  Surgeon: Burnetta Aures, MD;  Location: [facility] OR;  Service: Orthopedics;  Laterality: Left;   PET-CT, cardiac     05/2023 LOW RISK   REATTACHMENT HAND Left ~ 2001   w/carpal tunnel release   UMBILICAL HERNIA REPAIR  1990s   w/IHR    Outpatient Medications Prior to Visit  Medication Sig Dispense Refill   Accu-Chek Softclix Lancets lancets Contact office to schedule with new provider 100 each 2   bromocriptine  (PARLODEL ) 2.5 MG tablet Take 1.25 mg by mouth daily.     clopidogrel  (PLAVIX ) 75 MG tablet TAKE 1 TABLET(75 MG) BY MOUTH DAILY 90 tablet 1   cyanocobalamin  (VITAMIN B12) 1000 MCG tablet TAKE 1 TABLET(1000 MCG) BY MOUTH DAILY 30 tablet 5   diazepam  (VALIUM ) 10 MG tablet Take 1 tablet (10 mg total) by mouth in the morning and at bedtime. 180 tablet 1   Dulaglutide  (TRULICITY ) 4.5 MG/0.5ML SOAJ Inject 4.5 mg as directed once a week. 6 mL 3   DULoxetine  (CYMBALTA ) 30 MG capsule Take 1 capsule (30 mg total) by mouth daily. 90 capsule 1   empagliflozin  (JARDIANCE ) 25 MG TABS tablet Take 1 tablet (25 mg total) by mouth daily. 90 tablet 1   fenofibrate  (TRICOR ) 145 MG tablet Take 1 tablet (145 mg total) by mouth daily. 90 tablet 1   FLUoxetine  (PROZAC ) 40 MG capsule Take 1 capsule (40 mg total) by mouth daily. 90  capsule 1   fluticasone  (FLONASE ) 50 MCG/ACT nasal spray USE ONE SPRAY IN EACH NOSTRIL TWICE A DAY 48 g 3   gabapentin  (NEURONTIN ) 300 MG capsule Take 1 capsule (300 mg total) by mouth 2 (two) times daily. 180 capsule 1   glucose blood (ACCU-CHEK GUIDE) test strip Use to check glucose 2 times a day. 100 each 5   hydrochlorothiazide  (HYDRODIURIL ) 12.5 MG tablet TAKE 1 TABLET BY MOUTH EVERY DAY WITH THE LOSARTAN  90 tablet 1   insulin  glargine, 1 Unit Dial, (TOUJEO  SOLOSTAR) 300 UNIT/ML Solostar Pen 15 units subcu nightly     Insulin   Pen Needle 31G X 8 MM MISC Use to administer Toujeo  100 each 5   losartan  (COZAAR ) 100 MG tablet Take 0.5 tablets (50 mg total) by mouth daily. 45 tablet 1   metFORMIN  (GLUCOPHAGE -XR) 500 MG 24 hr tablet Take 2 tablets (1,000 mg total) by mouth 2 (two) times daily. 180 tablet 3   methocarbamol  (ROBAXIN ) 500 MG tablet Take 1 tablet (500 mg total) by mouth 2 (two) times daily. 90 tablet 1   mupirocin  ointment (BACTROBAN ) 2 % Apply 1 Application topically 3 (three) times daily. 22 g 0   nystatin  cream (MYCOSTATIN ) Apply 1 application topically 2 (two) times daily. 30 g 1   rivaroxaban  (XARELTO ) 20 MG TABS tablet TAKE 1 TABLET BY MOUTH EVERY DAY BEFORE SUPPER 90 tablet 3   rosuvastatin  (CRESTOR ) 20 MG tablet Take 1 tablet (20 mg total) by mouth daily. Take 1 tablet (20 mg total) by mouth daily. 90 tablet 1   fluconazole  (DIFLUCAN ) 150 MG tablet TAKE 1 TABLET BY MOUTH EVERY 7 DAYS (Patient not taking: Reported on 12/14/2023) 4 tablet 1   nitroGLYCERIN  (NITROSTAT ) 0.4 MG SL tablet Place 1 tablet (0.4 mg total) under the tongue every 5 (five) minutes as needed for chest pain. (Patient not taking: Reported on 12/14/2023) 25 tablet 3   ondansetron  (ZOFRAN ) 4 MG tablet Take 1 tablet (4 mg total) by mouth every 8 (eight) hours as needed for nausea or vomiting. (Patient not taking: Reported on 12/14/2023) 20 tablet 0   EPINEPHrine  0.3 mg/0.3 mL IJ SOAJ injection Inject 0.3 mg into the  muscle as needed for anaphylaxis. (Patient not taking: Reported on 12/14/2023) 2 each 1   ketoconazole  (NIZORAL ) 2 % shampoo Apply to scalp twice a week for 8 weeks, or until resolved 120 mL 0   No facility-administered medications prior to visit.    Allergies  Allergen Reactions   Actos  [Pioglitazone ] Other (See Comments)    Potential cause of DVT   Bee Venom Anaphylaxis   Klonopin [Clonazepam] Anaphylaxis   Invokana  [Canagliflozin ] Nausea Only    Nausea/dizzy/bad taste in mouth   Niacin Other (See Comments)    makes his crazy    Review of Systems As per HPI  PE:    12/14/2023   10:25 AM 10/11/2023    1:06 PM 09/25/2023    9:13 AM  Vitals with BMI  Height 5' 11  5' 11  Weight 245 lbs 6 oz 243 lbs 2 oz 249 lbs  BMI 34.24 33.92 34.74  Systolic 128 118 889  Diastolic 80 81 70  Pulse 83 83 73     Physical Exam  General: Alert and well-appearing. Foot exam -  no swelling, tenderness or skin or vascular lesions. Color and temperature is normal. Sensation is intact. Peripheral pulses are palpable. Toenails are normal. Left shoulder with tenderness to palpation in the anterolateral acromion and over the long head biceps tendon. Passive abduction to only 75 degrees.  Positive empty can sign.  Negative drop sign.  Hawkins and Neer's positive.  Speeds negative.  Internal rotation nearly full.  Extension is full.  Arm strength 5 out of 5 proximally and distally.   LABS:  Last CBC Lab Results  Component Value Date   WBC 5.8 04/14/2022   HGB 14.6 04/14/2022   HCT 45.3 04/14/2022   MCV 93.9 04/14/2022   MCH 31.1 01/26/2021   RDW 13.7 04/14/2022   PLT 387.0 04/14/2022   Last metabolic panel Lab Results  Component Value Date  GLUCOSE 145 (H) 08/28/2023   NA 135 08/28/2023   K 4.3 08/28/2023   CL 99 08/28/2023   CO2 27 08/28/2023   BUN 16 08/28/2023   CREATININE 1.22 08/28/2023   GFR 65.97 08/28/2023   CALCIUM  10.1 08/28/2023   PHOS 4.0 06/23/2015   PROT 7.3  08/28/2023   ALBUMIN  4.8 08/28/2023   BILITOT 0.7 08/28/2023   ALKPHOS 47 08/28/2023   AST 19 08/28/2023   ALT 26 08/28/2023   ANIONGAP 9 01/26/2021   Last lipids Lab Results  Component Value Date   CHOL 240 (H) 08/28/2023   HDL 65.70 08/28/2023   LDLCALC 128 (H) 08/28/2023   LDLDIRECT 58.0 04/29/2021   TRIG 230.0 (H) 08/28/2023   CHOLHDL 4 08/28/2023   Last hemoglobin A1c Lab Results  Component Value Date   HGBA1C 7.6 (A) 12/14/2023   HGBA1C 7.6 12/14/2023   HGBA1C 7.6 (A) 12/14/2023   HGBA1C 7.6 (A) 12/14/2023   Last thyroid  functions Lab Results  Component Value Date   TSH 1.51 03/15/2021   Last vitamin B12 and Folate Lab Results  Component Value Date   VITAMINB12 446 04/29/2021   FOLATE 13.6 06/12/2008   IMPRESSION AND PLAN:  #1 diabetes without complication.  Slightly improved control. POC Hba1c today is 7.6%. Feet exam today normal. Increase Toujeo  to 14 units a day. Continue Trulicity  4.5 mg subcu weekly, Jardiance  25 mg daily, and metformin  1000 mg twice daily.  2. hypertension, well-controlled on losartan  one half of 100 mg tab daily, 12.5 mg HCTZ daily. Electrolytes and creatinine today.   3.  Chronic renal insufficiency stage III. Avoid NSAIDs. Electrolytes and creatinine today.   #4  mixed hyperlipidemia. LDL was 128 about 3 months ago.  We held off from adding Zetia at that time. Continue rosuvastatin  40 mg a day and continue fenofibrate  145 mg a day. Lipid panel and hepatic panel today. If LDL not at goal of less than 70 will add Zetia 10 mg a day.  5.  Chronic left shoulder pain.  Impingement syndrome.  He does have mild AC joint and minimal glenohumeral osteoarthritis.  I do not think these are contributing much to his current pain. He has home PT regimen to do. He got very good results from subacromial steroid injections on 06/05/2023 and 08/28/2023. He requests steroid injection today so we proceeded.  Ultrasound-guided injection is  preferred based on studies that show increased duration, increased effect, greater accuracy, decreased procedural pain, increased response rate, and decreased cost with ultrasound-guided versus blind injection. Procedure: Real-time ultrasound guided injection of left shoulder subacromial/subdeltoid bursa. (Limited ultrasound today showed intact biceps tendon without fluid in sheath.  No impingement visualized on dynamic testing, rotator cuff tendon fully intact and without significant thickening or hypoechoic changes.  No bursal fluid.  AC joint with some degenerative changes without geyser sign.  Posterior view of the glenohumeral joint appears normal.) Device: GE LogiQ E Verbal informed consent obtained.  Timeout conducted.  No overlying erythema, induration, or other signs of local infection. After sterile prep with Betadine, injected a mixture of 40 mg Kenalog  and 3 mL of 1% plain lidocaine .  injectate seen filling subacromial/subdeltoid bursa. Patient tolerated the procedure well.  No immediate complications.  Post-injection care discussed. Advised to call if fever/chills, erythema, drainage, or persistent bleeding.   Impression: Technically successful ultrasound-guided injection  An After Visit Summary was printed and given to the patient.  FOLLOW UP: Return in about 3 months (around 03/15/2024) for routine chronic illness  f/u.  Signed:  Gerlene Hockey, MD           12/14/2023

## 2023-12-16 LAB — DRUG MONITORING PANEL 375977 , URINE
Alcohol Metabolites: NEGATIVE ng/mL (ref <500)
Alphahydroxyalprazolam: NEGATIVE ng/mL (ref <25)
Alphahydroxymidazolam: NEGATIVE ng/mL (ref <50)
Alphahydroxytriazolam: NEGATIVE ng/mL (ref <50)
Aminoclonazepam: NEGATIVE ng/mL (ref <25)
Amphetamines: NEGATIVE ng/mL (ref <500)
Barbiturates: NEGATIVE ng/mL (ref <300)
Benzodiazepines: POSITIVE ng/mL — AB (ref <100)
Cocaine Metabolite: NEGATIVE ng/mL (ref <150)
Desmethyltramadol: NEGATIVE ng/mL (ref <100)
Hydroxyethylflurazepam: NEGATIVE ng/mL (ref <50)
Lorazepam: NEGATIVE ng/mL (ref <50)
Marijuana Metabolite: NEGATIVE ng/mL (ref <20)
Nordiazepam: 511 ng/mL — ABNORMAL HIGH (ref <50)
Opiates: NEGATIVE ng/mL (ref <100)
Oxazepam: 1542 ng/mL — ABNORMAL HIGH (ref <50)
Oxycodone: NEGATIVE ng/mL (ref <100)
Temazepam: 2891 ng/mL — ABNORMAL HIGH (ref <50)
Tramadol: NEGATIVE ng/mL (ref <100)

## 2023-12-16 LAB — DM TEMPLATE

## 2023-12-17 ENCOUNTER — Ambulatory Visit: Payer: Self-pay | Admitting: Family Medicine

## 2024-01-05 ENCOUNTER — Other Ambulatory Visit: Payer: Self-pay | Admitting: Cardiovascular Disease

## 2024-01-31 ENCOUNTER — Other Ambulatory Visit: Payer: Self-pay | Admitting: Family Medicine

## 2024-02-11 ENCOUNTER — Other Ambulatory Visit: Payer: Self-pay

## 2024-02-11 MED ORDER — METFORMIN HCL ER 500 MG PO TB24: 1000.0000 mg | ORAL_TABLET | Freq: Two times a day (BID) | ORAL | 1 refills | Status: DC

## 2024-02-20 ENCOUNTER — Other Ambulatory Visit: Payer: Self-pay | Admitting: Family Medicine

## 2024-02-28 ENCOUNTER — Other Ambulatory Visit: Payer: Self-pay | Admitting: Family Medicine

## 2024-03-04 ENCOUNTER — Other Ambulatory Visit: Payer: Self-pay

## 2024-03-04 NOTE — Telephone Encounter (Signed)
 Requesting: Diazepam  Contract: 07/18/23 UDS: 12/14/23 Last Visit: 12/14/23 Next Visit: 03/17/24 Last Refill: 08/28/23 (180,1)  Please Advise. Rx pending

## 2024-03-05 ENCOUNTER — Other Ambulatory Visit: Payer: Self-pay | Admitting: Family Medicine

## 2024-03-05 DIAGNOSIS — E1121 Type 2 diabetes mellitus with diabetic nephropathy: Secondary | ICD-10-CM

## 2024-03-05 MED ORDER — DIAZEPAM 10 MG PO TABS: 10.0000 mg | ORAL_TABLET | Freq: Two times a day (BID) | ORAL | 1 refills | Status: AC

## 2024-03-17 ENCOUNTER — Ambulatory Visit (INDEPENDENT_AMBULATORY_CARE_PROVIDER_SITE_OTHER): Admitting: Family Medicine

## 2024-03-17 ENCOUNTER — Encounter: Payer: Self-pay | Admitting: Family Medicine

## 2024-03-17 VITALS — BP 124/77 | HR 64 | Temp 97.1°F | Ht 71.0 in | Wt 248.8 lb

## 2024-03-17 DIAGNOSIS — Z7984 Long term (current) use of oral hypoglycemic drugs: Secondary | ICD-10-CM

## 2024-03-17 DIAGNOSIS — Z23 Encounter for immunization: Secondary | ICD-10-CM

## 2024-03-17 DIAGNOSIS — Z7985 Long-term (current) use of injectable non-insulin antidiabetic drugs: Secondary | ICD-10-CM | POA: Diagnosis not present

## 2024-03-17 DIAGNOSIS — F411 Generalized anxiety disorder: Secondary | ICD-10-CM | POA: Diagnosis not present

## 2024-03-17 DIAGNOSIS — Z794 Long term (current) use of insulin: Secondary | ICD-10-CM | POA: Diagnosis not present

## 2024-03-17 DIAGNOSIS — I10 Essential (primary) hypertension: Secondary | ICD-10-CM | POA: Diagnosis not present

## 2024-03-17 DIAGNOSIS — N2889 Other specified disorders of kidney and ureter: Secondary | ICD-10-CM | POA: Diagnosis not present

## 2024-03-17 DIAGNOSIS — E119 Type 2 diabetes mellitus without complications: Secondary | ICD-10-CM | POA: Diagnosis not present

## 2024-03-17 DIAGNOSIS — E7800 Pure hypercholesterolemia, unspecified: Secondary | ICD-10-CM | POA: Diagnosis not present

## 2024-03-17 DIAGNOSIS — Z79899 Other long term (current) drug therapy: Secondary | ICD-10-CM

## 2024-03-17 LAB — BASIC METABOLIC PANEL WITH GFR
BUN: 17 mg/dL (ref 6–23)
CO2: 28 meq/L (ref 19–32)
Calcium: 9.5 mg/dL (ref 8.4–10.5)
Chloride: 101 meq/L (ref 96–112)
Creatinine, Ser: 1.13 mg/dL (ref 0.40–1.50)
GFR: 72.04 mL/min (ref >60.00)
Glucose, Bld: 194 mg/dL — ABNORMAL HIGH (ref 70–99)
Potassium: 3.9 meq/L (ref 3.5–5.1)
Sodium: 140 meq/L (ref 135–145)

## 2024-03-17 LAB — LIPID PANEL
Cholesterol: 162 mg/dL (ref 0–200)
HDL: 62.4 mg/dL (ref >39.00)
LDL Cholesterol: 61 mg/dL (ref 0–99)
NonHDL: 99.24
Total CHOL/HDL Ratio: 3
Triglycerides: 191 mg/dL — ABNORMAL HIGH (ref 0.0–149.0)
VLDL: 38.2 mg/dL (ref 0.0–40.0)

## 2024-03-17 LAB — POCT GLYCOSYLATED HEMOGLOBIN (HGB A1C)
HbA1c POC (<> result, manual entry): 8.5 % (ref 4.0–5.6)
HbA1c, POC (controlled diabetic range): 8.5 % — AB (ref 0.0–7.0)
HbA1c, POC (prediabetic range): 8.5 % — AB (ref 5.7–6.4)
Hemoglobin A1C: 8.5 % — AB (ref 4.0–5.6)

## 2024-03-17 LAB — MICROALBUMIN / CREATININE URINE RATIO
Creatinine,U: 48.7 mg/dL
Microalb Creat Ratio: UNDETERMINED mg/g (ref 0.0–30.0)
Microalb, Ur: <0.7 mg/dL

## 2024-03-17 MED ORDER — FLUOXETINE HCL 40 MG PO CAPS: 40.0000 mg | ORAL_CAPSULE | Freq: Every day | ORAL | 3 refills | Status: AC

## 2024-03-17 MED ORDER — HYDROCHLOROTHIAZIDE 12.5 MG PO TABS: ORAL_TABLET | ORAL | 3 refills | Status: AC

## 2024-03-17 MED ORDER — ROSUVASTATIN CALCIUM 20 MG PO TABS: 20.0000 mg | ORAL_TABLET | Freq: Every day | ORAL | 3 refills | Status: AC

## 2024-03-17 MED ORDER — GABAPENTIN 300 MG PO CAPS: 300.0000 mg | ORAL_CAPSULE | Freq: Two times a day (BID) | ORAL | 1 refills | Status: AC

## 2024-03-17 MED ORDER — EMPAGLIFLOZIN 25 MG PO TABS: 25.0000 mg | ORAL_TABLET | Freq: Every day | ORAL | 3 refills | Status: AC

## 2024-03-17 MED ORDER — TOUJEO SOLOSTAR 300 UNIT/ML ~~LOC~~ SOPN: PEN_INJECTOR | SUBCUTANEOUS | Status: DC

## 2024-03-17 MED ORDER — METHOCARBAMOL 500 MG PO TABS
500.0000 mg | ORAL_TABLET | Freq: Two times a day (BID) | ORAL | 1 refills | Status: AC
Start: 2024-03-17 — End: ?

## 2024-03-17 MED ORDER — DULOXETINE HCL 30 MG PO CPEP: 30.0000 mg | ORAL_CAPSULE | Freq: Every day | ORAL | 3 refills | Status: AC

## 2024-03-17 NOTE — Progress Notes (Signed)
 OFFICE VISIT  03/17/2024  CC: No chief complaint on file.   Patient is a 58 y.o. male who presents for 65-month follow-up diabetes, hypertension, and hyperlipidemia. A/P as of last visit: 1 diabetes without complication.  Slightly improved control. POC Hba1c today is 7.6%. Feet exam today normal. Increase Toujeo  to 14 units a day. Continue Trulicity  4.5 mg subcu weekly, Jardiance  25 mg daily, and metformin  1000 mg twice daily.   2. hypertension, well-controlled on losartan  one half of 100 mg tab daily, 12.5 mg HCTZ daily. Electrolytes and creatinine today.   3.  Chronic renal insufficiency stage III. Avoid NSAIDs. Electrolytes and creatinine today.   #4  mixed hyperlipidemia. LDL was 128 about 3 months ago.  We held off from adding Zetia at that time. Continue rosuvastatin  40 mg a day and continue fenofibrate  145 mg a day. Lipid panel and hepatic panel today. If LDL not at goal of less than 70 will add Zetia 10 mg a day.   5.  Chronic left shoulder pain.  Impingement syndrome.  He does have mild AC joint and minimal glenohumeral osteoarthritis.  I do not think these are contributing much to his current pain. He has home PT regimen to do. He got very good results from subacromial steroid injections on 06/05/2023 and 08/28/2023. He requests steroid injection today so we proceeded.  INTERIM HX: All labs stable last visit, hemoglobin A1c 7.6%.  Mildly admits he has not been adhering to a very good diet lately. He has a CGM and glucoses have ranged from 114 up over the 250s. No burning, tingling, or numbness in feet.  He has no acute concerns.  No depression.  Anxiety well-controlled. PMP AWARE reviewed today: most recent rx for diazepam  was filled 03/05/2024, # 60, rx by me. Most recent prescription for gabapentin  was filled 02/09/2024, #180, prescription by me. No red flags.  ROS --> no fevers, no CP, no SOB, no wheezing, no cough, no dizziness, no HAs, no rashes, no  melena/hematochezia.  No polyuria or polydipsia.  No myalgias or arthralgias.  No focal weakness, paresthesias, or tremors.  No acute vision or hearing abnormalities.  No dysuria or unusual/new urinary urgency or frequency.  No recent changes in lower legs. No n/v/d or abd pain.  No palpitations.    Past Medical History:  Diagnosis Date   ANEMIA, PERNICIOUS 04/03/2007   Anxiety and depression    Arthritis    left hand (06/26/2016)   Chronic lower back pain    grd I degen spondylolisthesis at L4-5 w/associated synovial cyst at facet at this level +underlying severe epidural lipomatosis L5-S1 up to L4-5--plan for surg w/Dr. Burnetta 12/2020.   Chronic renal insufficiency, stage 3 (moderate) 2022   GFR around 60   Colon cancer screening 10/27/2016   10/2016 and 04/2021 NEG   CORONARY ARTERY DISEASE 12/05/2006   DIABETES MELLITUS, TYPE II dx'd 2001   DVT (deep venous thrombosis) (HCC) 2016   BLE   GERD (gastroesophageal reflux disease)    Hepatic steatosis    History of balanitis    x 2. Holding off on circ as of 05/2020 urol f/u   History of blood transfusion 2001   when I had my hand OR   History of gout    History of kidney stones    HYPERCHOLESTEROLEMIA 07/17/2007   HYPERTENSION 12/05/2006   Learning disability    Migraine    1-2/month (06/26/2016)   Myocardial infarction [facility]) ~ 2003   Obesity, Class II, BMI  35-39.9    OSA on CPAP    Pt cannot recall setting clearly but thinks it is 5 cm h20.     PE (pulmonary embolism) 2016   Peripheral vascular disease    Simple renal cyst    X 2 on R kidney (one 5 cm and one 2 cm)   Stroke (HCC) ~ 2006   denies residual on 06/26/2016   TIA (transient ischemic attack) ?06/25/2016   2018 and 01/2021 (MRI brain, MR angio head and neck neg 2022)   VISUAL ACUITY, DECREASED, RIGHT EYE 02/19/2009    Past Surgical History:  Procedure Laterality Date   CARDIAC CATHETERIZATION  1990s; 2012; 06/2016   06/2016 distal LAD DES stent.  EF 55-65%    CARDIOVASCULAR STRESS TEST  06/2016   Echo stress test->inconclusive-->cath was done after this   CATARACT EXTRACTION W/ INTRAOCULAR LENS IMPLANT Right 2017   CORONARY PRESSURE/FFR STUDY N/A 06/27/2016   Procedure: Intravascular Pressure Wire/FFR Study;  Surgeon: Alm LELON Clay, MD;  Location: [facility] INVASIVE CV LAB;  Service: Cardiovascular;  Laterality: N/A;   CORONARY STENT INTERVENTION N/A 06/27/2016   Procedure: Coronary Stent Intervention;  Surgeon: Alm LELON Clay, MD;  Location: [facility] INVASIVE CV LAB;  Service: Cardiovascular;  Laterality: N/A;   EYE SURGERY Right    Cataract   INGUINAL HERNIA REPAIR Bilateral 1990s   LEFT HEART CATH AND CORONARY ANGIOGRAPHY N/A 06/27/2016   Procedure: Left Heart Cath and Coronary Angiography;  Surgeon: Alm LELON Clay, MD;  Location: [facility] INVASIVE CV LAB;  Service: Cardiovascular;  Laterality: N/A;   LUMBAR LAMINECTOMY/DECOMPRESSION MICRODISCECTOMY Left 01/05/2021   Procedure: Left L4-5 Gill Decompression;  Surgeon: Burnetta Aures, MD;  Location: [facility] OR;  Service: Orthopedics;  Laterality: Left;   PET-CT, cardiac     05/2023 LOW RISK   REATTACHMENT HAND Left ~ 2001   w/carpal tunnel release   UMBILICAL HERNIA REPAIR  1990s   w/IHR    Outpatient Medications Prior to Visit  Medication Sig Dispense Refill   Accu-Chek Softclix Lancets lancets Contact office to schedule with new provider 100 each 2   bromocriptine  (PARLODEL ) 2.5 MG tablet Take 1.25 mg by mouth daily.     clopidogrel  (PLAVIX ) 75 MG tablet TAKE 1 TABLET(75 MG) BY MOUTH DAILY 90 tablet 1   cyanocobalamin  (VITAMIN B12) 1000 MCG tablet TAKE 1 TABLET(1000 MCG) BY MOUTH DAILY 30 tablet 5   diazepam  (VALIUM ) 10 MG tablet Take 1 tablet (10 mg total) by mouth in the morning and at bedtime. 180 tablet 1   DROPLET PEN NEEDLES 31G X 8 MM MISC USE TO INJECT TOUJEO  EVERY DAY 100 each 5   Dulaglutide  (TRULICITY ) 4.5 MG/0.5ML SOAJ Inject 4.5 mg as directed once a week. 6 mL 3   EPINEPHrine  0.3 mg/0.3 mL IJ  SOAJ injection Inject 0.3 mg into the muscle as needed for anaphylaxis. 2 each 1   fenofibrate  (TRICOR ) 145 MG tablet TAKE 1 TABLET(145 MG) BY MOUTH DAILY 90 tablet 0   fluconazole  (DIFLUCAN ) 150 MG tablet TAKE 1 TABLET BY MOUTH EVERY 7 DAYS (Patient not taking: Reported on 12/14/2023) 4 tablet 1   fluticasone  (FLONASE ) 50 MCG/ACT nasal spray USE ONE SPRAY IN EACH NOSTRIL TWICE A DAY 48 g 3   glucose blood (ACCU-CHEK GUIDE) test strip Use to check glucose 2 times a day. 100 each 5   losartan  (COZAAR ) 100 MG tablet TAKE 1/2 TABLET(50 MG) BY MOUTH DAILY 45 tablet 0   metFORMIN  (GLUCOPHAGE -XR) 500 MG 24 hr tablet Take  2 tablets (1,000 mg total) by mouth 2 (two) times daily. 180 tablet 1   mupirocin  ointment (BACTROBAN ) 2 % Apply 1 Application topically 3 (three) times daily. 22 g 0   nitroGLYCERIN  (NITROSTAT ) 0.4 MG SL tablet DISSOLVE ONE TABLET UNDER TONGUE AS NEEDED FOR CHEST PAIN EVERY 5 MINUTES 25 tablet 3   nystatin  cream (MYCOSTATIN ) Apply 1 application topically 2 (two) times daily. 30 g 1   ondansetron  (ZOFRAN ) 4 MG tablet Take 1 tablet (4 mg total) by mouth every 8 (eight) hours as needed for nausea or vomiting. (Patient not taking: Reported on 12/14/2023) 20 tablet 0   rivaroxaban  (XARELTO ) 20 MG TABS tablet TAKE 1 TABLET BY MOUTH EVERY DAY BEFORE SUPPER 90 tablet 3   DULoxetine  (CYMBALTA ) 30 MG capsule Take 1 capsule (30 mg total) by mouth daily. 90 capsule 1   empagliflozin  (JARDIANCE ) 25 MG TABS tablet Take 1 tablet (25 mg total) by mouth daily. 90 tablet 1   FLUoxetine  (PROZAC ) 40 MG capsule Take 1 capsule (40 mg total) by mouth daily. 90 capsule 1   gabapentin  (NEURONTIN ) 300 MG capsule Take 1 capsule (300 mg total) by mouth 2 (two) times daily. 180 capsule 1   hydrochlorothiazide  (HYDRODIURIL ) 12.5 MG tablet TAKE 1 TABLET BY MOUTH EVERY DAY WITH THE LOSARTAN  30 tablet 0   insulin  glargine, 1 Unit Dial, (TOUJEO  SOLOSTAR) 300 UNIT/ML Solostar Pen 15 units subcu nightly     methocarbamol   (ROBAXIN ) 500 MG tablet Take 1 tablet (500 mg total) by mouth 2 (two) times daily. 90 tablet 1   rosuvastatin  (CRESTOR ) 20 MG tablet Take 1 tablet (20 mg total) by mouth daily. Take 1 tablet (20 mg total) by mouth daily. 90 tablet 1   No facility-administered medications prior to visit.    Allergies  Allergen Reactions   Actos  [Pioglitazone ] Other (See Comments)    Potential cause of DVT   Bee Venom Anaphylaxis   Klonopin [Clonazepam] Anaphylaxis   Invokana  [Canagliflozin ] Nausea Only    Nausea/dizzy/bad taste in mouth   Niacin Other (See Comments)    makes his crazy    Review of Systems As per HPI  PE:    03/17/2024    9:38 AM 12/14/2023   10:25 AM 10/11/2023    1:06 PM  Vitals with BMI  Height 5' 11 5' 11   Weight 248 lbs 13 oz 245 lbs 6 oz 243 lbs 2 oz  BMI 34.72 34.24 33.92  Systolic 124 128 881  Diastolic 77 80 81  Pulse 64 83 83     Physical Exam  Gen: Alert, well appearing.  Patient is oriented to person, place, time, and situation. AFFECT: pleasant, lucid thought and speech. Foot exam -  no swelling, tenderness or skin or vascular lesions. Color and temperature is normal. Sensation is intact. Peripheral pulses are palpable. Toenails are normal.   LABS:  Last CBC Lab Results  Component Value Date   WBC 5.8 04/14/2022   HGB 14.6 04/14/2022   HCT 45.3 04/14/2022   MCV 93.9 04/14/2022   MCH 31.1 01/26/2021   RDW 13.7 04/14/2022   PLT 387.0 04/14/2022   Last metabolic panel Lab Results  Component Value Date   GLUCOSE 170 (H) 12/14/2023   NA 134 (L) 12/14/2023   K 4.7 12/14/2023   CL 95 (L) 12/14/2023   CO2 28 12/14/2023   BUN 25 (H) 12/14/2023   CREATININE 1.34 12/14/2023   GFR 58.82 (L) 12/14/2023   CALCIUM  10.2 12/14/2023  PHOS 4.0 06/23/2015   PROT 7.7 12/14/2023   ALBUMIN  4.8 12/14/2023   BILITOT 0.6 12/14/2023   ALKPHOS 43 12/14/2023   AST 19 12/14/2023   ALT 18 12/14/2023   ANIONGAP 9 01/26/2021   Last lipids Lab Results   Component Value Date   CHOL 157 12/14/2023   HDL 65.00 12/14/2023   LDLCALC 53 12/14/2023   LDLDIRECT 58.0 04/29/2021   TRIG 191.0 (H) 12/14/2023   CHOLHDL 2 12/14/2023   Last hemoglobin A1c Lab Results  Component Value Date   HGBA1C 8.5 (A) 03/17/2024   HGBA1C 8.5 03/17/2024   HGBA1C 8.5 (A) 03/17/2024   HGBA1C 8.5 (A) 03/17/2024   Last thyroid  functions Lab Results  Component Value Date   TSH 1.51 03/15/2021   FREET4 0.99 03/15/2021   Last vitamin B12 and Folate Lab Results  Component Value Date   VITAMINB12 446 04/29/2021   FOLATE 13.6 06/12/2008   IMPRESSION AND PLAN:  1 diabetes without complication.  Poor control. POC Hba1c today is 8.5%. Feet exam today normal. Increase Toujeo  to 20 units a day. Continue Trulicity  4.5 mg subcu weekly, Jardiance  25 mg daily, and metformin  1000 mg twice daily. He will work harder on a diabetic diet. Check renal function and urine microalbumin/creatinine today.   2. hypertension, well-controlled on losartan  one half of 100 mg tab daily, 12.5 mg HCTZ daily. Electrolytes and creatinine today.   3.  Chronic renal insufficiency stage III. Avoid NSAIDs. Electrolytes and creatinine today.   #4  mixed hyperlipidemia. LDL was 53 about 3 mo ago.  Continue rosuvastatin  40 mg a day and continue fenofibrate  145 mg a day. Lipid panel today.  #5 recurrent major depressive disorder and GAD. Doing well. Continue fluoxetine  40 mg a day, duloxetine  30 mg a day, and diazepam  10 mg twice daily.  An After Visit Summary was printed and given to the patient.  FOLLOW UP: Return in about 3 months (around 06/17/2024) for routine chronic illness f/u.  Signed:  Gerlene Hockey, MD           03/17/2024

## 2024-03-18 ENCOUNTER — Other Ambulatory Visit: Payer: Self-pay | Admitting: Family Medicine

## 2024-03-19 ENCOUNTER — Ambulatory Visit: Payer: Self-pay | Admitting: Family Medicine

## 2024-03-31 ENCOUNTER — Other Ambulatory Visit: Payer: Self-pay

## 2024-03-31 MED ORDER — VITAMIN B-12 1000 MCG PO TABS: 1000.0000 ug | ORAL_TABLET | Freq: Every day | ORAL | 5 refills | Status: AC

## 2024-04-25 ENCOUNTER — Encounter: Payer: Self-pay | Admitting: Cardiovascular Disease

## 2024-05-01 ENCOUNTER — Other Ambulatory Visit: Payer: Self-pay | Admitting: Family Medicine

## 2024-05-30 ENCOUNTER — Other Ambulatory Visit: Payer: Self-pay | Admitting: Family Medicine

## 2024-06-03 ENCOUNTER — Other Ambulatory Visit: Payer: Self-pay | Admitting: Family Medicine

## 2024-06-06 ENCOUNTER — Encounter: Payer: Self-pay | Admitting: Family Medicine

## 2024-06-09 NOTE — Telephone Encounter (Signed)
 Pls schedule [place] appt with me

## 2024-06-10 NOTE — Telephone Encounter (Signed)
Pt is scheduled, no further action needed.

## 2024-06-17 ENCOUNTER — Ambulatory Visit: Admitting: Family Medicine

## 2024-06-17 DIAGNOSIS — E1121 Type 2 diabetes mellitus with diabetic nephropathy: Secondary | ICD-10-CM

## 2024-06-19 ENCOUNTER — Ambulatory Visit: Admitting: Family Medicine

## 2024-06-19 NOTE — Progress Notes (Unsigned)
 OFFICE VISIT  06/19/2024  CC: No chief complaint on file.   Patient is a 59 y.o. male who presents for concern of something in his hair/scalp 69-month follow-up diabetes, hypertension, and hyperlipidemia. A/P as of last visit:  diabetes without complication.  Poor control. POC Hba1c today is 8.5%. Feet exam today normal. Increase Toujeo  to 20 units a day. Continue Trulicity  4.5 mg subcu weekly, Jardiance  25 mg daily, and metformin  1000 mg twice daily. He will work harder on a diabetic diet. Check renal function and urine microalbumin/creatinine today.   2. hypertension, well-controlled on losartan  one half of 100 mg tab daily, 12.5 mg HCTZ daily. Electrolytes and creatinine today.   3.  Chronic renal insufficiency stage III. Avoid NSAIDs. Electrolytes and creatinine today.   #4  mixed hyperlipidemia. LDL was 53 about 3 mo ago.  Continue rosuvastatin  40 mg a day and continue fenofibrate  145 mg a day. Lipid panel today.   #5 recurrent major depressive disorder and GAD. Doing well. Continue fluoxetine  40 mg a day, duloxetine  30 mg a day, and diazepam  10 mg twice daily.  HPI: ***   Past Medical History:  Diagnosis Date   ANEMIA, PERNICIOUS 04/03/2007   Anxiety and depression    Arthritis    left hand (06/26/2016)   Chronic lower back pain    grd I degen spondylolisthesis at L4-5 w/associated synovial cyst at facet at this level +underlying severe epidural lipomatosis L5-S1 up to L4-5--plan for surg w/Dr. Burnetta 12/2020.   Chronic renal insufficiency, stage 3 (moderate) 2022   GFR around 60   Colon cancer screening 10/27/2016   10/2016 and 04/2021 NEG   CORONARY ARTERY DISEASE 12/05/2006   DIABETES MELLITUS, TYPE II dx'd 2001   DVT (deep venous thrombosis) (HCC) 2016   BLE   GERD (gastroesophageal reflux disease)    Hepatic steatosis    History of balanitis    x 2. Holding off on circ as of 05/2020 urol f/u   History of blood transfusion 2001   when I had my hand  OR   History of gout    History of kidney stones    HYPERCHOLESTEROLEMIA 07/17/2007   HYPERTENSION 12/05/2006   Learning disability    Migraine    1-2/month (06/26/2016)   Myocardial infarction (HCC) ~ 2003   Obesity, Class II, BMI 35-39.9    OSA on CPAP    Pt cannot recall setting clearly but thinks it is 5 cm h20.     PE (pulmonary embolism) 2016   Peripheral vascular disease    Simple renal cyst    X 2 on R kidney (one 5 cm and one 2 cm)   Stroke (HCC) ~ 2006   denies residual on 06/26/2016   TIA (transient ischemic attack) ?06/25/2016   2018 and 01/2021 (MRI brain, MR angio head and neck neg 2022)   VISUAL ACUITY, DECREASED, RIGHT EYE 02/19/2009    Past Surgical History:  Procedure Laterality Date   CARDIAC CATHETERIZATION  1990s; 2012; 06/2016   06/2016 distal LAD DES stent.  EF 55-65%   CARDIOVASCULAR STRESS TEST  06/2016   Echo stress test->inconclusive-->cath was done after this   CATARACT EXTRACTION W/ INTRAOCULAR LENS IMPLANT Right 2017   CORONARY PRESSURE/FFR STUDY N/A 06/27/2016   Procedure: Intravascular Pressure Wire/FFR Study;  Surgeon: Alm LELON Clay, MD;  Location: [facility] INVASIVE CV LAB;  Service: Cardiovascular;  Laterality: N/A;   CORONARY STENT INTERVENTION N/A 06/27/2016   Procedure: Coronary Stent Intervention;  Surgeon: Alm LELON  Anner, MD;  Location: [facility] INVASIVE CV LAB;  Service: Cardiovascular;  Laterality: N/A;   EYE SURGERY Right    Cataract   INGUINAL HERNIA REPAIR Bilateral 1990s   LEFT HEART CATH AND CORONARY ANGIOGRAPHY N/A 06/27/2016   Procedure: Left Heart Cath and Coronary Angiography;  Surgeon: Alm LELON Anner, MD;  Location: [facility] INVASIVE CV LAB;  Service: Cardiovascular;  Laterality: N/A;   LUMBAR LAMINECTOMY/DECOMPRESSION MICRODISCECTOMY Left 01/05/2021   Procedure: Left L4-5 Gill Decompression;  Surgeon: Burnetta Aures, MD;  Location: [facility] OR;  Service: Orthopedics;  Laterality: Left;   PET-CT, cardiac     05/2023 LOW RISK   REATTACHMENT HAND  Left ~ 2001   w/carpal tunnel release   UMBILICAL HERNIA REPAIR  1990s   w/IHR    Outpatient Medications Prior to Visit  Medication Sig Dispense Refill   Accu-Chek Softclix Lancets lancets Contact office to schedule with new provider 100 each 2   bromocriptine  (PARLODEL ) 2.5 MG tablet Take 1.25 mg by mouth daily.     clopidogrel  (PLAVIX ) 75 MG tablet TAKE 1 TABLET(75 MG) BY MOUTH DAILY 90 tablet 1   cyanocobalamin  (VITAMIN B12) 1000 MCG tablet Take 1 tablet (1,000 mcg total) by mouth daily. 30 tablet 5   diazepam  (VALIUM ) 10 MG tablet Take 1 tablet (10 mg total) by mouth in the morning and at bedtime. 180 tablet 1   DROPLET PEN NEEDLES 31G X 8 MM MISC USE TO INJECT TOUJEO  EVERY DAY 100 each 5   Dulaglutide  (TRULICITY ) 4.5 MG/0.5ML SOAJ Inject 4.5 mg as directed once a week. 6 mL 3   DULoxetine  (CYMBALTA ) 30 MG capsule Take 1 capsule (30 mg total) by mouth daily. 90 capsule 3   empagliflozin  (JARDIANCE ) 25 MG TABS tablet Take 1 tablet (25 mg total) by mouth daily. 90 tablet 3   EPINEPHrine  0.3 mg/0.3 mL IJ SOAJ injection Inject 0.3 mg into the muscle as needed for anaphylaxis. 2 each 1   fenofibrate  (TRICOR ) 145 MG tablet TAKE 1 TABLET(145 MG) BY MOUTH DAILY 30 tablet 0   fluconazole  (DIFLUCAN ) 150 MG tablet TAKE 1 TABLET BY MOUTH EVERY 7 DAYS (Patient not taking: Reported on 12/14/2023) 4 tablet 1   FLUoxetine  (PROZAC ) 40 MG capsule Take 1 capsule (40 mg total) by mouth daily. 90 capsule 3   fluticasone  (FLONASE ) 50 MCG/ACT nasal spray USE ONE SPRAY IN EACH NOSTRIL TWICE A DAY 48 g 3   gabapentin  (NEURONTIN ) 300 MG capsule Take 1 capsule (300 mg total) by mouth 2 (two) times daily. 180 capsule 1   glucose blood (ACCU-CHEK GUIDE) test strip Use to check glucose 2 times a day. 100 each 5   hydrochlorothiazide  (HYDRODIURIL ) 12.5 MG tablet TAKE 1 TABLET BY MOUTH EVERY DAY WITH THE LOSARTAN  90 tablet 3   insulin  glargine, 1 Unit Dial, (TOUJEO  SOLOSTAR) 300 UNIT/ML Solostar Pen INJECT 10 UNITS UNDER  THE SKIN NIGHTLY 6 mL 0   losartan  (COZAAR ) 100 MG tablet TAKE 1/2 TABLET(50 MG) BY MOUTH DAILY 45 tablet 0   metFORMIN  (GLUCOPHAGE -XR) 500 MG 24 hr tablet TAKE 2 TABLETS(1000 MG) BY MOUTH TWICE DAILY 180 tablet 0   methocarbamol  (ROBAXIN ) 500 MG tablet Take 1 tablet (500 mg total) by mouth 2 (two) times daily. 90 tablet 1   mupirocin  ointment (BACTROBAN ) 2 % Apply 1 Application topically 3 (three) times daily. 22 g 0   nitroGLYCERIN  (NITROSTAT ) 0.4 MG SL tablet DISSOLVE ONE TABLET UNDER TONGUE AS NEEDED FOR CHEST PAIN EVERY 5 MINUTES 25 tablet 3  nystatin  cream (MYCOSTATIN ) Apply 1 application topically 2 (two) times daily. 30 g 1   ondansetron  (ZOFRAN ) 4 MG tablet Take 1 tablet (4 mg total) by mouth every 8 (eight) hours as needed for nausea or vomiting. (Patient not taking: Reported on 12/14/2023) 20 tablet 0   rivaroxaban  (XARELTO ) 20 MG TABS tablet TAKE 1 TABLET BY MOUTH EVERY DAY BEFORE SUPPER 90 tablet 3   rosuvastatin  (CRESTOR ) 20 MG tablet Take 1 tablet (20 mg total) by mouth daily. Take 1 tablet (20 mg total) by mouth daily. 90 tablet 3   No facility-administered medications prior to visit.    Allergies[1]  Review of Systems  As per HPI  PE:    03/17/2024    9:38 AM 12/14/2023   10:25 AM 10/11/2023    1:06 PM  Vitals with BMI  Height 5' 11 5' 11   Weight 248 lbs 13 oz 245 lbs 6 oz 243 lbs 2 oz  BMI 34.72 34.24 33.92  Systolic 124 128 881  Diastolic 77 80 81  Pulse 64 83 83     Physical Exam  ***  LABS:  Last CBC Lab Results  Component Value Date   WBC 5.8 04/14/2022   HGB 14.6 04/14/2022   HCT 45.3 04/14/2022   MCV 93.9 04/14/2022   MCH 31.1 01/26/2021   RDW 13.7 04/14/2022   PLT 387.0 04/14/2022   Last metabolic panel Lab Results  Component Value Date   GLUCOSE 194 (H) 03/17/2024   NA 140 03/17/2024   K 3.9 03/17/2024   CL 101 03/17/2024   CO2 28 03/17/2024   BUN 17 03/17/2024   CREATININE 1.13 03/17/2024   GFR 72.04 03/17/2024   CALCIUM  9.5  03/17/2024   PHOS 4.0 06/23/2015   PROT 7.7 12/14/2023   ALBUMIN  4.8 12/14/2023   BILITOT 0.6 12/14/2023   ALKPHOS 43 12/14/2023   AST 19 12/14/2023   ALT 18 12/14/2023   ANIONGAP 9 01/26/2021   Last lipids Lab Results  Component Value Date   CHOL 162 03/17/2024   HDL 62.40 03/17/2024   LDLCALC 61 03/17/2024   LDLDIRECT 58.0 04/29/2021   TRIG 191.0 (H) 03/17/2024   CHOLHDL 3 03/17/2024   Last hemoglobin A1c Lab Results  Component Value Date   HGBA1C 8.5 (A) 03/17/2024   HGBA1C 8.5 03/17/2024   HGBA1C 8.5 (A) 03/17/2024   HGBA1C 8.5 (A) 03/17/2024   Last thyroid  functions Lab Results  Component Value Date   TSH 1.51 03/15/2021   FREET4 0.99 03/15/2021   Lab Results  Component Value Date   PSA 0.28 08/28/2023   PSA 0.35 04/29/2021   PSA 0.18 01/20/2020   Last vitamin B12 and Folate Lab Results  Component Value Date   VITAMINB12 446 04/29/2021   FOLATE 13.6 06/12/2008   IMPRESSION AND PLAN:  No problem-specific Assessment & Plan notes found for this encounter.   An After Visit Summary was printed and given to the patient.  FOLLOW UP: No follow-ups on file.  Signed:  Gerlene Hockey, MD           06/19/2024     [1]  Allergies Allergen Reactions   Actos  [Pioglitazone ] Other (See Comments)    Potential cause of DVT   Bee Venom Anaphylaxis   Klonopin [Clonazepam] Anaphylaxis   Invokana  [Canagliflozin ] Nausea Only    Nausea/dizzy/bad taste in mouth   Niacin Other (See Comments)    makes his crazy

## 2024-06-27 ENCOUNTER — Ambulatory Visit: Admitting: Family Medicine

## 2024-07-30 ENCOUNTER — Ambulatory Visit
